# Patient Record
Sex: Female | Born: 1965 | Race: White | Hispanic: No | Smoking: Former smoker
Health system: Southern US, Community
[De-identification: ages and names within clinical notes are randomized; demographics above are authoritative.]

## PROBLEM LIST (undated history)

## (undated) DIAGNOSIS — Z5189 Encounter for other specified aftercare: Secondary | ICD-10-CM

## (undated) DIAGNOSIS — K7460 Unspecified cirrhosis of liver: Secondary | ICD-10-CM

## (undated) DIAGNOSIS — I4891 Unspecified atrial fibrillation: Secondary | ICD-10-CM

## (undated) DIAGNOSIS — E785 Hyperlipidemia, unspecified: Secondary | ICD-10-CM

## (undated) DIAGNOSIS — I499 Cardiac arrhythmia, unspecified: Secondary | ICD-10-CM

## (undated) DIAGNOSIS — D508 Other iron deficiency anemias: Secondary | ICD-10-CM

## (undated) DIAGNOSIS — I719 Aortic aneurysm of unspecified site, without rupture: Secondary | ICD-10-CM

## (undated) DIAGNOSIS — C6920 Malignant neoplasm of unspecified retina: Secondary | ICD-10-CM

## (undated) DIAGNOSIS — K3184 Gastroparesis: Secondary | ICD-10-CM

## (undated) DIAGNOSIS — K754 Autoimmune hepatitis: Secondary | ICD-10-CM

## (undated) DIAGNOSIS — E063 Autoimmune thyroiditis: Secondary | ICD-10-CM

## (undated) DIAGNOSIS — E079 Disorder of thyroid, unspecified: Secondary | ICD-10-CM

## (undated) DIAGNOSIS — H359 Unspecified retinal disorder: Secondary | ICD-10-CM

## (undated) DIAGNOSIS — E039 Hypothyroidism, unspecified: Secondary | ICD-10-CM

## (undated) DIAGNOSIS — D539 Nutritional anemia, unspecified: Secondary | ICD-10-CM

## (undated) DIAGNOSIS — G8929 Other chronic pain: Secondary | ICD-10-CM

## (undated) DIAGNOSIS — M797 Fibromyalgia: Secondary | ICD-10-CM

## (undated) DIAGNOSIS — M549 Dorsalgia, unspecified: Secondary | ICD-10-CM

## (undated) DIAGNOSIS — M1990 Unspecified osteoarthritis, unspecified site: Secondary | ICD-10-CM

## (undated) DIAGNOSIS — K760 Fatty (change of) liver, not elsewhere classified: Secondary | ICD-10-CM

## (undated) DIAGNOSIS — I10 Essential (primary) hypertension: Secondary | ICD-10-CM

## (undated) DIAGNOSIS — H5440 Blindness, one eye, unspecified eye: Secondary | ICD-10-CM

## (undated) DIAGNOSIS — M2550 Pain in unspecified joint: Secondary | ICD-10-CM

## (undated) DIAGNOSIS — A4901 Methicillin susceptible Staphylococcus aureus infection, unspecified site: Secondary | ICD-10-CM

## (undated) DIAGNOSIS — D510 Vitamin B12 deficiency anemia due to intrinsic factor deficiency: Secondary | ICD-10-CM

## (undated) DIAGNOSIS — S8990XA Unspecified injury of unspecified lower leg, initial encounter: Secondary | ICD-10-CM

## (undated) HISTORY — DX: Fatty (change of) liver, not elsewhere classified: K76.0

## (undated) HISTORY — DX: Autoimmune hepatitis: K75.4

## (undated) HISTORY — DX: Hyperlipidemia, unspecified: E78.5

## (undated) HISTORY — PX: COLONOSCOPY: SHX174

## (undated) HISTORY — DX: Aortic aneurysm of unspecified site, without rupture: I71.9

## (undated) HISTORY — DX: Unspecified cirrhosis of liver: K74.60

## (undated) HISTORY — PX: EYE SURGERY: SHX253

## (undated) HISTORY — DX: Unspecified atrial fibrillation: I48.91

## (undated) HISTORY — DX: Encounter for other specified aftercare: Z51.89

## (undated) HISTORY — DX: Autoimmune thyroiditis: E06.3

## (undated) HISTORY — DX: Malignant neoplasm of unspecified retina: C69.20

## (undated) HISTORY — DX: Unspecified injury of unspecified lower leg, initial encounter: S89.90XA

## (undated) HISTORY — PX: SPINE SURGERY: SHX786

## (undated) HISTORY — DX: Blindness, one eye, unspecified eye: H54.40

## (undated) HISTORY — DX: Vitamin B12 deficiency anemia due to intrinsic factor deficiency: D51.0

## (undated) HISTORY — DX: Essential (primary) hypertension: I10

## (undated) HISTORY — DX: Disorder of thyroid, unspecified: E07.9

## (undated) HISTORY — DX: Unspecified osteoarthritis, unspecified site: M19.90

## (undated) HISTORY — DX: Gastroparesis: K31.84

## (undated) HISTORY — PX: ESOPHAGOGASTRODUODENOSCOPY: SHX1529

## (undated) HISTORY — PX: LIVER BIOPSY: SHX301

## (undated) HISTORY — DX: Other iron deficiency anemias: D50.8

## (undated) HISTORY — DX: Nutritional anemia, unspecified: D53.9

## (undated) HISTORY — DX: Methicillin susceptible Staphylococcus aureus infection, unspecified site: A49.01

## (undated) HISTORY — DX: Cardiac arrhythmia, unspecified: I49.9

---
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
## 1984-01-14 HISTORY — PX: TUBAL LIGATION: SHX77

## 1987-01-14 HISTORY — PX: CHOLECYSTECTOMY: SHX55

## 1990-01-13 HISTORY — PX: DIAGNOSTIC LAPAROSCOPY: SUR761

## 1991-01-14 HISTORY — PX: EXPLORATORY LAPAROTOMY: SUR591

## 1991-01-14 HISTORY — PX: OTHER SURGICAL HISTORY: SHX169

## 1998-12-08 ENCOUNTER — Emergency Department (HOSPITAL_COMMUNITY): Admission: EM | Admit: 1998-12-08 | Discharge: 1998-12-08 | Payer: Self-pay | Admitting: Emergency Medicine

## 1998-12-08 ENCOUNTER — Encounter: Payer: Self-pay | Admitting: Emergency Medicine

## 1999-11-11 ENCOUNTER — Emergency Department (HOSPITAL_COMMUNITY): Admission: EM | Admit: 1999-11-11 | Discharge: 1999-11-11 | Payer: Self-pay | Admitting: Emergency Medicine

## 1999-12-02 ENCOUNTER — Ambulatory Visit (HOSPITAL_COMMUNITY): Admission: RE | Admit: 1999-12-02 | Discharge: 1999-12-02 | Payer: Self-pay | Admitting: Family Medicine

## 1999-12-02 ENCOUNTER — Encounter: Payer: Self-pay | Admitting: Family Medicine

## 2001-08-03 ENCOUNTER — Ambulatory Visit (HOSPITAL_COMMUNITY): Admission: RE | Admit: 2001-08-03 | Discharge: 2001-08-03 | Payer: Self-pay | Admitting: Family Medicine

## 2001-08-03 ENCOUNTER — Encounter: Payer: Self-pay | Admitting: Family Medicine

## 2001-08-30 ENCOUNTER — Ambulatory Visit (HOSPITAL_COMMUNITY): Admission: RE | Admit: 2001-08-30 | Discharge: 2001-08-30 | Payer: Self-pay | Admitting: Family Medicine

## 2001-08-30 ENCOUNTER — Encounter: Payer: Self-pay | Admitting: Family Medicine

## 2001-09-27 ENCOUNTER — Ambulatory Visit (HOSPITAL_COMMUNITY): Admission: RE | Admit: 2001-09-27 | Discharge: 2001-09-27 | Payer: Self-pay | Admitting: *Deleted

## 2001-09-27 ENCOUNTER — Encounter: Payer: Self-pay | Admitting: *Deleted

## 2001-11-10 ENCOUNTER — Ambulatory Visit (HOSPITAL_COMMUNITY): Admission: RE | Admit: 2001-11-10 | Discharge: 2001-11-10 | Payer: Self-pay | Admitting: Family Medicine

## 2001-11-10 ENCOUNTER — Encounter: Payer: Self-pay | Admitting: Family Medicine

## 2002-01-30 ENCOUNTER — Inpatient Hospital Stay (HOSPITAL_COMMUNITY): Admission: AD | Admit: 2002-01-30 | Discharge: 2002-01-30 | Payer: Self-pay

## 2002-02-04 ENCOUNTER — Ambulatory Visit (HOSPITAL_COMMUNITY): Admission: RE | Admit: 2002-02-04 | Discharge: 2002-02-04 | Payer: Self-pay

## 2003-11-23 ENCOUNTER — Emergency Department (HOSPITAL_COMMUNITY): Admission: EM | Admit: 2003-11-23 | Discharge: 2003-11-23 | Payer: Self-pay | Admitting: Emergency Medicine

## 2003-12-14 ENCOUNTER — Encounter: Admission: RE | Admit: 2003-12-14 | Discharge: 2003-12-14 | Payer: Self-pay | Admitting: Orthopedic Surgery

## 2004-01-14 DIAGNOSIS — A4901 Methicillin susceptible Staphylococcus aureus infection, unspecified site: Secondary | ICD-10-CM

## 2004-01-14 DIAGNOSIS — S8990XA Unspecified injury of unspecified lower leg, initial encounter: Secondary | ICD-10-CM | POA: Insufficient documentation

## 2004-01-14 HISTORY — PX: CARDIAC CATHETERIZATION: SHX172

## 2004-01-14 HISTORY — DX: Methicillin susceptible Staphylococcus aureus infection, unspecified site: A49.01

## 2004-01-14 HISTORY — DX: Unspecified injury of unspecified lower leg, initial encounter: S89.90XA

## 2004-01-20 ENCOUNTER — Encounter: Admission: RE | Admit: 2004-01-20 | Discharge: 2004-01-20 | Payer: Self-pay | Admitting: Neurosurgery

## 2004-01-31 ENCOUNTER — Encounter: Admission: RE | Admit: 2004-01-31 | Discharge: 2004-01-31 | Payer: Self-pay | Admitting: Gastroenterology

## 2004-01-31 ENCOUNTER — Encounter: Payer: Self-pay | Admitting: Family

## 2004-02-01 ENCOUNTER — Encounter (INDEPENDENT_AMBULATORY_CARE_PROVIDER_SITE_OTHER): Payer: Self-pay | Admitting: *Deleted

## 2004-02-01 ENCOUNTER — Ambulatory Visit (HOSPITAL_COMMUNITY): Admission: RE | Admit: 2004-02-01 | Discharge: 2004-02-01 | Payer: Self-pay | Admitting: Gastroenterology

## 2004-02-08 ENCOUNTER — Ambulatory Visit (HOSPITAL_COMMUNITY): Admission: RE | Admit: 2004-02-08 | Discharge: 2004-02-08 | Payer: Self-pay | Admitting: Gastroenterology

## 2004-02-08 ENCOUNTER — Encounter (INDEPENDENT_AMBULATORY_CARE_PROVIDER_SITE_OTHER): Payer: Self-pay | Admitting: Specialist

## 2004-02-12 ENCOUNTER — Inpatient Hospital Stay (HOSPITAL_COMMUNITY): Admission: RE | Admit: 2004-02-12 | Discharge: 2004-02-16 | Payer: Self-pay | Admitting: Neurosurgery

## 2004-03-12 ENCOUNTER — Ambulatory Visit: Payer: Self-pay | Admitting: Infectious Diseases

## 2004-03-12 ENCOUNTER — Inpatient Hospital Stay (HOSPITAL_COMMUNITY): Admission: AD | Admit: 2004-03-12 | Discharge: 2004-03-15 | Payer: Self-pay | Admitting: Neurosurgery

## 2004-03-17 ENCOUNTER — Emergency Department (HOSPITAL_COMMUNITY): Admission: EM | Admit: 2004-03-17 | Discharge: 2004-03-17 | Payer: Self-pay | Admitting: Emergency Medicine

## 2004-05-14 ENCOUNTER — Encounter: Admission: RE | Admit: 2004-05-14 | Discharge: 2004-05-14 | Payer: Self-pay | Admitting: Gastroenterology

## 2004-09-03 ENCOUNTER — Ambulatory Visit (HOSPITAL_COMMUNITY): Admission: RE | Admit: 2004-09-03 | Discharge: 2004-09-03 | Payer: Self-pay | Admitting: Neurosurgery

## 2004-09-13 ENCOUNTER — Ambulatory Visit (HOSPITAL_COMMUNITY): Admission: RE | Admit: 2004-09-13 | Discharge: 2004-09-13 | Payer: Self-pay | Admitting: Neurosurgery

## 2004-10-22 ENCOUNTER — Ambulatory Visit (HOSPITAL_COMMUNITY): Admission: RE | Admit: 2004-10-22 | Discharge: 2004-10-22 | Payer: Self-pay | Admitting: Neurosurgery

## 2004-12-02 ENCOUNTER — Encounter: Admission: RE | Admit: 2004-12-02 | Discharge: 2004-12-02 | Payer: Self-pay | Admitting: Orthopedic Surgery

## 2005-01-27 ENCOUNTER — Encounter: Payer: Self-pay | Admitting: Family

## 2005-02-25 ENCOUNTER — Ambulatory Visit (HOSPITAL_COMMUNITY): Admission: RE | Admit: 2005-02-25 | Discharge: 2005-02-25 | Payer: Self-pay | Admitting: Gastroenterology

## 2005-02-25 ENCOUNTER — Encounter (INDEPENDENT_AMBULATORY_CARE_PROVIDER_SITE_OTHER): Payer: Self-pay | Admitting: Specialist

## 2005-05-01 ENCOUNTER — Encounter: Payer: Self-pay | Admitting: Family

## 2005-05-01 ENCOUNTER — Observation Stay (HOSPITAL_COMMUNITY): Admission: EM | Admit: 2005-05-01 | Discharge: 2005-05-03 | Payer: Self-pay | Admitting: Gastroenterology

## 2005-05-03 ENCOUNTER — Encounter (INDEPENDENT_AMBULATORY_CARE_PROVIDER_SITE_OTHER): Payer: Self-pay | Admitting: Cardiology

## 2005-05-15 ENCOUNTER — Ambulatory Visit (HOSPITAL_COMMUNITY): Admission: RE | Admit: 2005-05-15 | Discharge: 2005-05-15 | Payer: Self-pay | Admitting: Gastroenterology

## 2005-06-02 ENCOUNTER — Encounter: Admission: RE | Admit: 2005-06-02 | Discharge: 2005-06-02 | Payer: Self-pay | Admitting: Gastroenterology

## 2005-07-13 HISTORY — PX: CARDIAC CATHETERIZATION: SHX172

## 2005-09-12 ENCOUNTER — Emergency Department (HOSPITAL_COMMUNITY): Admission: EM | Admit: 2005-09-12 | Discharge: 2005-09-12 | Payer: Self-pay | Admitting: Emergency Medicine

## 2006-02-10 ENCOUNTER — Encounter: Admission: RE | Admit: 2006-02-10 | Discharge: 2006-02-10 | Payer: Self-pay | Admitting: Neurosurgery

## 2006-06-30 LAB — HM COLONOSCOPY: HM Colonoscopy: NORMAL

## 2006-07-18 ENCOUNTER — Emergency Department (HOSPITAL_COMMUNITY): Admission: EM | Admit: 2006-07-18 | Discharge: 2006-07-19 | Payer: Self-pay | Admitting: Emergency Medicine

## 2006-09-16 ENCOUNTER — Encounter: Payer: Self-pay | Admitting: Family

## 2006-09-16 LAB — CONVERTED CEMR LAB: Pap Smear: NORMAL

## 2006-09-24 ENCOUNTER — Encounter: Admission: RE | Admit: 2006-09-24 | Discharge: 2006-09-24 | Payer: Self-pay | Admitting: Neurosurgery

## 2006-12-20 ENCOUNTER — Emergency Department (HOSPITAL_COMMUNITY): Admission: EM | Admit: 2006-12-20 | Discharge: 2006-12-21 | Payer: Self-pay | Admitting: Emergency Medicine

## 2006-12-23 ENCOUNTER — Ambulatory Visit: Payer: Self-pay | Admitting: Hematology & Oncology

## 2007-03-12 ENCOUNTER — Ambulatory Visit: Payer: Self-pay | Admitting: Hematology & Oncology

## 2007-03-12 LAB — CBC & DIFF AND RETIC
BASO%: 0.3 % (ref 0.0–2.0)
Basophils Absolute: 0 10*3/uL (ref 0.0–0.1)
EOS%: 3.8 % (ref 0.0–7.0)
Eosinophils Absolute: 0.2 10*3/uL (ref 0.0–0.5)
HCT: 30.2 % — ABNORMAL LOW (ref 34.8–46.6)
HGB: 9.6 g/dL — ABNORMAL LOW (ref 11.6–15.9)
IRF: 0.3 (ref 0.130–0.330)
LYMPH%: 20.7 % (ref 14.0–48.0)
MCH: 22.7 pg — ABNORMAL LOW (ref 26.0–34.0)
MCHC: 31.9 g/dL — ABNORMAL LOW (ref 32.0–36.0)
MCV: 71.1 fL — ABNORMAL LOW (ref 81.0–101.0)
MONO#: 0.4 10*3/uL (ref 0.1–0.9)
MONO%: 6.7 % (ref 0.0–13.0)
NEUT#: 4.3 10*3/uL (ref 1.5–6.5)
NEUT%: 68.5 % (ref 39.6–76.8)
Platelets: 244 10*3/uL (ref 145–400)
RBC: 4.25 10*6/uL (ref 3.70–5.32)
RDW: 16.7 % — ABNORMAL HIGH (ref 11.3–14.5)
RETIC #: 93.1 10*3/uL (ref 19.7–115.1)
Retic %: 2.2 % (ref 0.4–2.3)
WBC: 6.2 10*3/uL (ref 3.9–10.0)
lymph#: 1.3 10*3/uL (ref 0.9–3.3)

## 2007-03-12 LAB — CHCC SMEAR

## 2007-03-16 LAB — COMPREHENSIVE METABOLIC PANEL
ALT: 28 U/L (ref 0–35)
AST: 31 U/L (ref 0–37)
Albumin: 3.6 g/dL (ref 3.5–5.2)
Alkaline Phosphatase: 106 U/L (ref 39–117)
BUN: 9 mg/dL (ref 6–23)
CO2: 26 mEq/L (ref 19–32)
Calcium: 8.7 mg/dL (ref 8.4–10.5)
Chloride: 99 mEq/L (ref 96–112)
Creatinine, Ser: 0.58 mg/dL (ref 0.40–1.20)
Glucose, Bld: 222 mg/dL — ABNORMAL HIGH (ref 70–99)
Potassium: 4.5 mEq/L (ref 3.5–5.3)
Sodium: 141 mEq/L (ref 135–145)
Total Bilirubin: 0.3 mg/dL (ref 0.3–1.2)
Total Protein: 6.7 g/dL (ref 6.0–8.3)

## 2007-03-16 LAB — FERRITIN: Ferritin: 4 ng/mL — ABNORMAL LOW (ref 10–291)

## 2007-03-16 LAB — TRANSFERRIN RECEPTOR, SOLUABLE: Transferrin Receptor, Soluble: 39.8 nmol/L

## 2007-03-18 ENCOUNTER — Encounter: Admission: RE | Admit: 2007-03-18 | Discharge: 2007-03-18 | Payer: Self-pay | Admitting: Neurosurgery

## 2007-04-19 ENCOUNTER — Ambulatory Visit: Payer: Self-pay | Admitting: Hematology & Oncology

## 2007-04-21 LAB — CBC & DIFF AND RETIC
BASO%: 0.5 % (ref 0.0–2.0)
Basophils Absolute: 0 10*3/uL (ref 0.0–0.1)
EOS%: 4.4 % (ref 0.0–7.0)
Eosinophils Absolute: 0.3 10*3/uL (ref 0.0–0.5)
HCT: 36.4 % (ref 34.8–46.6)
HGB: 12 g/dL (ref 11.6–15.9)
IRF: 0.42 — ABNORMAL HIGH (ref 0.130–0.330)
LYMPH%: 30.8 % (ref 14.0–48.0)
MCH: 25.4 pg — ABNORMAL LOW (ref 26.0–34.0)
MCHC: 33 g/dL (ref 32.0–36.0)
MCV: 77 fL — ABNORMAL LOW (ref 81.0–101.0)
MONO#: 0.4 10*3/uL (ref 0.1–0.9)
MONO%: 5.8 % (ref 0.0–13.0)
NEUT#: 4.2 10*3/uL (ref 1.5–6.5)
NEUT%: 58.5 % (ref 39.6–76.8)
Platelets: 199 10*3/uL (ref 145–400)
RBC: 4.73 10*6/uL (ref 3.70–5.32)
RDW: 23.6 % — ABNORMAL HIGH (ref 11.3–14.5)
RETIC #: 109.7 10*3/uL (ref 19.7–115.1)
Retic %: 2.3 % (ref 0.4–2.3)
WBC: 7.2 10*3/uL (ref 3.9–10.0)
lymph#: 2.2 10*3/uL (ref 0.9–3.3)

## 2007-04-21 LAB — CHCC SMEAR

## 2007-04-23 LAB — HEPATIC FUNCTION PANEL
ALT: 37 U/L — ABNORMAL HIGH (ref 0–35)
AST: 35 U/L (ref 0–37)
Albumin: 4 g/dL (ref 3.5–5.2)
Alkaline Phosphatase: 104 U/L (ref 39–117)
Bilirubin, Direct: 0.1 mg/dL (ref 0.0–0.3)
Indirect Bilirubin: 0.4 mg/dL (ref 0.0–0.9)
Total Bilirubin: 0.5 mg/dL (ref 0.3–1.2)
Total Protein: 7.2 g/dL (ref 6.0–8.3)

## 2007-04-23 LAB — TSH: TSH: 2.459 u[IU]/mL (ref 0.350–5.500)

## 2007-04-23 LAB — TRANSFERRIN RECEPTOR, SOLUABLE: Transferrin Receptor, Soluble: 27.4 nmol/L

## 2007-04-23 LAB — FERRITIN: Ferritin: 234 ng/mL (ref 10–291)

## 2007-06-08 ENCOUNTER — Ambulatory Visit: Payer: Self-pay | Admitting: Hematology & Oncology

## 2007-06-10 LAB — CBC & DIFF AND RETIC
BASO%: 0.4 % (ref 0.0–2.0)
Basophils Absolute: 0 10*3/uL (ref 0.0–0.1)
EOS%: 3.7 % (ref 0.0–7.0)
Eosinophils Absolute: 0.2 10*3/uL (ref 0.0–0.5)
HCT: 41.1 % (ref 34.8–46.6)
HGB: 13.7 g/dL (ref 11.6–15.9)
IRF: 0.33 (ref 0.130–0.330)
LYMPH%: 29.7 % (ref 14.0–48.0)
MCH: 26.9 pg (ref 26.0–34.0)
MCHC: 33.3 g/dL (ref 32.0–36.0)
MCV: 80.8 fL — ABNORMAL LOW (ref 81.0–101.0)
MONO#: 0.5 10*3/uL (ref 0.1–0.9)
MONO%: 7.7 % (ref 0.0–13.0)
NEUT#: 3.9 10*3/uL (ref 1.5–6.5)
NEUT%: 58.5 % (ref 39.6–76.8)
Platelets: 239 10*3/uL (ref 145–400)
RBC: 5.08 10*6/uL (ref 3.70–5.32)
RDW: 17.4 % — ABNORMAL HIGH (ref 11.3–14.5)
RETIC #: 90.4 10*3/uL (ref 19.7–115.1)
Retic %: 1.8 % (ref 0.4–2.3)
WBC: 6.7 10*3/uL (ref 3.9–10.0)
lymph#: 2 10*3/uL (ref 0.9–3.3)

## 2007-06-10 LAB — CHCC SMEAR

## 2007-06-12 LAB — TRANSFERRIN RECEPTOR, SOLUABLE: Transferrin Receptor, Soluble: 18.3 nmol/L

## 2007-06-12 LAB — FERRITIN: Ferritin: 159 ng/mL (ref 10–291)

## 2007-08-26 ENCOUNTER — Encounter: Admission: RE | Admit: 2007-08-26 | Discharge: 2007-08-26 | Payer: Self-pay | Admitting: Family Medicine

## 2007-10-07 ENCOUNTER — Ambulatory Visit: Payer: Self-pay | Admitting: Hematology & Oncology

## 2007-10-08 LAB — CBC WITH DIFFERENTIAL (CANCER CENTER ONLY)
BASO#: 0.1 10*3/uL (ref 0.0–0.2)
BASO%: 0.8 % (ref 0.0–2.0)
EOS%: 4.4 % (ref 0.0–7.0)
Eosinophils Absolute: 0.3 10*3/uL (ref 0.0–0.5)
HCT: 39.2 % (ref 34.8–46.6)
HGB: 13.2 g/dL (ref 11.6–15.9)
LYMPH#: 2.3 10*3/uL (ref 0.9–3.3)
LYMPH%: 31.3 % (ref 14.0–48.0)
MCH: 27.1 pg (ref 26.0–34.0)
MCHC: 33.6 g/dL (ref 32.0–36.0)
MCV: 80 fL — ABNORMAL LOW (ref 81–101)
MONO#: 0.3 10*3/uL (ref 0.1–0.9)
MONO%: 3.6 % (ref 0.0–13.0)
NEUT#: 4.3 10*3/uL (ref 1.5–6.5)
NEUT%: 59.9 % (ref 39.6–80.0)
Platelets: 238 10*3/uL (ref 145–400)
RBC: 4.88 10*6/uL (ref 3.70–5.32)
RDW: 14.1 % (ref 10.5–14.6)
WBC: 7.3 10*3/uL (ref 3.9–10.0)

## 2007-10-08 LAB — CHCC SATELLITE - SMEAR

## 2007-10-11 LAB — HEPATIC FUNCTION PANEL
ALT: 32 U/L (ref 0–35)
AST: 37 U/L (ref 0–37)
Albumin: 3.8 g/dL (ref 3.5–5.2)
Alkaline Phosphatase: 110 U/L (ref 39–117)
Bilirubin, Direct: 0.1 mg/dL (ref 0.0–0.3)
Indirect Bilirubin: 0.3 mg/dL (ref 0.0–0.9)
Total Bilirubin: 0.4 mg/dL (ref 0.3–1.2)
Total Protein: 6.8 g/dL (ref 6.0–8.3)

## 2007-10-11 LAB — RETICULOCYTES (CHCC)
ABS Retic: 59.5 10*3/uL (ref 19.0–186.0)
RBC.: 4.96 MIL/uL (ref 3.87–5.11)
Retic Ct Pct: 1.2 % (ref 0.4–3.1)

## 2007-10-11 LAB — TRANSFERRIN RECEPTOR, SOLUABLE: Transferrin Receptor, Soluble: 18.5 nmol/L

## 2007-10-11 LAB — FERRITIN: Ferritin: 55 ng/mL (ref 10–291)

## 2007-10-11 LAB — TSH: TSH: 1.493 u[IU]/mL (ref 0.350–4.500)

## 2007-10-29 ENCOUNTER — Encounter: Payer: Self-pay | Admitting: Family

## 2007-10-29 ENCOUNTER — Encounter: Admission: RE | Admit: 2007-10-29 | Discharge: 2007-10-29 | Payer: Self-pay | Admitting: Gastroenterology

## 2007-11-11 ENCOUNTER — Encounter: Admission: RE | Admit: 2007-11-11 | Discharge: 2007-11-11 | Payer: Self-pay | Admitting: Gastroenterology

## 2007-12-15 ENCOUNTER — Ambulatory Visit: Payer: Self-pay | Admitting: Hematology & Oncology

## 2008-01-14 HISTORY — PX: BREAST CYST EXCISION: SHX579

## 2008-01-20 LAB — CBC WITH DIFFERENTIAL (CANCER CENTER ONLY)
BASO#: 0 10*3/uL (ref 0.0–0.2)
BASO%: 0.6 % (ref 0.0–2.0)
EOS%: 3.7 % (ref 0.0–7.0)
Eosinophils Absolute: 0.3 10*3/uL (ref 0.0–0.5)
HCT: 40.1 % (ref 34.8–46.6)
HGB: 13.4 g/dL (ref 11.6–15.9)
LYMPH#: 2.2 10*3/uL (ref 0.9–3.3)
LYMPH%: 29 % (ref 14.0–48.0)
MCH: 27.6 pg (ref 26.0–34.0)
MCHC: 33.4 g/dL (ref 32.0–36.0)
MCV: 83 fL (ref 81–101)
MONO#: 0.4 10*3/uL (ref 0.1–0.9)
MONO%: 5.2 % (ref 0.0–13.0)
NEUT#: 4.6 10*3/uL (ref 1.5–6.5)
NEUT%: 61.5 % (ref 39.6–80.0)
Platelets: 256 10*3/uL (ref 145–400)
RBC: 4.86 10*6/uL (ref 3.70–5.32)
RDW: 13.4 % (ref 10.5–14.6)
WBC: 7.5 10*3/uL (ref 3.9–10.0)

## 2008-01-20 LAB — COMPREHENSIVE METABOLIC PANEL
ALT: 30 U/L (ref 0–35)
AST: 31 U/L (ref 0–37)
Albumin: 3.8 g/dL (ref 3.5–5.2)
Alkaline Phosphatase: 101 U/L (ref 39–117)
BUN: 7 mg/dL (ref 6–23)
CO2: 28 mEq/L (ref 19–32)
Calcium: 8.8 mg/dL (ref 8.4–10.5)
Chloride: 104 mEq/L (ref 96–112)
Creatinine, Ser: 0.61 mg/dL (ref 0.40–1.20)
Glucose, Bld: 147 mg/dL — ABNORMAL HIGH (ref 70–99)
Potassium: 4 mEq/L (ref 3.5–5.3)
Sodium: 140 mEq/L (ref 135–145)
Total Bilirubin: 0.4 mg/dL (ref 0.3–1.2)
Total Protein: 6.6 g/dL (ref 6.0–8.3)

## 2008-01-20 LAB — FERRITIN: Ferritin: 15 ng/mL (ref 10–291)

## 2008-01-20 LAB — CHCC SATELLITE - SMEAR

## 2008-03-29 ENCOUNTER — Ambulatory Visit: Payer: Self-pay | Admitting: Hematology & Oncology

## 2008-04-20 LAB — CHCC SATELLITE - SMEAR

## 2008-04-20 LAB — CBC WITH DIFFERENTIAL (CANCER CENTER ONLY)
BASO#: 0 10*3/uL (ref 0.0–0.2)
BASO%: 0.4 % (ref 0.0–2.0)
EOS%: 4.9 % (ref 0.0–7.0)
Eosinophils Absolute: 0.3 10*3/uL (ref 0.0–0.5)
HCT: 39.4 % (ref 34.8–46.6)
HGB: 12.8 g/dL (ref 11.6–15.9)
LYMPH#: 1.7 10*3/uL (ref 0.9–3.3)
LYMPH%: 25.7 % (ref 14.0–48.0)
MCH: 25.8 pg — ABNORMAL LOW (ref 26.0–34.0)
MCHC: 32.4 g/dL (ref 32.0–36.0)
MCV: 80 fL — ABNORMAL LOW (ref 81–101)
MONO#: 0.4 10*3/uL (ref 0.1–0.9)
MONO%: 5.3 % (ref 0.0–13.0)
NEUT#: 4.2 10*3/uL (ref 1.5–6.5)
NEUT%: 63.7 % (ref 39.6–80.0)
Platelets: 271 10*3/uL (ref 145–400)
RBC: 4.93 10*6/uL (ref 3.70–5.32)
RDW: 13.3 % (ref 10.5–14.6)
WBC: 6.6 10*3/uL (ref 3.9–10.0)

## 2008-04-20 LAB — RETICULOCYTES (CHCC)
ABS Retic: 61.5 10*3/uL (ref 19.0–186.0)
RBC.: 4.73 MIL/uL (ref 3.87–5.11)
Retic Ct Pct: 1.3 % (ref 0.4–3.1)

## 2008-04-20 LAB — FERRITIN: Ferritin: 9 ng/mL — ABNORMAL LOW (ref 10–291)

## 2008-06-06 ENCOUNTER — Encounter: Admission: RE | Admit: 2008-06-06 | Discharge: 2008-06-06 | Payer: Self-pay | Admitting: General Surgery

## 2008-06-08 ENCOUNTER — Encounter (INDEPENDENT_AMBULATORY_CARE_PROVIDER_SITE_OTHER): Payer: Self-pay | Admitting: General Surgery

## 2008-06-08 ENCOUNTER — Ambulatory Visit (HOSPITAL_COMMUNITY): Admission: RE | Admit: 2008-06-08 | Discharge: 2008-06-08 | Payer: Self-pay | Admitting: General Surgery

## 2008-07-19 ENCOUNTER — Ambulatory Visit: Payer: Self-pay | Admitting: Hematology & Oncology

## 2008-07-20 LAB — CBC WITH DIFFERENTIAL (CANCER CENTER ONLY)
BASO#: 0 10*3/uL (ref 0.0–0.2)
BASO%: 0.5 % (ref 0.0–2.0)
EOS%: 5 % (ref 0.0–7.0)
Eosinophils Absolute: 0.4 10*3/uL (ref 0.0–0.5)
HCT: 41.6 % (ref 34.8–46.6)
HGB: 13.5 g/dL (ref 11.6–15.9)
LYMPH#: 2.6 10*3/uL (ref 0.9–3.3)
LYMPH%: 31.3 % (ref 14.0–48.0)
MCH: 27.5 pg (ref 26.0–34.0)
MCHC: 32.5 g/dL (ref 32.0–36.0)
MCV: 85 fL (ref 81–101)
MONO#: 0.5 10*3/uL (ref 0.1–0.9)
MONO%: 5.7 % (ref 0.0–13.0)
NEUT#: 4.8 10*3/uL (ref 1.5–6.5)
NEUT%: 57.5 % (ref 39.6–80.0)
Platelets: 248 10*3/uL (ref 145–400)
RBC: 4.9 10*6/uL (ref 3.70–5.32)
RDW: 14.1 % (ref 10.5–14.6)
WBC: 8.4 10*3/uL (ref 3.9–10.0)

## 2008-07-20 LAB — CHCC SATELLITE - SMEAR

## 2008-07-23 LAB — FERRITIN: Ferritin: 40 ng/mL (ref 10–291)

## 2008-07-23 LAB — TRANSFERRIN RECEPTOR, SOLUABLE: Transferrin Receptor, Soluble: 14.6 nmol/L

## 2008-09-20 ENCOUNTER — Encounter: Payer: Self-pay | Admitting: Family

## 2008-10-17 ENCOUNTER — Ambulatory Visit: Payer: Self-pay | Admitting: Hematology & Oncology

## 2008-11-30 ENCOUNTER — Ambulatory Visit: Payer: Self-pay | Admitting: Hematology & Oncology

## 2009-02-20 ENCOUNTER — Ambulatory Visit: Payer: Self-pay | Admitting: Hematology & Oncology

## 2009-03-27 LAB — HM MAMMOGRAPHY: HM Mammogram: NORMAL

## 2009-08-02 ENCOUNTER — Ambulatory Visit: Payer: Self-pay | Admitting: Hematology & Oncology

## 2009-08-03 ENCOUNTER — Encounter: Payer: Self-pay | Admitting: Family

## 2009-08-09 ENCOUNTER — Encounter: Payer: Self-pay | Admitting: Family

## 2009-08-09 LAB — CBC WITH DIFFERENTIAL (CANCER CENTER ONLY)
BASO#: 0 10*3/uL (ref 0.0–0.2)
BASO%: 0.6 % (ref 0.0–2.0)
EOS%: 5.1 % (ref 0.0–7.0)
Eosinophils Absolute: 0.3 10*3/uL (ref 0.0–0.5)
HCT: 33.8 % — ABNORMAL LOW (ref 34.8–46.6)
HGB: 11.1 g/dL — ABNORMAL LOW (ref 11.6–15.9)
LYMPH#: 2.1 10*3/uL (ref 0.9–3.3)
LYMPH%: 35.6 % (ref 14.0–48.0)
MCH: 24.8 pg — ABNORMAL LOW (ref 26.0–34.0)
MCHC: 32.8 g/dL (ref 32.0–36.0)
MCV: 75 fL — ABNORMAL LOW (ref 81–101)
MONO#: 0.3 10*3/uL (ref 0.1–0.9)
MONO%: 5.3 % (ref 0.0–13.0)
NEUT#: 3.1 10*3/uL (ref 1.5–6.5)
NEUT%: 53.4 % (ref 39.6–80.0)
Platelets: 225 10*3/uL (ref 145–400)
RBC: 4.48 10*6/uL (ref 3.70–5.32)
RDW: 14.5 % (ref 10.5–14.6)
WBC: 5.8 10*3/uL (ref 3.9–10.0)

## 2009-08-09 LAB — BASIC METABOLIC PANEL
BUN: 8 mg/dL (ref 6–23)
CO2: 28 mEq/L (ref 19–32)
Calcium: 8.8 mg/dL (ref 8.4–10.5)
Chloride: 101 mEq/L (ref 96–112)
Creatinine, Ser: 0.64 mg/dL (ref 0.40–1.20)
Glucose, Bld: 107 mg/dL — ABNORMAL HIGH (ref 70–99)
Potassium: 4.2 mEq/L (ref 3.5–5.3)
Sodium: 138 mEq/L (ref 135–145)

## 2009-08-09 LAB — RETICULOCYTES (CHCC)
ABS Retic: 39.9 10*3/uL (ref 19.0–186.0)
RBC.: 4.43 MIL/uL (ref 3.87–5.11)
Retic Ct Pct: 0.9 % (ref 0.4–3.1)

## 2009-08-09 LAB — CHCC SATELLITE - SMEAR

## 2009-08-09 LAB — FERRITIN: Ferritin: 8 ng/mL — ABNORMAL LOW (ref 10–291)

## 2009-08-31 ENCOUNTER — Ambulatory Visit: Payer: Self-pay | Admitting: Family

## 2009-08-31 DIAGNOSIS — Z8719 Personal history of other diseases of the digestive system: Secondary | ICD-10-CM | POA: Insufficient documentation

## 2009-08-31 DIAGNOSIS — Z8679 Personal history of other diseases of the circulatory system: Secondary | ICD-10-CM

## 2009-08-31 DIAGNOSIS — I10 Essential (primary) hypertension: Secondary | ICD-10-CM | POA: Insufficient documentation

## 2009-08-31 DIAGNOSIS — M797 Fibromyalgia: Secondary | ICD-10-CM | POA: Insufficient documentation

## 2009-08-31 DIAGNOSIS — Z85840 Personal history of malignant neoplasm of eye: Secondary | ICD-10-CM | POA: Insufficient documentation

## 2009-08-31 DIAGNOSIS — C6920 Malignant neoplasm of unspecified retina: Secondary | ICD-10-CM

## 2009-08-31 DIAGNOSIS — R519 Headache, unspecified: Secondary | ICD-10-CM | POA: Insufficient documentation

## 2009-08-31 DIAGNOSIS — K754 Autoimmune hepatitis: Secondary | ICD-10-CM | POA: Insufficient documentation

## 2009-08-31 DIAGNOSIS — M539 Dorsopathy, unspecified: Secondary | ICD-10-CM | POA: Insufficient documentation

## 2009-08-31 DIAGNOSIS — K3184 Gastroparesis: Secondary | ICD-10-CM | POA: Insufficient documentation

## 2009-08-31 DIAGNOSIS — IMO0001 Reserved for inherently not codable concepts without codable children: Secondary | ICD-10-CM

## 2009-08-31 DIAGNOSIS — E785 Hyperlipidemia, unspecified: Secondary | ICD-10-CM | POA: Insufficient documentation

## 2009-08-31 DIAGNOSIS — R739 Hyperglycemia, unspecified: Secondary | ICD-10-CM | POA: Insufficient documentation

## 2009-08-31 DIAGNOSIS — D649 Anemia, unspecified: Secondary | ICD-10-CM | POA: Insufficient documentation

## 2009-08-31 DIAGNOSIS — M1990 Unspecified osteoarthritis, unspecified site: Secondary | ICD-10-CM

## 2009-08-31 DIAGNOSIS — R51 Headache: Secondary | ICD-10-CM | POA: Insufficient documentation

## 2009-08-31 DIAGNOSIS — E039 Hypothyroidism, unspecified: Secondary | ICD-10-CM | POA: Insufficient documentation

## 2009-08-31 HISTORY — DX: Anemia, unspecified: D64.9

## 2009-08-31 HISTORY — DX: Hyperlipidemia, unspecified: E78.5

## 2009-08-31 HISTORY — DX: Malignant neoplasm of unspecified retina: C69.20

## 2009-08-31 HISTORY — DX: Essential (primary) hypertension: I10

## 2009-08-31 HISTORY — DX: Hyperglycemia, unspecified: R73.9

## 2009-08-31 HISTORY — DX: Personal history of other diseases of the circulatory system: Z86.79

## 2009-08-31 HISTORY — DX: Unspecified osteoarthritis, unspecified site: M19.90

## 2009-08-31 LAB — CONVERTED CEMR LAB
ALT: 27 units/L (ref 0–35)
AST: 35 units/L (ref 0–37)
Albumin: 3.8 g/dL (ref 3.5–5.2)
Alkaline Phosphatase: 79 units/L (ref 39–117)
BUN: 7 mg/dL (ref 6–23)
Basophils Absolute: 0 10*3/uL (ref 0.0–0.1)
Basophils Relative: 0 % (ref 0–1)
Bilirubin, Direct: 0.1 mg/dL (ref 0.0–0.3)
CO2: 24 meq/L (ref 19–32)
Calcium: 8.8 mg/dL (ref 8.4–10.5)
Chloride: 104 meq/L (ref 96–112)
Creatinine, Ser: 0.62 mg/dL (ref 0.40–1.20)
Eosinophils Absolute: 0.4 10*3/uL (ref 0.0–0.7)
Eosinophils Relative: 5 % (ref 0–5)
Folate: 13.1 ng/mL
Glucose, Bld: 72 mg/dL (ref 70–99)
HCT: 35.4 % — ABNORMAL LOW (ref 36.0–46.0)
Hemoglobin: 11.4 g/dL — ABNORMAL LOW (ref 12.0–15.0)
Hgb A1c MFr Bld: 6 % — ABNORMAL HIGH (ref <5.7)
Indirect Bilirubin: 0.3 mg/dL (ref 0.0–0.9)
Lymphocytes Relative: 29 % (ref 12–46)
Lymphs Abs: 2.4 10*3/uL (ref 0.7–4.0)
MCHC: 32.2 g/dL (ref 30.0–36.0)
MCV: 76.8 fL — ABNORMAL LOW (ref 78.0–100.0)
Monocytes Absolute: 0.5 10*3/uL (ref 0.1–1.0)
Monocytes Relative: 6 % (ref 3–12)
Neutro Abs: 4.9 10*3/uL (ref 1.7–7.7)
Neutrophils Relative %: 61 % (ref 43–77)
Platelets: 205 10*3/uL (ref 150–400)
Potassium: 4.5 meq/L (ref 3.5–5.3)
RBC: 4.61 M/uL (ref 3.87–5.11)
RDW: 16.1 % — ABNORMAL HIGH (ref 11.5–15.5)
Sodium: 139 meq/L (ref 135–145)
TSH: 1.771 microintl units/mL (ref 0.350–4.500)
Total Bilirubin: 0.4 mg/dL (ref 0.3–1.2)
Total Protein: 6.8 g/dL (ref 6.0–8.3)
Vitamin B-12: 241 pg/mL (ref 211–911)
WBC: 8.1 10*3/uL (ref 4.0–10.5)

## 2009-09-03 ENCOUNTER — Telehealth: Payer: Self-pay | Admitting: Family

## 2009-09-03 ENCOUNTER — Encounter: Payer: Self-pay | Admitting: Family

## 2009-09-03 LAB — CONVERTED CEMR LAB
Ferritin: 194 ng/mL (ref 10–291)
Iron: 72 ug/dL (ref 42–145)
Saturation Ratios: 20 % (ref 20–55)
TIBC: 357 ug/dL (ref 250–470)
UIBC: 285 ug/dL

## 2009-09-04 ENCOUNTER — Encounter: Payer: Self-pay | Admitting: Family

## 2009-09-11 ENCOUNTER — Encounter: Payer: Self-pay | Admitting: Family

## 2009-09-11 DIAGNOSIS — Z9884 Bariatric surgery status: Secondary | ICD-10-CM

## 2009-09-11 HISTORY — DX: Bariatric surgery status: Z98.84

## 2009-09-13 ENCOUNTER — Telehealth: Payer: Self-pay | Admitting: Family

## 2009-09-14 ENCOUNTER — Telehealth: Payer: Self-pay | Admitting: Family

## 2009-09-14 ENCOUNTER — Encounter: Payer: Self-pay | Admitting: Family

## 2009-09-26 ENCOUNTER — Ambulatory Visit: Payer: Self-pay | Admitting: Family

## 2009-09-26 ENCOUNTER — Telehealth: Payer: Self-pay | Admitting: Family

## 2009-09-26 LAB — CONVERTED CEMR LAB: Fecal Occult Bld: POSITIVE

## 2009-10-03 ENCOUNTER — Encounter: Payer: Self-pay | Admitting: Family

## 2009-10-10 ENCOUNTER — Telehealth: Payer: Self-pay | Admitting: Family

## 2009-10-10 ENCOUNTER — Encounter: Payer: Self-pay | Admitting: Family

## 2009-10-10 DIAGNOSIS — G894 Chronic pain syndrome: Secondary | ICD-10-CM

## 2009-10-10 HISTORY — DX: Chronic pain syndrome: G89.4

## 2009-10-12 ENCOUNTER — Ambulatory Visit (HOSPITAL_COMMUNITY): Admission: RE | Admit: 2009-10-12 | Discharge: 2009-10-12 | Payer: Self-pay | Admitting: Neurosurgery

## 2009-10-15 ENCOUNTER — Ambulatory Visit: Payer: Self-pay | Admitting: Family

## 2009-10-15 ENCOUNTER — Encounter (INDEPENDENT_AMBULATORY_CARE_PROVIDER_SITE_OTHER): Payer: Self-pay | Admitting: *Deleted

## 2009-10-15 ENCOUNTER — Telehealth: Payer: Self-pay | Admitting: Family

## 2009-10-15 DIAGNOSIS — K921 Melena: Secondary | ICD-10-CM

## 2009-10-15 HISTORY — DX: Melena: K92.1

## 2009-10-16 ENCOUNTER — Ambulatory Visit: Payer: Self-pay | Admitting: Family

## 2009-10-16 LAB — CONVERTED CEMR LAB
Basophils Absolute: 0 10*3/uL (ref 0.0–0.1)
Basophils Relative: 0 % (ref 0–1)
Bilirubin Urine: NEGATIVE
Eosinophils Absolute: 0.4 10*3/uL (ref 0.0–0.7)
Eosinophils Relative: 6 % — ABNORMAL HIGH (ref 0–5)
Glucose, Urine, Semiquant: NEGATIVE
HCT: 37.8 % (ref 36.0–46.0)
Hemoglobin: 12.5 g/dL (ref 12.0–15.0)
Ketones, urine, test strip: NEGATIVE
Lymphocytes Relative: 33 % (ref 12–46)
Lymphs Abs: 2.4 10*3/uL (ref 0.7–4.0)
MCHC: 33.1 g/dL (ref 30.0–36.0)
MCV: 80.6 fL (ref 78.0–100.0)
Monocytes Absolute: 0.4 10*3/uL (ref 0.1–1.0)
Monocytes Relative: 6 % (ref 3–12)
Neutro Abs: 4.1 10*3/uL (ref 1.7–7.7)
Neutrophils Relative %: 55 % (ref 43–77)
Nitrite: NEGATIVE
Platelets: 231 10*3/uL (ref 150–400)
Protein, U semiquant: NEGATIVE
RBC: 4.69 M/uL (ref 3.87–5.11)
RDW: 16.6 % — ABNORMAL HIGH (ref 11.5–15.5)
Specific Gravity, Urine: <1.005
TSH: 3.03 microintl units/mL (ref 0.350–4.500)
Urobilinogen, UA: 0.2
WBC: 7.4 10*3/uL (ref 4.0–10.5)
pH: 6.5

## 2009-10-17 ENCOUNTER — Encounter: Payer: Self-pay | Admitting: Family

## 2009-10-19 ENCOUNTER — Telehealth: Payer: Self-pay | Admitting: Family

## 2009-10-22 ENCOUNTER — Encounter: Payer: Self-pay | Admitting: Family

## 2009-10-25 ENCOUNTER — Telehealth: Payer: Self-pay | Admitting: Family

## 2009-10-26 ENCOUNTER — Ambulatory Visit: Payer: Self-pay | Admitting: Family

## 2009-10-26 DIAGNOSIS — L538 Other specified erythematous conditions: Secondary | ICD-10-CM | POA: Insufficient documentation

## 2009-10-26 DIAGNOSIS — M79609 Pain in unspecified limb: Secondary | ICD-10-CM | POA: Insufficient documentation

## 2009-10-26 DIAGNOSIS — R079 Chest pain, unspecified: Secondary | ICD-10-CM | POA: Insufficient documentation

## 2009-10-26 LAB — CONVERTED CEMR LAB
ALT: 22 units/L (ref 0–35)
AST: 35 units/L (ref 0–37)
Albumin: 3.9 g/dL (ref 3.5–5.2)
Alkaline Phosphatase: 81 units/L (ref 39–117)
Basophils Absolute: 0 10*3/uL (ref 0.0–0.1)
Basophils Relative: 0 % (ref 0–1)
Bilirubin Urine: NEGATIVE
Bilirubin, Direct: 0.1 mg/dL (ref 0.0–0.3)
Eosinophils Absolute: 0.3 10*3/uL (ref 0.0–0.7)
Eosinophils Relative: 4 % (ref 0–5)
Glucose, Urine, Semiquant: NEGATIVE
HCT: 38.4 % (ref 36.0–46.0)
Hemoglobin: 12.4 g/dL (ref 12.0–15.0)
Indirect Bilirubin: 0.3 mg/dL (ref 0.0–0.9)
Ketones, urine, test strip: NEGATIVE
Lymphocytes Relative: 34 % (ref 12–46)
Lymphs Abs: 2.3 10*3/uL (ref 0.7–4.0)
MCHC: 32.3 g/dL (ref 30.0–36.0)
MCV: 82.1 fL (ref 78.0–100.0)
Monocytes Absolute: 0.5 10*3/uL (ref 0.1–1.0)
Monocytes Relative: 7 % (ref 3–12)
Neutro Abs: 3.8 10*3/uL (ref 1.7–7.7)
Neutrophils Relative %: 55 % (ref 43–77)
Nitrite: NEGATIVE
Platelets: 221 10*3/uL (ref 150–400)
RBC: 4.68 M/uL (ref 3.87–5.11)
RDW: 16.6 % — ABNORMAL HIGH (ref 11.5–15.5)
Sed Rate: 4 mm/hr (ref 0–22)
Specific Gravity, Urine: 1.01
Total Bilirubin: 0.4 mg/dL (ref 0.3–1.2)
Total Protein: 6.6 g/dL (ref 6.0–8.3)
Urobilinogen, UA: 0.2
WBC: 6.8 10*3/uL (ref 4.0–10.5)
pH: 6

## 2009-10-27 ENCOUNTER — Encounter: Payer: Self-pay | Admitting: Family

## 2009-10-29 ENCOUNTER — Encounter: Payer: Self-pay | Admitting: Family

## 2009-10-31 ENCOUNTER — Telehealth: Payer: Self-pay | Admitting: Family

## 2009-11-14 ENCOUNTER — Ambulatory Visit: Payer: Self-pay | Admitting: Hematology & Oncology

## 2009-11-29 ENCOUNTER — Ambulatory Visit: Payer: Self-pay | Admitting: Diagnostic Radiology

## 2009-11-29 ENCOUNTER — Ambulatory Visit (HOSPITAL_BASED_OUTPATIENT_CLINIC_OR_DEPARTMENT_OTHER): Admission: RE | Admit: 2009-11-29 | Discharge: 2009-11-29 | Payer: Self-pay | Admitting: Hematology & Oncology

## 2009-11-29 LAB — CBC WITH DIFFERENTIAL (CANCER CENTER ONLY)
BASO#: 0 10*3/uL (ref 0.0–0.2)
BASO%: 0.5 % (ref 0.0–2.0)
EOS%: 5 % (ref 0.0–7.0)
Eosinophils Absolute: 0.3 10*3/uL (ref 0.0–0.5)
HCT: 39.2 % (ref 34.8–46.6)
HGB: 13.2 g/dL (ref 11.6–15.9)
LYMPH#: 2.3 10*3/uL (ref 0.9–3.3)
LYMPH%: 33.8 % (ref 14.0–48.0)
MCH: 27.8 pg (ref 26.0–34.0)
MCHC: 33.6 g/dL (ref 32.0–36.0)
MCV: 83 fL (ref 81–101)
MONO#: 0.4 10*3/uL (ref 0.1–0.9)
MONO%: 6.6 % (ref 0.0–13.0)
NEUT#: 3.6 10*3/uL (ref 1.5–6.5)
NEUT%: 54.1 % (ref 39.6–80.0)
Platelets: 215 10*3/uL (ref 145–400)
RBC: 4.74 10*6/uL (ref 3.70–5.32)
RDW: 13.6 % (ref 10.5–14.6)
WBC: 6.7 10*3/uL (ref 3.9–10.0)

## 2009-11-30 LAB — VITAMIN D 25 HYDROXY (VIT D DEFICIENCY, FRACTURES): Vit D, 25-Hydroxy: 44 ng/mL (ref 30–89)

## 2009-11-30 LAB — BASIC METABOLIC PANEL
BUN: 4 mg/dL — ABNORMAL LOW (ref 6–23)
CO2: 30 mEq/L (ref 19–32)
Calcium: 8.3 mg/dL — ABNORMAL LOW (ref 8.4–10.5)
Chloride: 103 mEq/L (ref 96–112)
Creatinine, Ser: 0.63 mg/dL (ref 0.40–1.20)
Glucose, Bld: 106 mg/dL — ABNORMAL HIGH (ref 70–99)
Potassium: 4.1 mEq/L (ref 3.5–5.3)
Sodium: 140 mEq/L (ref 135–145)

## 2009-11-30 LAB — IRON AND TIBC
%SAT: 13 % — ABNORMAL LOW (ref 20–55)
Iron: 46 ug/dL (ref 42–145)
TIBC: 354 ug/dL (ref 250–470)
UIBC: 308 ug/dL

## 2009-11-30 LAB — FERRITIN: Ferritin: 24 ng/mL (ref 10–291)

## 2009-12-03 ENCOUNTER — Encounter: Payer: Self-pay | Admitting: Internal Medicine

## 2010-01-01 ENCOUNTER — Ambulatory Visit: Payer: Self-pay | Admitting: Hematology & Oncology

## 2010-01-17 LAB — CBC WITH DIFFERENTIAL (CANCER CENTER ONLY)
BASO#: 0 10*3/uL (ref 0.0–0.2)
BASO%: 0.4 % (ref 0.0–2.0)
EOS%: 4.2 % (ref 0.0–7.0)
Eosinophils Absolute: 0.2 10*3/uL (ref 0.0–0.5)
HCT: 42 % (ref 34.8–46.6)
HGB: 13.9 g/dL (ref 11.6–15.9)
LYMPH#: 1.3 10*3/uL (ref 0.9–3.3)
LYMPH%: 22.2 % (ref 14.0–48.0)
MCH: 29 pg (ref 26.0–34.0)
MCHC: 33.2 g/dL (ref 32.0–36.0)
MCV: 88 fL (ref 81–101)
MONO#: 0.3 10*3/uL (ref 0.1–0.9)
MONO%: 5.4 % (ref 0.0–13.0)
NEUT#: 3.8 10*3/uL (ref 1.5–6.5)
NEUT%: 67.8 % (ref 39.6–80.0)
Platelets: 177 10*3/uL (ref 145–400)
RBC: 4.8 10*6/uL (ref 3.70–5.32)
RDW: 14.4 % (ref 10.5–14.6)
WBC: 5.7 10*3/uL (ref 3.9–10.0)

## 2010-01-17 LAB — FERRITIN: Ferritin: 142 ng/mL (ref 10–291)

## 2010-01-17 LAB — RETICULOCYTES (CHCC)
ABS Retic: 62 10*3/uL (ref 19.0–186.0)
RBC.: 4.77 MIL/uL (ref 3.87–5.11)
Retic Ct Pct: 1.3 % (ref 0.4–3.1)

## 2010-01-17 LAB — CHCC SATELLITE - SMEAR

## 2010-02-02 ENCOUNTER — Encounter: Payer: Self-pay | Admitting: Orthopedic Surgery

## 2010-02-02 ENCOUNTER — Encounter: Payer: Self-pay | Admitting: Neurosurgery

## 2010-02-03 ENCOUNTER — Encounter: Payer: Self-pay | Admitting: Gastroenterology

## 2010-02-03 ENCOUNTER — Encounter: Payer: Self-pay | Admitting: Neurosurgery

## 2010-02-04 ENCOUNTER — Encounter: Payer: Self-pay | Admitting: Neurosurgery

## 2010-02-12 NOTE — Progress Notes (Signed)
  Phone Note Outgoing Call   Call placed by: [name] FNP,  September 14, 2009 9:44 AM Call placed to: Patient Summary of Call: Called pt, she wishes to have C-spine evaluated by Dr. Lovell Sheehan.  Tells me that she will call his office to arrange a follow up appointment to further discuss with Dr. Lovell Sheehan.   Initial call taken by: [name] FNP,  September 14, 2009 9:45 AM

## 2010-02-12 NOTE — Letter (Signed)
Summary: Records Dated 12-12-03 thru 03-20-05/Stephen Lucey MD  Records Dated 12-12-03 thru 03-20-05/Stephen Lucey MD   Imported By: Lanelle Bal 10/12/2009 12:19:50  _____________________________________________________________________  External Attachment:    Type:   Image     Comment:   External Document

## 2010-02-12 NOTE — Miscellaneous (Signed)
   Clinical Lists Changes  Problems: Added new problem of BARIATRIC SURGERY STATUS (ICD-V45.86) 

## 2010-02-12 NOTE — Letter (Signed)
Summary: [facility]  [facility]   Imported By: Lanelle Bal 10/18/2009 07:55:13  _____________________________________________________________________  External Attachment:    Type:   Image     Comment:   External Document

## 2010-02-12 NOTE — Progress Notes (Signed)
Summary: records rec from [facility]   Phone Note Other Incoming   Caller: smoc Summary of Call: records received coded and given to [place]  Initial call taken by: Roselle Locus,  September 13, 2009 2:23 PM

## 2010-02-12 NOTE — Progress Notes (Signed)
Summary: lab results,  increased hand pain  Phone Note Outgoing Call   Summary of Call: Please call patient and let her know that I would like for her to start a B complex vitamin.  B12 level is on low side.  Please also let her know tht she is mildly anemic.  I would like for her to complete an IFOB please. Initial call taken by: [name] FNP,  September 03, 2009 4:38 PM  Follow-up for Phone Call        Left message with pt's mother to have pt return my call. Nicki Guadalajara Fergerson CMA Duncan Dull)  September 04, 2009 8:51 AM   Pt left voice message that I could leave detailed message on home phone or share information with her mother.  Called home number and gave info to her mother, Corrie Dandy and asked her to have pt call to arrange time to pick up IFOB kit.  Nicki Guadalajara Fergerson CMA Duncan Dull)  September 04, 2009 3:59 PM   Additional Follow-up for Phone Call Additional follow up Details #1::        Advised pt per [facility] instructions. Pt states that Dr Myna Hidalgo is currently treating her anemia and knows all about that. I have left voice message for Dr. Gustavo Lah office to fax most recent notes and labs.  Pt will come by today to pick up IFOB.  Pt also states that her hand pain is increasing and feels more intense. Please advise. Nicki Guadalajara Fergerson CMA Duncan Dull)  September 10, 2009 11:13 AM     Additional Follow-up for Phone Call Additional follow up Details #2::    I would like for her to complete an X-ray of her C-spine.  Please call pt and notify her.  [name] FNP,  September 10, 2009 12:08 PM  Attempted to contact pt. Unable to leave message. Nicki Guadalajara Fergerson CMA Duncan Dull)  September 10, 2009 12:15 PM   Notified pt of recommendation to have xray of Cspine and pt tells me that she has already had MRIs and is going to have a myelogram on 10/12/09 with Dr Lovell Sheehan @ [facility].  She also states that we need to communicate with her other doctors so we will not repeat any tests. Pt states that she wants  Gwen Sarvis to know how bad her wrist pain has gotten and wants to know what to do for it?   I asked pt to call [facility] and request records be sent to [place]. She stated that she signed records release at her last appt. Pt will come by to pick up IFOB kit today.  I could not find signed release so I will have pt sign them when she picks up IFOB.  Nicki Guadalajara Fergerson CMA Duncan Dull)  September 11, 2009 10:20 AM   Additional Follow-up for Phone Call Additional follow up Details #3:: Details for Additional Follow-up Action Taken: Pt picked up IFOB kit and signed record's releases. Pt wanted to let Clarice Bonaventure know that her hands wake her up at night, they will be swollen but when she urinates she states she can feel the swelling leave her hands.  Also notes she has had a rash on her hands and back today that seemed some better this afternoon. I asked pt if she wanted to let Paddy Walthall see her while she was here but she declined and stated she just wanted Aziah Kaiser to be aware.  Nicki Guadalajara Fergerson CMA Duncan Dull)  September 11, 2009 5:17 PM   New/Updated Medications: B COMPLEX  TABS (B COMPLEX VITAMINS) one tablet by mouth daily  Appended Document: lab results,  increased hand pain Tried twice today to reach patient- got message that voicemail box had not been set up.  Appended Document: lab results,  increased hand pain tried to call patient again.  Received message- voice mail box has not been set up yet.

## 2010-02-12 NOTE — Miscellaneous (Signed)
  Clinical Lists Changes  Problems: Added new problem of CHRONIC PAIN SYNDROME (ICD-338.4) - Follows with Dr. Vear Clock - Signed
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
## 2010-02-12 NOTE — Progress Notes (Signed)
  Phone Note Outgoing Call   Call placed by: [name] FNP,  September 26, 2009 4:27 PM Call placed to: Patient Summary of Call: Called patient in regards to Heme + stool.  She tells me that she has had heme + stool in the past and that Dr. Elnoria Howard manages this.  Requests that this info be passed along to dr's hung and Ennever.  Initial call taken by: [name] FNP,  September 26, 2009 4:33 PM

## 2010-02-12 NOTE — Letter (Signed)
Summary: Records Dated 01-30-04 thru 05-23-09/Patrick Elnoria Howard MD  Records Dated 01-30-04 thru 05-23-09/Patrick Elnoria Howard MD   Imported By: Lanelle Bal 10/23/2009 11:44:59  _____________________________________________________________________  External Attachment:    Type:   Image     Comment:   External Document

## 2010-02-12 NOTE — Letter (Signed)
Summary: Records Dated 05-29-05 thru 06-07-09/[facility]  Records Dated 05-29-05 thru 06-07-09/[facility]   Imported By: Lanelle Bal 10/23/2009 11:18:12  _____________________________________________________________________  External Attachment:    Type:   Image     Comment:   External Document

## 2010-02-12 NOTE — Progress Notes (Signed)
Summary: low BP reading / concerns  Phone Note Call from Patient Call back at [phone]   Caller: Patient Call For: [name] FNP Summary of Call: Pt left voice message that she had a myelogram on Friday. While there she states she was told her BP was low (94/43) and to call us. Pt upset and states that they could not finish the myelogram and told her it did not look good.  Spoke to pt and she wanted [place] to know that Dr Lovell Sheehan gave her a steroid shot that helped her hand tingling. Feels some better but states she still has a little weakness today. She is going to a couple of appts. today and will call us to see when she can make arrangements to be seen by [place] today.  Pt also states that no one has contacted her yet about a GI referral for her heme + stool.  Nicki Guadalajara Fergerson CMA Duncan Dull)  October 15, 2009 9:01 AM   Follow-up for Phone Call        Patient should be seen for OV.  I sent letter to Dr. Elnoria Howard informing him of heme + stool.  I am sending official referral since she has not heard back from him.  Follow-up by: [name] FNP,  October 15, 2009 9:44 AM  Additional Follow-up for Phone Call Additional follow up Details #1::        Pt walked in for nurse visit to check BP. See nurse visit note. Scheduled pt f/u with Weslynn Ke for 10/16/09 @ 3:15pm. Notified pt of GI referral to Dr Elnoria Howard. Nicki Guadalajara Fergerson CMA Duncan Dull)  October 15, 2009 4:59 PM   New Problems: HEMOCCULT POSITIVE STOOL (ICD-578.1)   New Problems: HEMOCCULT POSITIVE STOOL (ICD-578.1)

## 2010-02-12 NOTE — Letter (Signed)
Summary: Records Dated 05-15-08 thru 07-24-08/[facility]  Records Dated 05-15-08 thru 07-24-08/[facility]   Imported By: Lanelle Bal 10/12/2009 12:16:51  _____________________________________________________________________  External Attachment:    Type:   Image     Comment:   External Document

## 2010-02-12 NOTE — Miscellaneous (Signed)
  Clinical Lists Changes  Problems: Added new problem of FIBROMYALGIA (ICD-729.1)
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
## 2010-02-12 NOTE — Progress Notes (Signed)
Summary: Follow up appt  Phone Note Outgoing Call   Summary of Call: Please call patient and arrange a follow up appointment tomorrow for evaluation in the office.  Initial call taken by: [name] FNP,  October 25, 2009 1:11 PM  Follow-up for Phone Call        call placed to patient at [phone], routed through privacy director, no answer.  Voice recording stating partu was not available please try your call gain later. Follow-up by: Glendell Docker CMA,  October 26, 2009 8:57 AM  Additional Follow-up for Phone Call Additional follow up Details #1::        Pt has appt. @ 4:15 pm today. Nicki Guadalajara Fergerson CMA Duncan Dull)  October 26, 2009 2:48 PM

## 2010-02-12 NOTE — Letter (Signed)
Summary: Records Dated 01-16-04 thru 08-03-09/[facility]  Records Dated 01-16-04 thru 08-03-09/[facility]   Imported By: Lanelle Bal 10/23/2009 11:39:09  _____________________________________________________________________  External Attachment:    Type:   Image     Comment:   External Document

## 2010-02-12 NOTE — Letter (Signed)
   [place] at [facility] [address] [place], [place]  [address]  [place] Phone: [phone]      October 17, 2009   [facility] [address] [place], [place] [address]  RE:  LAB RESULTS  Dear  Ms. Dragos,  The following is an interpretation of your most recent lab tests.  Please take note of any instructions provided or changes to medications that have resulted from your lab work.  THYROID STUDIES:  Thyroid studies normal TSH: 3.030      CBC:  Good - no changes needed   Sincerely Yours,    [name] FNP  Appended Document:  Mailed.

## 2010-02-12 NOTE — Progress Notes (Signed)
  Phone Note Other Incoming   Caller: Dr. Tressie Stalker Details for Reason: returning my call Summary of Call: Late entry:  Spoke with Dr. Lovell Sheehan on 10/10 PM in regards to patient's hand paresthesias.   He will order further cervical imaging. Initial call taken by: [name] FNP,  October 31, 2009 8:27 AM

## 2010-02-12 NOTE — Letter (Signed)
Summary: Date Range:06-30-08 to 09-11-09/[facility]  Date Range:06-30-08 to 09-11-09/[facility]   Imported By: Sherian Rein 09/26/2009 13:49:23  _____________________________________________________________________  External Attachment:    Type:   Image     Comment:   External Document

## 2010-02-12 NOTE — Letter (Signed)
Summary: [facility]  [facility]   Imported By: Lanelle Bal 11/05/2009 11:33:13  _____________________________________________________________________  External Attachment:    Type:   Image     Comment:   External Document

## 2010-02-12 NOTE — Progress Notes (Signed)
Summary: concerns--LM 10-7, 10/10, 10/11, 10/12  Phone Note Call from Patient   Caller: Patient Call For: [name] FNP Summary of Call: Pt left voice message thanking you for your recent care. She states she took her last Cipro this a.m. and is feeling much better. Says she is still losing her hair, has no appetite and has chills at night. Please advise. Nicki Guadalajara Fergerson CMA Duncan Dull)  October 19, 2009 11:50 AM  Initial call taken by: Mervin Kung CMA Duncan Dull),  October 19, 2009 11:50 AM  Follow-up for Phone Call        Please let patient know that her thyroid looks good as well as her blood count- no obvious sign of infection.  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  She should call us if she develops fever over 101.  Poor appetite may be due to her gastroparesis- recommend small frequent meals.  Not clear why her hair is falling out.  This is sometimes caused by anemia or thyroid problems- these are both normal.  We can watch this.  Follow-up by: [name] FNP,  October 19, 2009 12:04 PM  Additional Follow-up for Phone Call Additional follow up Details #1::        Left message on machine to return my call. Nicki Guadalajara Fergerson CMA Duncan Dull)  October 19, 2009 2:51 PM   Attempted to reach pt, received message that party is unavailable and to try again later. Nicki Guadalajara Fergerson CMA Duncan Dull)  October 22, 2009 9:48 AM     Additional Follow-up for Phone Call Additional follow up Details #2::    Attempted to reach pt and received message that party is unavailable and to try again later. Nicki Guadalajara Fergerson CMA Duncan Dull)  October 22, 2009 4:37 PM   Left message on machine to return my call. Nicki Guadalajara Fergerson CMA Duncan Dull)  October 23, 2009 8:19 AM   Left message with pt's mother to have pt return my call. Nicki Guadalajara Fergerson CMA Duncan Dull)  October 24, 2009 2:43 PM    Additional Follow-up for Phone  Call Additional follow up Details #3:: Details for Additional Follow-up Action Taken: She has seen Dr Elnoria Howard and her colonoscopy is scheduled for Nov 8.  Because of her mother she could not do this till then.  Her hands have started swelling tight again.  They burn and swell once she goes to the bathroom they go down.  Please find out what the shot was that Dr Lovell Sheehan had her given at the hospital as that shot made the swelling go down  with in an hour.  Steriods is what they used to treat her autoimmune.  Where her liver is feels tight and it hurts.  She is  tender to the touch and she hurts to bend in that area.   Her liver feels swollen.  She will call Trish tomorrow am Roselle Locus  October 25, 2009 12:32 PM

## 2010-02-12 NOTE — Letter (Signed)
Summary: [facility]  [facility]   Imported By: Maryln Gottron 12/17/2009 11:29:13  _____________________________________________________________________  External Attachment:    Type:   Image     Comment:   External Document
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
## 2010-02-12 NOTE — Letter (Signed)
   [place] at [facility] [address] [place], [place]  [address]  [place] Phone: [phone]      October 29, 2009   [facility] [address] [place], [place] [address]  RE:  LAB RESULTS  Dear  Ms. Howse,  The following is an interpretation of your most recent lab tests.  Please take note of any instructions provided or changes to medications that have resulted from your lab work.  LIVER FUNCTION TESTS:  Good - no changes needed    CBC:  Good - no changes needed   Sincerely Yours,    [name] FNP  Appended Document:  Mailed.

## 2010-02-12 NOTE — Letter (Signed)
Summary: [facility]  [facility]   Imported By: Lanelle Bal 10/18/2009 07:55:57  _____________________________________________________________________  External Attachment:    Type:   Image     Comment:   External Document

## 2010-02-12 NOTE — Letter (Signed)
Summary: [facility]  [facility]   Imported By: Lanelle Bal 10/18/2009 07:57:04  _____________________________________________________________________  External Attachment:    Type:   Image     Comment:   External Document

## 2010-02-12 NOTE — Letter (Signed)
Summary: Records Dated 04-23-04 thru 04-25-08/[facility]  Records Dated 04-23-04 thru 04-25-08/[facility]   Imported By: Lanelle Bal 10/23/2009 11:10:15  _____________________________________________________________________  External Attachment:    Type:   Image     Comment:   External Document

## 2010-02-12 NOTE — Letter (Signed)
Summary: Generic Letter  [place] at [facility]  [address]   [place], [place] [address]   Phone: [phone]  Fax: [phone]    09/26/2009  Jeani Hawking [name] [address] [place], [place] [address]  Dear Dr. Elnoria Howard,  I am writing to you in regards to our mutual patient Ms. Megan [name] DOB 03/31/2065.  She recently established with me for primary care.  I found her to have a mild microcytic anemia- Hgb 11.4 and normal iron studies.  She was also found to be heme positive.  She reports that she has been heme positive in the past that you have performed endoscopy.  She asked that I make you aware of this finding.  I will defer any further work up to you.  Please feel free to call me if you have any further question or need further information.   Sincerely,   [name] FNP    CC:  Dr. Arlan Organ  Appended Document: Generic Letter mailed

## 2010-02-12 NOTE — Progress Notes (Signed)
Summary: Medical Records Chg  Phone Note Call from Patient Call back at Home Phone [phone]   Summary of Call: Pt got a bill from [employer] for records that were sent from [facility] to our office, pt states she does not have money to pay for medical records to be sent, pt request that we check with anywhere else that we have requested records to make sure they are not going to charge her Initial call taken by: Lannette Donath,  October 10, 2009 9:29 AM  Follow-up for Phone Call        The only group left to send records is [facility] and they verified they do not charge to send records to another physician office.  Left message with pt's mother to have pt return my call. Nicki Guadalajara Fergerson CMA Duncan Dull)  October 10, 2009 3:10 PM   Additional Follow-up for Phone Call Additional follow up Details #1::        Left message on machine to return my call. Nicki Guadalajara Fergerson CMA Duncan Dull)  October 11, 2009 9:58 AM   Unable to reach pt, did not leave message. Pt has not returned my previous calls. Nicki Guadalajara Fergerson CMA (AAMA)  October 12, 2009 11:30 AM

## 2010-02-12 NOTE — Letter (Signed)
Summary: Generic Letter  [place] at [facility]  [address]   [place], [place] [address]   Phone: [phone]  Fax: [phone]    09/14/2009  Dr Tressie Stalker [address] [place], [place] [address]  Dear Dr. Lovell Sheehan,  I am writing you in regards to Megan [name] (DOB 2065-08-29).  She recently established with me for her primary care. As I understand it, she has been following with you in regards to her chronic lower back pain and has undergone extensive work up.  She presented to me with complaint of bilateral hand/arm pain which I suspect may be due to cervical disc disease.  I recommended that she undergo an x-ray of her c-spine to evaluate, and she told me that she would like to defer this work up to you.  She tells me that she will call you to arrange a follow up appointment to further address this issue. Please call me if you have any further questions or concerns.               Sincerely,   Megan [name]

## 2010-02-12 NOTE — Miscellaneous (Signed)
Summary: pap smear  Clinical Lists Changes  Allergies: Added new allergy or adverse reaction of CODEINE Observations: Added new observation of PAP SMEAR: normal (09/16/2006 9:18)     Current Allergies: ! PCN ! MORPHINE ! CODEINE       Preventive Care Screening  Pap Smear:    Date:  09/16/2006    Results:  normal

## 2010-02-12 NOTE — Assessment & Plan Note (Signed)
Summary: BP CHECK/MHF  Nurse Visit   Vital Signs:  Patient profile:   45 year old female Menstrual status:  irregular Pulse rate:   72 / minute Pulse rhythm:   regular Resp:     16 per minute BP sitting:   118 / 74  (left arm) Cuff size:   large  Vitals Entered By: Mervin Kung CMA Duncan Dull) (October 15, 2009 4:59 PM) CC: Pt walked in for BP check. States she had an episode of feeling light headed earlier today but feels better now. Pt declines to see Efraim Kaufmann today stating she has another appt to go to. Pt scheduled appt. for tomorrow at 3:15pm. Efraim Kaufmann has been made aware. Pain Assessment Patient in pain? yes        Allergies: 1)  ! Pcn 2)  ! Morphine 3)  ! Codeine  Orders Added: 1)  Est. Patient Level I [57846]

## 2010-02-12 NOTE — Letter (Signed)
Summary: Primary Care Consult Scheduled Letter  [place] at [facility]  [address]   [place], [place] [address]   Phone: [phone]  Fax: [phone]      10/15/2009 MRN: [id number]  [facility] [address] [place], [place]  [address]    Dear Ms. Menees,      We have scheduled an appointment for you.  At the recommendation of MELISSA O'SULLIVAN,FNP, we have scheduled you a consult with DR [facility]  on OCTOBER 10,2011 at 4PM.  Their address is__[address], [place]  [address]. The office phone number is [phone].  If this appointment day and time is not convenient for you, please feel free to call the office of the doctor you are being referred to at the number listed above and reschedule the appointment.     It is important for you to keep your scheduled appointments. We are here to make sure you are given good patient care. If you have questions or you have made changes to your appointment, please notify us at  [phone], ask for HELEN.    Thank you,  Darral Dash Patient Care Coordinator [place] at [facility]

## 2010-02-12 NOTE — Letter (Signed)
Summary: [facility]: Immunoassay Fecal Occult Blood (iFOB) Order Psychologist, counselling at [facility]  [address]   [place], [place] [address]   Phone: [phone]  Fax: [phone]        [facility]: Immunoassay Fecal Occult Blood (iFOB) Order Form   September 04, 2009 MRN: [id number]   [facility] 1965-11-17   Physicican Name:__Melissa O'sullivan, NP_______________________  Diagnosis Code:_____285.9_____________________      Mervin Kung CMA (AAMA)

## 2010-02-12 NOTE — Letter (Signed)
Summary: Date Range:03-12-07 to 08-09-09/[facility]  Date Range:03-12-07 to 08-09-09/[facility]   Imported By: Sherian Rein 09/26/2009 13:53:52  _____________________________________________________________________  External Attachment:    Type:   Image     Comment:   External Document

## 2010-02-15 NOTE — Letter (Signed)
Summary: Records Dated 05-15-08 thru 07-24-08/[facility]  Records Dated 05-15-08 thru 07-24-08/[facility]   Imported By: Lanelle Bal 10/23/2009 10:58:32  _____________________________________________________________________  External Attachment:    Type:   Image     Comment:   External Document

## 2010-02-15 NOTE — Letter (Signed)
Summary: Date Range: 02-08-04 to 09-20-08/[facility] Lucianne Muss MD  Date Range: 02-08-04 to 09-20-08/[facility] Luc[facility] Muss MD   Imported By: Sherian Rein 10/03/2009 11:33:03  _____________________________________________________________________  External Attachment:    Type:   Image     Comment:   External Document

## 2010-03-07 ENCOUNTER — Other Ambulatory Visit: Payer: Self-pay | Admitting: Hematology & Oncology

## 2010-03-07 ENCOUNTER — Encounter (HOSPITAL_BASED_OUTPATIENT_CLINIC_OR_DEPARTMENT_OTHER): Payer: PRIVATE HEALTH INSURANCE | Admitting: Hematology & Oncology

## 2010-03-07 LAB — CBC WITH DIFFERENTIAL (CANCER CENTER ONLY)
BASO#: 0 10*3/uL (ref 0.0–0.2)
BASO%: 0.4 % (ref 0.0–2.0)
EOS%: 5.5 % (ref 0.0–7.0)
Eosinophils Absolute: 0.3 10*3/uL (ref 0.0–0.5)
HCT: 40.8 % (ref 34.8–46.6)
HGB: 13.8 g/dL (ref 11.6–15.9)
LYMPH#: 1.3 10*3/uL (ref 0.9–3.3)
LYMPH%: 26.7 % (ref 14.0–48.0)
MCH: 29.7 pg (ref 26.0–34.0)
MCHC: 34 g/dL (ref 32.0–36.0)
MCV: 88 fL (ref 81–101)
MONO#: 0.4 10*3/uL (ref 0.1–0.9)
MONO%: 7.5 % (ref 0.0–13.0)
NEUT#: 2.9 10*3/uL (ref 1.5–6.5)
NEUT%: 59.9 % (ref 39.6–80.0)
Platelets: 192 10*3/uL (ref 145–400)
RBC: 4.66 10*6/uL (ref 3.70–5.32)
RDW: 12 % (ref 10.5–14.6)
WBC: 4.9 10*3/uL (ref 3.9–10.0)

## 2010-03-07 LAB — IRON AND TIBC
%SAT: 36 % (ref 20–55)
Iron: 102 ug/dL (ref 42–145)
TIBC: 285 ug/dL (ref 250–470)
UIBC: 183 ug/dL

## 2010-03-07 LAB — RETICULOCYTES (CHCC)
ABS Retic: 46.8 10*3/uL (ref 19.0–186.0)
RBC.: 4.68 MIL/uL (ref 3.87–5.11)
Retic Ct Pct: 1 % (ref 0.4–3.1)

## 2010-03-07 LAB — CHCC SATELLITE - SMEAR

## 2010-03-07 LAB — FERRITIN: Ferritin: 138 ng/mL (ref 10–291)

## 2010-03-28 LAB — GLUCOSE, CAPILLARY
Glucose-Capillary: 109 mg/dL — ABNORMAL HIGH (ref 70–99)
Glucose-Capillary: 111 mg/dL — ABNORMAL HIGH (ref 70–99)

## 2010-04-17 ENCOUNTER — Other Ambulatory Visit: Payer: Self-pay | Admitting: Hematology & Oncology

## 2010-04-17 ENCOUNTER — Encounter (HOSPITAL_BASED_OUTPATIENT_CLINIC_OR_DEPARTMENT_OTHER): Payer: PRIVATE HEALTH INSURANCE | Admitting: Hematology & Oncology

## 2010-04-17 DIAGNOSIS — D509 Iron deficiency anemia, unspecified: Secondary | ICD-10-CM

## 2010-04-17 DIAGNOSIS — K754 Autoimmune hepatitis: Secondary | ICD-10-CM

## 2010-04-17 DIAGNOSIS — E039 Hypothyroidism, unspecified: Secondary | ICD-10-CM

## 2010-04-17 DIAGNOSIS — Z9884 Bariatric surgery status: Secondary | ICD-10-CM

## 2010-04-17 LAB — CBC WITH DIFFERENTIAL (CANCER CENTER ONLY)
BASO#: 0 10*3/uL (ref 0.0–0.2)
BASO%: 0.3 % (ref 0.0–2.0)
EOS%: 4.6 % (ref 0.0–7.0)
Eosinophils Absolute: 0.3 10*3/uL (ref 0.0–0.5)
HCT: 37.6 % (ref 34.8–46.6)
HGB: 12.9 g/dL (ref 11.6–15.9)
LYMPH#: 1.6 10*3/uL (ref 0.9–3.3)
LYMPH%: 26.3 % (ref 14.0–48.0)
MCH: 29.6 pg (ref 26.0–34.0)
MCHC: 34.3 g/dL (ref 32.0–36.0)
MCV: 86 fL (ref 81–101)
MONO#: 0.4 10*3/uL (ref 0.1–0.9)
MONO%: 7.2 % (ref 0.0–13.0)
NEUT#: 3.8 10*3/uL (ref 1.5–6.5)
NEUT%: 61.6 % (ref 39.6–80.0)
Platelets: 163 10*3/uL (ref 145–400)
RBC: 4.36 10*6/uL (ref 3.70–5.32)
RDW: 13.5 % (ref 11.1–15.7)
WBC: 6.1 10*3/uL (ref 3.9–10.0)

## 2010-04-17 LAB — CHCC SATELLITE - SMEAR

## 2010-04-18 LAB — RETICULOCYTES (CHCC)
ABS Retic: 62.7 10*3/uL (ref 19.0–186.0)
RBC.: 4.48 MIL/uL (ref 3.87–5.11)
Retic Ct Pct: 1.4 % (ref 0.4–3.1)

## 2010-04-18 LAB — IRON AND TIBC
%SAT: 22 % (ref 20–55)
Iron: 65 ug/dL (ref 42–145)
TIBC: 291 ug/dL (ref 250–470)
UIBC: 226 ug/dL

## 2010-04-18 LAB — FERRITIN: Ferritin: 114 ng/mL (ref 10–291)

## 2010-04-18 LAB — VITAMIN D 25 HYDROXY (VIT D DEFICIENCY, FRACTURES): Vit D, 25-Hydroxy: 34 ng/mL (ref 30–89)

## 2010-04-18 LAB — VITAMIN B12: Vitamin B-12: 359 pg/mL (ref 211–911)

## 2010-04-23 LAB — DIFFERENTIAL
Basophils Absolute: 0 10*3/uL (ref 0.0–0.1)
Basophils Relative: 0 % (ref 0–1)
Eosinophils Absolute: 0.3 10*3/uL (ref 0.0–0.7)
Eosinophils Relative: 3 % (ref 0–5)
Lymphocytes Relative: 25 % (ref 12–46)
Lymphs Abs: 2.5 10*3/uL (ref 0.7–4.0)
Monocytes Absolute: 0.6 10*3/uL (ref 0.1–1.0)
Monocytes Relative: 6 % (ref 3–12)
Neutro Abs: 6.3 10*3/uL (ref 1.7–7.7)
Neutrophils Relative %: 65 % (ref 43–77)

## 2010-04-23 LAB — CBC
HCT: 43.2 % (ref 36.0–46.0)
Hemoglobin: 14.5 g/dL (ref 12.0–15.0)
MCHC: 33.5 g/dL (ref 30.0–36.0)
MCV: 83.6 fL (ref 78.0–100.0)
Platelets: 252 10*3/uL (ref 150–400)
RBC: 5.17 MIL/uL — ABNORMAL HIGH (ref 3.87–5.11)
RDW: 19.5 % — ABNORMAL HIGH (ref 11.5–15.5)
WBC: 9.7 10*3/uL (ref 4.0–10.5)

## 2010-04-23 LAB — WOUND CULTURE: Gram Stain: NONE SEEN

## 2010-04-23 LAB — GLUCOSE, CAPILLARY
Glucose-Capillary: 106 mg/dL — ABNORMAL HIGH (ref 70–99)
Glucose-Capillary: 84 mg/dL (ref 70–99)

## 2010-04-23 LAB — COMPREHENSIVE METABOLIC PANEL
ALT: 32 U/L (ref 0–35)
AST: 39 U/L — ABNORMAL HIGH (ref 0–37)
Albumin: 3.6 g/dL (ref 3.5–5.2)
Alkaline Phosphatase: 80 U/L (ref 39–117)
BUN: 7 mg/dL (ref 6–23)
CO2: 30 mEq/L (ref 19–32)
Calcium: 8.8 mg/dL (ref 8.4–10.5)
Chloride: 104 mEq/L (ref 96–112)
Creatinine, Ser: 0.62 mg/dL (ref 0.4–1.2)
GFR calc Af Amer: >60 mL/min (ref >60)
GFR calc non Af Amer: >60 mL/min (ref >60)
Glucose, Bld: 107 mg/dL — ABNORMAL HIGH (ref 70–99)
Potassium: 4.2 mEq/L (ref 3.5–5.1)
Sodium: 137 mEq/L (ref 135–145)
Total Bilirubin: 0.8 mg/dL (ref 0.3–1.2)
Total Protein: 7 g/dL (ref 6.0–8.3)

## 2010-05-08 DIAGNOSIS — D509 Iron deficiency anemia, unspecified: Secondary | ICD-10-CM

## 2010-05-24 ENCOUNTER — Other Ambulatory Visit: Payer: Self-pay | Admitting: Hematology & Oncology

## 2010-05-24 ENCOUNTER — Encounter (HOSPITAL_BASED_OUTPATIENT_CLINIC_OR_DEPARTMENT_OTHER): Payer: PRIVATE HEALTH INSURANCE | Admitting: Hematology & Oncology

## 2010-05-24 DIAGNOSIS — D509 Iron deficiency anemia, unspecified: Secondary | ICD-10-CM

## 2010-05-24 LAB — CBC WITH DIFFERENTIAL (CANCER CENTER ONLY)
BASO#: 0 10*3/uL (ref 0.0–0.2)
BASO%: 0.3 % (ref 0.0–2.0)
EOS%: 5 % (ref 0.0–7.0)
Eosinophils Absolute: 0.4 10*3/uL (ref 0.0–0.5)
HCT: 37.7 % (ref 34.8–46.6)
HGB: 12.8 g/dL (ref 11.6–15.9)
LYMPH#: 2 10*3/uL (ref 0.9–3.3)
LYMPH%: 27.7 % (ref 14.0–48.0)
MCH: 29.4 pg (ref 26.0–34.0)
MCHC: 34 g/dL (ref 32.0–36.0)
MCV: 87 fL (ref 81–101)
MONO#: 0.6 10*3/uL (ref 0.1–0.9)
MONO%: 9.1 % (ref 0.0–13.0)
NEUT#: 4.1 10*3/uL (ref 1.5–6.5)
NEUT%: 57.9 % (ref 39.6–80.0)
Platelets: 156 10*3/uL (ref 145–400)
RBC: 4.35 10*6/uL (ref 3.70–5.32)
RDW: 13.4 % (ref 11.1–15.7)
WBC: 7.1 10*3/uL (ref 3.9–10.0)

## 2010-05-24 LAB — CHCC SATELLITE - SMEAR

## 2010-05-26 LAB — RETICULOCYTES (CHCC)
ABS Retic: 65 10*3/uL (ref 19.0–186.0)
RBC.: 4.33 MIL/uL (ref 3.87–5.11)
Retic Ct Pct: 1.5 % (ref 0.4–3.1)

## 2010-05-26 LAB — IRON AND TIBC
%SAT: 25 % (ref 20–55)
Iron: 69 ug/dL (ref 42–145)
TIBC: 275 ug/dL (ref 250–470)
UIBC: 206 ug/dL

## 2010-05-26 LAB — TRANSFERRIN RECEPTOR, SOLUABLE: Transferrin Receptor, Soluble: 0.79 mg/L (ref 0.76–1.76)

## 2010-05-26 LAB — FERRITIN: Ferritin: 86 ng/mL (ref 10–291)

## 2010-05-28 NOTE — Op Note (Signed)
NAMEFLOREINE, Megan [name]                 ACCOUNT NO.:  [id number]   MEDICAL RECORD NO.:  [id number]          PATIENT TYPE:  AMB   LOCATION:  SDS                          FACILITY:  [facility]   PHYSICIAN:  Adolph Pollack, M.D.DATE OF BIRTH:  10-26-1965   DATE OF PROCEDURE:  06/08/2008  DATE OF DISCHARGE:  06/08/2008                               OPERATIVE REPORT   PREOPERATIVE DIAGNOSIS:  Bilateral inflamed breast cyst.   POSTOPERATIVE DIAGNOSES:  1. Bilateral inflamed breast cyst.  2. Right breast abscess.   PROCEDURES:  1. Excision of inflamed left breast cyst.  2. Excision of infected right breasts cyst.   SURGEON:  Adolph Pollack, MD   ANESTHESIA:  General.   INDICATION:  This is a 45 year old female been having intermittent  drainage from cystic lesions on her right and left breast.  This is  sometimes its blood mixed with pus.  She had been on rounds of  antibiotics, but still has persistent drainage.  She now presents for  the above procedures.   TECHNIQUE:  She was seen in the holding area and brought to the  operating room, and placed supine on the operating table and General  anesthetic was administered.  Both breast were sterilely prepped and  draped.  The right breast had some fair amount of drainage coming from  that cystic area and I sent this for culture.  I then approached the  flamed cyst on the left breast.  Using scalpel, I made an elliptical  incision around it and staying outside of the capsule and carrying this  down to normal subcutaneous tissue.  I then sharply excised it.  Bleeding was controlled with electrocautery.  Local anesthesia  consisting of plain Marcaine was injected for local anesthetic effect.  I then loosely closed the wound with interrupted 4-0 Monocryl  subcuticular stitches.   Next, I approached the right breast and began milking purulent material  from the medial cystic lesion.  I then made an elliptical incision  around this area.   Using sharp dissection, debrided and indurated an  inflamed tissue and there was well an abscess cavity tracked superiorly.  I took this down sharply to help the normal tissue and then I sent this  to pathology.   I controlled bleeding with electrocautery.  Once hemostasis was  adequate, I packed this wound with saline moistened gauze.  Bulky dry  dressings were then applied to both wounds.   She tolerated the procedures well without any apparent complications and  was taken to recovery room in satisfactory condition.      Adolph Pollack, [name]  Electronically Signed     TJR/MEDQ  D:  06/08/2008  T:  06/09/2008  Job:  [id number]

## 2010-05-31 NOTE — Discharge Summary (Signed)
NAMEJORDIN, DAMBROSIO NO.:  [id number]   MEDICAL RECORD NO.:  [id number]          PATIENT TYPE:  INP   LOCATION:  3003                         FACILITY:  [facility]   PHYSICIAN:  [name], M.D.DATE OF BIRTH:  [birth year]   DATE OF ADMISSION:  03/12/2004  DATE OF DISCHARGE:  03/15/2004                                 DISCHARGE SUMMARY   BRIEF HISTORY:  The patient is a 45 year old white female who underwent  lumbar surgery about a month prior to this admission.  She developed a  superficial wound infection and is admitted for this.  For further details  of this admission, please refer to history and physical.   HOSPITAL COURSE:  The patient was admitted to [facility] on  March 04, 2004, with diagnosis of a wound infection.  The day of  admission, I performed incision and drainage of her wound.  At surgery, we  found a superficial wound infection.  We started her empirically on  vancomycin and Rocephin.  I asked infectious disease to see the patient and  she was seen by Dr. Enedina Finner.  The patient ended up growing sensitive  Staph aureus.  A PICC line was placed and the patient was sent home on  vancomycin 1.5 grams IV per pharmacy dosing for a total of 21 days then  change over to p.o. antibiotics.   FINAL DIAGNOSIS:  Wound infection.   PROCEDURE PERFORMED:  Incision and drainage of wound.   DISCHARGE INSTRUCTIONS:  The patient was instructed to follow up with me in  one week in the office and with Dr. Ninetta Lights per his instructions.  The  patient was discharged to home on March 15, 2004, with Home Health Care set  up.      JDJ/MEDQ  D:  05/02/2004  T:  05/02/2004  Job:  [id number]

## 2010-05-31 NOTE — Discharge Summary (Signed)
Megan [name], ETHERIDGE                 ACCOUNT NO.:  [id number]   MEDICAL RECORD NO.:  [id number]          PATIENT TYPE:  INP   LOCATION:  6710                         FACILITY:  [facility]   PHYSICIAN:  [name], MD    DATE OF BIRTH:  1965/02/21   DATE OF ADMISSION:  05/01/2005  DATE OF DISCHARGE:  05/03/2005                                 DISCHARGE SUMMARY   ADMISSION DIAGNOSES:  1.  Nausea and vomiting.  2.  Weakness.  3.  Dyspnea on exertion.   DISCHARGE DIAGNOSES:  Same.   HISTORY OF PRESENT ILLNESS:  Please see the admission history and physical  for full details.   HOSPITAL COURSE:  The patient was admitted on the evening of May 01, 2005  and underwent evaluation for her complaints of weakness, nausea, vomiting  and dyspnea on exertion.  A cardiology consult was obtained for the patient,  and the results are pending at this time in regards to the echo; however,  the initial laboratory values were negative for any evidence of myocardial  infarction or any evidence of heart failure.  Because of the nausea and  vomiting, it was felt that she required an EGD evaluation which was  performed on the same day, and this was negative for any abnormalities  within the upper gastrointestinal tract.  A gastric emptying scan was  ordered for the patient.  Unfortunately, this was not performed on the  weekends, and will be completed as an outpatient on an outpatient status.  The patient overall had no change in her symptoms and there is no evidence  of any acute cardiac symptomatology, and it was felt that she could be  discharged home.  Prior to discharge, she was checked for her cortisol level  in regards to her complaints of weakness, nausea and vomiting.  The patient  was previously treated with prednisone for her autoimmune hepatitis.  The  autoimmune hepatitis is essentially unchanged at this time despite treatment  with prednisone, and the decision was made to stop the prednisone  and the  Imuran because of the nausea and vomiting complaints.  The prednisone was  tapered over a time course before her admission to the hospital.  Because  the testing was negative, it was felt that she could be discharged home  safely.   PATIENT CONDITION:  Essentially unchanged.   FOLLOW UP:  The patient will follow-up with Dr. Elnoria Howard in the office.      [name] Elnoria Howard, MD  Electronically Signed     PDH/MEDQ  D:  05/07/2005  T:  05/07/2005  Job:  [id number]   cc:   Teena Irani. Arlyce Dice, [name]  Fax: [phone]   Reather Littler, [name]  Fax: [phone]   [name], [name]  Fax: [phone]   Mila Homer. Sherlean Foot, [name]  Fax: [phone]   Lemmie Evens, [name]  Fax: [phone]   Mark L. Vear Clock, [name]  Fax: [phone]

## 2010-05-31 NOTE — Op Note (Signed)
NAMESHERALD, BALBUENA NO.:  [id number]   MEDICAL RECORD NO.:  [id number]          PATIENT TYPE:  INP   LOCATION:  3014                         FACILITY:  [facility]   PHYSICIAN:  [name], M.D.DATE OF BIRTH:  08/16/1965   DATE OF PROCEDURE:  02/12/2004  DATE OF DISCHARGE:                                 OPERATIVE REPORT   BRIEF HISTORY:  The patient is a 45 year old white female who has suffered  from back and leg pain.  She has failed medical management and worked up  with a lumbar MRI which demonstrated that the patient had a grade 1  spondylolisthesis at L4-5 with severe spinal stenosis and degenerative disk  disease.  I discussed the various treatment options with the patient  including surgery; the patient has weighed the risks, benefits and  alternatives to surgery and decided to proceed with an L4-5 decompression  and fusion.   PREOPERATIVE DIAGNOSIS:  L4-5 grade 1 acquired spondylolisthesis, spinal  stenosis, degenerative disk disease, lumbar radiculopathy, lumbago.   POSTOPERATIVE DIAGNOSIS:  L4-5 grade 1 acquired spondylolisthesis, spinal  stenosis, degenerative disk disease, lumbar radiculopathy, lumbago.   PROCEDURE:  L4 Gill procedure; L4-5 posterior lumbar interbody fusion  procedure; insertion of bilateral L4-5 interbody prostheses; L4-5 posterior  nonsegmental instrumentation with Legacy titanium pedicle screws and rods;  L4-5 posterolateral arthrodesis with localized morcellized autograft bone  and Actifuse bone substitute.   SURGEON:  [name], [name]   ASSISTANT:  Stefani Dama, [name]   ANESTHESIA:  General endotracheal.   ESTIMATED BLOOD LOSS:  250 mL.   SPECIMENS:  None.   DRAINS:  None.   COMPLICATIONS:  None.   DESCRIPTION OF PROCEDURE:  The patient was brought to the operating room by  anesthesia team.  General endotracheal anesthesia was induced.  The patient  was turned to the prone position on the Wilson  frame; the lumbosacral region  was then prepared with Betadine scrub and Betadine solution.  Sterile drapes  were applied.  I then injected the area to be incised with Marcaine with  epinephrine solution.  I then used a scalpel to make a linear midline  incision over the L4-5 interspace.  I used electrocautery to perform a  bilateral subperiosteal dissection, exposing the spinous processes and  lamina of L3, 4 and 5.  We obtained intraoperative radiograph to confirm our  location.  We then inserted the Versatrac retractor for exposure.  We  incised the L3-4 and L4-5 interspinous ligament with a scalpel.  I then used  a Leksell rongeur to remove the L4 spinous process and part of the L4  lamina.  We shaved this bone and later cleared it of soft tissue and used it  in the fusion process.  We then used a high-speed drill to perform bilateral  L4 laminotomies.  We completed the L4 laminectomies/Gill procedure using a  Kerrison punch, removing the remainder of the L4 lamina as well as the  hypertrophied facet joints and there was quite a bit of stenosis laterally  and we performed a good foraminotomy about the bilateral  L4 and L5 nerve  roots, completing the decompression.   We then freed up the thecal sac and the L5 nerve roots of epidural tissue  and gently retracted it medially with the retractor, exposing the L4-5  intervertebral disk.  We incised the disk and performed an aggressively  diskectomy using the pituitary forceps, Epstein and Scoville curettes; we  did this bilaterally.   We then prepared the vertebral endplates for the posterior lumbar interbody  fusion procedure.  We inserted a vertebral body spreader into the  contralateral side.  We then cleared the ipsilateral disk space of soft  tissue using the curettes.  We then inserted a 12 x 26-mm Capstone PEEK cage  at the ipsilateral disk space after retracting neural structures out of  harm's way.  I should mention we prefilled  the cages with localized  morcellized autograft bone and Actifuse bone graft substitute.  We packed  medially in the disk space with localized morcellized autograft bone and  Actifuse, and then we removed the distractors from the contralateral side  and placed another 12 x 26-mm Capstone cage at the contralateral disk space  including the posterior lumbar interbody fusion.   We now turned our attention to the instrumentation.  We used electrocautery  to expose the bilateral transverse process of L4 and L5.  We then, under  fluoroscopic guidance, cannulated the bilateral L4 and L5 pedicles with bone  probes.  We tapped the pedicles and then probed inside the tapped pedicles  without cortical breeches.  We then placed 7.5 x 45.0-mm pedicle screws  bilaterally at L4 and L5 under fluoroscopic guidance.  We then probed out  along the medial aspect of the bilateral L4 and L5 pedicles and noted there  were no cortical breeches and that the L4 and L5 nerve roots were not  injured.  We then connected the ipsilateral pedicle screws with a lordotic  rod.  We then connected the rod and tightened it in place using the caps,  which were tightened appropriately.  We then completed the instrumentation.   We now turned our attention to the posterolateral arthrodesis.  We used the  high-speed drill to decorticate the remainder of the L4-5 facet joint and  pars region as well as the bilateral L4 and L5 transverse processes.  We  then laid a combination of local morcellized autograft bone and Actifuse  bone graft substitute over the decorticated structures including the  posterolateral arthrodesis.  We then obtain stringent hemostasis using  bipolar electrocautery.  We then carefully inspected the thecal sac and the  bilateral L4 and L5 nerve roots and they were well-decompressed.  We then copiously irrigated the wound out with bacitracin solution and removed the  solution, and then removed the Versatrac  retractor.  We then reapproximated  the patient's thoracolumbar fascia with interrupted #1 Vicryl suture,  subcutaneous tissue with interrupted 2-0 Vicryl suture and the skin with  Steri-Strips and Benzoin.  The wound was then coated with bacitracin  ointment and sterile dressings applied.  The drapes were removed and the  patient was subsequently returned to a supine position, where she was  extubated by the anesthesia team and transported to the postanesthesia care  unit in stable condition.  All sponge, instrument and needle counts were  correct at the end of the case.      JDJ/MEDQ  D:  02/12/2004  T:  02/13/2004  Job:  [id number]

## 2010-05-31 NOTE — Discharge Summary (Signed)
Megan [name], Megan [name] NO.:  [id number]   MEDICAL RECORD NO.:  [id number]          PATIENT TYPE:  INP   LOCATION:  3014                         FACILITY:  [facility]   PHYSICIAN:  [name], M.D.DATE OF BIRTH:  September 25, 1965   DATE OF ADMISSION:  02/12/2004  DATE OF DISCHARGE:  02/16/2004                                 DISCHARGE SUMMARY   BRIEF HISTORY:  The patient is a 45 year old white female who suffers from  back and leg pain.  She has failed medical management and was worked up with  a lumbar MRI which demonstrated the patient had a grade I spondylolisthesis  at L4-L5 with severe spinal stenosis and degenerative disk disease.  I  discussed the various treatment options with the patient including surgery,  the patient has weighed the risks, benefits and alternatives of surgery and  decided to proceed with an L4-L5 decompression and fusion.   For further details of this admission please refer to the history and  physical.   HOSPITAL COURSE:  I admitted the patient to [facility] on February 12, 2004 on the day of admission I performed an L4-L5 fusion.  The surgery  went well (for full details of this operation, please refer to the operative  note).   POSTOPERATIVE COURSE:  The patients postoperative course was unremarkable.  By postop day #4, she was afebrile, vital signs stable, she was eating well,  ambulating well, her wound was healing well without signs of infection, and  she wanted to go home, she was therefore discharged home on February 16, 2004.   DISCHARGE MEDICATIONS:  1.  Demerol 50 mg #100 1-2 p.o. q.4 hours p.r.n. for pain.  2.  Valium 5 mg #50 1 p.o. q.6 hours p.r.n. for muscle spasm.  3.  Phenergan 25 mg #30 1 p.o. q.6 hours p.r.n. for nausea.  4.  Ambien 10 mg #20 1 p.o. h.s. p.r.n. for insomnia.   DISCHARGE INSTRUCTIONS:  The patient was given written discharge  instructions and instructed to follow up with me in four weeks  and to resume  her outpatient medical management.   FINAL DIAGNOSES:  1.  L4-L5 grade acquired spondylolisthesis.  2.  Spinal stenosis.  3.  Degenerative disk disease.  4.  Lumbar radiculopathy.  5.  Lumbago.   PROCEDURE PERFORMED:  L4-L5 Gill procedure with L4-L5 posterior lumbar  interbody fusion; insertion of bilateral L4-L5 interbody prosthesis; L4-L5  posterior nonsegmented instrumentation with Legacy Titanium pedicle screws  and rods; L4-L5 posterior lateral arthrodesis with local morsilized  autograft bone and active fused bone extender.      JDJ/MEDQ  D:  04/15/2004  T:  04/15/2004  Job:  [id number]

## 2010-05-31 NOTE — Consult Note (Signed)
[name], [name] NO.:  [id number]   MEDICAL RECORD NO.:  [id number]          PATIENT TYPE:  EMS   LOCATION:  MAJO                         FACILITY:  [facility]   PHYSICIAN:  Coletta Memos, [name]     DATE OF BIRTH:  06-15-1965   DATE OF CONSULTATION:  03/17/2004  DATE OF DISCHARGE:                                   CONSULTATION   REFERRING PHYSICIAN:  Dr. Paula Libra.   TIME:  12:45   REASON FOR CONSULTATION:  Megan [name] is a patient of Dr. [name],  my partner.  He performed a lumbar fusion, February 12, 2004.  Postoperatively, the patient noticed some drainage, but did not realize that  that was not normal until she saw Dr. Lovell Sheehan in the office.  He evaluated  her and felt that she may have an infection.  He then took her back to the  operating room this past Tuesday, On March 12, 2004, if I am not  mistaken, for an incision and drainage.  He found only a superficial area of  purulence and he felt that it was more than likely due to fat necrosis, but  there was nothing subfascial which he could identify.  He therefore closed  the wound and she was sent home with IV vancomycin to be given by her  husband.  They had a home health nurse look at her today and she felt that  there was some drainage which was purulent.  They therefore called me and  she came to the emergency room for evaluation.  Her wound is clean; it is  dry.  There is nothing that I can express with force.  There is certainly  some moisture along the suture line and it is not yet well-healed, but there  is no induration or erythema surrounding the incision.  She is afebrile upon  coming to the emergency room.   I was able to speak with Dr. Lovell Sheehan about his intraoperative findings and  after that, I am very comfortable with having Megan [name] go home.  She had a  return appointment to see Dr. Lovell Sheehan this Tuesday.  She will remain on  antibiotics.  She certainly did not have anything  close to the point where I  would have felt she needed operative decompression today and that being the  case, I think she can be cared for just as easily at home.  She did not have  any fluctuance in the wound itself.  And again, she is afebrile.   ALLERGIES:  CODEINE, HYDROCODONE and PENICILLIN.   SOCIAL HISTORY:  She does not use illicit drugs.  No alcohol.  She lives  with her husband.   FAMILY HISTORY:  She has a family history of coronary artery disease,  diabetes, hemophilia, hypertension, cerebrovascular disease.   PAST MEDICAL HISTORY:  Medical history includes:  1.  A cholecystectomy.  2.  Hypothyroidism.  3.  Tubal ligation.   MEDICATIONS:  She currently is taking Valium, vancomycin, oxycodone,  promethazine and Synthroid.   PHYSICAL EXAMINATION:  VITAL SIGNS:  97.8,  blood pressure 122/83, pulse 91,  respiratory rate 18.  NEUROMUSCULAR:  On exam, she has full strength in the lower and upper  extremities.  She is walking well.   PLAN:  I will have Megan [name] go home.  She will be discharged from the  emergency room.  She has all the medications which she needs at home.  I  have spoken at length with her and her husband and they understand.  They  will keep their appointment with Dr. Lovell Sheehan this Tuesday.      KC/MEDQ  D:  03/17/2004  T:  03/18/2004  Job:  [id number]

## 2010-05-31 NOTE — H&P (Signed)
Megan [name], Megan [name]                 ACCOUNT NO.:  [id number]   MEDICAL RECORD NO.:  [id number]          PATIENT TYPE:  INP   LOCATION:  6710                         FACILITY:  [facility]   PHYSICIAN:  [name], MD    DATE OF BIRTH:  1965-07-31   DATE OF ADMISSION:  05/01/2005  DATE OF DISCHARGE:                                HISTORY & PHYSICAL   REASON FOR ADMISSION:  Weakness, dyspnea on exertion and nausea.   HISTORY OF PRESENT ILLNESS:  This is a 45 year old female with a past  medical history of morbid obesity, chronic back pain, status post back  surgery, auto-immune hepatitis, hypothyroidism, fibromyalgia and depression,  who is admitted for complaints of dyspnea on exertion and persistent nausea.  The patient states that this started approximately two months ago and has  been progressively worsening.  She states that her dyspnea on exertion  severely limits her activities of daily living and does exhaust her when she  attempts to complete her tasks.  It is to the point where she has difficulty  with driving to certain places and her husband has to provide the  transportation for her.  She denies any orthopnea or lower extremity  swelling or chest pain.  The patient does not have any known history of  coronary artery disease.  The patient does have a significant amount of  nausea, and this has been a major problem for her.  She requires using  Phenergan, up to 100 mg per dosing q.4h. in order to control her symptoms.  Apparently she started feeling ill several months ago, for unclear reasons.  I have been treating her for an auto-immune hepatitis, and the last biopsy  in February 2007, did not reveal any significant difference from the prior  biopsy one year ago.  She has been treated on prednisone and Imuran;  however, she does not appear to be tolerating Imuran at this time, and it  was felt two days prior to admission that she should stop all medications,  in regards to  the treatment of her auto-immune hepatitis.  Because of her  declining clinical status, I felt that it would be prudent to admit the  patient for further evaluation, and to request a cardiology consultation in  order to obtain their opinion with regards to the patient's symptoms.   PAST MEDICAL HISTORY:  As stated above.   PAST SURGICAL HISTORY:  As stated above.   FAMILY HISTORY:  Significant for auto-immune  hepatitis.  Her mother has  cirrhosis as a result.   SOCIAL HISTORY:  The patient is married and lives with her husband.  She is  not working at this time.   ALLERGIES:  CODEINE AND PENICILLIN.   CURRENT MEDICATIONS:  1.  Lactulose.  2.  Wellbutrin.  3.  Skelaxin.  4.  Oxycodone.  5.  Prednisone.  6.  Multivitamin.  7.  Ibuprofen.  8.  Synthroid.  9.  Diazepam.  10. Phenergan.   PHYSICAL EXAMINATION:  VITAL SIGNS:  Stable.  GENERAL:  The patient appears very weak and fatigued.  HEENT:  Normocephalic and atraumatic.  Extraocular muscles intact.  Pupils  equal, round, reactive to light.  NECK:  Supple, no lymphadenopathy.  LUNGS:  Clear to auscultation bilaterally.  CARDIOVASCULAR:  A regular rate and rhythm without murmurs, gallops or rubs.  ABDOMEN:  Soft, nontender, non-distended.  No hepatosplenomegaly.  Morbidly  obese.  EXTREMITIES:  No clubbing, cyanosis or edema.   LABORATORY DATA:  White blood cell count 4.7, hemoglobin 11.1, platelets  220.  Sodium 142, potassium 4, BUN 10, creatinine 0.6.   IMPRESSION:  1.  Nausea.  2.  Dyspnea on exertion.  3.  Auto-immune hepatitis.  4.  Morbid obesity.  5.  Hypothyroidism.  6.  Chronic back pain.   COMMENTS:  At this time I am uncertain of the cause of the patient's  weakness, fatigue, dyspnea on exertion and nausea.  I do know the patient  very well and there appears to be a marked decline from prior visits at this  time, therefore I felt it is necessary to admit the patient for further  evaluation.  I am  uncertain if there is a cardiac source, but with her  complaints of dyspnea on exertion, certainly this requires further  evaluation, and cardiology input will be much-appreciated.  Additionally,  because she has difficulty making appointments without her husband driving  her, I will perform further workup while she is in the hospital.   PLAN:  1.  At this time is to obtain a cardiology consultation.  2.  Perform an EGD to evaluate any GI causes for her nausea.  3.  To obtain a gastric emptying scan.  4.  To continue treatment for her back pain.  5.  I will try Zofran to help control her symptoms.      [name] Elnoria Howard, MD  Electronically Signed     PDH/MEDQ  D:  05/02/2005  T:  05/02/2005  Job:  [id number]   cc:   Teena Irani. Arlyce Dice, [name]  Fax: [phone]   Reather Littler, [name]  Fax: [phone]   [name], [name]  Fax: [phone]   Mila Homer. Sherlean Foot, [name]  Fax: [phone]   Lemmie Evens, [name]  Fax: [phone]   Mark L. Vear Clock, [name]  Fax: [phone]

## 2010-05-31 NOTE — Op Note (Signed)
[name], [name] NO.:  [id number]   MEDICAL RECORD NO.:  [id number]          PATIENT TYPE:  INP   LOCATION:  3003                         FACILITY:  [facility]   PHYSICIAN:  [name], M.D.DATE OF BIRTH:  [birth year]   DATE OF PROCEDURE:  03/12/2004  DATE OF DISCHARGE:                                 OPERATIVE REPORT   BRIEF HISTORY:  The patient is a 45 year old white female whom I performed  an L4-5 fusion on about a month ago.  She developed some purulent discharge,  and I recommended that she have an incision and drainage of her wound.  The  patient weighed the risks, benefits, and alternatives of surgery and decided  to proceed with that surgery.   PREOPERATIVE DIAGNOSIS:  Wound infection.   POSTOPERATIVE DIAGNOSIS:  Wound infection.   PROCEDURE:  Incision and drainage of wound.   SURGEON:  [name], [name]   ASSISTANT:  None.   ANESTHESIA:  General endotracheal.   ESTIMATED BLOOD LOSS:  Minimal.   SPECIMENS:  Cultures.   DRAINS:  None.   COMPLICATIONS:  None.   DESCRIPTION OF PROCEDURE:  The patient was brought to the operating room by  the anesthesia team.  General endotracheal anesthesia was induced.  The  patient was turned to the prone position on the Wilson frame.  Her  lumbosacral region was then prepared with Betadine scrub and Betadine  solution, and sterile drapes were applied.  I then used a scalpel to incise  through the area of dehiscence in the patient's wound.  I immediately  encountered a small amount of purulent material.  We took several cultures.  I then carefully dissected in the subcutaneous tissue.  The purulent  material did not tract any deeper.  It looked like there was some fat  necrosis there, but it seemed like a very superficial infection.  I then  washed out the wound with bacitracin solution.  I then obtained stringent  hemostasis using bipolar electrocautery.  I then reapproximated the  patient's  subcutaneous tissue with interrupted 2-0 Vicryl suture, the skin  with a running 3-0 nylon suture.  The wound was then coated with bacitracin  ointment.  A  sterile dressing was applied.  The drapes were removed.  The patient was  subsequently returned to the supine position, where she was extubated by the  anesthesia team and transported to the postanesthesia care unit in stable  condition.  All sponge, instrument, and needle counts were correct at the  end of this case.      JDJ/MEDQ  D:  03/12/2004  T:  03/13/2004  Job:  [id number]

## 2010-05-31 NOTE — H&P (Signed)
[name], Megan [name] NO.:  [id number]   MEDICAL RECORD NO.:  [id number]          PATIENT TYPE:  INP   LOCATION:                               FACILITY:  [facility]   PHYSICIAN:  [name], M.D.DATE OF BIRTH:  1965/03/10   DATE OF ADMISSION:  03/12/2004  DATE OF DISCHARGE:                                HISTORY & PHYSICAL   HISTORY OF PRESENT ILLNESS:  The patient is a 45 year old white female who I  performed an L4-5 decompression and fusion on at [facility] on  February 12, 2004.  The surgery went well.  Her postoperative course was  unremarkable.  The patient had called me yesterday and said that she was  having some drainage from her wound.  I worked her in today.  During today's  visit, she tells me she had drainage from her wound over the last couple of  weeks, but I thought this was normal.  She called me when it became more  purulent.  She has taken her temperature on several occasions, but has not  noticed any fevers.  She has the appropriate amount of back pain.   For further details of the patient's past medical history, please refer to  her prior office notes.   PHYSICAL EXAMINATION:  GENERAL:  This is a pleasant, obese, 45 year old  white female in no apparent distress.  LUNGS:  Clear to auscultation.  HEART:  Regular rate and rhythm.  ABDOMEN:  Soft and nontender.  BACK:  The patient's incision has a small area of dehiscence and some  surrounding erythema and some purulent discharge.  NEUROLOGY:  The patient is alert and oriented.  Moving all extremities well.   ASSESSMENT:  Wound infection.  I discussed this situation with the patient  and her husband.  I recommended that she be admitted and will plan to do an  incision and drainage of her wound to allow [place] to get some good cultures and  drain any purulent material.  We explained the procedure, its risks, and the  patient wants to proceed.   I explained that generally with wound  infections, we start with empiric IV  antibiotics, wait on the cultures, and thereafter fine-tube antibiotics and  arranged for her to get home IV antibiotics to be followed by p.o.  antibiotics.  I have answered her questions.      JDJ/MEDQ  D:  03/12/2004  T:  03/12/2004  Job:  [id number]

## 2010-06-13 ENCOUNTER — Encounter (HOSPITAL_BASED_OUTPATIENT_CLINIC_OR_DEPARTMENT_OTHER): Payer: PRIVATE HEALTH INSURANCE | Admitting: Hematology & Oncology

## 2010-06-13 DIAGNOSIS — D509 Iron deficiency anemia, unspecified: Secondary | ICD-10-CM

## 2010-07-11 ENCOUNTER — Encounter (HOSPITAL_BASED_OUTPATIENT_CLINIC_OR_DEPARTMENT_OTHER): Payer: Medicare Other | Admitting: Hematology & Oncology

## 2010-07-11 ENCOUNTER — Other Ambulatory Visit: Payer: Self-pay | Admitting: Hematology & Oncology

## 2010-07-11 DIAGNOSIS — D509 Iron deficiency anemia, unspecified: Secondary | ICD-10-CM

## 2010-07-11 LAB — CBC WITH DIFFERENTIAL (CANCER CENTER ONLY)
BASO#: 0 10*3/uL (ref 0.0–0.2)
BASO%: 0.4 % (ref 0.0–2.0)
EOS%: 4 % (ref 0.0–7.0)
Eosinophils Absolute: 0.2 10*3/uL (ref 0.0–0.5)
HCT: 37.2 % (ref 34.8–46.6)
HGB: 13.1 g/dL (ref 11.6–15.9)
LYMPH#: 1.6 10*3/uL (ref 0.9–3.3)
LYMPH%: 28.2 % (ref 14.0–48.0)
MCH: 30.9 pg (ref 26.0–34.0)
MCHC: 35.2 g/dL (ref 32.0–36.0)
MCV: 88 fL (ref 81–101)
MONO#: 0.4 10*3/uL (ref 0.1–0.9)
MONO%: 6.5 % (ref 0.0–13.0)
NEUT#: 3.5 10*3/uL (ref 1.5–6.5)
NEUT%: 60.9 % (ref 39.6–80.0)
Platelets: 157 10*3/uL (ref 145–400)
RBC: 4.24 10*6/uL (ref 3.70–5.32)
RDW: 13.3 % (ref 11.1–15.7)
WBC: 5.7 10*3/uL (ref 3.9–10.0)

## 2010-07-11 LAB — CHCC SATELLITE - SMEAR

## 2010-07-12 LAB — IRON AND TIBC
%SAT: 27 % (ref 20–55)
Iron: 66 ug/dL (ref 42–145)
TIBC: 244 ug/dL — ABNORMAL LOW (ref 250–470)
UIBC: 178 ug/dL

## 2010-07-12 LAB — RETICULOCYTES (CHCC)
ABS Retic: 52.4 10*3/uL (ref 19.0–186.0)
RBC.: 4.37 MIL/uL (ref 3.87–5.11)
Retic Ct Pct: 1.2 % (ref 0.4–2.3)

## 2010-07-12 LAB — FERRITIN: Ferritin: 271 ng/mL (ref 10–291)

## 2010-07-12 LAB — VITAMIN B12: Vitamin B-12: 217 pg/mL (ref 211–911)

## 2010-07-12 LAB — VITAMIN D 25 HYDROXY (VIT D DEFICIENCY, FRACTURES): Vit D, 25-Hydroxy: 33 ng/mL (ref 30–89)

## 2010-10-21 LAB — BASIC METABOLIC PANEL
BUN: 3 — ABNORMAL LOW
CO2: 30
Calcium: 8.2 — ABNORMAL LOW
Chloride: 97
Creatinine, Ser: 0.62
GFR calc Af Amer: >60
GFR calc non Af Amer: >60
Glucose, Bld: 86
Potassium: 3.6
Sodium: 133 — ABNORMAL LOW

## 2010-10-21 LAB — URINALYSIS, ROUTINE W REFLEX MICROSCOPIC
Bilirubin Urine: NEGATIVE
Glucose, UA: NEGATIVE
Hgb urine dipstick: NEGATIVE
Ketones, ur: NEGATIVE
Nitrite: NEGATIVE
Protein, ur: NEGATIVE
Specific Gravity, Urine: 1.015
Urobilinogen, UA: 0.2
pH: 8

## 2010-10-21 LAB — HEPATIC FUNCTION PANEL
ALT: 39 — ABNORMAL HIGH
AST: 39 — ABNORMAL HIGH
Albumin: 3.1 — ABNORMAL LOW
Alkaline Phosphatase: 104
Bilirubin, Direct: <0.1
Total Bilirubin: 0.6
Total Protein: 6.6

## 2010-10-21 LAB — CBC
HCT: 28.9 — ABNORMAL LOW
Hemoglobin: 9.5 — ABNORMAL LOW
MCHC: 32.8
MCV: 72.1 — ABNORMAL LOW
Platelets: 216
RBC: 4.02
RDW: 16.5 — ABNORMAL HIGH
WBC: 5.1

## 2010-10-21 LAB — CULTURE, BLOOD (ROUTINE X 2)
Culture: NO GROWTH
Culture: NO GROWTH

## 2010-10-21 LAB — DIFFERENTIAL
Basophils Absolute: 0
Basophils Relative: 1
Eosinophils Absolute: 0.1 — ABNORMAL LOW
Eosinophils Relative: 1
Lymphocytes Relative: 21
Lymphs Abs: 1.1
Monocytes Absolute: 0.5
Monocytes Relative: 10
Neutro Abs: 3.4
Neutrophils Relative %: 67

## 2010-10-21 LAB — INFLUENZA A+B VIRUS AG-DIRECT(RAPID)
Inflenza A Ag: NEGATIVE
Influenza B Ag: NEGATIVE

## 2010-10-21 LAB — PREGNANCY, URINE: Preg Test, Ur: NEGATIVE

## 2010-10-21 LAB — SEDIMENTATION RATE: Sed Rate: 33 — ABNORMAL HIGH

## 2010-10-21 LAB — CK: Total CK: 101

## 2010-10-29 LAB — CBC
HCT: 30.7 — ABNORMAL LOW
Hemoglobin: 10 — ABNORMAL LOW
MCHC: 32.5
MCV: 76.9 — ABNORMAL LOW
Platelets: 218
RBC: 3.99
RDW: 14.7 — ABNORMAL HIGH
WBC: 5.9

## 2010-10-29 LAB — DIFFERENTIAL
Basophils Absolute: 0.1
Basophils Relative: 1
Eosinophils Absolute: 0.2
Eosinophils Relative: 4
Lymphocytes Relative: 24
Lymphs Abs: 1.4
Monocytes Absolute: 0.5
Monocytes Relative: 9
Neutro Abs: 3.6
Neutrophils Relative %: 62

## 2010-10-29 LAB — APTT: aPTT: 31

## 2010-10-29 LAB — COMPREHENSIVE METABOLIC PANEL
ALT: 33
AST: 34
Albumin: 3.1 — ABNORMAL LOW
Alkaline Phosphatase: 75
BUN: 6
CO2: 28
Calcium: 8.5
Chloride: 106
Creatinine, Ser: 0.65
GFR calc Af Amer: >60
GFR calc non Af Amer: >60
Glucose, Bld: 102 — ABNORMAL HIGH
Potassium: 4
Sodium: 139
Total Bilirubin: 0.6
Total Protein: 6.1

## 2010-10-29 LAB — POCT CARDIAC MARKERS
CKMB, poc: 1.3
CKMB, poc: 1.6
Myoglobin, poc: 58.2
Myoglobin, poc: 68.3
Operator id: 1192
Operator id: 2725
Troponin i, poc: <0.05
Troponin i, poc: <0.05

## 2010-10-29 LAB — PROTIME-INR
INR: 1.1
Prothrombin Time: 14

## 2010-10-29 LAB — ACETAMINOPHEN LEVEL: Acetaminophen (Tylenol), Serum: <10 — ABNORMAL LOW

## 2010-12-10 ENCOUNTER — Telehealth: Payer: Self-pay | Admitting: *Deleted

## 2010-12-10 NOTE — Telephone Encounter (Signed)
Pt called said she was having surgery 01-27-11 made appointment for 12-26 said that was plenty of time before her surgery. I wanted to transfer to RN to triage situation but pt said that was ok

## 2010-12-30 ENCOUNTER — Other Ambulatory Visit: Payer: Self-pay | Admitting: Neurosurgery

## 2011-01-08 ENCOUNTER — Ambulatory Visit (HOSPITAL_BASED_OUTPATIENT_CLINIC_OR_DEPARTMENT_OTHER): Payer: PRIVATE HEALTH INSURANCE | Admitting: Hematology & Oncology

## 2011-01-08 ENCOUNTER — Other Ambulatory Visit: Payer: Medicare Other | Admitting: Lab

## 2011-01-08 ENCOUNTER — Encounter: Payer: Self-pay | Admitting: Hematology & Oncology

## 2011-01-08 ENCOUNTER — Other Ambulatory Visit: Payer: Self-pay | Admitting: Hematology & Oncology

## 2011-01-08 VITALS — BP 109/68 | HR 53 | Temp 97.8°F | Ht 66.0 in | Wt 261.0 lb

## 2011-01-08 DIAGNOSIS — D508 Other iron deficiency anemias: Secondary | ICD-10-CM | POA: Insufficient documentation

## 2011-01-08 DIAGNOSIS — D539 Nutritional anemia, unspecified: Secondary | ICD-10-CM

## 2011-01-08 DIAGNOSIS — M898X8 Other specified disorders of bone, other site: Secondary | ICD-10-CM

## 2011-01-08 DIAGNOSIS — D509 Iron deficiency anemia, unspecified: Secondary | ICD-10-CM

## 2011-01-08 HISTORY — DX: Nutritional anemia, unspecified: D53.9

## 2011-01-08 LAB — CBC WITH DIFFERENTIAL (CANCER CENTER ONLY)
BASO#: 0 10*3/uL (ref 0.0–0.2)
BASO%: 0.4 % (ref 0.0–2.0)
EOS%: 3.9 % (ref 0.0–7.0)
Eosinophils Absolute: 0.2 10*3/uL (ref 0.0–0.5)
HCT: 38.7 % (ref 34.8–46.6)
HGB: 13.2 g/dL (ref 11.6–15.9)
LYMPH#: 1.1 10*3/uL (ref 0.9–3.3)
LYMPH%: 23.5 % (ref 14.0–48.0)
MCH: 30.1 pg (ref 26.0–34.0)
MCHC: 34.1 g/dL (ref 32.0–36.0)
MCV: 88 fL (ref 81–101)
MONO#: 0.4 10*3/uL (ref 0.1–0.9)
MONO%: 8.9 % (ref 0.0–13.0)
NEUT#: 2.9 10*3/uL (ref 1.5–6.5)
NEUT%: 63.3 % (ref 39.6–80.0)
Platelets: 138 10*3/uL — ABNORMAL LOW (ref 145–400)
RBC: 4.39 10*6/uL (ref 3.70–5.32)
RDW: 13.4 % (ref 11.1–15.7)
WBC: 4.6 10*3/uL (ref 3.9–10.0)

## 2011-01-08 LAB — FERRITIN: Ferritin: 184 ng/mL (ref 10–291)

## 2011-01-08 LAB — CHCC SATELLITE - SMEAR

## 2011-01-08 NOTE — Progress Notes (Signed)
This office note has been dictated.

## 2011-01-08 NOTE — Progress Notes (Signed)
CC:   [name], [name] [name], [name] [name] Elnoria Howard, MD Kathrin Penner. Vear Clock, [name] [name], [name]  DIAGNOSIS:  Recurrent iron deficiency anemia.  CURRENT THERAPY:  IV iron as indicated.  INTERIM HISTORY:  Ms. Heldt comes in for followup.  She is going to be having back surgery by Dr. Lovell Sheehan next month.  She has lost quite a bit of weight.  The weight loss has been good for her.  She says she has lost about 130 pounds.  When we last saw her, her weight was 284.  Today, she is 261.  The patient still feels tired.  She says she has no appetite.  I am not sure what is going on with that.  She is also worried about a hard "knot" at the end of her sternum.  I tried to reassure her that this was likely the xiphoid process. However, she really wants to have a CT scan done.  She is very worried about having cancer.  As far as her iron studies go, we last gave her iron back in May.  She got 1020 mg of Feraheme.  She tolerated this very well.  Her last B12 was 217 back in June.  Her ferritin when we saw her back in June was 271.  I suppose that she may have some B12 deficiency.  This may be something that we need to look into.  She has had no tingling in her hands or feet.  She has had no change in bowel or bladder habits.  She does get occasional blood per rectum, which is a common for her.  PHYSICAL EXAM:  General:  This is an obese white female in no obvious distress.  Vital Signs:  Temperature of 97.8, pulse 53, respiratory rate 20, blood pressure 109/68.  Weight is 261.  Head/Neck:  Exam shows a normocephalic, atraumatic skull.  There are no ocular or oral lesions. There are no palpable cervical or supraclavicular lymph nodes.  Lungs: Clear bilaterally.  Cardiac:  Regular rate and rhythm with a normal S1, S2.  There are no murmurs, rubs or bruits.  Abdomen:  Soft with good bowel sounds.  There is no palpable abdominal mass.  There is no fluid wave.   There is no palpable hepatosplenomegaly.  Back:  No tenderness of the spine, ribs, or hips.  Extremities:  No clubbing, cyanosis or edema. Skin:  No rashes, ecchymosis or petechiae.  Neurologic:  No focal neurological deficits.  LABORATORY STUDIES:  White cell count 4.6, hemoglobin 13.2, hematocrit 38.7, platelet count 138.  MCV is 88.  IMPRESSION:  Ms. Nine is a 45 year old white female with history of iron deficiency anemia.  Again, when we last saw her in June, her B12 was a little on the lower side.  We will recheck this today.  I am just thankful that she has lost weight.  The back surgery hopefully will help her out.  We will get a CT scan set up of the chest.  Again, I believe this is just the xiphoid process, which she can now feel since she has lost weight.  We will have her come back to see [place] in another 4 months or so.    ______________________________ [name], [name] PRE/MEDQ  D:  01/08/2011  T:  01/08/2011  Job:  [id number]  ADDENDUM:  B12 is 188.  Ferritin is 274.  %Sat is 27%

## 2011-01-11 LAB — RETICULOCYTES (CHCC)
ABS Retic: 58.4 10*3/uL (ref 19.0–186.0)
RBC.: 4.49 MIL/uL (ref 3.87–5.11)
Retic Ct Pct: 1.3 % (ref 0.4–2.3)

## 2011-01-11 LAB — IRON AND TIBC
%SAT: 27 % (ref 20–55)
Iron: 69 ug/dL (ref 42–145)
TIBC: 257 ug/dL (ref 250–470)
UIBC: 188 ug/dL (ref 125–400)

## 2011-01-11 LAB — VITAMIN B12: Vitamin B-12: 188 pg/mL — ABNORMAL LOW (ref 211–911)

## 2011-01-11 LAB — TRANSFERRIN RECEPTOR, SOLUABLE: Transferrin Receptor, Soluble: 0.91 mg/L (ref 0.76–1.76)

## 2011-01-15 ENCOUNTER — Other Ambulatory Visit (HOSPITAL_BASED_OUTPATIENT_CLINIC_OR_DEPARTMENT_OTHER): Payer: Medicare Other

## 2011-01-15 ENCOUNTER — Encounter: Payer: Self-pay | Admitting: Hematology & Oncology

## 2011-01-15 NOTE — Progress Notes (Signed)
CT chest scheduled for 01-15-11 at [facility] has been cancelled as prior auth is still in clinical review with [facility] (decision made within 2 business days).  I left patient message of cancellation and that I will her back to reschedule as soon as I hear from [facility].

## 2011-01-16 ENCOUNTER — Encounter (HOSPITAL_COMMUNITY): Payer: Self-pay | Admitting: Pharmacy Technician

## 2011-01-16 ENCOUNTER — Other Ambulatory Visit (HOSPITAL_BASED_OUTPATIENT_CLINIC_OR_DEPARTMENT_OTHER): Payer: Medicare Other

## 2011-01-17 ENCOUNTER — Telehealth: Payer: Self-pay | Admitting: *Deleted

## 2011-01-17 ENCOUNTER — Other Ambulatory Visit: Payer: Self-pay | Admitting: Hematology & Oncology

## 2011-01-17 DIAGNOSIS — R0789 Other chest pain: Secondary | ICD-10-CM

## 2011-01-17 NOTE — Telephone Encounter (Signed)
Pt aware of 01-22-11 chest x ray here in radiology

## 2011-01-20 ENCOUNTER — Inpatient Hospital Stay (HOSPITAL_COMMUNITY): Admission: RE | Admit: 2011-01-20 | Payer: Medicare Other | Source: Ambulatory Visit

## 2011-01-22 ENCOUNTER — Other Ambulatory Visit (HOSPITAL_BASED_OUTPATIENT_CLINIC_OR_DEPARTMENT_OTHER): Payer: PRIVATE HEALTH INSURANCE

## 2011-01-27 ENCOUNTER — Other Ambulatory Visit (HOSPITAL_COMMUNITY): Payer: PRIVATE HEALTH INSURANCE

## 2011-01-28 ENCOUNTER — Ambulatory Visit (HOSPITAL_BASED_OUTPATIENT_CLINIC_OR_DEPARTMENT_OTHER)
Admission: RE | Admit: 2011-01-28 | Discharge: 2011-01-28 | Disposition: A | Discharge status: Home / Self Care | Payer: PRIVATE HEALTH INSURANCE | Source: Ambulatory Visit | Attending: Hematology & Oncology | Admitting: Hematology & Oncology

## 2011-01-28 DIAGNOSIS — R0789 Other chest pain: Secondary | ICD-10-CM

## 2011-01-28 DIAGNOSIS — R634 Abnormal weight loss: Secondary | ICD-10-CM | POA: Insufficient documentation

## 2011-01-28 DIAGNOSIS — Z0389 Encounter for observation for other suspected diseases and conditions ruled out: Secondary | ICD-10-CM

## 2011-01-28 DIAGNOSIS — R222 Localized swelling, mass and lump, trunk: Secondary | ICD-10-CM | POA: Insufficient documentation

## 2011-02-03 ENCOUNTER — Encounter: Payer: Self-pay | Admitting: Family

## 2011-02-03 ENCOUNTER — Ambulatory Visit (HOSPITAL_BASED_OUTPATIENT_CLINIC_OR_DEPARTMENT_OTHER)
Admission: RE | Admit: 2011-02-03 | Discharge: 2011-02-03 | Disposition: A | Discharge status: Home / Self Care | Payer: PRIVATE HEALTH INSURANCE | Source: Ambulatory Visit | Attending: Family | Admitting: Family

## 2011-02-03 ENCOUNTER — Ambulatory Visit (INDEPENDENT_AMBULATORY_CARE_PROVIDER_SITE_OTHER): Payer: PRIVATE HEALTH INSURANCE | Admitting: Family

## 2011-02-03 VITALS — BP 138/78 | HR 72 | Temp 98.2°F | Resp 16 | Wt 256.1 lb

## 2011-02-03 DIAGNOSIS — K754 Autoimmune hepatitis: Secondary | ICD-10-CM

## 2011-02-03 DIAGNOSIS — R509 Fever, unspecified: Secondary | ICD-10-CM

## 2011-02-03 DIAGNOSIS — E039 Hypothyroidism, unspecified: Secondary | ICD-10-CM

## 2011-02-03 DIAGNOSIS — E119 Type 2 diabetes mellitus without complications: Secondary | ICD-10-CM

## 2011-02-03 DIAGNOSIS — I10 Essential (primary) hypertension: Secondary | ICD-10-CM

## 2011-02-03 DIAGNOSIS — R059 Cough, unspecified: Secondary | ICD-10-CM | POA: Insufficient documentation

## 2011-02-03 DIAGNOSIS — R05 Cough: Secondary | ICD-10-CM

## 2011-02-03 DIAGNOSIS — R062 Wheezing: Secondary | ICD-10-CM

## 2011-02-03 DIAGNOSIS — J209 Acute bronchitis, unspecified: Secondary | ICD-10-CM | POA: Insufficient documentation

## 2011-02-03 MED ORDER — ALBUTEROL SULFATE HFA 108 (90 BASE) MCG/ACT IN AERS: 2.0000 | INHALATION_SPRAY | Freq: Four times a day (QID) | RESPIRATORY_TRACT | Status: DC | PRN

## 2011-02-03 MED ORDER — AZITHROMYCIN 250 MG PO TABS: ORAL_TABLET | ORAL | Status: AC

## 2011-02-03 MED ORDER — ALBUTEROL SULFATE (2.5 MG/3ML) 0.083% IN NEBU
2.5000 mg | INHALATION_SOLUTION | Freq: Once | RESPIRATORY_TRACT | Status: AC
  Administered 2011-02-03: 2.5 mg via RESPIRATORY_TRACT

## 2011-02-03 MED ORDER — FLUTICASONE-SALMETEROL 100-50 MCG/DOSE IN AEPB: 1.0000 | INHALATION_SPRAY | Freq: Two times a day (BID) | RESPIRATORY_TRACT | Status: DC

## 2011-02-03 NOTE — Assessment & Plan Note (Signed)
46 yr old female with bronchitis. + wheezing today which resolved with use of albuterol neb in the office. Will plan to treat with a short course of Advair, albuterol MDI q6 hours for the next few days and z-pak. She tells me that she is scheduled for a spinal surgery with Dr. Lovell Sheehan in the end of February and will return in 2 weeks for re-evaluation of her pulmonary status in our office.

## 2011-02-03 NOTE — Assessment & Plan Note (Signed)
She was following with endocrinology but tells me that she has not seen them in some time.  Obtain A1C.

## 2011-02-03 NOTE — Assessment & Plan Note (Signed)
Obtain TSH °

## 2011-02-03 NOTE — Patient Instructions (Signed)
Please complete your blood work prior to leaving.  Call if your symptoms worsen, if fever >101, or if you are not improving in 2-3 days.  Follow up in 2 months for a medicare wellness exam- come fasting to this appointment.

## 2011-02-03 NOTE — Progress Notes (Signed)
Subjective:    Patient ID: Megan [name], female    DOB: [birth year], 46 y.o.   MRN: [id number]  HPI  [name] is a 46 yr old female who presents today with several concerns.    1) Epigastric tenderness-  "feels like I took a good hit." + nausea no vomitting. She had phenergan at home and has been using on a daily basis.  + anorexia.  2) Fever- She reports that she had fever about 1 week ago up to 102 with associated flu like symptoms. Was in the bed for 2-3 days.    Granddaughter and mother had flu. She reports that her symptoms improved.  Saturday AM she developed recurrent subjective temp.    + cough, rattling and "wheezing."  She has been using a combivent inhaler which has been helping her symptoms.     Review of Systems See HPI  Past Medical History  Diagnosis Date  . Iron deficiency anemia due to dietary causes 01/08/2011  . Anemia, macrocytic 01/08/2011    History   Social History  . Marital Status: Divorced    Spouse Name: N/A    Number of Children: N/A  . Years of Education: N/A   Occupational History  . Not on file.   Social History Main Topics  . Smoking status: Current Everyday Smoker  . Smokeless tobacco: Never Used  . Alcohol Use: Not on file  . Drug Use: Not on file  . Sexually Active: Not on file   Other Topics Concern  . Not on file   Social History Narrative  . No narrative on file    No past surgical history on file.  No family history on file.  Allergies  Allergen Reactions  . Hydrocodone Anaphylaxis  . Morphine Other (See Comments)    REACTION: irregular heart beat  . Codeine Rash  . Penicillins Rash    Current Outpatient Prescriptions on File Prior to Visit  Medication Sig Dispense Refill  . aspirin 81 MG tablet Take 81 mg by mouth daily.       . B Complex Vitamins (B COMPLEX-B12 PO) Take 1 tablet by mouth daily.       . cholecalciferol (VITAMIN D) 1000 UNITS tablet Take 1,000 Units by mouth daily.       Marland Kitchen levothyroxine  (SYNTHROID, LEVOTHROID) 175 MCG tablet Take 175 mcg by mouth daily.       Marland Kitchen lisinopril (PRINIVIL,ZESTRIL) 10 MG tablet Take 10 mg by mouth daily.       . metaxalone (SKELAXIN) 800 MG tablet Take 800 mg by mouth 3 (three) times daily.       . metoprolol (TOPROL-XL) 50 MG 24 hr tablet Take 50 mg by mouth 2 (two) times daily.       Marland Kitchen oxycodone (ROXICODONE) 30 MG immediate release tablet Take 30 mg by mouth every 4 (four) hours as needed. For pain      . promethazine (PHENERGAN) 50 MG tablet Take 50 mg by mouth every 6 (six) hours as needed. For nausea        BP 138/78  Pulse 72  Temp(Src) 98.2 F (36.8 C) (Oral)  Resp 16  Wt 256 lb 1.9 oz (116.175 kg)       Objective:   Physical Exam  Constitutional: She appears well-developed and well-nourished.  HENT:  Head: Normocephalic and atraumatic.  Neck: Normal range of motion. Neck supple.  Cardiovascular: Normal rate and regular rhythm.   No murmur heard. Pulmonary/Chest: Effort normal.  Bilateral expiratory wheeze.  + cough.   Abdominal:       Abd is soft, non-tender, non-distended.  Pt's area of concern for "mass" is noted to be zyphoid process.   Musculoskeletal:       + tenderness to palpation overlying bilateral ribs beneath breasts.  Lymphadenopathy:    She has no cervical adenopathy.          Assessment & Plan:

## 2011-02-03 NOTE — Progress Notes (Signed)
Addended by: Mervin Kung A on: 02/03/2011 03:36 PM   Modules accepted: Orders

## 2011-02-04 ENCOUNTER — Encounter: Payer: Self-pay | Admitting: Family

## 2011-02-04 ENCOUNTER — Telehealth: Payer: Self-pay | Admitting: *Deleted

## 2011-02-04 LAB — HEPATIC FUNCTION PANEL
ALT: 20 U/L (ref 0–35)
AST: 29 U/L (ref 0–37)
Albumin: 3.8 g/dL (ref 3.5–5.2)
Alkaline Phosphatase: 81 U/L (ref 39–117)
Bilirubin, Direct: 0.1 mg/dL (ref 0.0–0.3)
Indirect Bilirubin: 0.4 mg/dL (ref 0.0–0.9)
Total Bilirubin: 0.5 mg/dL (ref 0.3–1.2)
Total Protein: 6.7 g/dL (ref 6.0–8.3)

## 2011-02-04 LAB — BASIC METABOLIC PANEL
BUN: 9 mg/dL (ref 6–23)
CO2: 29 mEq/L (ref 19–32)
Calcium: 9 mg/dL (ref 8.4–10.5)
Chloride: 102 mEq/L (ref 96–112)
Creat: 0.56 mg/dL (ref 0.50–1.10)
Glucose, Bld: 83 mg/dL (ref 70–99)
Potassium: 4.6 mEq/L (ref 3.5–5.3)
Sodium: 138 mEq/L (ref 135–145)

## 2011-02-04 LAB — HEMOGLOBIN A1C
Hgb A1c MFr Bld: 5.4 % (ref <5.7)
Mean Plasma Glucose: 108 mg/dL (ref <117)

## 2011-02-04 LAB — TSH: TSH: 1.889 u[IU]/mL (ref 0.350–4.500)

## 2011-02-04 NOTE — Telephone Encounter (Signed)
Received fax from [facility] for prior auth of Ventolin HFA. Left message with pt's mother to have pt return my call. Ins. Requires trial of Proair first. Need to know if pt has tried Proair in the past and if so was it effective, did she have side effects, why couldn't she take it?

## 2011-02-05 MED ORDER — ALBUTEROL SULFATE HFA 108 (90 BASE) MCG/ACT IN AERS: 2.0000 | INHALATION_SPRAY | Freq: Four times a day (QID) | RESPIRATORY_TRACT | Status: DC | PRN

## 2011-02-05 NOTE — Telephone Encounter (Signed)
Left message on machine to return my call with name of previous inhalers that she may have tried and/or failed.

## 2011-02-05 NOTE — Telephone Encounter (Signed)
OK to send pro-air in place of proventil please- instructions are the same.

## 2011-02-05 NOTE — Telephone Encounter (Signed)
Left message with pt's mother that ProAir has been sent to pharmacy.

## 2011-02-05 NOTE — Telephone Encounter (Signed)
Spoke with pt. She has never tried ProAir in the past. Is it ok to send Rx for Proair?  If so, what directions and how many refills?

## 2011-02-06 ENCOUNTER — Encounter: Payer: Self-pay | Admitting: Family

## 2011-02-14 HISTORY — PX: SPINE SURGERY: SHX786

## 2011-02-18 ENCOUNTER — Ambulatory Visit (INDEPENDENT_AMBULATORY_CARE_PROVIDER_SITE_OTHER): Payer: PRIVATE HEALTH INSURANCE | Admitting: Family

## 2011-02-18 ENCOUNTER — Encounter: Payer: Self-pay | Admitting: Family

## 2011-02-18 VITALS — BP 112/72 | HR 63 | Temp 97.8°F | Resp 16 | Wt 256.0 lb

## 2011-02-18 DIAGNOSIS — J209 Acute bronchitis, unspecified: Secondary | ICD-10-CM

## 2011-02-18 NOTE — Patient Instructions (Signed)
Please follow up in 3 months, sooner if problems or concerns. 
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
## 2011-02-18 NOTE — Assessment & Plan Note (Signed)
Resolved.  She can stop advair.  From a pulmonary standpoint she is stable to proceed with her upcoming spinal surgery.

## 2011-02-26 ENCOUNTER — Encounter (HOSPITAL_COMMUNITY): Payer: Self-pay

## 2011-02-26 ENCOUNTER — Ambulatory Visit (HOSPITAL_COMMUNITY): Admission: RE | Admit: 2011-02-26 | Payer: PRIVATE HEALTH INSURANCE | Source: Ambulatory Visit

## 2011-02-26 ENCOUNTER — Other Ambulatory Visit (HOSPITAL_COMMUNITY): Payer: PRIVATE HEALTH INSURANCE

## 2011-02-26 ENCOUNTER — Encounter (HOSPITAL_COMMUNITY)
Admission: RE | Admit: 2011-02-26 | Discharge: 2011-02-26 | Disposition: A | Discharge status: Home / Self Care | Payer: PRIVATE HEALTH INSURANCE | Source: Ambulatory Visit | Attending: Neurosurgery | Admitting: Neurosurgery

## 2011-02-26 HISTORY — DX: Unspecified retinal disorder: H35.9

## 2011-02-26 HISTORY — DX: Hypothyroidism, unspecified: E03.9

## 2011-02-26 HISTORY — DX: Fibromyalgia: M79.7

## 2011-02-26 HISTORY — DX: Pain in unspecified joint: M25.50

## 2011-02-26 HISTORY — DX: Dorsalgia, unspecified: M54.9

## 2011-02-26 HISTORY — DX: Other chronic pain: G89.29

## 2011-02-26 LAB — CBC
HCT: 39.9 % (ref 36.0–46.0)
Hemoglobin: 13.8 g/dL (ref 12.0–15.0)
MCH: 30.2 pg (ref 26.0–34.0)
MCHC: 34.6 g/dL (ref 30.0–36.0)
MCV: 87.3 fL (ref 78.0–100.0)
Platelets: 164 10*3/uL (ref 150–400)
RBC: 4.57 MIL/uL (ref 3.87–5.11)
RDW: 14.1 % (ref 11.5–15.5)
WBC: 5.8 10*3/uL (ref 4.0–10.5)

## 2011-02-26 LAB — HEPATIC FUNCTION PANEL
ALT: 18 U/L (ref 0–35)
AST: 26 U/L (ref 0–37)
Albumin: 3.1 g/dL — ABNORMAL LOW (ref 3.5–5.2)
Alkaline Phosphatase: 84 U/L (ref 39–117)
Bilirubin, Direct: <0.1 mg/dL (ref 0.0–0.3)
Total Bilirubin: 0.3 mg/dL (ref 0.3–1.2)
Total Protein: 7.1 g/dL (ref 6.0–8.3)

## 2011-02-26 LAB — BASIC METABOLIC PANEL
BUN: 6 mg/dL (ref 6–23)
CO2: 33 mEq/L — ABNORMAL HIGH (ref 19–32)
Calcium: 9 mg/dL (ref 8.4–10.5)
Chloride: 104 mEq/L (ref 96–112)
Creatinine, Ser: 0.52 mg/dL (ref 0.50–1.10)
GFR calc Af Amer: >90 mL/min (ref >90)
GFR calc non Af Amer: >90 mL/min (ref >90)
Glucose, Bld: 68 mg/dL — ABNORMAL LOW (ref 70–99)
Potassium: 4.3 mEq/L (ref 3.5–5.1)
Sodium: 140 mEq/L (ref 135–145)

## 2011-02-26 LAB — ABO/RH: ABO/RH(D): A POS

## 2011-02-26 LAB — SURGICAL PCR SCREEN
MRSA, PCR: NEGATIVE
Staphylococcus aureus: POSITIVE — AB

## 2011-02-26 LAB — TYPE AND SCREEN
ABO/RH(D): A POS
Antibody Screen: NEGATIVE

## 2011-02-26 NOTE — Pre-Procedure Instructions (Signed)
[address] [facility]  02/26/2011   Your procedure is scheduled on:  Wed, Feb 20 @ 0830  Report to [facility] at 0630 AM.  Call this number if you have problems the morning of surgery: [phone]    Remember:  Do not eat food:After midnight  May have clear liquids: up to 4 Hours before arrival.  Clear liquids include soda, tea, black coffee, apple or grape juice, broth.  Take these medicines the morning of surgery with A SIP OF WATER: Albuterol<Bring your inhaler with you>,Levothyroxine,Metoprolol,Pain Pill(if needed) and Phenergan(if needed)   Do not wear jewelry, make-up or nail polish.  Do not wear lotions, powders, or perfumes. You may wear deodorant.  Do not shave 48 hours prior to surgery.  Do not bring valuables to the hospital.  Contacts, dentures or bridgework may not be worn into surgery.  Leave suitcase in the car. After surgery it may be brought to your room.  For patients admitted to the hospital, checkout time is 11:00 AM the day of discharge.   Patients discharged the day of surgery will not be allowed to drive home.  Name and phone number of your driver:   Special Instructions: CHG Shower Use Special Wash: 1/2 bottle night before surgery and 1/2 bottle morning of surgery.   Please read over the following fact sheets that you were given: Pain Booklet, Coughing and Deep Breathing, Blood Transfusion Information, MRSA Information and Surgical Site Infection Prevention

## 2011-02-26 NOTE — Progress Notes (Signed)
Dr.weintraub is cardiologist and last visit 6months ago rquested from [place]  Stress test done about a yr ago requested from [place] [facility] done also requested from [facility]

## 2011-02-26 NOTE — Progress Notes (Signed)
Per dr. Alanda Amass no stress test

## 2011-02-26 NOTE — Progress Notes (Signed)
Treated for the flu about 2-3 wks ago

## 2011-02-27 NOTE — Progress Notes (Signed)
Spoke with medical records in [place] [facility] ... Regarding dote of ekg .... It was done on 07/04/2010.Marland KitchenMarland Kitchen

## 2011-03-04 MED ORDER — VANCOMYCIN HCL 1000 MG IV SOLR
1500.0000 mg | INTRAVENOUS | Status: DC
  Filled 2011-03-04 (×2): qty 1500

## 2011-03-05 ENCOUNTER — Inpatient Hospital Stay (HOSPITAL_COMMUNITY): Payer: PRIVATE HEALTH INSURANCE

## 2011-03-05 ENCOUNTER — Encounter (HOSPITAL_COMMUNITY)
Admission: RE | Disposition: A | Discharge status: Home Health | Payer: Self-pay | Source: Ambulatory Visit | Attending: Neurosurgery

## 2011-03-05 ENCOUNTER — Inpatient Hospital Stay (HOSPITAL_COMMUNITY)
Admission: RE | Admit: 2011-03-05 | Discharge: 2011-03-17 | DRG: 460 | Disposition: A | Discharge status: Home Health | Payer: PRIVATE HEALTH INSURANCE | Source: Ambulatory Visit | Attending: Neurosurgery | Admitting: Neurosurgery

## 2011-03-05 ENCOUNTER — Encounter (HOSPITAL_COMMUNITY): Payer: Self-pay | Admitting: Anesthesiology

## 2011-03-05 ENCOUNTER — Encounter (HOSPITAL_COMMUNITY): Payer: Self-pay | Admitting: *Deleted

## 2011-03-05 ENCOUNTER — Inpatient Hospital Stay (HOSPITAL_COMMUNITY): Payer: PRIVATE HEALTH INSURANCE | Admitting: Anesthesiology

## 2011-03-05 DIAGNOSIS — D509 Iron deficiency anemia, unspecified: Secondary | ICD-10-CM | POA: Diagnosis present

## 2011-03-05 DIAGNOSIS — IMO0002 Reserved for concepts with insufficient information to code with codable children: Secondary | ICD-10-CM | POA: Diagnosis not present

## 2011-03-05 DIAGNOSIS — Y831 Surgical operation with implant of artificial internal device as the cause of abnormal reaction of the patient, or of later complication, without mention of misadventure at the time of the procedure: Secondary | ICD-10-CM | POA: Diagnosis not present

## 2011-03-05 DIAGNOSIS — Z981 Arthrodesis status: Secondary | ICD-10-CM

## 2011-03-05 DIAGNOSIS — M51379 Other intervertebral disc degeneration, lumbosacral region without mention of lumbar back pain or lower extremity pain: Secondary | ICD-10-CM | POA: Diagnosis present

## 2011-03-05 DIAGNOSIS — Z7982 Long term (current) use of aspirin: Secondary | ICD-10-CM

## 2011-03-05 DIAGNOSIS — Z818 Family history of other mental and behavioral disorders: Secondary | ICD-10-CM

## 2011-03-05 DIAGNOSIS — Z880 Allergy status to penicillin: Secondary | ICD-10-CM

## 2011-03-05 DIAGNOSIS — E039 Hypothyroidism, unspecified: Secondary | ICD-10-CM | POA: Diagnosis present

## 2011-03-05 DIAGNOSIS — Z888 Allergy status to other drugs, medicaments and biological substances status: Secondary | ICD-10-CM

## 2011-03-05 DIAGNOSIS — Q762 Congenital spondylolisthesis: Principal | ICD-10-CM

## 2011-03-05 DIAGNOSIS — Z8249 Family history of ischemic heart disease and other diseases of the circulatory system: Secondary | ICD-10-CM

## 2011-03-05 DIAGNOSIS — I10 Essential (primary) hypertension: Secondary | ICD-10-CM | POA: Diagnosis present

## 2011-03-05 DIAGNOSIS — E119 Type 2 diabetes mellitus without complications: Secondary | ICD-10-CM | POA: Diagnosis present

## 2011-03-05 DIAGNOSIS — F17200 Nicotine dependence, unspecified, uncomplicated: Secondary | ICD-10-CM | POA: Diagnosis present

## 2011-03-05 DIAGNOSIS — D539 Nutritional anemia, unspecified: Secondary | ICD-10-CM

## 2011-03-05 DIAGNOSIS — I959 Hypotension, unspecified: Secondary | ICD-10-CM | POA: Diagnosis not present

## 2011-03-05 DIAGNOSIS — Z79899 Other long term (current) drug therapy: Secondary | ICD-10-CM

## 2011-03-05 DIAGNOSIS — K754 Autoimmune hepatitis: Secondary | ICD-10-CM | POA: Diagnosis present

## 2011-03-05 DIAGNOSIS — Z823 Family history of stroke: Secondary | ICD-10-CM

## 2011-03-05 DIAGNOSIS — D72819 Decreased white blood cell count, unspecified: Secondary | ICD-10-CM | POA: Diagnosis not present

## 2011-03-05 DIAGNOSIS — M79609 Pain in unspecified limb: Secondary | ICD-10-CM

## 2011-03-05 DIAGNOSIS — R509 Fever, unspecified: Secondary | ICD-10-CM | POA: Diagnosis not present

## 2011-03-05 DIAGNOSIS — Z6379 Other stressful life events affecting family and household: Secondary | ICD-10-CM

## 2011-03-05 DIAGNOSIS — M5137 Other intervertebral disc degeneration, lumbosacral region: Secondary | ICD-10-CM | POA: Diagnosis present

## 2011-03-05 DIAGNOSIS — Z8261 Family history of arthritis: Secondary | ICD-10-CM

## 2011-03-05 DIAGNOSIS — J45909 Unspecified asthma, uncomplicated: Secondary | ICD-10-CM | POA: Diagnosis present

## 2011-03-05 DIAGNOSIS — D508 Other iron deficiency anemias: Secondary | ICD-10-CM

## 2011-03-05 DIAGNOSIS — Y9210 Unspecified residential institution as the place of occurrence of the external cause: Secondary | ICD-10-CM | POA: Diagnosis not present

## 2011-03-05 DIAGNOSIS — G8929 Other chronic pain: Secondary | ICD-10-CM | POA: Diagnosis present

## 2011-03-05 DIAGNOSIS — M48062 Spinal stenosis, lumbar region with neurogenic claudication: Secondary | ICD-10-CM | POA: Diagnosis present

## 2011-03-05 DIAGNOSIS — Z833 Family history of diabetes mellitus: Secondary | ICD-10-CM

## 2011-03-05 DIAGNOSIS — E785 Hyperlipidemia, unspecified: Secondary | ICD-10-CM | POA: Diagnosis present

## 2011-03-05 LAB — GLUCOSE, CAPILLARY
Glucose-Capillary: 87 mg/dL (ref 70–99)
Glucose-Capillary: 108 mg/dL — ABNORMAL HIGH (ref 70–99)

## 2011-03-05 SURGERY — POSTERIOR LUMBAR FUSION 1 LEVEL
Anesthesia: General | Site: Back | Wound class: Clean

## 2011-03-05 MED ORDER — NALOXONE HCL 0.4 MG/ML IJ SOLN: 0.4000 mg | INTRAMUSCULAR | Status: DC | PRN

## 2011-03-05 MED ORDER — ACETAMINOPHEN 325 MG PO TABS
650.0000 mg | ORAL_TABLET | ORAL | Status: DC | PRN
  Administered 2011-03-06 – 2011-03-14 (×17): 650 mg via ORAL
  Filled 2011-03-05 (×18): qty 2

## 2011-03-05 MED ORDER — ALBUTEROL SULFATE HFA 108 (90 BASE) MCG/ACT IN AERS: 2.0000 | INHALATION_SPRAY | Freq: Four times a day (QID) | RESPIRATORY_TRACT | Status: DC | PRN

## 2011-03-05 MED ORDER — VANCOMYCIN HCL 500 MG IV SOLR
500.0000 mg | INTRAVENOUS | Status: DC
  Filled 2011-03-05: qty 500

## 2011-03-05 MED ORDER — MIDAZOLAM HCL 5 MG/5ML IJ SOLN
INTRAMUSCULAR | Status: DC | PRN
  Administered 2011-03-05: 2 mg via INTRAVENOUS

## 2011-03-05 MED ORDER — FLUTICASONE-SALMETEROL 100-50 MCG/DOSE IN AEPB
1.0000 | INHALATION_SPRAY | Freq: Two times a day (BID) | RESPIRATORY_TRACT | Status: DC
  Filled 2011-03-05: qty 14

## 2011-03-05 MED ORDER — BUPIVACAINE LIPOSOME 1.3 % IJ SUSP
20.0000 mL | INTRAMUSCULAR | Status: AC
  Administered 2011-03-05: 20 mL
  Filled 2011-03-05: qty 20

## 2011-03-05 MED ORDER — OXYCODONE HCL 5 MG PO TABS
30.0000 mg | ORAL_TABLET | ORAL | Status: DC | PRN
  Administered 2011-03-07 – 2011-03-08 (×4): 30 mg via ORAL
  Filled 2011-03-05 (×4): qty 6

## 2011-03-05 MED ORDER — LIDOCAINE HCL (CARDIAC) 20 MG/ML IV SOLN
INTRAVENOUS | Status: DC | PRN
  Administered 2011-03-05: 40 mg via INTRAVENOUS

## 2011-03-05 MED ORDER — LEVOTHYROXINE SODIUM 175 MCG PO TABS
175.0000 ug | ORAL_TABLET | Freq: Every day | ORAL | Status: DC
  Administered 2011-03-06 – 2011-03-17 (×12): 175 ug via ORAL
  Filled 2011-03-05 (×13): qty 1

## 2011-03-05 MED ORDER — SODIUM CHLORIDE 0.9 % IV SOLN
1750.0000 mg | Freq: Once | INTRAVENOUS | Status: AC
  Administered 2011-03-05: 1750 mg via INTRAVENOUS
  Filled 2011-03-05 (×2): qty 1750

## 2011-03-05 MED ORDER — METOPROLOL SUCCINATE ER 50 MG PO TB24
50.0000 mg | ORAL_TABLET | Freq: Every day | ORAL | Status: DC
  Administered 2011-03-06 – 2011-03-13 (×6): 50 mg via ORAL
  Filled 2011-03-05 (×10): qty 1

## 2011-03-05 MED ORDER — PROPOFOL 10 MG/ML IV EMUL
INTRAVENOUS | Status: DC | PRN
  Administered 2011-03-05: 160 mg via INTRAVENOUS

## 2011-03-05 MED ORDER — ONDANSETRON HCL 4 MG/2ML IJ SOLN
4.0000 mg | INTRAMUSCULAR | Status: DC | PRN
  Administered 2011-03-06 – 2011-03-11 (×2): 4 mg via INTRAVENOUS
  Filled 2011-03-05 (×2): qty 2

## 2011-03-05 MED ORDER — SODIUM CHLORIDE 0.9 % IV SOLN
0.4000 ug/kg/h | INTRAVENOUS | Status: DC
  Filled 2011-03-05: qty 2

## 2011-03-05 MED ORDER — BACITRACIN ZINC 500 UNIT/GM EX OINT
TOPICAL_OINTMENT | CUTANEOUS | Status: DC | PRN
  Administered 2011-03-05: 1 via TOPICAL

## 2011-03-05 MED ORDER — SODIUM CHLORIDE 0.9 % IV SOLN
10.0000 mg | INTRAVENOUS | Status: DC | PRN
  Administered 2011-03-05: 1 ug/min via INTRAVENOUS

## 2011-03-05 MED ORDER — HYDROMORPHONE HCL PF 1 MG/ML IJ SOLN
0.2500 mg | INTRAMUSCULAR | Status: DC | PRN
  Administered 2011-03-05 (×3): 0.5 mg via INTRAVENOUS

## 2011-03-05 MED ORDER — ROCURONIUM BROMIDE 100 MG/10ML IV SOLN
INTRAVENOUS | Status: DC | PRN
  Administered 2011-03-05 (×2): 50 mg via INTRAVENOUS

## 2011-03-05 MED ORDER — LISINOPRIL 10 MG PO TABS
10.0000 mg | ORAL_TABLET | Freq: Every day | ORAL | Status: DC
  Administered 2011-03-08 – 2011-03-13 (×4): 10 mg via ORAL
  Filled 2011-03-05 (×10): qty 1

## 2011-03-05 MED ORDER — DOCUSATE SODIUM 100 MG PO CAPS
100.0000 mg | ORAL_CAPSULE | Freq: Two times a day (BID) | ORAL | Status: DC
  Administered 2011-03-05 – 2011-03-17 (×24): 100 mg via ORAL
  Filled 2011-03-05 (×23): qty 1

## 2011-03-05 MED ORDER — PHENOL 1.4 % MT LIQD: 1.0000 | OROMUCOSAL | Status: DC | PRN

## 2011-03-05 MED ORDER — DIPHENHYDRAMINE HCL 12.5 MG/5ML PO ELIX: 12.5000 mg | ORAL_SOLUTION | Freq: Four times a day (QID) | ORAL | Status: DC | PRN

## 2011-03-05 MED ORDER — EPHEDRINE SULFATE 50 MG/ML IJ SOLN
INTRAMUSCULAR | Status: DC | PRN
  Administered 2011-03-05: 10 mg via INTRAVENOUS
  Administered 2011-03-05: 5 mg via INTRAVENOUS
  Administered 2011-03-05: 10 mg via INTRAVENOUS

## 2011-03-05 MED ORDER — FENTANYL CITRATE 0.05 MG/ML IJ SOLN
INTRAMUSCULAR | Status: DC | PRN
  Administered 2011-03-05: 50 ug via INTRAVENOUS
  Administered 2011-03-05: 200 ug via INTRAVENOUS

## 2011-03-05 MED ORDER — HYDROMORPHONE HCL PF 1 MG/ML IJ SOLN
INTRAMUSCULAR | Status: AC
  Filled 2011-03-05: qty 1

## 2011-03-05 MED ORDER — ONDANSETRON HCL 4 MG/2ML IJ SOLN: 4.0000 mg | Freq: Once | INTRAMUSCULAR | Status: DC | PRN

## 2011-03-05 MED ORDER — MENTHOL 3 MG MT LOZG: 1.0000 | LOZENGE | OROMUCOSAL | Status: DC | PRN

## 2011-03-05 MED ORDER — HYDROMORPHONE 0.3 MG/ML IV SOLN
INTRAVENOUS | Status: DC
  Administered 2011-03-05 – 2011-03-06 (×5): via INTRAVENOUS
  Administered 2011-03-07: 2.62 mg via INTRAVENOUS
  Administered 2011-03-07: 7.5 mg via INTRAVENOUS
  Administered 2011-03-07: 07:00:00 via INTRAVENOUS
  Administered 2011-03-07: 3.9 mg via INTRAVENOUS
  Administered 2011-03-07: 13:00:00 via INTRAVENOUS
  Administered 2011-03-08: 3.9 mg via INTRAVENOUS
  Administered 2011-03-08: 7.5 mg via INTRAVENOUS
  Administered 2011-03-08: 3.48 mg via INTRAVENOUS
  Administered 2011-03-08: 3.62 mg via INTRAVENOUS

## 2011-03-05 MED ORDER — HYDROMORPHONE 0.3 MG/ML IV SOLN
INTRAVENOUS | Status: AC
  Filled 2011-03-05: qty 25

## 2011-03-05 MED ORDER — HYDROMORPHONE HCL PF 1 MG/ML IJ SOLN
INTRAMUSCULAR | Status: AC
  Administered 2011-03-05: 0.5 mg via INTRAVENOUS
  Filled 2011-03-05: qty 1

## 2011-03-05 MED ORDER — PROMETHAZINE HCL 25 MG PO TABS
50.0000 mg | ORAL_TABLET | Freq: Four times a day (QID) | ORAL | Status: DC | PRN
  Administered 2011-03-05 – 2011-03-14 (×13): 50 mg via ORAL
  Filled 2011-03-05 (×14): qty 2

## 2011-03-05 MED ORDER — LACTATED RINGERS IV SOLN
INTRAVENOUS | Status: DC | PRN
  Administered 2011-03-05 (×2): via INTRAVENOUS

## 2011-03-05 MED ORDER — DIPHENHYDRAMINE HCL 50 MG/ML IJ SOLN: 12.5000 mg | Freq: Four times a day (QID) | INTRAMUSCULAR | Status: DC | PRN

## 2011-03-05 MED ORDER — ONDANSETRON HCL 4 MG/2ML IJ SOLN: 4.0000 mg | Freq: Four times a day (QID) | INTRAMUSCULAR | Status: DC | PRN

## 2011-03-05 MED ORDER — SODIUM CHLORIDE 0.9 % IJ SOLN: 9.0000 mL | INTRAMUSCULAR | Status: DC | PRN

## 2011-03-05 MED ORDER — DIAZEPAM 5 MG PO TABS: 5.0000 mg | ORAL_TABLET | Freq: Four times a day (QID) | ORAL | Status: DC | PRN

## 2011-03-05 MED ORDER — NEOSTIGMINE METHYLSULFATE 1 MG/ML IJ SOLN
INTRAMUSCULAR | Status: DC | PRN
  Administered 2011-03-05: 5 mg via INTRAVENOUS

## 2011-03-05 MED ORDER — ONDANSETRON HCL 4 MG/2ML IJ SOLN
INTRAMUSCULAR | Status: DC | PRN
  Administered 2011-03-05: 4 mg via INTRAVENOUS

## 2011-03-05 MED ORDER — VECURONIUM BROMIDE 10 MG IV SOLR
INTRAVENOUS | Status: DC | PRN
  Administered 2011-03-05: 1 mg via INTRAVENOUS
  Administered 2011-03-05: 2 mg via INTRAVENOUS
  Administered 2011-03-05: 3 mg via INTRAVENOUS
  Administered 2011-03-05: 1 mg via INTRAVENOUS

## 2011-03-05 MED ORDER — ZOLPIDEM TARTRATE 10 MG PO TABS: 10.0000 mg | ORAL_TABLET | Freq: Every evening | ORAL | Status: DC | PRN

## 2011-03-05 MED ORDER — THROMBIN 20000 UNITS EX KIT
PACK | CUTANEOUS | Status: DC | PRN
  Administered 2011-03-05: 10:00:00 via TOPICAL

## 2011-03-05 MED ORDER — IPRATROPIUM-ALBUTEROL 18-103 MCG/ACT IN AERO
2.0000 | INHALATION_SPRAY | Freq: Three times a day (TID) | RESPIRATORY_TRACT | Status: DC | PRN
  Filled 2011-03-05: qty 14.7

## 2011-03-05 MED ORDER — SODIUM CHLORIDE 0.9 % IV SOLN
INTRAVENOUS | Status: AC
  Filled 2011-03-05: qty 500

## 2011-03-05 MED ORDER — VANCOMYCIN HCL IN DEXTROSE 1-5 GM/200ML-% IV SOLN
INTRAVENOUS | Status: AC
  Administered 2011-03-05: 1000 mg via INTRAVENOUS
  Administered 2011-03-05: 500 mg via INTRAVENOUS
  Filled 2011-03-05: qty 200

## 2011-03-05 MED ORDER — BACITRACIN 50000 UNITS IM SOLR
INTRAMUSCULAR | Status: DC | PRN
  Administered 2011-03-05: 10:00:00

## 2011-03-05 MED ORDER — GLYCOPYRROLATE 0.2 MG/ML IJ SOLN
INTRAMUSCULAR | Status: DC | PRN
  Administered 2011-03-05: 1 mg via INTRAVENOUS

## 2011-03-05 MED ORDER — LACTATED RINGERS IV SOLN
INTRAVENOUS | Status: DC
  Administered 2011-03-05: 14:00:00 via INTRAVENOUS
  Administered 2011-03-06: 1000 mL via INTRAVENOUS
  Administered 2011-03-07 (×2): via INTRAVENOUS

## 2011-03-05 MED ORDER — ACETAMINOPHEN 650 MG RE SUPP: 650.0000 mg | RECTAL | Status: DC | PRN

## 2011-03-05 MED ORDER — BACITRACIN 50000 UNITS IM SOLR
INTRAMUSCULAR | Status: AC
  Filled 2011-03-05: qty 1

## 2011-03-05 MED ORDER — 0.9 % SODIUM CHLORIDE (POUR BTL) OPTIME
TOPICAL | Status: DC | PRN
  Administered 2011-03-05: 1000 mL

## 2011-03-05 MED ORDER — BUPIVACAINE-EPINEPHRINE PF 0.5-1:200000 % IJ SOLN
INTRAMUSCULAR | Status: DC | PRN
  Administered 2011-03-05: 10 mL

## 2011-03-05 MED ORDER — SODIUM CHLORIDE 0.9 % IV SOLN
200.0000 ug | INTRAVENOUS | Status: DC | PRN
  Administered 2011-03-05: 0.3 ug/kg/h via INTRAVENOUS

## 2011-03-05 SURGICAL SUPPLY — 75 items
APL SKNCLS STERI-STRIP NONHPOA (GAUZE/BANDAGES/DRESSINGS) ×1
BAG DECANTER FOR FLEXI CONT (MISCELLANEOUS) ×2 IMPLANT
BENZOIN TINCTURE PRP APPL 2/3 (GAUZE/BANDAGES/DRESSINGS) ×2 IMPLANT
BLADE SURG ROTATE 9660 (MISCELLANEOUS) IMPLANT
BRUSH SCRUB EZ PLAIN DRY (MISCELLANEOUS) ×2 IMPLANT
BUR ACORN 6.0 (BURR) ×2 IMPLANT
BUR MATCHSTICK NEURO 3.0 LAGG (BURR) ×2 IMPLANT
CANISTER SUCTION 2500CC (MISCELLANEOUS) ×2 IMPLANT
CAP REVERE LOCKING (Cap) ×2 IMPLANT
CLOTH BEACON ORANGE TIMEOUT ST (SAFETY) ×2 IMPLANT
CONT SPEC 4OZ CLIKSEAL STRL BL (MISCELLANEOUS) ×2 IMPLANT
COVER BACK TABLE 24X17X13 BIG (DRAPES) IMPLANT
COVER TABLE BACK 60X90 (DRAPES) ×1 IMPLANT
DRAPE C-ARM 42X72 X-RAY (DRAPES) ×4 IMPLANT
DRAPE LAPAROTOMY 100X72X124 (DRAPES) ×2 IMPLANT
DRAPE POUCH INSTRU U-SHP 10X18 (DRAPES) ×2 IMPLANT
DRAPE SURG 17X23 STRL (DRAPES) ×8 IMPLANT
ELECT BLADE 4.0 EZ CLEAN MEGAD (MISCELLANEOUS) ×2
ELECT REM PT RETURN 9FT ADLT (ELECTROSURGICAL) ×2
ELECTRODE BLDE 4.0 EZ CLN MEGD (MISCELLANEOUS) ×1 IMPLANT
ELECTRODE REM PT RTRN 9FT ADLT (ELECTROSURGICAL) ×1 IMPLANT
GAUZE SPONGE 4X4 16PLY XRAY LF (GAUZE/BANDAGES/DRESSINGS) ×2 IMPLANT
GLOVE BIO SURGEON STRL SZ8.5 (GLOVE) ×4 IMPLANT
GLOVE BIOGEL M 8.0 STRL (GLOVE) ×1 IMPLANT
GLOVE BIOGEL PI IND STRL 7.0 (GLOVE) IMPLANT
GLOVE BIOGEL PI IND STRL 7.5 (GLOVE) IMPLANT
GLOVE BIOGEL PI INDICATOR 7.0 (GLOVE) ×2
GLOVE BIOGEL PI INDICATOR 7.5 (GLOVE) ×1
GLOVE ECLIPSE 7.5 STRL STRAW (GLOVE) ×2 IMPLANT
GLOVE EXAM NITRILE LRG STRL (GLOVE) IMPLANT
GLOVE EXAM NITRILE MD LF STRL (GLOVE) ×2 IMPLANT
GLOVE EXAM NITRILE XL STR (GLOVE) IMPLANT
GLOVE EXAM NITRILE XS STR PU (GLOVE) IMPLANT
GLOVE SS BIOGEL STRL SZ 6.5 (GLOVE) IMPLANT
GLOVE SS BIOGEL STRL SZ 8 (GLOVE) ×2 IMPLANT
GLOVE SUPERSENSE BIOGEL SZ 6.5 (GLOVE) ×3
GLOVE SUPERSENSE BIOGEL SZ 8 (GLOVE) ×2
GLOVE SURG SS PI 6.5 STRL IVOR (GLOVE) ×1 IMPLANT
GOWN BRE IMP SLV AUR LG STRL (GOWN DISPOSABLE) ×2 IMPLANT
GOWN BRE IMP SLV AUR XL STRL (GOWN DISPOSABLE) ×5 IMPLANT
GOWN STRL REIN 2XL LVL4 (GOWN DISPOSABLE) IMPLANT
KIT BASIN OR (CUSTOM PROCEDURE TRAY) ×2 IMPLANT
KIT ROOM TURNOVER OR (KITS) ×2 IMPLANT
NDL HYPO 21X1.5 SAFETY (NEEDLE) IMPLANT
NEEDLE HYPO 21X1.5 SAFETY (NEEDLE) ×2 IMPLANT
NEEDLE HYPO 22GX1.5 SAFETY (NEEDLE) ×2 IMPLANT
NS IRRIG 1000ML POUR BTL (IV SOLUTION) ×2 IMPLANT
PACK FOAM VITOSS 10CC (Orthopedic Implant) ×1 IMPLANT
PACK LAMINECTOMY NEURO (CUSTOM PROCEDURE TRAY) ×2 IMPLANT
PAD ARMBOARD 7.5X6 YLW CONV (MISCELLANEOUS) ×6 IMPLANT
PATTIES SURGICAL .5 X.5 (GAUZE/BANDAGES/DRESSINGS) ×1 IMPLANT
PATTIES SURGICAL .5 X1 (DISPOSABLE) IMPLANT
PATTIES SURGICAL 1X1 (DISPOSABLE) ×1 IMPLANT
PUTTY 10ML ACTIFUSE ABX (Putty) ×1 IMPLANT
PUTTY 5ML ACTIFUSE ABX (Putty) ×1 IMPLANT
ROD REVERE CURVED 65MM (Rod) ×2 IMPLANT
SCREW REVERE 6.35 75X55MM (Screw) ×2 IMPLANT
SCREW SET BREAK OFF (Screw) ×4 IMPLANT
SLEEVE SURGEON STRL (DRAPES) ×1 IMPLANT
SPACER SUSTAIN O 10X26 15MM (Spacer) ×2 IMPLANT
SPONGE GAUZE 4X4 12PLY (GAUZE/BANDAGES/DRESSINGS) ×2 IMPLANT
SPONGE LAP 4X18 X RAY DECT (DISPOSABLE) IMPLANT
SPONGE NEURO XRAY DETECT 1X3 (DISPOSABLE) IMPLANT
SPONGE SURGIFOAM ABS GEL 100 (HEMOSTASIS) ×2 IMPLANT
STRIP CLOSURE SKIN 1/2X4 (GAUZE/BANDAGES/DRESSINGS) ×2 IMPLANT
SUT VIC AB 1 CT1 18XBRD ANBCTR (SUTURE) ×2 IMPLANT
SUT VIC AB 1 CT1 8-18 (SUTURE) ×6
SUT VIC AB 2-0 CP2 18 (SUTURE) ×4 IMPLANT
SYR 20CC LL (SYRINGE) ×1 IMPLANT
SYR 20ML ECCENTRIC (SYRINGE) ×2 IMPLANT
TAPE HYPAFIX 6X30 (GAUZE/BANDAGES/DRESSINGS) ×1 IMPLANT
TOWEL OR 17X24 6PK STRL BLUE (TOWEL DISPOSABLE) ×2 IMPLANT
TOWEL OR 17X26 10 PK STRL BLUE (TOWEL DISPOSABLE) ×2 IMPLANT
TRAY FOLEY CATH 14FRSI W/METER (CATHETERS) ×2 IMPLANT
WATER STERILE IRR 1000ML POUR (IV SOLUTION) ×2 IMPLANT

## 2011-03-05 NOTE — Op Note (Signed)
Brief history: The patient is a 46 year old white female who I performed an L4-5 decompression and fusion on several years ago. The patient has had chronic back pain but more recently has developed increasing back buttocks and leg pain consistent with neurogenic claudication. She has failed medical management and was worked up with a lumbar MRI which demonstrated a spinal stasis and spinal stenosis at L3-4. I discussed the various treatment options with the patient including surgery. She has weighed the risks, benefits, and alternative surgery the subcutaneous with a exploration of lumbar fusion with L3-4 decompression patient effusion.  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 Etc. requiring a wide decompression of the nerve roots.); L3-4 posterior lumbar interbody fusion with local morselized autograft bone and Actifusebone graft extender; insertion of interbody prosthesis at L3-4 (globus peek interbody prosthesis); posterior segmental instrumentation from L3-L5 with globus titanium pedicle screws and rods and Legacy screws; posterior lateral arthrodesis at L3-4 with local morselized autograft bone and Vitoss bone graft extender, exploration of lumbar fusion  Surgeon: Dr. Delma Officer  Asst.: Dr. Hilda Lias  Anesthesia: Gen. endotracheal  Estimated blood loss: 300 cc  Drains: None  Locations: None  Description of procedure: The patient was brought to the operating room by the anesthesia team. General endotracheal anesthesia was induced. The patient was turned to the prone position on the Wilson frame. The  patient's lumbosacral region was then prepared with Betadine scrub and Betadine solution. Sterile drapes were applied.  I then injected the area to be incised with Marcaine with epinephrine solution. I then used the scalpel to make a linear midline incision over the L3-4 interspace incising through the patient's previous surgical scar. I then used electrocautery to perform a bilateral subperiosteal dissection exposing the spinous process and lamina of L3 and L4 as well as exposing the old instrumentation at L4-5. We then inserted the Verstrac retractor to provide exposure.  I began the decompression by using the high speed drill to perform laminotomies at L3. We then used the Kerrison punches to widen the laminotomy and removed the ligamentum flavum at L3-4. We used the Kerrison punches to remove the medial facets at L3-4. We performed wide foraminotomies about the bilateral L3 and L4 nerve roots completing the decompression.  We now turned our attention to the posterior lumbar interbody fusion. I used a scalpel to incise the intervertebral disc at L3-4. I then performed a partial intervertebral discectomy at L3-4 using the pituitary forceps. We prepared the vertebral endplates at L3-4 for the fusion by removing the soft tissues with the curettes. We then used the trial spacers to pick the appropriate sized interbody prosthesis. We prefilled his prosthesis with a combination of local morselized autograft bone that we obtained during the decompression as well as Actifuse bone graft extender. We inserted the prefilled prosthesis into the interspace at L3-4. There was a good snug fit of the prosthesis in the interspace. We then filled and the remainder of the intervertebral disc space with local morselized autograft bone and Actifuse. This completed the posterior lumbar interbody arthrodesis.  We now turned attention to the instrumentation. I removed the FROM the old Legacy screws. And remove the old rod. I  attempted to  independently move the old pedicle screws to check on that goal fusion it appeared to be a solid fusion. Under fluoroscopic guidance we cannulated the bilateral L3 pedicles with the bone probe. We then removed the bone probe. He then tapped the pedicle with a 5.5 millimeter tap. We then removed the tap. We probed inside the tapped pedicle with a ball probe to rule out cortical breaches. We then inserted a 6.5 x 55 millimeter pedicle screw into theL3 pedicles bilaterally under fluoroscopic guidance. We then palpated along the medial aspect of the pedicles to rule out cortical breaches. There were none. The nerve roots were not injured. We then connected the unilateral pedicle screws from L3-L5 with a lordotic rod. We compressed the construct and secured the rod in place with the caps. We then tightened the caps appropriately. This completed the instrumentation from L3-L5.  We now turned our attention to the posterior lateral arthrodesis at L3-4. We used the high-speed drill to decorticate the remainder of the facets, pars, transverse process at L3-4. We then applied a combination of local morselized autograft bone and Vitoss bone graft extender over these decorticated posterior lateral structures. This completed the posterior lateral arthrodesis.  We then obtained hemostasis using bipolar electrocautery. We irrigated the wound out with bacitracin solution. We inspected the thecal sac and nerve roots and noted they were well decompressed. We then removed the retractor. We reapproximated patient's thoracolumbar fascia with interrupted #1 Vicryl suture. We reapproximated patient's subcutaneous tissue with interrupted 2-0 Vicryl suture. The reapproximated patient's skin with Steri-Strips and benzoin. The wound was then coated with bacitracin ointment. A sterile dressing was applied. The drapes were removed. The patient was subsequently returned to the supine position where they were extubated by the  anesthesia team. He was then transported to the post anesthesia care unit in stable condition. All sponge instrument and needle counts were correct at the end of this case.

## 2011-03-05 NOTE — Anesthesia Procedure Notes (Signed)
Procedure Name: Intubation Date/Time: 03/05/2011 8:42 AM Performed by: Caryn Bee Pre-anesthesia Checklist: Patient identified, Emergency Drugs available, Suction available, Patient being monitored and Timeout performed Patient Re-evaluated:Patient Re-evaluated prior to inductionOxygen Delivery Method: Circle System Utilized Preoxygenation: Pre-oxygenation with 100% oxygen Intubation Type: IV induction Ventilation: Mask ventilation without difficulty Laryngoscope Size: Mac and 4 Grade View: Grade I Tube size: 7.5 mm Number of attempts: 1 Airway Equipment and Method: stylet Placement Confirmation: ETT inserted through vocal cords under direct vision,  positive ETCO2 and breath sounds checked- equal and bilateral Secured at: 21 cm Tube secured with: Tape Dental Injury: Teeth and Oropharynx as per pre-operative assessment

## 2011-03-05 NOTE — Transfer of Care (Signed)
Immediate Anesthesia Transfer of Care Note  Patient: Megan [name]  Procedure(s) Performed: Procedure(s) (LRB): POSTERIOR LUMBAR FUSION 1 LEVEL (N/A)  Patient Location: PACU  Anesthesia Type: General  Level of Consciousness: awake, alert  and oriented  Airway & Oxygen Therapy: Patient Spontanous Breathing  Post-op Assessment: Report given to PACU RN and Post -op Vital signs reviewed and stable  Post vital signs: Reviewed and stable  Complications: No apparent anesthesia complications

## 2011-03-05 NOTE — Progress Notes (Signed)
Patient ID: Megan [name], female   DOB: 10/28/1965, 46 y.o.   MRN: [id number]  Subjective:  The patient is mildly somnolent but easily arousable. She complains of back pain.  Objective: Vital signs in last 24 hours: Temp:  [97.9 F (36.6 C)] 97.9 F (36.6 C) (02/20 1257) Pulse Rate:  [56-84] 56  (02/20 1315) Resp:  [20-27] 27  (02/20 1315) BP: (75-107)/(34-65) 101/50 mmHg (02/20 1315) SpO2:  [96 %-100 %] 99 % (02/20 1315) Weight:  [117 kg (257 lb 15 oz)] 117 kg (257 lb 15 oz) (02/20 0900)  Intake/Output from previous day:   Intake/Output this shift: Total I/O In: 1800 [I.V.:1800] Out: 1700 [Urine:1300; Blood:400]  Physical exam the patient is somnolent but arousable. Glasgow Coma Scale 13. She is moving all 4 extremities well.  Lab Results: No results found for this basename: WBC:2,HGB:2,HCT:2,PLT:2 in the last 72 hours BMET No results found for this basename: NA:2,K:2,CL:2,CO2:2,GLUCOSE:2,BUN:2,CREATININE:2,CALCIUM:2 in the last 72 hours  Studies/Results: No results found.  Assessment/Plan: Patient is doing well.  LOS: 0 days     [name] D 03/05/2011, 1:38 PM

## 2011-03-05 NOTE — Progress Notes (Signed)
Pt very lethargic upon arrival to the unit. Report from PACU nurse that she has been very sleepy since surgery. Pt's PCA was not started in PACU d/t her being very lethargic. Patient awoke around 5 and was in severe pain @ this time I ordered a PCA module and setup her PCA. Pt very upset that she didn't have her PCA started earlier, I explained why but the patient was still upset that it took a while to receive her pain medication. Pt's pain in now under control with reduced dose PCA. Rema Fendt, RN
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
## 2011-03-05 NOTE — Anesthesia Preprocedure Evaluation (Signed)
Anesthesia Evaluation  Patient identified by MRN, date of birth, ID band Patient awake    Reviewed: Allergy & Precautions, H&P , NPO status , Patient's Chart, lab work & pertinent test results  Airway Mallampati: II TM Distance: >3 FB     Dental  (+) Loose and Teeth Intact,    Pulmonary  clear to auscultation        Cardiovascular Regular Normal    Neuro/Psych    GI/Hepatic   Endo/Other    Renal/GU      Musculoskeletal   Abdominal (+) obese,  Abdomen: soft.    Peds  Hematology   Anesthesia Other Findings   Reproductive/Obstetrics                           Anesthesia Physical Anesthesia Plan  ASA: III  Anesthesia Plan: General   Post-op Pain Management:    Induction: Intravenous  Airway Management Planned: Oral ETT  Additional Equipment:   Intra-op Plan:   Post-operative Plan: Extubation in OR  Informed Consent: I have reviewed the patients History and Physical, chart, labs and discussed the procedure including the risks, benefits and alternatives for the proposed anesthesia with the patient or authorized representative who has indicated his/her understanding and acceptance.   Dental advisory given  Plan Discussed with:   Anesthesia Plan Comments:         Anesthesia Quick Evaluation

## 2011-03-05 NOTE — Preoperative (Signed)
Beta Blockers   Reason not to administer Beta Blockers:Not Applicable 

## 2011-03-05 NOTE — Anesthesia Postprocedure Evaluation (Signed)
  Anesthesia Post-op Note  Patient: Megan [name]  Procedure(s) Performed: Procedure(s) (LRB): POSTERIOR LUMBAR FUSION 1 LEVEL (N/A)  Patient Location: PACU  Anesthesia Type: General  Level of Consciousness: awake and alert   Airway and Oxygen Therapy: Patient Spontanous Breathing and Patient connected to nasal cannula oxygen  Post-op Pain: mild  Post-op Assessment: Post-op Vital signs reviewed and Patient's Cardiovascular Status Stable  Post-op Vital Signs: stable  Complications: No apparent anesthesia complications
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
## 2011-03-06 LAB — CBC
HCT: 34.9 % — ABNORMAL LOW (ref 36.0–46.0)
Hemoglobin: 11.8 g/dL — ABNORMAL LOW (ref 12.0–15.0)
MCH: 29.8 pg (ref 26.0–34.0)
MCHC: 33.8 g/dL (ref 30.0–36.0)
MCV: 88.1 fL (ref 78.0–100.0)
Platelets: 117 10*3/uL — ABNORMAL LOW (ref 150–400)
RBC: 3.96 MIL/uL (ref 3.87–5.11)
RDW: 14.1 % (ref 11.5–15.5)
WBC: 7.2 10*3/uL (ref 4.0–10.5)

## 2011-03-06 LAB — BASIC METABOLIC PANEL
BUN: 6 mg/dL (ref 6–23)
CO2: 28 mEq/L (ref 19–32)
Calcium: 8.5 mg/dL (ref 8.4–10.5)
Chloride: 101 mEq/L (ref 96–112)
Creatinine, Ser: 0.45 mg/dL — ABNORMAL LOW (ref 0.50–1.10)
GFR calc Af Amer: >90 mL/min (ref >90)
GFR calc non Af Amer: >90 mL/min (ref >90)
Glucose, Bld: 114 mg/dL — ABNORMAL HIGH (ref 70–99)
Potassium: 3.4 mEq/L — ABNORMAL LOW (ref 3.5–5.1)
Sodium: 134 mEq/L — ABNORMAL LOW (ref 135–145)

## 2011-03-06 MED ORDER — HYDROMORPHONE 0.3 MG/ML IV SOLN
INTRAVENOUS | Status: AC
  Filled 2011-03-06: qty 25
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
## 2011-03-06 NOTE — Plan of Care (Signed)
Problem: Phase I Progression Outcomes Goal: Pain controlled with appropriate interventions Outcome: Progressing Pain is fairly severe for patient. Working with positioning and non-pharmacological interventions along with PCA pump

## 2011-03-06 NOTE — Progress Notes (Signed)
OT Cancellation Note  Treatment cancelled today due to:  OT and PT attempting Evaluation this AM and pt refusing c/o pain. Pt with green PCA and encouraged to push if pain currently 10 out of 10. Pt c/o of HA since 03/05/11. OT and PT reattempt again.    Harrel Carina Crump   OTR/L Pager: [phone] Office: [phone] .

## 2011-03-06 NOTE — Progress Notes (Signed)
UR COMPLETED  

## 2011-03-06 NOTE — Progress Notes (Signed)
Patient ID: Megan [name], female   DOB: 25-Oct-1965, 46 y.o.   MRN: [id number] C/o incisional pain with no leg weakness, wound dry, spoke with her and daughter  About pain with second surgery. Pt/ot to see.i will be taken carwe of her for dr Lovell Sheehan.
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
## 2011-03-06 NOTE — Progress Notes (Signed)
Headache not completely relieved. States it is a frontal headache that increases when she sits up. She states she had a spinal headache after a myelogram before and this seems like that type of headache. Will allow to rest in right lateral lye. Encouraged to carry out IS hourly due to wheeze right base and temp 100F

## 2011-03-07 MED ORDER — HYDROMORPHONE 0.3 MG/ML IV SOLN
INTRAVENOUS | Status: AC
  Administered 2011-03-07: 3.6 mg via INTRAVENOUS
  Filled 2011-03-07: qty 25

## 2011-03-07 MED ORDER — HYDROMORPHONE 0.3 MG/ML IV SOLN
INTRAVENOUS | Status: AC
  Administered 2011-03-07: 7.5 mg via INTRAVENOUS
  Filled 2011-03-07: qty 25

## 2011-03-07 MED ORDER — HYDROMORPHONE 0.3 MG/ML IV SOLN
INTRAVENOUS | Status: AC
  Administered 2011-03-07: 7.2 mg via INTRAVENOUS
  Filled 2011-03-07: qty 25

## 2011-03-07 NOTE — Progress Notes (Signed)
Occupational Therapy Treatment Patient Details Name: Megan [name] MRN: [id number] DOB: 1965-10-23 Today's Date: 03/07/2011  OT Assessment/Plan OT Assessment/Plan Comments on Treatment Session: Pt progressing during this session wth max encouragement. Pt remains reluctant to engage in treatment reporting severe pain. Pain management is necessary for pt to progress with therapy at this time. Pt ambulated to restroom and sat up in chair at the end of session.  OT Plan: Discharge plan remains appropriate OT Frequency: Min 2X/week Follow Up Recommendations: Home health OT Equipment Recommended: Rolling walker with 5" wheels;Other (comment);Defer to next venue;3 in 1 bedside comode OT Goals Acute Rehab OT Goals OT Goal Formulation: With patient Time For Goal Achievement: 2 weeks ADL Goals Pt Will Perform Upper Body Dressing: with set-up;Sitting, chair;Sitting, bed;Unsupported ADL Goal: Upper Body Dressing - Progress: Progressing toward goals Pt Will Perform Lower Body Dressing: with set-up;Sitting, chair;Sitting, bed;Sit to stand from chair;Sit to stand from bed;Unsupported;with adaptive equipment ADL Goal: Lower Body Dressing - Progress: Progressing toward goals Pt Will Transfer to Toilet: with set-up;3-in-1 ADL Goal: Toilet Transfer - Progress: Progressing toward goals Pt Will Perform Toileting - Hygiene: with set-up;Sit to stand from 3-in-1/toilet ADL Goal: Toileting - Hygiene - Progress: Progressing toward goals Miscellaneous OT Goals Miscellaneous OT Goal #1: Pt will don / doff brace MOD I as precursor to ADLs and all mobility for basic transfers OT Goal: Miscellaneous Goal #1 - Progress: Progressing toward goals  OT Treatment Precautions/Restrictions  Precautions Precautions: Back Required Braces or Orthoses: Yes Spinal Brace: Lumbar corset;Applied in sitting position Restrictions Weight Bearing Restrictions: No   ADL ADL Eating/Feeding: Performed;Set up Where Assessed -  Eating/Feeding: Chair Upper Body Dressing: Performed;Moderate assistance Upper Body Dressing Details (indicate cue type and reason): don brace Where Assessed - Upper Body Dressing: Sitting, bed;Unsupported Toilet Transfer: Performed;Minimal assistance Toilet Transfer Method: Ambulating Toilet Transfer Equipment: Raised toilet seat with arms (or 3-in-1 over toilet) Toileting - Clothing Manipulation: Performed;Moderate assistance Where Assessed - Toileting Clothing Manipulation: Sit to stand from 3-in-1 or toilet Toileting - Hygiene: Not assessed Equipment Used: Rolling walker Ambulation Related to ADLs: Pt ambulated to restroom and back to chair beside the bed. pt reports extreme pain during session. Pt pushing PCA x3 during session. Pt ambulating Min (A) ADL Comments: Pt don back brace at EOB with (A) and pulling strings to tighten brace multiple times during session. Pt reluctant to transfer and strongly encouraged. Pt ambulating to restroom without void. pt reports feeling the need to void bladder just unable to do so during session. pt educated on back precautions this session with poor recall. pt educated to remain OOB for 30 minutes minimum and up to one hour. Tech and Rn notified that if pt stayed up in chair for lunch that would be best. Mobility  Bed Mobility Bed Mobility: Yes Right Sidelying to Sit: 5: Supervision;With rails;HOB flat Right Sidelying to Sit Details (indicate cue type and reason): Again needing max encouragement Sitting - Scoot to Edge of Bed: 5: Supervision Sitting - Scoot to Edge of Bed Details (indicate cue type and reason): cues not to arch back Transfers Sit to Stand: 4: Min assist;With upper extremity assist;From bed Sit to Stand Details (indicate cue type and reason): +2 present for safety and encouragement.  pt needs cueing for safe use of UEs, anterior wt shift, and max encouragement.   Stand to Sit: 4: Min assist;With upper extremity assist;With armrests;To  chair/3-in-1 Stand to Sit Details: cues to get closer to chair/3-in-1, use of armrests, and control  descent Exercises    End of Session OT - End of Session Equipment Utilized During Treatment: Gait belt;Back brace Activity Tolerance: Patient limited by pain Patient left: with call bell in reach;Other (comment);in chair (PCA in reach) General Behavior During Session: [facility] for tasks performed Cognition: [facility] for tasks performed  Lucile Shutters  03/07/2011, 2:01 PM Pager: [phone]

## 2011-03-07 NOTE — Progress Notes (Signed)
Patient ID: Megan [name], female   DOB: 08-Mar-1965, 47 y.o.   MRN: [id number] Temp 100.9. C/o incisional and fight leg pain. Mild ronchii bilaterally. Seen bi pt/ot. See orders.

## 2011-03-07 NOTE — Progress Notes (Signed)
Clinical Social Worker completed the psychosocial assessment which can be found in the shadow chart.  CSW met with the patient at the bedside to discuss plans and offer support.  Dicussed possible need for short term rehab at a skilled nursing facility.  Patient states that she and her mother discussed plans and both agree that patient should return home when medically ready for discharge.  CSW to notify CM of patient home health needs.    Clinical Social Worker will sign off for now as social work intervention is no longer needed. Please consult [place] again if new need arises.   Arnette Norris, MSW Intern  [place], [place] [phone]

## 2011-03-07 NOTE — Progress Notes (Signed)
Nursing- Pt was able to sit on the side of the bed, apply brace with assistance, stand with walker, and take a few steps. Pt tolerated fairly well, but with a lot of pain.

## 2011-03-07 NOTE — Progress Notes (Signed)
Physical Therapy Treatment Patient Details Name: Megan [name] MRN: [id number] DOB: 07-Jul-1965 Today's Date: 03/07/2011  PT Assessment/Plan  PT - Assessment/Plan Comments on Treatment Session: pt presents with PLIF.  pt very limited mobility secondary to  PT Plan: Discharge plan remains appropriate;Frequency remains appropriate PT Frequency: Min 5X/week Follow Up Recommendations: Home health PT;Supervision - Intermittent Equipment Recommended: Rolling walker with 5" wheels PT Goals  Acute Rehab PT Goals PT Goal: Rolling Supine to Right Side - Progress: Progressing toward goal PT Goal: Supine/Side to Sit - Progress: Progressing toward goal PT Goal: Sit to Stand - Progress: Progressing toward goal PT Goal: Ambulate - Progress: Progressing toward goal Additional Goals PT Goal: Additional Goal #1 - Progress: Progressing toward goal  PT Treatment Precautions/Restrictions  Precautions Precautions: Back Precaution Booklet Issued: No Required Braces or Orthoses: Yes Spinal Brace: Lumbar corset;Applied in sitting position Restrictions Weight Bearing Restrictions: No Mobility (including Balance) Bed Mobility Bed Mobility: Yes Right Sidelying to Sit: 5: Supervision;With rails;HOB flat Right Sidelying to Sit Details (indicate cue type and reason): Again needing max encouragement Sitting - Scoot to Edge of Bed: 5: Supervision Sitting - Scoot to Edge of Bed Details (indicate cue type and reason): cues not to arch back Transfers Transfers: Yes Sit to Stand: 4: Min assist;With upper extremity assist;From bed Sit to Stand Details (indicate cue type and reason): +2 present for safety and encouragement.  pt needs cueing for safe use of UEs, anterior wt shift, and max encouragement.   Stand to Sit: 4: Min assist;With upper extremity assist;With armrests;To chair/3-in-1 Stand to Sit Details: cues to get closer to chair/3-in-1, use of armrests, and control  descent Ambulation/Gait Ambulation/Gait: Yes Ambulation/Gait Assistance: 4: Min assist Ambulation/Gait Assistance Details (indicate cue type and reason): max cueing for encouragement as pt very resistant to mobility.  cues for use of RW. Ambulation Distance (Feet): 30 Feet Assistive device: Rolling walker Gait Pattern: Step-through pattern;Decreased stride length;Shuffle Stairs: No Wheelchair Mobility Wheelchair Mobility: No  Posture/Postural Control Posture/Postural Control: No significant limitations Balance Balance Assessed: No Exercise    End of Session PT - End of Session Equipment Utilized During Treatment: Back brace;Gait belt Activity Tolerance: Patient limited by pain Patient left: in chair;with call bell in reach Nurse Communication: Mobility status for transfers General Behavior During Session: Megan [name] for tasks performed Cognition: Megan [name] for tasks performed  Megan [name], [place] [phone] 03/07/2011, 1:27 PM

## 2011-03-08 MED ORDER — METAXALONE 800 MG PO TABS
800.0000 mg | ORAL_TABLET | Freq: Three times a day (TID) | ORAL | Status: DC
  Administered 2011-03-08 – 2011-03-17 (×28): 800 mg via ORAL
  Filled 2011-03-08 (×32): qty 1

## 2011-03-08 MED ORDER — OXYCODONE HCL 5 MG PO TABS
30.0000 mg | ORAL_TABLET | ORAL | Status: DC | PRN
  Administered 2011-03-08 – 2011-03-17 (×49): 30 mg via ORAL
  Filled 2011-03-08 (×49): qty 6

## 2011-03-08 MED ORDER — HYDROMORPHONE 0.3 MG/ML IV SOLN
INTRAVENOUS | Status: AC
  Administered 2011-03-08: 7.5 mg via INTRAVENOUS
  Filled 2011-03-08: qty 25

## 2011-03-08 NOTE — Progress Notes (Signed)
Patient ID: Megan [name], female   DOB: Mar 01, 1965, 46 y.o.   MRN: [id number] Subjective: Patient reports Lots of pain  Objective: Vital signs in last 24 hours: Temp:  [97.8 F (36.6 C)-100.9 F (38.3 C)] 97.8 F (36.6 C) (02/23 0600) Pulse Rate:  [72-83] 83  (02/23 0600) Resp:  [16-19] 19  (02/23 0600) BP: (108-143)/(65-75) 143/75 mmHg (02/23 0600) SpO2:  [94 %-97 %] 97 % (02/23 0600)  Intake/Output from previous day: 02/22 0701 - 02/23 0700 In: 1380 [P.O.:480; I.V.:900] Out: 500 [Urine:500] Intake/Output this shift:    No changes  Lab Results:  [facility] 03/06/11 0606  WBC 7.2  HGB 11.8*  HCT 34.9*  PLT 117*   BMET  Basename 03/06/11 0606  NA 134*  K 3.4*  CL 101  CO2 28  GLUCOSE 114*  BUN 6  CREATININE 0.45*  CALCIUM 8.5    Studies/Results: No results found.  Assessment/Plan: Will change her pain meds and stop PCA  LOS: 3 days  As above   Reinaldo Meeker, MD 03/08/2011, 9:51 AM

## 2011-03-08 NOTE — Progress Notes (Signed)
Physical Therapy Treatment Patient Details Name: Megan [name] MRN: [id number] DOB: [birth year] Today's Date: 03/08/2011  PT Assessment/Plan  PT - Assessment/Plan Comments on Treatment Session: Pt able to progress with ambulation today but continues to require strong encouragement throughout session.  Tends to be mildly nonrecptive to cues for safety & technique.  Pt states anticipated d/c day is Monday- pt will need to perform stair training before d/cing PT Plan: Discharge plan remains appropriate Follow Up Recommendations: Home health PT;Supervision - Intermittent Equipment Recommended: Rolling walker with 5" wheels PT Goals  Acute Rehab PT Goals PT Goal: Rolling Supine to Right Side - Progress: Not met PT Goal: Supine/Side to Sit - Progress: Not met PT Goal: Sit to Supine/Side - Progress: Not met PT Goal: Sit to Stand - Progress: Progressing toward goal PT Goal: Ambulate - Progress: Progressing toward goal Additional Goals PT Goal: Additional Goal #1 - Progress: Not met  PT Treatment Precautions/Restrictions  Precautions Precautions: Back Precaution Booklet Issued: No Precaution Comments: Pt able to independently verbalize 3/3 back precautions but requires cueing to reinforce precautions with functional mobility.   Required Braces or Orthoses: Yes Spinal Brace: Lumbar corset;Applied in sitting position Restrictions Weight Bearing Restrictions: No Mobility (including Balance) Bed Mobility Right Sidelying to Sit: 5: Supervision;HOB flat Right Sidelying to Sit Details (indicate cue type and reason): Cues to reinforce precautions & technique.   Sitting - Scoot to Edge of Bed: 5: Supervision Sitting - Scoot to [place] of Bed Details (indicate cue type and reason): Cues to reinforce precautions & technique.  Cues to position UE's on bed rather than use RW but pt somewhat not receptive to cues.   Sit to Sidelying Right: HOB flat;Other (comment) (Min Guard (A)) Sit to Sidelying Right  Details (indicate cue type and reason): Verbal & tactile cues to reinforce precautions & technique.   Transfers Sit to Stand: 5: Supervision;From bed Sit to Stand Details (indicate cue type and reason): Cues for hand placement & technique but pt not receptive for hand placement & using RW to pull up to standing despite cues.   Stand to Sit: 5: Supervision;To bed;With armrests Stand to Sit Details: Cues for hand placement & technique.  Ambulation/Gait Ambulation/Gait Assistance: Other (comment) (Min Guard (A)) Ambulation/Gait Assistance Details (indicate cue type and reason): Close guarding for safety.  Pt with shuffle-like steps & tends to drag Rt foot.  Max encouragement to increase distance.   Ambulation Distance (Feet): 50 Feet Assistive device: Rolling walker Gait Pattern: Decreased step length - right;Decreased step length - left;Decreased hip/knee flexion - left;Decreased hip/knee flexion - right Stairs: No Wheelchair Mobility Wheelchair Mobility: No  Posture/Postural Control Posture/Postural Control: No significant limitations Exercise    End of Session PT - End of Session Equipment Utilized During Treatment: Back brace;Gait belt Activity Tolerance: Patient limited by pain Patient left: in bed;with call bell in reach Nurse Communication: Mobility status for transfers;Mobility status for ambulation General Behavior During Session: [place] for tasks performed Cognition: [facility] for tasks performed  Lara Mulch 03/08/2011, 11:59 AM [phone]

## 2011-03-09 ENCOUNTER — Inpatient Hospital Stay (HOSPITAL_COMMUNITY): Payer: PRIVATE HEALTH INSURANCE

## 2011-03-09 LAB — URINALYSIS, ROUTINE W REFLEX MICROSCOPIC
Glucose, UA: NEGATIVE mg/dL
Hgb urine dipstick: NEGATIVE
Ketones, ur: 15 mg/dL — AB
Nitrite: NEGATIVE
Protein, ur: NEGATIVE mg/dL
Specific Gravity, Urine: 1.029 (ref 1.005–1.030)
Urobilinogen, UA: 1 mg/dL (ref 0.0–1.0)
pH: 6 (ref 5.0–8.0)

## 2011-03-09 LAB — URINE MICROSCOPIC-ADD ON

## 2011-03-09 MED ORDER — FLUCONAZOLE 150 MG PO TABS
150.0000 mg | ORAL_TABLET | Freq: Once | ORAL | Status: AC
  Administered 2011-03-09: 150 mg via ORAL
  Filled 2011-03-09: qty 1

## 2011-03-09 NOTE — Progress Notes (Signed)
Patient ID: Megan [name], female   DOB: February 09, 1965, 46 y.o.   MRN: [id number] NEUROSURGERY PROGRESS NOTE  Doing better. Complains of appropriate back soreness. Still has some left anterior thigh pain that is significant No numbness, tingling or weakness Ambulating and voiding better Good strength and sensation to in bed exam Incision CDI  Temp:  [98 F (36.7 C)-100.3 F (37.9 C)] 99.1 F (37.3 C) (02/24 0600) Pulse Rate:  [67-84] 67  (02/24 0600) Resp:  [18-20] 20  (02/24 0600) BP: (101-119)/(52-73) 109/69 mmHg (02/24 0600) SpO2:  [95 %-99 %] 96 % (02/24 0600)  Plan: Continue pain control. Continued to mobilize. Home soon.  Tia Alert, MD 03/09/2011 7:28 AM

## 2011-03-09 NOTE — Progress Notes (Signed)
PT Cancellation Note  Pt agreeable to PT today as she desires to get home. Upon sitting pt noted to be shaking and had cyanotic and blotchy UEs. RN called to room, BP and HR WNL SpO2 with difficulty reading (between 85-100%). RN found pt to be with high fever. PT held today.  Ketan Renz ([name]) Carleene Mains PT, DPT Acute Rehabilitation [phone]

## 2011-03-10 ENCOUNTER — Inpatient Hospital Stay (HOSPITAL_COMMUNITY): Payer: PRIVATE HEALTH INSURANCE

## 2011-03-10 DIAGNOSIS — R5082 Postprocedural fever: Secondary | ICD-10-CM

## 2011-03-10 DIAGNOSIS — Y849 Medical procedure, unspecified as the cause of abnormal reaction of the patient, or of later complication, without mention of misadventure at the time of the procedure: Secondary | ICD-10-CM

## 2011-03-10 LAB — CBC
HCT: 31.8 % — ABNORMAL LOW (ref 36.0–46.0)
Hemoglobin: 10.8 g/dL — ABNORMAL LOW (ref 12.0–15.0)
MCH: 29.9 pg (ref 26.0–34.0)
MCHC: 34 g/dL (ref 30.0–36.0)
MCV: 88.1 fL (ref 78.0–100.0)
Platelets: 130 10*3/uL — ABNORMAL LOW (ref 150–400)
RBC: 3.61 MIL/uL — ABNORMAL LOW (ref 3.87–5.11)
RDW: 13.7 % (ref 11.5–15.5)
WBC: 3.6 10*3/uL — ABNORMAL LOW (ref 4.0–10.5)

## 2011-03-10 MED ORDER — MOXIFLOXACIN HCL IN NACL 400 MG/250ML IV SOLN
400.0000 mg | INTRAVENOUS | Status: DC
  Administered 2011-03-10: 400 mg via INTRAVENOUS
  Filled 2011-03-10: qty 250

## 2011-03-10 MED ORDER — CIPROFLOXACIN IN D5W 400 MG/200ML IV SOLN
400.0000 mg | Freq: Two times a day (BID) | INTRAVENOUS | Status: DC
  Administered 2011-03-11: 400 mg via INTRAVENOUS
  Filled 2011-03-10 (×4): qty 200

## 2011-03-10 MED ORDER — BISACODYL 10 MG RE SUPP
10.0000 mg | Freq: Every day | RECTAL | Status: DC | PRN
  Administered 2011-03-10: 10 mg via RECTAL
  Filled 2011-03-10: qty 1

## 2011-03-10 MED ORDER — VANCOMYCIN HCL 1000 MG IV SOLR
2500.0000 mg | Freq: Once | INTRAVENOUS | Status: AC
  Administered 2011-03-10: 2500 mg via INTRAVENOUS
  Filled 2011-03-10: qty 2500

## 2011-03-10 MED ORDER — VANCOMYCIN HCL IN DEXTROSE 1-5 GM/200ML-% IV SOLN
1000.0000 mg | Freq: Two times a day (BID) | INTRAVENOUS | Status: DC
  Administered 2011-03-11 – 2011-03-12 (×4): 1000 mg via INTRAVENOUS
  Filled 2011-03-10 (×5): qty 200

## 2011-03-10 MED FILL — Sodium Chloride Irrigation Soln 0.9%: Qty: 3000 | Status: AC

## 2011-03-10 MED FILL — Sodium Chloride IV Soln 0.9%: INTRAVENOUS | Qty: 1000 | Status: AC

## 2011-03-10 MED FILL — Heparin Sodium (Porcine) Inj 1000 Unit/ML: INTRAMUSCULAR | Qty: 30 | Status: AC

## 2011-03-10 NOTE — Progress Notes (Signed)
   CARE MANAGEMENT NOTE 03/10/2011  Patient:  Megan [name], Megan [name]   Account Number:  [id number]  Date Initiated:  03/10/2011  Documentation initiated by:  [facility]  Subjective/Objective Assessment:   L4-5 decompression instrumentation and fusion, back pain     Action/Plan:   Anticipated DC Date:  03/12/2011   Anticipated DC Plan:  HOME W HOME HEALTH SERVICES      DC Planning Services  CM consult      [facility] Choice  HOME HEALTH   Choice offered to / List presented to:  C-1 Patient           HH agency  [facility]   Status of service:  In process, will continue to follow Medicare Important Message given?   (If response is "NO", the following Medicare IM given date fields will be blank) Date Medicare IM given:   Date Additional Medicare IM given:    Discharge Disposition:    Per UR Regulation:    Comments:  03/10/2011 1530 Spoke to pt and states she prefers [facility]. Message left for MD for HHPT orders and DME. PT recommended HH PT at time of d/c. Contacted [facility] for pending referral. Pt requesting 3n1, RW and tub bench. Requesting reacher and explained item is not a covered DME with insurance. Pt will to pay out of pocket. Will have [facility] give pt price for item. Isidoro Donning RN CCM Case Mgmt phone [phone]

## 2011-03-10 NOTE — Progress Notes (Signed)
Pt has been having fever  with T- max of 103.1 degrees F  rectally .Cxray with UA  Done. UA  Results  with few leucocytes , many squamous epithelia  Cells with few  Bacterial  UA.  Tylelnol with cold packs  Offered periodically ,  Pt ambulated and used  Incentive  Spirometer as prescribed but  fever persist.  Temp at 0600 this am is 99.9  Degree F.  Any further  orders  . ?. Thank You . Laroy Apple RN
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
## 2011-03-10 NOTE — Progress Notes (Signed)
Pt. Continues to have rectal temperatures ranging from 101.4  - 102  through the night. Tylenol lowers fever for a short duration but then spikes back up. History of staph infection with prior sx.  U/A showed many squamous epithelial, few leukocytes and few bacteria. Cxray done 2/24. Pt is complaining of sharp shooting pain in L hip/leg that she states was not there prior to surgery. Pt. Is concerned about this and would like to discuss with MD. Pt. Wants rolling walker and ( large ) commode chair and shower seat for home at d/c. LBM 2/19 ! Need laxative order.

## 2011-03-10 NOTE — Progress Notes (Signed)
Physical Therapy Treatment Patient Details Name: Megan [name] MRN: [id number] DOB: 1965-04-03 Today's Date: 03/10/2011  PT Assessment/Plan  PT - Assessment/Plan Comments on Treatment Session: pt presents s/p PLIF. pt with much better participation today.  pt continues to not be receptive to back precautions and safety cues despite explainations about importance of back precautions.   PT Plan: Discharge plan remains appropriate;Frequency remains appropriate PT Frequency: Min 5X/week Follow Up Recommendations: Home health PT;Supervision - Intermittent Equipment Recommended: Rolling walker with 5" wheels PT Goals  Acute Rehab PT Goals PT Goal: Sit to Stand - Progress: Progressing toward goal PT Goal: Ambulate - Progress: Progressing toward goal PT Goal: Up/Down Stairs - Progress: Progressing toward goal Additional Goals PT Goal: Additional Goal #1 - Progress: Progressing toward goal  PT Treatment Precautions/Restrictions  Precautions Precautions: Back Precaution Booklet Issued: No Precaution Comments: Pt able to independently verbalize 3/3 back precautions but requires cueing to reinforce precautions with functional mobility.   Required Braces or Orthoses: Yes Spinal Brace: Lumbar corset;Applied in sitting position Restrictions Weight Bearing Restrictions: No Mobility (including Balance) Bed Mobility Bed Mobility: No Transfers Transfers: Yes Sit to Stand: 5: Supervision;With upper extremity assist;From chair/3-in-1 Sit to Stand Details (indicate cue type and reason): cues for use of UEs Stand to Sit: 5: Supervision;With upper extremity assist;To chair/3-in-1;To bed Stand to Sit Details: cues for use of UEs Ambulation/Gait Ambulation/Gait: Yes Ambulation/Gait Assistance: 5: Supervision Ambulation/Gait Assistance Details (indicate cue type and reason): cues for upright posture, not leaning on RW, back precautions Ambulation Distance (Feet): 150 Feet Assistive device: Rolling  walker Gait Pattern: Step-through pattern;Decreased stride length;Trunk flexed Stairs: Yes Stairs Assistance: 4: Min assist Stairs Assistance Details (indicate cue type and reason): cues for safe technique, back precautions, use of L rail Stair Management Technique: One rail Left;Sideways Number of Stairs: 2  Wheelchair Mobility Wheelchair Mobility: No  Posture/Postural Control Posture/Postural Control: No significant limitations Balance Balance Assessed: No Exercise    End of Session PT - End of Session Equipment Utilized During Treatment: Back brace;Gait belt Activity Tolerance: Patient tolerated treatment well Patient left: in bed;with call bell in reach (sitting at EOB) Nurse Communication: Mobility status for transfers;Mobility status for ambulation General Behavior During Session: [facility] for tasks performed Cognition: [facility] for tasks performed  [name], [place] [phone] 03/10/2011, 1:11 PM
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
## 2011-03-10 NOTE — Progress Notes (Signed)
Occupational Therapy Treatment Patient Details Name: Megan [name] MRN: [id number] DOB: May 25, 1965 Today's Date: 03/10/2011  OT Assessment/Plan OT Assessment/Plan Comments on Treatment Session: Pt. is progressing with ADLs and functional mobility.  Pt. up and ambulating in room without asistance and reports she showered by herself.  Question pt's compliance with back precautions, although she has been fully instructed and her questions re: implications were explained in detail OT Plan: Discharge plan remains appropriate OT Frequency: Min 2X/week Follow Up Recommendations: Home health OT Equipment Recommended: 3 in 1 bedside comode OT Goals ADL Goals ADL Goal: Lower Body Dressing - Progress: Progressing toward goals ADL Goal: Toilet Transfer - Progress: Progressing toward goals ADL Goal: Toileting - Hygiene - Progress: Met Miscellaneous OT Goals OT Goal: Miscellaneous Goal #1 - Progress: Progressing toward goals  OT Treatment Precautions/Restrictions  Precautions Precautions: Back Required Braces or Orthoses: Yes Spinal Brace: Lumbar corset;Applied in sitting position Restrictions Weight Bearing Restrictions: No   ADL ADL Grooming: Performed;Wash/dry hands;Brushing hair;Supervision/safety Where Assessed - Grooming: Standing at sink Lower Body Dressing: Simulated;Supervision/safety (minimal cues for correct technique) Lower Body Dressing Details (indicate cue type and reason): Pt. reports she dressed self this a.m.  Pt. reports tossing pants toward feet, and working feet into pants.  Questioned pt. on bending while performing this way.  Initially she states she did not; however, upon further questioning, pt. acknowledges she "might have" bent forward some.  Pt. is able to cross ankle over knees, and instructed pt. that this is safe technique for LB ADLs.  Also discussed use of reacher.   Where Assessed - Lower Body Dressing: Sit to stand from chair Toilet Transfer:  Performed;Supervision/safety Toilet Transfer Method: Proofreader: Raised toilet seat with arms (or 3-in-1 over toilet) Toileting - Clothing Manipulation: Performed;Supervision/safety Where Assessed - Toileting Clothing Manipulation: Standing Toileting - Hygiene: Performed;Modified independent Where Assessed - Toileting Hygiene: Sit on 3-in-1 or toilet Equipment Used: Rolling walker Ambulation Related to ADLs: Pt. ambulated in room and to 300 gym with supervision ADL Comments: Pt. instructed on back precautions.  pt. requires min cues to adhere to cues.  At end of session, pt. attempted to lean over to pull up bed rail.  Therapist offered to do this for her, and pt. stated "I will wait until you leave if I am going to do anything I am not supposed to do".  Pt. donned brace independently Mobility  Bed Mobility Bed Mobility: No Transfers Transfers: Yes Sit to Stand: 5: Supervision;With upper extremity assist;From chair/3-in-1 Stand to Sit: 5: Supervision;With upper extremity assist;To chair/3-in-1;To bed Exercises    End of Session OT - End of Session Equipment Utilized During Treatment: Back brace Activity Tolerance: Patient limited by pain Patient left: with call bell in reach;Other (comment);in bed (EOB) Nurse Communication: Mobility status for transfers;Mobility status for ambulation General Behavior During Session: [facility] for tasks performed Cognition: [facility] for tasks performed  Evynn Boutelle M  03/10/2011, 9:37 PM

## 2011-03-10 NOTE — Progress Notes (Signed)
Patient ID: Megan [name], female   DOB: Feb 10, 1965, 46 y.o.   MRN: [id number] Subjective:  The patient is alert and pleasant. She looks well.  Objective: Vital signs in last 24 hours: Temp:  [99 F (37.2 C)-103.1 F (39.5 C)] 100.8 F (38.2 C) (02/25 1020) Pulse Rate:  [69-82] 71  (02/25 1020) Resp:  [20] 20  (02/25 1020) BP: (98-127)/(49-76) 98/62 mmHg (02/25 1020) SpO2:  [85 %-100 %] 96 % (02/25 1020)  Intake/Output from previous day: 02/24 0701 - 02/25 0700 In: 730 [P.O.:730] Out: 570 [Urine:570] Intake/Output this shift: Total I/O In: 240 [P.O.:240] Out: -   Physical exam the patient is alert and oriented. She is moving all 4 extremity is well. Her wound is healing well without  signs of infection.  Lab Results:  Basename 03/10/11 0923  WBC 3.6*  HGB 10.8*  HCT 31.8*  PLT 130*   BMET No results found for this basename: NA:2,K:2,CL:2,CO2:2,GLUCOSE:2,BUN:2,CREATININE:2,CALCIUM:2 in the last 72 hours  Studies/Results: Dg Chest 2 View  03/10/2011  *RADIOLOGY REPORT*  Clinical Data: Shortness of breath and fever.  CHEST - 2 VIEW  Comparison: Chest radiograph performed 02/03/2011  Findings: The lungs are well-aerated.  There is suggestion of mild lower lobe opacity on the lateral view.  There is no evidence of pleural effusion or pneumothorax.  The heart is normal in size; the mediastinal contour is within normal limits.  No acute osseous abnormalities are seen.  IMPRESSION: Suggestion of mild lower lobe opacity on the lateral view, which could reflect mild pneumonia given clinical concern.  Original Report Authenticated By: Tonia Ghent, [name]    Assessment/Plan: Postop day 5: The patient is doing well except below.  Fevers: I'll as ID to see the patient. She may have pneumonia as per the x-ray. But given her history of autoimmune hepatitis I'll ask for IDs input. In the meantime I'll start Avelox empirically.  LOS: 5 days     Orazio Weller D 03/10/2011, 11:13  AM

## 2011-03-11 ENCOUNTER — Inpatient Hospital Stay (HOSPITAL_COMMUNITY): Payer: PRIVATE HEALTH INSURANCE

## 2011-03-11 ENCOUNTER — Other Ambulatory Visit: Payer: Self-pay

## 2011-03-11 DIAGNOSIS — R509 Fever, unspecified: Secondary | ICD-10-CM

## 2011-03-11 DIAGNOSIS — D539 Nutritional anemia, unspecified: Secondary | ICD-10-CM

## 2011-03-11 DIAGNOSIS — M4800 Spinal stenosis, site unspecified: Secondary | ICD-10-CM

## 2011-03-11 DIAGNOSIS — J13 Pneumonia due to Streptococcus pneumoniae: Secondary | ICD-10-CM

## 2011-03-11 DIAGNOSIS — M79609 Pain in unspecified limb: Secondary | ICD-10-CM

## 2011-03-11 LAB — BLOOD GAS, ARTERIAL
Acid-Base Excess: 6.7 mmol/L — ABNORMAL HIGH (ref 0.0–2.0)
Bicarbonate: 30.5 mEq/L — ABNORMAL HIGH (ref 20.0–24.0)
Drawn by: 330991
O2 Content: 2 L/min
O2 Saturation: 98.2 %
Patient temperature: 101.3
TCO2: 31.8 mmol/L (ref 0–100)
pCO2 arterial: 45.5 mmHg — ABNORMAL HIGH (ref 35.0–45.0)
pH, Arterial: 7.449 — ABNORMAL HIGH (ref 7.350–7.400)
pO2, Arterial: 101 mmHg — ABNORMAL HIGH (ref 80.0–100.0)

## 2011-03-11 LAB — CARDIAC PANEL(CRET KIN+CKTOT+MB+TROPI)
CK, MB: 1.2 ng/mL (ref 0.3–4.0)
Relative Index: INVALID (ref 0.0–2.5)
Total CK: 20 U/L (ref 7–177)
Troponin I: <0.3 ng/mL (ref <0.30)

## 2011-03-11 LAB — DIFFERENTIAL
Basophils Absolute: 0 10*3/uL (ref 0.0–0.1)
Basophils Relative: 0 % (ref 0–1)
Eosinophils Absolute: 0.1 10*3/uL (ref 0.0–0.7)
Eosinophils Relative: 4 % (ref 0–5)
Lymphocytes Relative: 17 % (ref 12–46)
Lymphs Abs: 0.5 10*3/uL — ABNORMAL LOW (ref 0.7–4.0)
Monocytes Absolute: 0.4 10*3/uL (ref 0.1–1.0)
Monocytes Relative: 15 % — ABNORMAL HIGH (ref 3–12)
Neutro Abs: 1.8 10*3/uL (ref 1.7–7.7)
Neutrophils Relative %: 64 % (ref 43–77)

## 2011-03-11 LAB — CBC
HCT: 32.1 % — ABNORMAL LOW (ref 36.0–46.0)
Hemoglobin: 11 g/dL — ABNORMAL LOW (ref 12.0–15.0)
MCH: 29.9 pg (ref 26.0–34.0)
MCHC: 34.3 g/dL (ref 30.0–36.0)
MCV: 87.2 fL (ref 78.0–100.0)
Platelets: 114 10*3/uL — ABNORMAL LOW (ref 150–400)
RBC: 3.68 MIL/uL — ABNORMAL LOW (ref 3.87–5.11)
RDW: 13.6 % (ref 11.5–15.5)
WBC: 2.8 10*3/uL — ABNORMAL LOW (ref 4.0–10.5)

## 2011-03-11 LAB — COMPREHENSIVE METABOLIC PANEL
ALT: 9 U/L (ref 0–35)
AST: 15 U/L (ref 0–37)
Albumin: 2.5 g/dL — ABNORMAL LOW (ref 3.5–5.2)
Alkaline Phosphatase: 70 U/L (ref 39–117)
BUN: 7 mg/dL (ref 6–23)
CO2: 30 mEq/L (ref 19–32)
Calcium: 8 mg/dL — ABNORMAL LOW (ref 8.4–10.5)
Chloride: 99 mEq/L (ref 96–112)
Creatinine, Ser: 0.48 mg/dL — ABNORMAL LOW (ref 0.50–1.10)
GFR calc Af Amer: >90 mL/min (ref >90)
GFR calc non Af Amer: >90 mL/min (ref >90)
Glucose, Bld: 106 mg/dL — ABNORMAL HIGH (ref 70–99)
Potassium: 3 mEq/L — ABNORMAL LOW (ref 3.5–5.1)
Sodium: 136 mEq/L (ref 135–145)
Total Bilirubin: 0.5 mg/dL (ref 0.3–1.2)
Total Protein: 5.9 g/dL — ABNORMAL LOW (ref 6.0–8.3)

## 2011-03-11 MED ORDER — AZTREONAM 2 G IJ SOLR
2.0000 g | Freq: Three times a day (TID) | INTRAMUSCULAR | Status: DC
  Filled 2011-03-11 (×2): qty 2

## 2011-03-11 MED ORDER — DEXTROSE 5 % IV SOLN
2.0000 g | Freq: Three times a day (TID) | INTRAVENOUS | Status: DC
  Administered 2011-03-11 – 2011-03-17 (×17): 2 g via INTRAVENOUS
  Filled 2011-03-11 (×19): qty 2

## 2011-03-11 MED ORDER — POTASSIUM CHLORIDE CRYS ER 20 MEQ PO TBCR
30.0000 meq | EXTENDED_RELEASE_TABLET | Freq: Two times a day (BID) | ORAL | Status: DC
  Administered 2011-03-11: 30 meq via ORAL
  Filled 2011-03-11: qty 1

## 2011-03-11 MED ORDER — POTASSIUM CHLORIDE 10 MEQ/100ML IV SOLN
10.0000 meq | INTRAVENOUS | Status: AC
  Administered 2011-03-11 – 2011-03-12 (×6): 10 meq via INTRAVENOUS
  Filled 2011-03-11 (×6): qty 100

## 2011-03-11 MED ORDER — CLINDAMYCIN PHOSPHATE 300 MG/50ML IV SOLN
300.0000 mg | Freq: Three times a day (TID) | INTRAVENOUS | Status: DC
  Administered 2011-03-11 – 2011-03-16 (×14): 300 mg via INTRAVENOUS
  Filled 2011-03-11 (×16): qty 50

## 2011-03-11 MED ORDER — CIPROFLOXACIN IN D5W 400 MG/200ML IV SOLN
400.0000 mg | Freq: Two times a day (BID) | INTRAVENOUS | Status: DC
  Filled 2011-03-11 (×2): qty 200

## 2011-03-11 MED ORDER — SODIUM CHLORIDE 0.9 % IV SOLN: 500.0000 mg | Freq: Three times a day (TID) | INTRAVENOUS | Status: DC

## 2011-03-11 MED ORDER — IOHEXOL 300 MG/ML  SOLN: 100.0000 mL | Freq: Once | INTRAMUSCULAR | Status: AC | PRN

## 2011-03-11 MED ORDER — GABAPENTIN 400 MG PO CAPS
400.0000 mg | ORAL_CAPSULE | Freq: Three times a day (TID) | ORAL | Status: DC
  Administered 2011-03-11 – 2011-03-17 (×6): 400 mg via ORAL
  Filled 2011-03-11 (×21): qty 1

## 2011-03-11 MED ORDER — CIPROFLOXACIN IN D5W 400 MG/200ML IV SOLN: 400.0000 mg | Freq: Two times a day (BID) | INTRAVENOUS | Status: DC

## 2011-03-11 NOTE — Progress Notes (Signed)
PT Cancellation: Pt declining PT this morning as she is feeling terrible with fever of 103. PT will attempt later today.  Megan [name] ([name]) Carleene Mains PT, DPT Acute Rehabilitation [phone]

## 2011-03-11 NOTE — Progress Notes (Signed)
INFECTIOUS DISEASE PROGRESS NOTE  ID: Megan [name] is a 46 y.o. female with  Active Problems:  * No active hospital problems. *   Subjective: C/o fever. No BM. No cough, no SOB.   Abtx:  Anti-infectives     Start     Dose/Rate Route Frequency Ordered Stop   03/11/11 0500   vancomycin (VANCOCIN) IVPB 1000 mg/200 mL premix        1,000 mg 200 mL/hr over 60 Minutes Intravenous Every 12 hours 03/10/11 1621     03/10/11 1700   ciprofloxacin (CIPRO) IVPB 400 mg        400 mg 200 mL/hr over 60 Minutes Intravenous Every 12 hours 03/10/11 1605 03/18/11 1659   03/10/11 1630   vancomycin (VANCOCIN) 2,500 mg in sodium chloride 0.9 % 500 mL IVPB        2,500 mg 250 mL/hr over 120 Minutes Intravenous  Once 03/10/11 1621 03/10/11 2114   03/10/11 1200   moxifloxacin (AVELOX) IVPB 400 mg  Status:  Discontinued        400 mg 250 mL/hr over 60 Minutes Intravenous Every 24 hours 03/10/11 1117 03/10/11 1605   03/09/11 1200   fluconazole (DIFLUCAN) tablet 150 mg        150 mg Oral  Once 03/09/11 0918 03/09/11 1129   03/05/11 2200   vancomycin (VANCOCIN) 1,750 mg in sodium chloride 0.9 % 500 mL IVPB        1,750 mg 250 mL/hr over 120 Minutes Intravenous  Once 03/05/11 1653 03/06/11 0032   03/05/11 0945   vancomycin (VANCOCIN) 500 mg in sodium chloride 0.9 % 100 mL IVPB  Status:  Discontinued        500 mg 100 mL/hr over 60 Minutes Intravenous To Surgery 03/05/11 0939 03/05/11 1524   03/05/11 0935   bacitracin 50,000 Units in sodium chloride irrigation 0.9 % 500 mL irrigation  Status:  Discontinued          As needed 03/05/11 0935 03/05/11 1255   03/05/11 0834   vancomycin (VANCOCIN) 1 GM/200ML IVPB     Comments: Sharlene Dory (ELENA): cabinet override         03/05/11 0834 03/05/11 0950   03/05/11 0828   bacitracin 21308 UNITS injection     Comments: Worthy Flank: cabinet override         03/05/11 0828 03/05/11 2029   03/04/11 1547   vancomycin (VANCOCIN) 1,500 mg in sodium chloride 0.9  % 500 mL IVPB  Status:  Discontinued        1,500 mg 250 mL/hr over 120 Minutes Intravenous 120 min pre-op 03/04/11 1547 03/05/11 0938          Medications:  Scheduled:   . ciprofloxacin  400 mg Intravenous Q12H  . docusate sodium  100 mg Oral BID  . gabapentin  400 mg Oral TID  . levothyroxine  175 mcg Oral Q0600  . lisinopril  10 mg Oral Daily  . metaxalone  800 mg Oral TID  . metoprolol succinate  50 mg Oral Daily  . vancomycin  2,500 mg Intravenous Once  . vancomycin  1,000 mg Intravenous Q12H  . DISCONTD: moxifloxacin  400 mg Intravenous Q24H    Objective: Vital signs in last 24 hours: Temp:  [99.3 F (37.4 C)-103.6 F (39.8 C)] 103.6 F (39.8 C) (02/26 0916) Pulse Rate:  [70-82] 82  (02/26 0916) Resp:  [18-21] 18  (02/26 0916) BP: (98-126)/(68-80) 126/80 mmHg (02/26 1029) SpO2:  [91 %-96 %]  91 % (02/26 0916)   General appearance: alert, cooperative, fatigued and no distress Back: mild-mod tenderness in central portion of wound. no fluctuamce Resp: clear to auscultation bilaterally Cardio: regular rate and rhythm GI: normal findings: bowel sounds normal and soft, non-tender  Lab Results  Basename 03/10/11 0923  WBC 3.6*  HGB 10.8*  HCT 31.8*  NA --  K --  CL --  CO2 --  BUN --  CREATININE --  GLU --   Liver Panel No results found for this basename: PROT:2,ALBUMIN:2,AST:2,ALT:2,ALKPHOS:2,BILITOT:2,BILIDIR:2,IBILI:2 in the last 72 hours Sedimentation Rate No results found for this basename: ESRSEDRATE in the last 72 hours C-Reactive Protein No results found for this basename: CRP:2 in the last 72 hours  Microbiology: Recent Results (from the past 240 hour(s))  CULTURE, BLOOD (ROUTINE X 2)     Status: Normal (Preliminary result)   Collection Time   03/10/11  9:22 AM      Component Value Range Status Comment   Specimen Description BLOOD LEFT ARM   Final    Special Requests BOTTLES DRAWN AEROBIC AND ANAEROBIC 10CC   Final    Culture  Setup Time  [id number]   Final    Culture     Final    Value:        BLOOD CULTURE RECEIVED NO GROWTH TO DATE CULTURE WILL BE HELD FOR 5 DAYS BEFORE ISSUING A FINAL NEGATIVE REPORT   Report Status PENDING   Incomplete   CULTURE, BLOOD (ROUTINE X 2)     Status: Normal (Preliminary result)   Collection Time   03/10/11  9:28 AM      Component Value Range Status Comment   Specimen Description BLOOD RIGHT ARM   Final    Special Requests BOTTLES DRAWN AEROBIC AND ANAEROBIC 10CC   Final    Culture  Setup Time [id number]   Final    Culture     Final    Value:        BLOOD CULTURE RECEIVED NO GROWTH TO DATE CULTURE WILL BE HELD FOR 5 DAYS BEFORE ISSUING A FINAL NEGATIVE REPORT   Report Status PENDING   Incomplete     Studies/Results: Dg Chest 2 View  03/10/2011  *RADIOLOGY REPORT*  Clinical Data: Shortness of breath and fever.  CHEST - 2 VIEW  Comparison: Chest radiograph performed 02/03/2011  Findings: The lungs are well-aerated.  There is suggestion of mild lower lobe opacity on the lateral view.  There is no evidence of pleural effusion or pneumothorax.  The heart is normal in size; the mediastinal contour is within normal limits.  No acute osseous abnormalities are seen.  IMPRESSION: Suggestion of mild lower lobe opacity on the lateral view, which could reflect mild pneumonia given clinical concern.  Original Report Authenticated By: Tonia Ghent, [name]   Dg Abd 2 Views  03/10/2011  *RADIOLOGY REPORT*  Clinical Data: Recent lumbar spine fusion, ileus, constipation  ABDOMEN - 2 VIEW  Comparison: Intraoperative C-arm spot films of 03/05/2011  Findings: Supine and erect views of the abdomen show large and small bowel gas without significant distention. No bowel obstruction is seen and there is no evidence of free air.  A moderate amount of feces is noted throughout the colon.  Hardware for fusion from L3-L5 is noted.  IMPRESSION: Moderate amount of feces throughout the colon.  No obstruction or free air.   Original Report Authenticated By: Juline Patch, [name]     Assessment/Plan: diet controlled DM  hypothyroid  L4-5 decompression//fusion Jan 2006  L3-5 decompression/fusion 03-05-11. By 03-07-11 she developed temp to 100.9. Fever PEN allergy- throat closes up, breaks up in hives Anbx day 2 Vanco/ cipro flat and upright abd- no ileus.  LE dopplers- pending If no clear source could consider ct chest/abd/pelvis? (PE?) watch WBC (dropping)              Johny Sax Infectious Diseases [phone] 03/11/2011, 10:51 AM

## 2011-03-11 NOTE — Progress Notes (Signed)
Utilization review completed. Anette Guarneri, RN, BSN. 03/11/11

## 2011-03-11 NOTE — Progress Notes (Signed)
Bilateral lower extremity venous duplex completed.  Preliminary report is negative for DVT, SVT, or a Baker's cyst. 

## 2011-03-11 NOTE — Progress Notes (Signed)
03/11/11 2055 RT went to Pt's room to draw Stat ABG, pt not there. RT asked at desk where pt had gone, reply was pt has probably gone to Megan [name]. RT will check back in a few minutes to see if pt has returned or if pt has been transferred. RT will continue to monitor.

## 2011-03-11 NOTE — Progress Notes (Signed)
INFECTIOUS DISEASE PROGRESS NOTE  ID: Megan [name] is a 46 y.o. female with  Active Problems:  * No active hospital problems. *   Subjective: C/o feeling poorly. Having rigors.   Abtx:  Anti-infectives     Start     Dose/Rate Route Frequency Ordered Stop   03/12/11 2000   ciprofloxacin (CIPRO) IVPB 400 mg  Status:  Discontinued        400 mg 200 mL/hr over 60 Minutes Intravenous Every 12 hours 03/11/11 1836 03/11/11 1837   03/11/11 2000   ciprofloxacin (CIPRO) IVPB 400 mg        400 mg 200 mL/hr over 60 Minutes Intravenous Every 12 hours 03/11/11 1837 03/18/11 0759   03/11/11 0500   vancomycin (VANCOCIN) IVPB 1000 mg/200 mL premix        1,000 mg 200 mL/hr over 60 Minutes Intravenous Every 12 hours 03/10/11 1621     03/10/11 1700   ciprofloxacin (CIPRO) IVPB 400 mg  Status:  Discontinued        400 mg 200 mL/hr over 60 Minutes Intravenous Every 12 hours 03/10/11 1605 03/11/11 1836   03/10/11 1630   vancomycin (VANCOCIN) 2,500 mg in sodium chloride 0.9 % 500 mL IVPB        2,500 mg 250 mL/hr over 120 Minutes Intravenous  Once 03/10/11 1621 03/10/11 2114   03/10/11 1200   moxifloxacin (AVELOX) IVPB 400 mg  Status:  Discontinued        400 mg 250 mL/hr over 60 Minutes Intravenous Every 24 hours 03/10/11 1117 03/10/11 1605   03/09/11 1200   fluconazole (DIFLUCAN) tablet 150 mg        150 mg Oral  Once 03/09/11 0918 03/09/11 1129   03/05/11 2200   vancomycin (VANCOCIN) 1,750 mg in sodium chloride 0.9 % 500 mL IVPB        1,750 mg 250 mL/hr over 120 Minutes Intravenous  Once 03/05/11 1653 03/06/11 0032   03/05/11 0945   vancomycin (VANCOCIN) 500 mg in sodium chloride 0.9 % 100 mL IVPB  Status:  Discontinued        500 mg 100 mL/hr over 60 Minutes Intravenous To Surgery 03/05/11 0939 03/05/11 1524   03/05/11 0935   bacitracin 50,000 Units in sodium chloride irrigation 0.9 % 500 mL irrigation  Status:  Discontinued          As needed 03/05/11 0935 03/05/11 1255   03/05/11  0834   vancomycin (VANCOCIN) 1 GM/200ML IVPB     Comments: Sharlene Dory (ELENA): cabinet override         03/05/11 0834 03/05/11 0950   03/05/11 0828   bacitracin 16109 UNITS injection     Comments: Worthy Flank: cabinet override         03/05/11 0828 03/05/11 2029   03/04/11 1547   vancomycin (VANCOCIN) 1,500 mg in sodium chloride 0.9 % 500 mL IVPB  Status:  Discontinued        1,500 mg 250 mL/hr over 120 Minutes Intravenous 120 min pre-op 03/04/11 1547 03/05/11 0938          Medications:  Scheduled:   . ciprofloxacin  400 mg Intravenous Q12H  . docusate sodium  100 mg Oral BID  . gabapentin  400 mg Oral TID  . levothyroxine  175 mcg Oral Q0600  . lisinopril  10 mg Oral Daily  . metaxalone  800 mg Oral TID  . metoprolol succinate  50 mg Oral Daily  . vancomycin  2,500 mg Intravenous Once  . vancomycin  1,000 mg Intravenous Q12H  . DISCONTD: ciprofloxacin  400 mg Intravenous Q12H  . DISCONTD: ciprofloxacin  400 mg Intravenous Q12H    Objective: Vital signs in last 24 hours: Temp:  [98.2 F (36.8 C)-103.6 F (39.8 C)] 98.2 F (36.8 C) (02/26 1152) Pulse Rate:  [73-82] 75  (02/26 1152) Resp:  [18-21] 18  (02/26 1152) BP: (110-126)/(68-80) 126/74 mmHg (02/26 1152) SpO2:  [90 %-96 %] 94 % (02/26 1545)   General appearance: moderate distress and toxic Throat: dry Back: spinal wound is lightly more erythematous. tenderness unchanged.  Resp: clear to auscultation bilaterally Cardio: regular rate and rhythm GI: normal findings: bowel sounds normal and soft, non-tender Skin: ? livido on UE  Lab Results  Basename 04/04/11 0923  WBC 3.6*  HGB 10.8*  HCT 31.8*  NA --  K --  CL --  CO2 --  BUN --  CREATININE --  GLU --   Liver Panel No results found for this basename: PROT:2,ALBUMIN:2,AST:2,ALT:2,ALKPHOS:2,BILITOT:2,BILIDIR:2,IBILI:2 in the last 72 hours Sedimentation Rate No results found for this basename: ESRSEDRATE in the last 72 hours C-Reactive  Protein No results found for this basename: CRP:2 in the last 72 hours  Microbiology: Recent Results (from the past 240 hour(s))  CULTURE, BLOOD (ROUTINE X 2)     Status: Normal (Preliminary result)   Collection Time   04-Apr-2011  9:22 AM      Component Value Range Status Comment   Specimen Description BLOOD LEFT ARM   Final    Special Requests BOTTLES DRAWN AEROBIC AND ANAEROBIC 10CC   Final    Culture  Setup Time [id number]   Final    Culture     Final    Value:        BLOOD CULTURE RECEIVED NO GROWTH TO DATE CULTURE WILL BE HELD FOR 5 DAYS BEFORE ISSUING A FINAL NEGATIVE REPORT   Report Status PENDING   Incomplete   CULTURE, BLOOD (ROUTINE X 2)     Status: Normal (Preliminary result)   Collection Time   04-Apr-2011  9:28 AM      Component Value Range Status Comment   Specimen Description BLOOD RIGHT ARM   Final    Special Requests BOTTLES DRAWN AEROBIC AND ANAEROBIC 10CC   Final    Culture  Setup Time [id number]   Final    Culture     Final    Value:        BLOOD CULTURE RECEIVED NO GROWTH TO DATE CULTURE WILL BE HELD FOR 5 DAYS BEFORE ISSUING A FINAL NEGATIVE REPORT   Report Status PENDING   Incomplete     Studies/Results: Dg Abd 2 Views  Apr 04, 2011  *RADIOLOGY REPORT*  Clinical Data: Recent lumbar spine fusion, ileus, constipation  ABDOMEN - 2 VIEW  Comparison: Intraoperative C-arm spot films of 03/05/2011  Findings: Supine and erect views of the abdomen show large and small bowel gas without significant distention. No bowel obstruction is seen and there is no evidence of free air.  A moderate amount of feces is noted throughout the colon.  Hardware for fusion from L3-L5 is noted.  IMPRESSION: Moderate amount of feces throughout the colon.  No obstruction or free air.  Original Report Authenticated By: Juline Patch, [name]     Assessment/Plan: diet controlled DM  hypothyroid  L4-5 decompression//fusion Jan 2006 L3-5 decompression/fusion 03-05-11. By 03-07-11 she developed temp to  100.9.  Fever  PEN allergy- throat closes up, breaks  up in hives  Anbx day 2  Vanco/ cipro  She is Toxic now. Will change her Anbx to aztreonam and clinda and vanco. i have asked ICU to evaluate her (my great appreciation to them).  Recheck her BCx.  CT of Spine to eval. Recheck her CBC, CMP.    Johny Sax Infectious Diseases [phone] 03/11/2011, 6:49 PM

## 2011-03-11 NOTE — Progress Notes (Signed)
Physical Therapy Treatment Patient Details Name: Megan [name] MRN: [id number] DOB: April 17, 1965 Today's Date: 03/11/2011  PT Assessment/Plan  PT - Assessment/Plan Comments on Treatment Session: Pt presents s/p PLIF. Pt continues to require cues for back precautions and safety cues. Pt very fatigued today and required max encouragement and education for mobility and to sit in chair post treatment 45 min.  PT Plan: Discharge plan remains appropriate;Frequency remains appropriate PT Frequency: Min 5X/week Follow Up Recommendations: Home health PT;Supervision - Intermittent Equipment Recommended: Rolling walker with 5" wheels PT Goals  Acute Rehab PT Goals Pt will Roll Supine to Right Side: Independently PT Goal: Rolling Supine to Right Side - Progress: Progressing toward goal Pt will go Supine/Side to Sit: Independently PT Goal: Supine/Side to Sit - Progress: Progressing toward goal Pt will go Sit to Stand: with modified independence;with upper extremity assist PT Goal: Sit to Stand - Progress: Progressing toward goal Pt will Ambulate: >150 feet;with modified independence;with rolling walker PT Goal: Ambulate - Progress: Not progressing Pt will Go Up / Down Stairs: 3-5 stairs;with min assist;with least restrictive assistive device PT Goal: Up/Down Stairs - Progress: Not met Additional Goals Additional Goal #1: pt will verbalize and follow 3/3 back precautions.   PT Goal: Additional Goal #1 - Progress: Progressing toward goal  PT Treatment Precautions/Restrictions  Precautions Precautions: Back Precaution Booklet Issued: No Precaution Comments: Verbally reviewed back precautions, pt continues to require cues for adherence during functional activity Required Braces or Orthoses: Yes Spinal Brace: Lumbar corset;Applied in sitting position Restrictions Weight Bearing Restrictions: No Mobility (including Balance) Bed Mobility Right Sidelying to Sit: 5: Supervision;HOB flat Right  Sidelying to Sit Details (indicate cue type and reason): Cues to reinforce precautions & technique Sitting - Scoot to Edge of Bed: 5: Supervision Sitting - Scoot to Edge of Bed Details (indicate cue type and reason): Cues to reinforce precautions & technique Sit to Sidelying Right:  (Min Guard (A)) Transfers Transfers: Yes Sit to Stand: 5: Supervision;From bed Sit to Stand Details (indicate cue type and reason): cues for use of UEs, cues to minimize excessive forward trunk flexion. Stand to Sit: 5: Supervision;With armrests;To chair/3-in-1 Stand to Sit Details: Verbal cues for use of armrests, control of descent Ambulation/Gait Ambulation/Gait: Yes Ambulation/Gait Assistance: Other (comment) (Min Guard (A)) Ambulation/Gait Assistance Details (indicate cue type and reason): cues for upright posture, min-guard assist secondary to pt c/o weakness and nausea. Encouragement for increased distance.  Ambulation Distance (Feet): 75 Feet Assistive device: Rolling walker Gait Pattern: Decreased step length - right;Decreased step length - left;Decreased hip/knee flexion - left;Decreased hip/knee flexion - right Stairs: No Wheelchair Mobility Wheelchair Mobility: No  Posture/Postural Control Posture/Postural Control: No significant limitations Balance Balance Assessed: No Exercise  General Exercises - Lower Extremity Ankle Circles/Pumps: AROM;Both;10 reps;Seated End of Session PT - End of Session Equipment Utilized During Treatment: Back brace;Gait belt Activity Tolerance: Patient limited by fatigue Patient left: with call bell in reach;in chair Nurse Communication: Mobility status for transfers;Mobility status for ambulation General Behavior During Session: [facility] for tasks performed Cognition: [facility] for tasks performed  Sherie Don 03/11/2011, 4:51 PM  Sherie Don) Carleene Mains PT, DPT Acute Rehabilitation [phone]

## 2011-03-11 NOTE — Progress Notes (Signed)
Patient ID: Megan [name], female   DOB: 1965/09/16, 46 y.o.   MRN: [id number] Subjective:  The patient complains of nausea and vomiting. She also complains of left leg pain.  Objective: Vital signs in last 24 hours: Temp:  [99.3 F (37.4 C)-101.7 F (38.7 C)] 99.3 F (37.4 C) (02/26 0600) Pulse Rate:  [70-79] 73  (02/26 0600) Resp:  [18-21] 19  (02/26 0600) BP: (98-122)/(62-77) 122/77 mmHg (02/26 0600) SpO2:  [93 %-96 %] 93 % (02/26 0600)  Intake/Output from previous day: 02/25 0701 - 02/26 0700 In: 1000 [P.O.:1000] Out: -  Intake/Output this shift:    Physical exam the patient's incision is healing well without signs of infection. Her lower extremity motor strength is grossly normal.  Lab Results:  Basename 03/10/11 0923  WBC 3.6*  HGB 10.8*  HCT 31.8*  PLT 130*   BMET No results found for this basename: NA:2,K:2,CL:2,CO2:2,GLUCOSE:2,BUN:2,CREATININE:2,CALCIUM:2 in the last 72 hours  Studies/Results: Dg Chest 2 View  03/10/2011  *RADIOLOGY REPORT*  Clinical Data: Shortness of breath and fever.  CHEST - 2 VIEW  Comparison: Chest radiograph performed 02/03/2011  Findings: The lungs are well-aerated.  There is suggestion of mild lower lobe opacity on the lateral view.  There is no evidence of pleural effusion or pneumothorax.  The heart is normal in size; the mediastinal contour is within normal limits.  No acute osseous abnormalities are seen.  IMPRESSION: Suggestion of mild lower lobe opacity on the lateral view, which could reflect mild pneumonia given clinical concern.  Original Report Authenticated By: Tonia Ghent, [name]   Dg Abd 2 Views  03/10/2011  *RADIOLOGY REPORT*  Clinical Data: Recent lumbar spine fusion, ileus, constipation  ABDOMEN - 2 VIEW  Comparison: Intraoperative C-arm spot films of 03/05/2011  Findings: Supine and erect views of the abdomen show large and small bowel gas without significant distention. No bowel obstruction is seen and there is no evidence of  free air.  A moderate amount of feces is noted throughout the colon.  Hardware for fusion from L3-L5 is noted.  IMPRESSION: Moderate amount of feces throughout the colon.  No obstruction or free air.  Original Report Authenticated By: Juline Patch, [name]    Assessment/Plan: Postop day #6: I will add/increased Neurontin for her leg pain. This should resolve with time. We are going get a lower extremity ultrasound to rule out DVT.  Fever: I appreciate Dr. Moshe Cipro help with this. We'll continue empiric antibiotics.  LOS: 6 days     Megan [name] D 03/11/2011, 8:06 AM
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
## 2011-03-12 ENCOUNTER — Inpatient Hospital Stay (HOSPITAL_COMMUNITY): Payer: PRIVATE HEALTH INSURANCE

## 2011-03-12 LAB — COMPREHENSIVE METABOLIC PANEL
ALT: 9 U/L (ref 0–35)
AST: 16 U/L (ref 0–37)
Albumin: 2.3 g/dL — ABNORMAL LOW (ref 3.5–5.2)
Alkaline Phosphatase: 71 U/L (ref 39–117)
BUN: 5 mg/dL — ABNORMAL LOW (ref 6–23)
CO2: 28 mEq/L (ref 19–32)
Calcium: 8.4 mg/dL (ref 8.4–10.5)
Chloride: 105 mEq/L (ref 96–112)
Creatinine, Ser: 0.39 mg/dL — ABNORMAL LOW (ref 0.50–1.10)
GFR calc Af Amer: >90 mL/min (ref >90)
GFR calc non Af Amer: >90 mL/min (ref >90)
Glucose, Bld: 114 mg/dL — ABNORMAL HIGH (ref 70–99)
Potassium: 4.1 mEq/L (ref 3.5–5.1)
Sodium: 140 mEq/L (ref 135–145)
Total Bilirubin: 0.4 mg/dL (ref 0.3–1.2)
Total Protein: 6 g/dL (ref 6.0–8.3)

## 2011-03-12 LAB — CBC
HCT: 32.6 % — ABNORMAL LOW (ref 36.0–46.0)
Hemoglobin: 11.1 g/dL — ABNORMAL LOW (ref 12.0–15.0)
MCH: 29.7 pg (ref 26.0–34.0)
MCHC: 34 g/dL (ref 30.0–36.0)
MCV: 87.2 fL (ref 78.0–100.0)
Platelets: 121 K/uL — ABNORMAL LOW (ref 150–400)
RBC: 3.74 MIL/uL — ABNORMAL LOW (ref 3.87–5.11)
RDW: 13.7 % (ref 11.5–15.5)
WBC: 2.8 K/uL — ABNORMAL LOW (ref 4.0–10.5)

## 2011-03-12 LAB — DIFFERENTIAL
Basophils Absolute: 0 K/uL (ref 0.0–0.1)
Basophils Relative: 0 % (ref 0–1)
Eosinophils Absolute: 0.2 K/uL (ref 0.0–0.7)
Eosinophils Relative: 8 % — ABNORMAL HIGH (ref 0–5)
Lymphocytes Relative: 17 % (ref 12–46)
Lymphs Abs: 0.5 K/uL — ABNORMAL LOW (ref 0.7–4.0)
Monocytes Absolute: 0.4 K/uL (ref 0.1–1.0)
Monocytes Relative: 14 % — ABNORMAL HIGH (ref 3–12)
Neutro Abs: 1.7 K/uL (ref 1.7–7.7)
Neutrophils Relative %: 60 % (ref 43–77)

## 2011-03-12 LAB — LACTIC ACID, PLASMA: Lactic Acid, Venous: 1 mmol/L (ref 0.5–2.2)

## 2011-03-12 LAB — MAGNESIUM: Magnesium: 1.8 mg/dL (ref 1.5–2.5)

## 2011-03-12 LAB — PROCALCITONIN: Procalcitonin: <0.1 ng/mL

## 2011-03-12 LAB — PHOSPHORUS: Phosphorus: 3.3 mg/dL (ref 2.3–4.6)

## 2011-03-12 LAB — VANCOMYCIN, TROUGH: Vancomycin Tr: 7.9 ug/mL — ABNORMAL LOW (ref 10.0–20.0)

## 2011-03-12 MED ORDER — VANCOMYCIN HCL IN DEXTROSE 1-5 GM/200ML-% IV SOLN
1000.0000 mg | Freq: Three times a day (TID) | INTRAVENOUS | Status: DC
  Administered 2011-03-13 (×2): 1000 mg via INTRAVENOUS
  Filled 2011-03-12 (×3): qty 200

## 2011-03-12 MED ORDER — IOHEXOL 350 MG/ML SOLN
80.0000 mL | Freq: Once | INTRAVENOUS | Status: AC | PRN
  Administered 2011-03-12: 80 mL via INTRAVENOUS

## 2011-03-12 MED ORDER — IOHEXOL 300 MG/ML  SOLN: 20.0000 mL | INTRAMUSCULAR | Status: AC

## 2011-03-12 NOTE — Progress Notes (Signed)
Patient ID: Megan [name], female   DOB: 10-Jul-1965, 46 y.o.   MRN: [id number] Subjective:  The patient is alert and pleasant. She is does not appear particularly ill. He still having some left leg pain.  Objective: Vital signs in last 24 hours: Temp:  [98.2 F (36.8 C)-103.1 F (39.5 C)] 99.6 F (37.6 C) (02/27 0600) Pulse Rate:  [55-77] 55  (02/27 0600) Resp:  [18-21] 18  (02/27 0600) BP: (101-126)/(63-80) 105/63 mmHg (02/27 0600) SpO2:  [86 %-98 %] 96 % (02/27 0600)  Intake/Output from previous day: 02/26 0701 - 02/27 0700 In: 720 [P.O.:720] Out: -  Intake/Output this shift:    Physical exam the patient's incision is healing well without any signs of infection.  Lab Results:  Basename 03/11/11 2000 03/10/11 0923  WBC 2.8* 3.6*  HGB 11.0* 10.8*  HCT 32.1* 31.8*  PLT 114* 130*   BMET  Basename 03/11/11 2000  NA 136  K 3.0*  CL 99  CO2 30  GLUCOSE 106*  BUN 7  CREATININE 0.48*  CALCIUM 8.0*    Studies/Results: Ct Lumbar Spine W Contrast  03/12/2011  *RADIOLOGY REPORT*  Clinical Data: Fever.  Increased weakness.  Lumbar fusion 1 week ago.  CT LUMBAR SPINE WITH CONTRAST  Technique:  Multidetector CT imaging of the lumbar spine was performed with intravenous contrast administration. Multiplanar CT image reconstructions were also generated.  Contrast:  100 ml Omnipaque-300  Comparison: 10/12/2009.  Findings: The numbering convention used on prior exam is preserved. Postoperative changes are present at L3-L4 compatible with recent discectomy and fusion.  L4-L5 fusion was present on the prior exam. There is bridging bone across the disc space at L4-L5.  There is a posterior subcutaneous fluid collection that measures 48 mm transverse, 24 mm AP and pulse 13 cm cranial-caudal.  This probably represents seroma in the setting of recent operation. Hematoma and abscess are considered less likely.  The paraspinal soft tissues are within normal limits. T10-T11 through L2-L3 appears  similar to prior examination.  L2-L3 shows a shallow circumferential disc bulging.  L3-L4:  Discectomy with bilateral laminotomies.  Small locule of gas is present in the right laminotomy bed (image 80 series 5). Central canal is poorly evaluated due to artifact from interbody bone graft markers.  The L3 pedicle screws appear appropriately located.  There is no hardware failure.  Morcellized posterolateral bone graft is present.  L4-L5:  Discectomy with solid posterolateral fusion.  Confluent bone graft is present.  Hardware appears intact and in good position.  No interval change compared to prior.  L5-S1:  Minimal disc bulging.  No stenosis.  Mild bilateral facet arthrosis.  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  IMPRESSION: 1.  Postoperative changes of recent L3-L4 bilateral laminotomies and discectomy.  No complicating features identified allowing for artifact which degrades evaluation of the operative level. 2.  Solid fusion at L4-L5.  Fusion hardware extends from L3-L5. There is no evidence of hardware loosening or failure. 3.  Large posterior subcutaneous fluid collection probably representing postoperative seroma.  Hematoma and abscess are in the differential considerations.  Original Report Authenticated By: Andreas Newport, [name]   Dg Chest Port 1 View  03/11/2011  *RADIOLOGY REPORT*  Clinical Data: Shortness of breath, fever and back pain.  PORTABLE CHEST - 1 VIEW  Comparison: Chest radiograph performed 03/09/2011  Findings: The lungs are well-aerated.  There is suggestion of mild medial right basilar airspace opacity, which could reflect mild pneumonia.  There  is no evidence of pleural effusion or pneumothorax.  The cardiomediastinal silhouette is within normal limits.  No acute osseous abnormalities are seen.  IMPRESSION: Mild medial right basilar airspace opacity raises concern for mild pneumonia.  Original Report Authenticated By: Tonia Ghent, [name]   Dg Abd 2  Views  03/10/2011  *RADIOLOGY REPORT*  Clinical Data: Recent lumbar spine fusion, ileus, constipation  ABDOMEN - 2 VIEW  Comparison: Intraoperative C-arm spot films of 03/05/2011  Findings: Supine and erect views of the abdomen show large and small bowel gas without significant distention. No bowel obstruction is seen and there is no evidence of free air.  A moderate amount of feces is noted throughout the colon.  Hardware for fusion from L3-L5 is noted.  IMPRESSION: Moderate amount of feces throughout the colon.  No obstruction or free air.  Original Report Authenticated By: Juline Patch, [name]   I  Assessment/Plan: Postop day #7: I reviewed the patient's lumbar CT. I don't see any particularly worrisome problems. The subcutaneous fluid collection is expected with her surgery being a few days ago and it's a repeat surgery. I am therefore reluctant to explore the wound or send her  for a needle aspiration, unless ID thinks strongly that this needs to be done.  LOS: 7 days     Alver Leete D 03/12/2011, 9:18 AM
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
## 2011-03-12 NOTE — Progress Notes (Signed)
Pt filled out release of medical record form in order to obtain records from [facility]. Info passed to unit secretary to contact [facility]. Will follow up.

## 2011-03-12 NOTE — Progress Notes (Signed)
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  Post-operatively, she developed low-grade fevers and was started on Avelox 02/25 for ?pneumonia and ID consulted in light of history of autoimmune hepatitis.  Fevers now worse to temperature of 103s despite vancomycin/ciprofloxacin, patient complaining of rigors, and CCM asked to consult due to concern for sepsis.  Lines / Drains: 02/26 PIV>>>  Cultures: 2/25 Blood cultures>>> 2/26 Urine cultures>>> 2/26 Blood cultures>>>   Lab 03/11/11 2000 03/10/11 0923 03/06/11 0606  WBC 2.8* 3.6* 7.2    Lab 03/12/11 0655  PROCALCITON <0.10     Results for orders placed during the hospital encounter of 03/05/11  CULTURE, BLOOD (ROUTINE X 2)     Status: Normal (Preliminary result)   Collection Time   03/10/11  9:22 AM      Component Value Range Status Comment   Specimen Description BLOOD LEFT ARM   Final    Special Requests BOTTLES DRAWN AEROBIC AND ANAEROBIC 10CC   Final    Culture  Setup Time [id number]   Final    Culture     Final    Value:        BLOOD CULTURE RECEIVED NO GROWTH TO DATE CULTURE WILL BE HELD FOR 5 DAYS BEFORE ISSUING A FINAL NEGATIVE REPORT   Report Status PENDING   Incomplete   CULTURE, BLOOD (ROUTINE X 2)     Status: Normal (Preliminary result)   Collection Time   03/10/11  9:28 AM      Component Value Range Status Comment   Specimen Description BLOOD RIGHT ARM   Final    Special Requests BOTTLES DRAWN AEROBIC AND ANAEROBIC 10CC   Final    Culture  Setup Time [id number]   Final    Culture     Final    Value:        BLOOD CULTURE RECEIVED NO GROWTH TO DATE  CULTURE WILL BE HELD FOR 5 DAYS BEFORE ISSUING A FINAL NEGATIVE REPORT   Report Status PENDING   Incomplete      Antibiotics: 2/26 Aztreonam>>> 2/25 vancomycin>>> 2/26 Clindamycin>>> 2/25 Avelox>>>2/25 2/26 Ciprofloxacin>>>2/26  Anti-infectives     Start     Dose/Rate Route Frequency Ordered Stop   03/12/11 2000   ciprofloxacin (CIPRO) IVPB 400 mg  Status:  Discontinued        400 mg 200 mL/hr over 60 Minutes Intravenous Every 12 hours 03/11/11 1836 03/11/11 1837   03/11/11 2200   clindamycin (CLEOCIN) IVPB 300 mg        300 mg 100 mL/hr over 30 Minutes Intravenous 3 times per day 03/11/11 1855     03/11/11 2200   imipenem-cilastatin (PRIMAXIN) 500 mg in sodium chloride 0.9 % 100 mL IVPB  Status:  Discontinued        500 mg 200 mL/hr over 30 Minutes Intravenous 3 times per day 03/11/11 1855 03/11/11 1858   03/11/11 2000   ciprofloxacin (CIPRO) IVPB 400 mg  Status:  Discontinued        400 mg 200 mL/hr over 60 Minutes Intravenous Every 12 hours 03/11/11 1837 03/11/11 1855   03/11/11 2000   aztreonam (AZACTAM) injection 2 g  Status:  Discontinued        2 g Intramuscular Every 8 hours 03/11/11 1858 03/11/11 1909   03/11/11 2000   aztreonam (AZACTAM) 2 g in dextrose 5 % 50 mL IVPB        2 g 100 mL/hr over 30 Minutes Intravenous Every 8 hours 03/11/11 1910     03/11/11 0500   vancomycin (VANCOCIN) IVPB 1000 mg/200 mL premix        1,000 mg 200 mL/hr over 60 Minutes Intravenous Every 12 hours 03/10/11 1621     03/10/11 1700   ciprofloxacin (CIPRO) IVPB 400 mg  Status:  Discontinued        400 mg 200 mL/hr over 60 Minutes Intravenous Every 12 hours 03/10/11 1605 03/11/11 1836   03/10/11 1630   vancomycin (VANCOCIN) 2,500 mg in sodium chloride 0.9 % 500 mL IVPB        2,500 mg 250 mL/hr over 120 Minutes Intravenous  Once 03/10/11 1621 03/10/11 2114   03/10/11 1200   moxifloxacin (AVELOX) IVPB 400 mg  Status:  Discontinued        400 mg 250 mL/hr over 60 Minutes  Intravenous Every 24 hours 03/10/11 1117 03/10/11 1605   03/09/11 1200   fluconazole (DIFLUCAN) tablet 150 mg        150 mg Oral  Once 03/09/11 0918 03/09/11 1129   03/05/11 2200   vancomycin (VANCOCIN) 1,750 mg in sodium chloride 0.9 % 500 mL IVPB        1,750 mg 250 mL/hr over 120 Minutes Intravenous  Once 03/05/11 1653 03/06/11 0032   03/05/11 0945   vancomycin (VANCOCIN) 500 mg in sodium chloride 0.9 % 100 mL IVPB  Status:  Discontinued        500 mg 100 mL/hr over 60 Minutes Intravenous To Surgery 03/05/11 0939 03/05/11 1524   03/05/11 0935   bacitracin 50,000 Units in sodium chloride irrigation 0.9 % 500 mL irrigation  Status:  Discontinued          As needed 03/05/11 0935 03/05/11 1255   03/05/11 0834   vancomycin (VANCOCIN) 1 GM/200ML IVPB     CommentsSharlene Dory (ELENA): cabinet override         03/05/11 0834 03/05/11 0950   03/05/11 0828   bacitracin 16109 UNITS injection     Comments: Worthy Flank: cabinet override         03/05/11 0828 03/05/11 2029   03/04/11 1547   vancomycin (VANCOCIN) 1,500 mg in sodium chloride 0.9 % 500 mL IVPB  Status:  Discontinued        1,500 mg 250 mL/hr over 120 Minutes Intravenous 120 min pre-op 03/04/11 1547 03/05/11 0938           Tests / Events: 2/25 CXR: mild lower lobe opacity? Pulmonary vascular congestion. 2/25 AXR: no obstruction/free air 2/26 CT Spine: by report no fluid collection  SUBJECTIVE/ INTERVAL/ OVERNIGHT HX    She feels fevers are improving but unclear if curve is temporarly dip or permanent change Gives hxof negative SLE and RA workup under DR Z at [facility] Wants me to inform Dr Elnoria Howard (her GI) and and Dr Myna Hidalgo about her admission Due to have CT(pan) LLE is weak and per RN and patient - neurosurg aware   Vital Signs: Temp:  [98.2 F (36.8 C)-103.1 F (39.5 C)] 99.6 F (37.6 C) (02/27 0600) Pulse Rate:  [55-77] 55  (02/27 0600) Resp:  [18-21] 18  (02/27 0600) BP: (101-126)/(63-80) 105/63 mmHg (  02/27  0600) SpO2:  [86 %-98 %] 96 % (02/27 0600) I/O last 3 completed shifts: In: 720 [P.O.:720] Out: -   Physical Examination: General:  Appears uncomfortable Neuro:  Alert and oriented. LLE slightly weak. No evidence of rhabdo Psych: anxiopus + HEENT:  PERRL, pink conjunctivae, moist membranes Neck:  Supple Cardiovascular:  RRR no M/R/G Lungs:  No accessory muscle use, speaking in complete sentences, CTA B Abdomen:  Soft, nontender, nondistended, bowel sounds present Musculoskeletal:  Moves all extremities, no pedal edema Skin:  Appears flushed in face and upper extremities  Labs and Imaging:   Reviewed  ASSESSMENT AND PLAN The patient is a 46 year old Caucasian female with a history of lumbar spinal stenosis who underwent lumbar  laminotomy/foraminotomies and fusion on 02/21 and who now has fevers of unknown origin.  She may have pneumonia, but her CXR findings are not convincing and she has little cough.  Agree with plans to image surgical site and cover her for a wound infection.  If fevers continue despite several days of antibiotics would consider an autoimmune process (vasculitis, lupus?) given her history of autoimmune hepatitis.  Fevers   Lab 03/12/11 0655  PROCALCITON <0.10    Lab 03/11/11 2000 03/10/11 0923 03/06/11 0606  WBC 2.8* 3.6* 7.2    Lab 03/11/11 2000 03/06/11 0606  CREATININE 0.48* 0.45*     Lab 03/11/11 2000  AST 15  ALT 9  ALKPHOS 70  BILITOT 0.5  PROT 5.9*  ALBUMIN 2.5*  INR --    Fevers starting post-op; leukopenia. -->  Per ID  - await pan CT  - Get records from [facility]ologist at [facility]  - d/w Dr Elnoria Howard - given normal LFTs unlikely hepatitis cause of fevers - check lactate today  - repeat PCT 03/13/11  CARDIOVASCULAR History of hypertension -->  Home medications: lisinopril 10, metoprolol 50 bid>>>Receiving currently -->  Check troponin, ECG due to complaints of left-sided chest pain    Lab 03/11/11 2242  TROPONINI <0.30      RENAL  Lab 03/12/11 0655 03/11/11 2000 03/06/11 0606  NA -- 136 134*  K -- 3.0* 3.4*  CL -- 99 101  CO2 -- 30 28  GLUCOSE -- 106* 114*  BUN -- 7 6  CREATININE -- 0.48* 0.45*  CALCIUM -- 8.0* 8.5  MG 1.8 -- --  PHOS 3.3 -- --  -->  BMP in AM  GASTROINTESTINAL History of fatty liver and autoimmune hepatitis--seen by Dr. Elnoria Howard as outpatient History of gastroparesis -->  LFTs stable.   ENDOCRINE  Lab 03/05/11 1306  GLUCAP 108*  History of diet-controlled diabetes History of hypothyroidism -->  CBG checks/SSI -->  Home levothyroxine  Disposition: Do not think need to admit patient to ICU at this time.   Dr. Kalman Shan, [name], [facility].C.P Pulmonary and Critical Care Medicine Staff Physician [facility] Pager: [phone], If no answer or between  15:00h - 7:00h: call [phone]  03/12/2011 9:29 AM

## 2011-03-12 NOTE — Progress Notes (Signed)
Occupational Therapy Treatment Patient Details Name: Megan [name] MRN: [id number] DOB: 12-Apr-1965 Today's Date: 03/12/2011  OT Assessment/Plan OT Assessment/Plan Comments on Treatment Session: Pt required max encouragement to participate in session due to pt's c/o pain and nausea.  Educated pt on benefits of OOB activity and encouraged pt to sit up in chair 45 min. post-session. OT Plan: Discharge plan remains appropriate OT Frequency: Min 2X/week Follow Up Recommendations: Home health OT Equipment Recommended: Rolling walker with 5" wheels OT Goals ADL Goals Pt Will Perform Upper Body Dressing: with set-up;Sitting, chair;Sitting, bed;Unsupported ADL Goal: Upper Body Dressing - Progress: Met Pt Will Transfer to Toilet: with set-up;3-in-1 ADL Goal: Toilet Transfer - Progress: Progressing toward goals Pt Will Perform Toileting - Hygiene: with set-up;Sit to stand from 3-in-1/toilet ADL Goal: Toileting - Hygiene - Progress: Met Miscellaneous OT Goals Miscellaneous OT Goal #1: Pt will don / doff brace MOD I as precursor to ADLs and all mobility for basic transfers OT Goal: Miscellaneous Goal #1 - Progress: Progressing toward goals  OT Treatment Precautions/Restrictions  Precautions Precautions: Back Precaution Comments: Verbally reviewed back precautions, pt continues to require cues for adherence during functional activity Required Braces or Orthoses: Yes Spinal Brace: Lumbar corset Restrictions Weight Bearing Restrictions: No   ADL ADL Grooming: Performed;Supervision/safety;Wash/dry hands Where Assessed - Grooming: Standing at sink Upper Body Dressing: Performed;Set up Upper Body Dressing Details (indicate cue type and reason): Donned house coat with setup assist for safety with IV line Where Assessed - Upper Body Dressing: Standing Toilet Transfer: Performed;Other (comment) (min guard assist) Toilet Transfer Details (indicate cue type and reason): VC for maintaining back  precautions Toilet Transfer Method: Ambulating Toilet Transfer Equipment: Regular height toilet Toileting - Clothing Manipulation: Performed;Supervision/safety Toileting - Clothing Manipulation Details (indicate cue type and reason): Supervision for maintaining back precautions Where Assessed - Toileting Clothing Manipulation: Standing Toileting - Hygiene: Performed;Modified independent Where Assessed - Toileting Hygiene: Sit on 3-in-1 or toilet Equipment Used: Rolling walker ADL Comments: Pt donned slippers at EOB with setup by sliding feet in. Pt donned brace sitting EOB with supervision to maintain back precautions. Mobility  Bed Mobility Rolling Right: 5: Supervision;With rail Rolling Right Details (indicate cue type and reason): VC for technique and maintaining back precautions  Right Sidelying to Sit: 5: Supervision;With rails;HOB flat Right Sidelying to Sit Details (indicate cue type and reason): VC for maintaining back precautions  Sitting - Scoot to Edge of Bed: 5: Supervision Transfers Sit to Stand: 5: Supervision;With upper extremity assist;From bed;From toilet Sit to Stand Details (indicate cue type and reason): VC for safe hand placement Stand to Sit: 5: Supervision;To chair/3-in-1;To toilet;With upper extremity assist;With armrests Stand to Sit Details: VC for safe hand placement on armrests Exercises    End of Session OT - End of Session Equipment Utilized During Treatment: Back brace Activity Tolerance: Patient limited by pain;Other (comment) (nausea) Patient left: in chair;with call bell in reach Nurse Communication: Other (comment) (pt in chair) General Behavior During Session: [facility] for tasks performed Cognition: [facility] for tasks performed  Cipriano Mile  03/12/2011, 3:19 PM 03/12/2011 Cipriano Mile OTR/L Pager [phone] Office [phone]

## 2011-03-12 NOTE — Progress Notes (Signed)
Nursing- Pt very lethargic and c/o chest pain in the area under her left breast. She states that it feels "mushy." I asked her if this was bone or cardiac chest pain. I also asked if the pain was radiating anywhere. Pt stated, "I do not know, something just doesn't feel right."  PCCM MD notified he stated that the resident would be up shortly to assess patient. After resident's arrival, orders for 12lead EKG and cardiac enzymes was given. 12 lead performed. Will cont to monitor patient.

## 2011-03-12 NOTE — Progress Notes (Signed)
Physical Therapy Treatment Patient Details Name: DAWNETTE MIONE MRN: [id number] DOB: [birth year] Today's Date: 03/12/2011  PT Assessment/Plan  PT - Assessment/Plan Comments on Treatment Session: Pt presents s/p PLIF. Pt continues to require cues for back precautions however is moving very well. Pt fatigued again today with fever and required max encouragement and education for mobility. Pt strongly encouraged to sit in chair for all meals, encourage to stay in chair post session for at least 45 min. Pt not excited but willing. Pt reports she is comfortable going home, unhappy when PT brought up option of SNF. PT Plan: Discharge plan remains appropriate;Frequency remains appropriate PT Frequency: Min 5X/week Follow Up Recommendations: Home health PT;Supervision - Intermittent Equipment Recommended: Rolling walker with 5" wheels PT Goals  Acute Rehab PT Goals Pt will Roll Supine to Right Side: Independently PT Goal: Rolling Supine to Right Side - Progress: Progressing toward goal Pt will go Supine/Side to Sit: Independently PT Goal: Supine/Side to Sit - Progress: Progressing toward goal Pt will go Sit to Stand: with modified independence;with upper extremity assist PT Goal: Sit to Stand - Progress: Progressing toward goal Pt will Ambulate: >150 feet;with modified independence;with rolling walker PT Goal: Ambulate - Progress: Progressing toward goal Additional Goals Additional Goal #1: pt will verbalize and follow 3/3 back precautions and demonstrate adherence throughout session  PT Treatment Precautions/Restrictions  Precautions Precautions: Back Precaution Booklet Issued: No Precaution Comments: Verbally reviewed back precautions, pt continues to require cues for adherence during functional activity Required Braces or Orthoses: Yes Spinal Brace: Lumbar corset;Applied in sitting position Restrictions Weight Bearing Restrictions: No Mobility (including Balance) Bed Mobility Rolling  Right: 5: Supervision;With rail Rolling Right Details (indicate cue type and reason): VC for technique and maintaining back precautions  Right Sidelying to Sit: 5: Supervision;With rails;HOB flat Right Sidelying to Sit Details (indicate cue type and reason): VC for maintaining back precautions  Sitting - Scoot to Edge of Bed: 5: Supervision Sitting - Scoot to Edge of Bed Details (indicate cue type and reason): Pt with excessive lumbar flexion, cues to limit for pain modulation Transfers Transfers: Yes Sit to Stand: 5: Supervision;With upper extremity assist;From bed;From toilet Sit to Stand Details (indicate cue type and reason): VC for safe hand placement, limiting reaching with UEs to adhere to precautions Stand to Sit: 5: Supervision;To chair/3-in-1;To toilet;With upper extremity assist;With armrests Stand to Sit Details: Cues for control of descent and UE placement Ambulation/Gait Ambulation/Gait: Yes Ambulation/Gait Assistance: Other (comment) (min-guard assist) Ambulation/Gait Assistance Details (indicate cue type and reason): max verbal cues and encouragement to promote increased distance. Verbal cues for upright posture Ambulation Distance (Feet): 150 Feet Assistive device: Rolling walker Gait Pattern: Decreased stride length;Step-through pattern;Trunk flexed Stairs: No    End of Session General Behavior During Session: [facility] for tasks performed Cognition: [facility] for tasks performed  Sherie Don 03/12/2011, 4:37 PM  Dahlia Client ([name]) Carleene Mains PT, DPT Acute Rehabilitation [phone]

## 2011-03-12 NOTE — Progress Notes (Signed)
INFECTIOUS DISEASE PROGRESS NOTE  ID: Megan [name] is a 46 y.o. female with   Active Problems:  * No active hospital problems. *   Subjective: States she feels better. No BM. No chest heaviness.   Abtx:  Anti-infectives     Start     Dose/Rate Route Frequency Ordered Stop   03/12/11 2000   ciprofloxacin (CIPRO) IVPB 400 mg  Status:  Discontinued        400 mg 200 mL/hr over 60 Minutes Intravenous Every 12 hours 03/11/11 1836 03/11/11 1837   03/11/11 2200   clindamycin (CLEOCIN) IVPB 300 mg        300 mg 100 mL/hr over 30 Minutes Intravenous 3 times per day 03/11/11 1855     03/11/11 2200   imipenem-cilastatin (PRIMAXIN) 500 mg in sodium chloride 0.9 % 100 mL IVPB  Status:  Discontinued        500 mg 200 mL/hr over 30 Minutes Intravenous 3 times per day 03/11/11 1855 03/11/11 1858   03/11/11 2000   ciprofloxacin (CIPRO) IVPB 400 mg  Status:  Discontinued        400 mg 200 mL/hr over 60 Minutes Intravenous Every 12 hours 03/11/11 1837 03/11/11 1855   03/11/11 2000   aztreonam (AZACTAM) injection 2 g  Status:  Discontinued        2 g Intramuscular Every 8 hours 03/11/11 1858 03/11/11 1909   03/11/11 2000   aztreonam (AZACTAM) 2 g in dextrose 5 % 50 mL IVPB        2 g 100 mL/hr over 30 Minutes Intravenous Every 8 hours 03/11/11 1910     03/11/11 0500   vancomycin (VANCOCIN) IVPB 1000 mg/200 mL premix        1,000 mg 200 mL/hr over 60 Minutes Intravenous Every 12 hours 03/10/11 1621     03/10/11 1700   ciprofloxacin (CIPRO) IVPB 400 mg  Status:  Discontinued        400 mg 200 mL/hr over 60 Minutes Intravenous Every 12 hours 03/10/11 1605 03/11/11 1836   03/10/11 1630   vancomycin (VANCOCIN) 2,500 mg in sodium chloride 0.9 % 500 mL IVPB        2,500 mg 250 mL/hr over 120 Minutes Intravenous  Once 03/10/11 1621 03/10/11 2114   03/10/11 1200   moxifloxacin (AVELOX) IVPB 400 mg  Status:  Discontinued        400 mg 250 mL/hr over 60 Minutes Intravenous Every 24 hours 03/10/11  1117 03/10/11 1605   03/09/11 1200   fluconazole (DIFLUCAN) tablet 150 mg        150 mg Oral  Once 03/09/11 0918 03/09/11 1129   03/05/11 2200   vancomycin (VANCOCIN) 1,750 mg in sodium chloride 0.9 % 500 mL IVPB        1,750 mg 250 mL/hr over 120 Minutes Intravenous  Once 03/05/11 1653 03/06/11 0032   03/05/11 0945   vancomycin (VANCOCIN) 500 mg in sodium chloride 0.9 % 100 mL IVPB  Status:  Discontinued        500 mg 100 mL/hr over 60 Minutes Intravenous To Surgery 03/05/11 0939 03/05/11 1524   03/05/11 0935   bacitracin 50,000 Units in sodium chloride irrigation 0.9 % 500 mL irrigation  Status:  Discontinued          As needed 03/05/11 0935 03/05/11 1255   03/05/11 0834   vancomycin (VANCOCIN) 1 GM/200ML IVPB     Comments: WALKER, LUZ (ELENA): cabinet override  03/05/11 0834 03/05/11 0950   03/05/11 0828   bacitracin 62130 UNITS injection     Comments: Worthy Flank: cabinet override         03/05/11 0828 03/05/11 2029   03/04/11 1547   vancomycin (VANCOCIN) 1,500 mg in sodium chloride 0.9 % 500 mL IVPB  Status:  Discontinued        1,500 mg 250 mL/hr over 120 Minutes Intravenous 120 min pre-op 03/04/11 1547 03/05/11 0938          Medications:  Scheduled:   . aztreonam  2 g Intravenous Q8H  . clindamycin (CLEOCIN) IV  300 mg Intravenous Q8H  . docusate sodium  100 mg Oral BID  . gabapentin  400 mg Oral TID  . levothyroxine  175 mcg Oral Q0600  . lisinopril  10 mg Oral Daily  . metaxalone  800 mg Oral TID  . metoprolol succinate  50 mg Oral Daily  . potassium chloride  10 mEq Intravenous Q1 Hr x 6  . vancomycin  1,000 mg Intravenous Q12H  . DISCONTD: aztreonam  2 g Intramuscular Q8H  . DISCONTD: ciprofloxacin  400 mg Intravenous Q12H  . DISCONTD: ciprofloxacin  400 mg Intravenous Q12H  . DISCONTD: ciprofloxacin  400 mg Intravenous Q12H  . DISCONTD: imipenem-cilastatin  500 mg Intravenous Q8H  . DISCONTD: potassium chloride  30 mEq Oral BID     Objective: Vital signs in last 24 hours: Temp:  [98.2 F (36.8 C)-103.6 F (39.8 C)] 99.6 F (37.6 C) (02/27 0600) Pulse Rate:  [55-82] 55  (02/27 0600) Resp:  [18-21] 18  (02/27 0600) BP: (101-126)/(63-80) 105/63 mmHg (02/27 0600) SpO2:  [86 %-98 %] 96 % (02/27 0600)   Neck: states that her neck hurts with motion but she has free range of motion. ? posterior tenderness to palpation Resp: clear to auscultation bilaterally Chest wall: no xyphoid tenderness Cardio: regular rate and rhythm GI: normal findings: bowel sounds normal and soft, non-tender  Lab Results  Basename 03/11/11 2000 03/10/11 0923  WBC 2.8* 3.6*  HGB 11.0* 10.8*  HCT 32.1* 31.8*  NA 136 --  K 3.0* --  CL 99 --  CO2 30 --  BUN 7 --  CREATININE 0.48* --  GLU -- --   Liver Panel  Basename 03/11/11 2000  PROT 5.9*  ALBUMIN 2.5*  AST 15  ALT 9  ALKPHOS 70  BILITOT 0.5  BILIDIR --  IBILI --   Sedimentation Rate No results found for this basename: ESRSEDRATE in the last 72 hours C-Reactive Protein No results found for this basename: CRP:2 in the last 72 hours  Microbiology: Recent Results (from the past 240 hour(s))  CULTURE, BLOOD (ROUTINE X 2)     Status: Normal (Preliminary result)   Collection Time   03/10/11  9:22 AM      Component Value Range Status Comment   Specimen Description BLOOD LEFT ARM   Final    Special Requests BOTTLES DRAWN AEROBIC AND ANAEROBIC 10CC   Final    Culture  Setup Time [id number]   Final    Culture     Final    Value:        BLOOD CULTURE RECEIVED NO GROWTH TO DATE CULTURE WILL BE HELD FOR 5 DAYS BEFORE ISSUING A FINAL NEGATIVE REPORT   Report Status PENDING   Incomplete   CULTURE, BLOOD (ROUTINE X 2)     Status: Normal (Preliminary result)   Collection Time   03/10/11  9:28 AM  Component Value Range Status Comment   Specimen Description BLOOD RIGHT ARM   Final    Special Requests BOTTLES DRAWN AEROBIC AND ANAEROBIC 10CC   Final    Culture  Setup Time  [id number]   Final    Culture     Final    Value:        BLOOD CULTURE RECEIVED NO GROWTH TO DATE CULTURE WILL BE HELD FOR 5 DAYS BEFORE ISSUING A FINAL NEGATIVE REPORT   Report Status PENDING   Incomplete     Studies/Results: Dg Chest Port 1 View  03/11/2011  *RADIOLOGY REPORT*  Clinical Data: Shortness of breath, fever and back pain.  PORTABLE CHEST - 1 VIEW  Comparison: Chest radiograph performed 03/09/2011  Findings: The lungs are well-aerated.  There is suggestion of mild medial right basilar airspace opacity, which could reflect mild pneumonia.  There is no evidence of pleural effusion or pneumothorax.  The cardiomediastinal silhouette is within normal limits.  No acute osseous abnormalities are seen.  IMPRESSION: Mild medial right basilar airspace opacity raises concern for mild pneumonia.  Original Report Authenticated By: Tonia Ghent, [name]   Dg Abd 2 Views  03/10/2011  *RADIOLOGY REPORT*  Clinical Data: Recent lumbar spine fusion, ileus, constipation  ABDOMEN - 2 VIEW  Comparison: Intraoperative C-arm spot films of 03/05/2011  Findings: Supine and erect views of the abdomen show large and small bowel gas without significant distention. No bowel obstruction is seen and there is no evidence of free air.  A moderate amount of feces is noted throughout the colon.  Hardware for fusion from L3-L5 is noted.  IMPRESSION: Moderate amount of feces throughout the colon.  No obstruction or free air.  Original Report Authenticated By: Juline Patch, [name]     Assessment/Plan: Postoperative Fever, decreasing WBC, worsening mental status  diet controlled DM  hypothyroid  L4-5 decompression//fusion Jan 2006 L3-5 decompression/fusion 03-05-11. By 03-07-11 she developed temp to 100.9.  PEN allergy- throat closes up, breaks up in hives  Anbx day 3  Vanco/ aztreonam/clinda  Spoke with radiology, no significant, gross findings on CT spine. Suggest MRI if concerned. Will defer to Neurosurgery.  Will  check CT chest/abd/pelvis.  Consider CT head? Await her Cx's My great appreciation to CCM   Johny Sax Infectious Diseases [phone] 03/12/2011, 7:43 AM

## 2011-03-13 DIAGNOSIS — R509 Fever, unspecified: Secondary | ICD-10-CM

## 2011-03-13 DIAGNOSIS — J13 Pneumonia due to Streptococcus pneumoniae: Secondary | ICD-10-CM

## 2011-03-13 LAB — URINE CULTURE
Colony Count: NO GROWTH
Culture  Setup Time: 201302271036
Culture: NO GROWTH
Special Requests: NORMAL

## 2011-03-13 LAB — PROCALCITONIN: Procalcitonin: <0.1 ng/mL

## 2011-03-13 MED ORDER — HYDROMORPHONE HCL PF 1 MG/ML IJ SOLN
2.0000 mg | INTRAMUSCULAR | Status: DC | PRN
  Filled 2011-03-13: qty 2
  Filled 2011-03-13: qty 4

## 2011-03-13 MED ORDER — SODIUM CHLORIDE 0.9 % IV SOLN
6.0000 mg/kg | INTRAVENOUS | Status: DC
  Administered 2011-03-13 – 2011-03-16 (×4): 702 mg via INTRAVENOUS
  Filled 2011-03-13 (×7): qty 14.04
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
## 2011-03-13 NOTE — Progress Notes (Signed)
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  Post-operatively, she developed low-grade fevers and was started on Avelox 02/25 for ?pneumonia and ID consulted in light of history of autoimmune hepatitis.  Fevers now worse to temperature of 103s despite vancomycin/ciprofloxacin, patient complaining of rigors, and CCM asked to consult due to concern for sepsis.  Lines / Drains: 02/26 PIV>>>  Cultures/ sepsis markers: 2/25 Blood cultures>>> 2/26 Urine cultures>>>neg 2/26 Blood cultures>>> 2/27 pct >>> 0.10 2/27 lactate >>> 1.0  Antibiotics: 2/26 Aztreonam>>> 2/25 vancomycin>>>2/27 2/26 Clindamycin>>> 2/25 Avelox>>>2/25 2/26 Ciprofloxacin>>>2/26 2/27 Daptomycin >>>>  Tests / Events: 2/25 CXR: mild lower lobe opacity? Pulmonary vascular congestion. 2/25 AXR: no obstruction/free air 2/26 CT Spine: by report no fluid collection   SUBJECTIVE/ INTERVAL/ OVERNIGHT HX  Still having fevers.  Feels terrible when she is febrile, chills, rigors.    Vital Signs: Temp:  [98.7 F (37.1 C)-102 F (38.9 C)] 101.7 F (38.7 C) (02/28 0929) Pulse Rate:  [62-95] 81  (02/28 0929) Resp:  [18] 18  (02/28 0929) BP: (90-126)/(56-80) 112/70 mmHg (02/28 0929) SpO2:  [92 %-97 %] 97 % (02/28 0929) I/O last 3 completed shifts: In: 780 [P.O.:480; IV Piggyback:300] Out: -   Physical Examination: General:  Does not appear ill, walking around room . Neuro:  Alert and oriented. LLE slightly weak.  Psych: anxious but not overly so HEENT:  PERRL, pink conjunctivae, moist membranes Neck:  Supple Cardiovascular:  RRR no M/R/G Lungs:  No accessory  muscle use, speaking in complete sentences, CTA B Abdomen:  Soft, nontender, nondistended, bowel sounds present Musculoskeletal:  Moves all extremities, no pedal edema Skin:  Appears flushed in face and upper extremities  Labs and Imaging:    CBC Lab Results  Component Value Date   WBC 2.8* 03/12/2011   HGB 11.1* 03/12/2011   HCT 32.6* 03/12/2011   MCV 87.2 03/12/2011   PLT 121* 03/12/2011   BMET    Component Value Date/Time   NA 140 03/12/2011 0900   K 4.1 03/12/2011 0900   CL 105 03/12/2011 0900   CO2 28 03/12/2011 0900   GLUCOSE 114* 03/12/2011 0900   BUN 5* 03/12/2011 0900   CREATININE 0.39* 03/12/2011 0900   CREATININE 0.56 02/03/2011 1517   CALCIUM 8.4 03/12/2011 0900   GFRNONAA >90 03/12/2011 0900   GFRAA >90 03/12/2011 0900      ASSESSMENT AND PLAN  Fevers - post op lumbar decompression/ fusion with continued fevers, leukocytosis.  Pct, lactate neg, cultures neg to date.  ?? R/t lumbar fluid collection.  Also with hx autoimmune hepatitis. ID following.   Does not appear toxic.  PLAN -  abx per ID For aspiration of lumbar fluid collection today per IR   Hypertension - controlled on home rx.  Trop neg.  PLAN -  Cont current rx    History of fatty liver and autoimmune hepatitis--seen by Dr. Elnoria Howard as outpatient.  LFTs stable.  PLAN -  F/u LFTs  Disposition: Do not think need to admit patient to ICU at this time.  PCCM signing off please call back if needed.     Danford Bad, NP 03/13/2011  11:45 AM Pager: [phone]  *  Care during the described time interval was provided by me and/or other providers on the critical care team. I have reviewed this patient's available data, including medical history, events of note, physical examination and test results as part of my evaluation.  Patient examined, records reviewed, assessment and plan as above.  Orlean Bradford, [name] Pulmonary and Critical Care Medicine [facility] Cell: [phone] Pager: [phone]

## 2011-03-13 NOTE — Progress Notes (Signed)
Patient ID: Megan [name], female   DOB: [birth year], 46 y.o.   MRN: [id number] Subjective:  The patient is alert and pleasant. She does not appear particularly ill.  Objective: Vital signs in last 24 hours: Temp:  [98.7 F (37.1 C)-102 F (38.9 C)] 101.7 F (38.7 C) (02/28 0929) Pulse Rate:  [62-95] 81  (02/28 0929) Resp:  [18] 18  (02/28 0929) BP: (90-126)/(56-80) 112/70 mmHg (02/28 0929) SpO2:  [92 %-97 %] 97 % (02/28 0929)  Intake/Output from previous day: 02/27 0701 - 02/28 0700 In: 780 [P.O.:480; IV Piggyback:300] Out: -  Intake/Output this shift: Total I/O In: 565 [P.O.:565] Out: -   Physical exam patient is alert and oriented. She is moving her lower extremity well. Her wound continues to heal without signs of infection.  Lab Results:  Basename 03/12/11 0900 03/11/11 2000  WBC 2.8* 2.8*  HGB 11.1* 11.0*  HCT 32.6* 32.1*  PLT 121* 114*   BMET  Basename 03/12/11 0900 03/11/11 2000  NA 140 136  K 4.1 3.0*  CL 105 99  CO2 28 30  GLUCOSE 114* 106*  BUN 5* 7  CREATININE 0.39* 0.48*  CALCIUM 8.4 8.0*    Studies/Results: Ct Lumbar Spine W Contrast  03/12/2011  *RADIOLOGY REPORT*  Clinical Data: Fever.  Increased weakness.  Lumbar fusion 1 week ago.  CT LUMBAR SPINE WITH CONTRAST  Technique:  Multidetector CT imaging of the lumbar spine was performed with intravenous contrast administration. Multiplanar CT image reconstructions were also generated.  Contrast:  100 ml Omnipaque-300  Comparison: 10/12/2009.  Findings: The numbering convention used on prior exam is preserved. Postoperative changes are present at L3-L4 compatible with recent discectomy and fusion.  L4-L5 fusion was present on the prior exam. There is bridging bone across the disc space at L4-L5.  There is a posterior subcutaneous fluid collection that measures 48 mm transverse, 24 mm AP and pulse 13 cm cranial-caudal.  This probably represents seroma in the setting of recent operation. Hematoma and abscess  are considered less likely.  The paraspinal soft tissues are within normal limits. T10-T11 through L2-L3 appears similar to prior examination.  L2-L3 shows a shallow circumferential disc bulging.  L3-L4:  Discectomy with bilateral laminotomies.  Small locule of gas is present in the right laminotomy bed (image 80 series 5). Central canal is poorly evaluated due to artifact from interbody bone graft markers.  The L3 pedicle screws appear appropriately located.  There is no hardware failure.  Morcellized posterolateral bone graft is present.  L4-L5:  Discectomy with solid posterolateral fusion.  Confluent bone graft is present.  Hardware appears intact and in good position.  No interval change compared to prior.  L5-S1:  Minimal disc bulging.  No stenosis.  Mild bilateral facet arthrosis.  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  IMPRESSION: 1.  Postoperative changes of recent L3-L4 bilateral laminotomies and discectomy.  No complicating features identified allowing for artifact which degrades evaluation of the operative level. 2.  Solid fusion at L4-L5.  Fusion hardware extends from L3-L5. There is no evidence of hardware loosening or failure. 3.  Large posterior subcutaneous fluid collection probably representing postoperative seroma.  Hematoma and abscess are in the differential considerations.  Original Report Authenticated By: Andreas Newport, [name]   Ct Abdomen Pelvis W Contrast  03/12/2011  *RADIOLOGY REPORT*  Clinical Data:  Postoperative fever.  CT ANGIOGRAPHY CHEST CT ABDOMEN AND PELVIS WITH CONTRAST  Technique:  Multidetector CT imaging of the chest was  performed using the standard protocol during bolus administration of intravenous contrast.  Multiplanar CT image reconstructions including MIPs were obtained to evaluate the vascular anatomy. Multidetector CT imaging of the abdomen and pelvis was performed using the standard protocol during bolus administration of  intravenous contrast.  Contrast: 80mL OMNIPAQUE IOHEXOL 350 MG/ML IV SOLN  Comparison:  CT abdomen 11/11/2007.  CTA CHEST  Findings:  The chest wall is unremarkable.  No breast masses, supraclavicular or axillary lymphadenopathy.  There are scattered axillary lymph nodes.  The bony thorax is intact.  No destructive bony lesions or spinal canal compromise.  Mild to moderate degenerative changes in the thoracic spine.  The heart is normal in size.  No pericardial effusion.  There are small scattered mediastinal and hilar lymph nodes but no adenopathy or mass.  The esophagus is grossly normal.  The aorta is normal in caliber.  Suboptimal opacification of the pulmonary arteries.  No large central pulmonary emboli.  Examination of the lung parenchyma demonstrates breathing motion artifact.  No infiltrates, edema or effusions.  No pulmonary mass lesions or nodules.  The tracheobronchial tree is grossly normal.   Review of the MIP images confirms the above findings.  IMPRESSION:  1.  Suboptimal opacification of the pulmonary arteries but no definite pulmonary emboli. 2.  Normal caliber thoracic aorta. 3.  No acute pulmonary findings.  CT ABDOMEN AND PELVIS  Findings: The contour or of the liver is abnormal.  Findings suggest changes of cirrhosis.  No focal hepatic lesion.  Mild central intrahepatic ductal dilatation likely due to prior cholecystectomy.  The portal and splenic veins are patent.  The splenic vein is prominent.  Splenomegaly is demonstrated.  The spleen measures 18 x 13 x 12 cm.  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  No focal splenic lesions. The pancreas is grossly normal.  The adrenal glands and kidneys are unremarkable.  The stomach, duodenum, small bowel and colon are grossly normal. There is moderate stool throughout the colon suggesting constipation.  No mesenteric or retroperitoneal mass or adenopathy. There is a metallic density in the transverse colon near the  splenic flexure which this likely ingested material.  There are small scattered lymph nodes.  The aorta is normal in caliber.  The major branch vessels are patent.  A retroaortic left renal vein is noted.  The uterus and ovaries are unremarkable.  The bladder is normal. No pelvic mass or adenopathy and no significant free pelvic fluid collections.  The small amount of fluid is noted in the small bowel mesentery.  No findings for abdominal/pelvic abscess or free air.  The bony structures are unremarkable.  There are surgical changes from recent posterior and interbody fusions at L3-4 and L4-5.  No complicating features are demonstrated.  IMPRESSION:  1.  Cirrhotic changes involving the liver.  No focal hepatic mass. Mild biliary dilatation due to prior cholecystectomy. 2.  Portal venous collaterals and splenomegaly. 3.  No findings for abdominal or pelvic abscess. 4.  Metallic foreign body in the colon has moved from the right colon into the transverse colon.  It could be a swallowed dental filling. 5.  Postoperative changes involving the lumbar spine without complicating features. 6.  Moderate stool throughout the colon suggesting constipation.  Original Report Authenticated By: P. Loralie Champagne, [name]   Dg Chest Port 1 View  03/11/2011  *RADIOLOGY REPORT*  Clinical Data: Shortness of breath, fever and back pain.  PORTABLE CHEST - 1 VIEW  Comparison: Chest  radiograph performed 03/09/2011  Findings: The lungs are well-aerated.  There is suggestion of mild medial right basilar airspace opacity, which could reflect mild pneumonia.  There is no evidence of pleural effusion or pneumothorax.  The cardiomediastinal silhouette is within normal limits.  No acute osseous abnormalities are seen.  IMPRESSION: Mild medial right basilar airspace opacity raises concern for mild pneumonia.  Original Report Authenticated By: Tonia Ghent, [name]    Assessment/Plan: Postop day #8: Patient continues to have fevers without any  obvious source. She does have a subcutaneous fluid collection at her lumbar surgery site. This likely is a benign subcutaneous fluid collection, however it is possible she has infection there. I will discuss with Dr. Ninetta Lights possibly sending her for a needle aspiration of this fluid. I am reluctant to open her wound as we may find only benign postoperative fluid. Given all the antibiotics that she is on, it may be difficult to interpret any fluid we may drain.  LOS: 8 days     Lei Dower D 03/13/2011, 11:21 AM

## 2011-03-13 NOTE — Progress Notes (Addendum)
INFECTIOUS DISEASE PROGRESS NOTE  ID: Megan [name] is a 46 y.o. female with  Active Problems:  * No active hospital problems. *   Subjective: Cont to have fever. C/o back pain.  Abtx:  Anti-infectives     Start     Dose/Rate Route Frequency Ordered Stop   03/13/11 0200   vancomycin (VANCOCIN) IVPB 1000 mg/200 mL premix        1,000 mg 200 mL/hr over 60 Minutes Intravenous Every 8 hours 03/12/11 1911     03/12/11 2000   ciprofloxacin (CIPRO) IVPB 400 mg  Status:  Discontinued        400 mg 200 mL/hr over 60 Minutes Intravenous Every 12 hours 03/11/11 1836 03/11/11 1837   03/11/11 2200   clindamycin (CLEOCIN) IVPB 300 mg        300 mg 100 mL/hr over 30 Minutes Intravenous 3 times per day 03/11/11 1855     03/11/11 2200   imipenem-cilastatin (PRIMAXIN) 500 mg in sodium chloride 0.9 % 100 mL IVPB  Status:  Discontinued        500 mg 200 mL/hr over 30 Minutes Intravenous 3 times per day 03/11/11 1855 03/11/11 1858   03/11/11 2000   ciprofloxacin (CIPRO) IVPB 400 mg  Status:  Discontinued        400 mg 200 mL/hr over 60 Minutes Intravenous Every 12 hours 03/11/11 1837 03/11/11 1855   03/11/11 2000   aztreonam (AZACTAM) injection 2 g  Status:  Discontinued        2 g Intramuscular Every 8 hours 03/11/11 1858 03/11/11 1909   03/11/11 2000   aztreonam (AZACTAM) 2 g in dextrose 5 % 50 mL IVPB        2 g 100 mL/hr over 30 Minutes Intravenous Every 8 hours 03/11/11 1910     03/11/11 0500   vancomycin (VANCOCIN) IVPB 1000 mg/200 mL premix  Status:  Discontinued        1,000 mg 200 mL/hr over 60 Minutes Intravenous Every 12 hours 03/10/11 1621 03/12/11 1911   03/10/11 1700   ciprofloxacin (CIPRO) IVPB 400 mg  Status:  Discontinued        400 mg 200 mL/hr over 60 Minutes Intravenous Every 12 hours 03/10/11 1605 03/11/11 1836   03/10/11 1630   vancomycin (VANCOCIN) 2,500 mg in sodium chloride 0.9 % 500 mL IVPB        2,500 mg 250 mL/hr over 120 Minutes Intravenous  Once 03/10/11  1621 03/10/11 2114   03/10/11 1200   moxifloxacin (AVELOX) IVPB 400 mg  Status:  Discontinued        400 mg 250 mL/hr over 60 Minutes Intravenous Every 24 hours 03/10/11 1117 03/10/11 1605   03/09/11 1200   fluconazole (DIFLUCAN) tablet 150 mg        150 mg Oral  Once 03/09/11 0918 03/09/11 1129   03/05/11 2200   vancomycin (VANCOCIN) 1,750 mg in sodium chloride 0.9 % 500 mL IVPB        1,750 mg 250 mL/hr over 120 Minutes Intravenous  Once 03/05/11 1653 03/06/11 0032   03/05/11 0945   vancomycin (VANCOCIN) 500 mg in sodium chloride 0.9 % 100 mL IVPB  Status:  Discontinued        500 mg 100 mL/hr over 60 Minutes Intravenous To Surgery 03/05/11 0939 03/05/11 1524   03/05/11 0935   bacitracin 50,000 Units in sodium chloride irrigation 0.9 % 500 mL irrigation  Status:  Discontinued  As needed 03/05/11 0935 03/05/11 1255   03/05/11 0834   vancomycin (VANCOCIN) 1 GM/200ML IVPB     Comments: Sharlene Dory [facility]): cabinet override         03/05/11 0834 03/05/11 0950   03/05/11 0828   bacitracin 16109 UNITS injection     Comments: Worthy Flank: cabinet override         03/05/11 0828 03/05/11 2029   03/04/11 1547   vancomycin (VANCOCIN) 1,500 mg in sodium chloride 0.9 % 500 mL IVPB  Status:  Discontinued        1,500 mg 250 mL/hr over 120 Minutes Intravenous 120 min pre-op 03/04/11 1547 03/05/11 0938          Medications:  Scheduled:   . aztreonam  2 g Intravenous Q8H  . clindamycin (CLEOCIN) IV  300 mg Intravenous Q8H  . docusate sodium  100 mg Oral BID  . gabapentin  400 mg Oral TID  . levothyroxine  175 mcg Oral Q0600  . lisinopril  10 mg Oral Daily  . metaxalone  800 mg Oral TID  . metoprolol succinate  50 mg Oral Daily  . vancomycin  1,000 mg Intravenous Q8H  . DISCONTD: vancomycin  1,000 mg Intravenous Q12H    Objective: Vital signs in last 24 hours: Temp:  [98.7 F (37.1 C)-102 F (38.9 C)] 101.7 F (38.7 C) (02/28 0929) Pulse Rate:  [62-95] 81  (02/28  0929) Resp:  [18] 18  (02/28 0929) BP: (90-126)/(56-80) 112/70 mmHg (02/28 0929) SpO2:  [92 %-97 %] 97 % (02/28 0929)   General appearance: alert, cooperative and mild distress Back: wound tender, no erythema or wound breakdown.  Resp: clear to auscultation bilaterally Cardio: regular rate and rhythm GI: normal findings: bowel sounds normal and soft, non-tender  Lab Results  Basename 03/12/11 0900 03/11/11 2000  WBC 2.8* 2.8*  HGB 11.1* 11.0*  HCT 32.6* 32.1*  NA 140 136  K 4.1 3.0*  CL 105 99  CO2 28 30  BUN 5* 7  CREATININE 0.39* 0.48*  GLU -- --   Liver Panel  Basename 03/12/11 0900 03/11/11 2000  PROT 6.0 5.9*  ALBUMIN 2.3* 2.5*  AST 16 15  ALT 9 9  ALKPHOS 71 70  BILITOT 0.4 0.5  BILIDIR -- --  IBILI -- --   Sedimentation Rate No results found for this basename: ESRSEDRATE in the last 72 hours C-Reactive Protein No results found for this basename: CRP:2 in the last 72 hours  Microbiology: Recent Results (from the past 240 hour(s))  CULTURE, BLOOD (ROUTINE X 2)     Status: Normal (Preliminary result)   Collection Time   03/10/11  9:22 AM      Component Value Range Status Comment   Specimen Description BLOOD LEFT ARM   Final    Special Requests BOTTLES DRAWN AEROBIC AND ANAEROBIC 10CC   Final    Culture  Setup Time [id number]   Final    Culture     Final    Value:        BLOOD CULTURE RECEIVED NO GROWTH TO DATE CULTURE WILL BE HELD FOR 5 DAYS BEFORE ISSUING A FINAL NEGATIVE REPORT   Report Status PENDING   Incomplete   CULTURE, BLOOD (ROUTINE X 2)     Status: Normal (Preliminary result)   Collection Time   03/10/11  9:28 AM      Component Value Range Status Comment   Specimen Description BLOOD RIGHT ARM   Final  Special Requests BOTTLES DRAWN AEROBIC AND ANAEROBIC 10CC   Final    Culture  Setup Time [id number]   Final    Culture     Final    Value:        BLOOD CULTURE RECEIVED NO GROWTH TO DATE CULTURE WILL BE HELD FOR 5 DAYS BEFORE ISSUING A  FINAL NEGATIVE REPORT   Report Status PENDING   Incomplete   CULTURE, BLOOD (ROUTINE X 2)     Status: Normal (Preliminary result)   Collection Time   03/11/11  7:42 PM      Component Value Range Status Comment   Specimen Description BLOOD LEFT ARM   Final    Special Requests BOTTLES DRAWN AEROBIC AND ANAEROBIC 10CC   Final    Culture  Setup Time [id number]   Final    Culture     Final    Value:        BLOOD CULTURE RECEIVED NO GROWTH TO DATE CULTURE WILL BE HELD FOR 5 DAYS BEFORE ISSUING A FINAL NEGATIVE REPORT   Report Status PENDING   Incomplete   CULTURE, BLOOD (ROUTINE X 2)     Status: Normal (Preliminary result)   Collection Time   03/11/11  7:51 PM      Component Value Range Status Comment   Specimen Description BLOOD LEFT HAND   Final    Special Requests BOTTLES DRAWN AEROBIC AND ANAEROBIC 10CC   Final    Culture  Setup Time [id number]   Final    Culture     Final    Value:        BLOOD CULTURE RECEIVED NO GROWTH TO DATE CULTURE WILL BE HELD FOR 5 DAYS BEFORE ISSUING A FINAL NEGATIVE REPORT   Report Status PENDING   Incomplete     Studies/Results: Ct Lumbar Spine W Contrast  03/12/2011  *RADIOLOGY REPORT*  Clinical Data: Fever.  Increased weakness.  Lumbar fusion 1 week ago.  CT LUMBAR SPINE WITH CONTRAST  Technique:  Multidetector CT imaging of the lumbar spine was performed with intravenous contrast administration. Multiplanar CT image reconstructions were also generated.  Contrast:  100 ml Omnipaque-300  Comparison: 10/12/2009.  Findings: The numbering convention used on prior exam is preserved. Postoperative changes are present at L3-L4 compatible with recent discectomy and fusion.  L4-L5 fusion was present on the prior exam. There is bridging bone across the disc space at L4-L5.  There is a posterior subcutaneous fluid collection that measures 48 mm transverse, 24 mm AP and pulse 13 cm cranial-caudal.  This probably represents seroma in the setting of recent operation.  Hematoma and abscess are considered less likely.  The paraspinal soft tissues are within normal limits. T10-T11 through L2-L3 appears similar to prior examination.  L2-L3 shows a shallow circumferential disc bulging.  L3-L4:  Discectomy with bilateral laminotomies.  Small locule of gas is present in the right laminotomy bed (image 80 series 5). Central canal is poorly evaluated due to artifact from interbody bone graft markers.  The L3 pedicle screws appear appropriately located.  There is no hardware failure.  Morcellized posterolateral bone graft is present.  L4-L5:  Discectomy with solid posterolateral fusion.  Confluent bone graft is present.  Hardware appears intact and in good position.  No interval change compared to prior.  L5-S1:  Minimal disc bulging.  No stenosis.  Mild bilateral facet arthrosis.  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  IMPRESSION: 1.  Postoperative changes of recent L3-L4  bilateral laminotomies and discectomy.  No complicating features identified allowing for artifact which degrades evaluation of the operative level. 2.  Solid fusion at L4-L5.  Fusion hardware extends from L3-L5. There is no evidence of hardware loosening or failure. 3.  Large posterior subcutaneous fluid collection probably representing postoperative seroma.  Hematoma and abscess are in the differential considerations.  Original Report Authenticated By: Andreas Newport, [name]   Ct Abdomen Pelvis W Contrast  03/12/2011  *RADIOLOGY REPORT*  Clinical Data:  Postoperative fever.  CT ANGIOGRAPHY CHEST CT ABDOMEN AND PELVIS WITH CONTRAST  Technique:  Multidetector CT imaging of the chest was performed using the standard protocol during bolus administration of intravenous contrast.  Multiplanar CT image reconstructions including MIPs were obtained to evaluate the vascular anatomy. Multidetector CT imaging of the abdomen and pelvis was performed using the standard protocol during bolus  administration of intravenous contrast.  Contrast: 80mL OMNIPAQUE IOHEXOL 350 MG/ML IV SOLN  Comparison:  CT abdomen 11/11/2007.  CTA CHEST  Findings:  The chest wall is unremarkable.  No breast masses, supraclavicular or axillary lymphadenopathy.  There are scattered axillary lymph nodes.  The bony thorax is intact.  No destructive bony lesions or spinal canal compromise.  Mild to moderate degenerative changes in the thoracic spine.  The heart is normal in size.  No pericardial effusion.  There are small scattered mediastinal and hilar lymph nodes but no adenopathy or mass.  The esophagus is grossly normal.  The aorta is normal in caliber.  Suboptimal opacification of the pulmonary arteries.  No large central pulmonary emboli.  Examination of the lung parenchyma demonstrates breathing motion artifact.  No infiltrates, edema or effusions.  No pulmonary mass lesions or nodules.  The tracheobronchial tree is grossly normal.   Review of the MIP images confirms the above findings.  IMPRESSION:  1.  Suboptimal opacification of the pulmonary arteries but no definite pulmonary emboli. 2.  Normal caliber thoracic aorta. 3.  No acute pulmonary findings.  CT ABDOMEN AND PELVIS  Findings: The contour or of the liver is abnormal.  Findings suggest changes of cirrhosis.  No focal hepatic lesion.  Mild central intrahepatic ductal dilatation likely due to prior cholecystectomy.  The portal and splenic veins are patent.  The splenic vein is prominent.  Splenomegaly is demonstrated.  The spleen measures 18 x 13 x 12 cm.  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  No focal splenic lesions. The pancreas is grossly normal.  The adrenal glands and kidneys are unremarkable.  The stomach, duodenum, small bowel and colon are grossly normal. There is moderate stool throughout the colon suggesting constipation.  No mesenteric or retroperitoneal mass or adenopathy. There is a metallic density in the transverse  colon near the splenic flexure which this likely ingested material.  There are small scattered lymph nodes.  The aorta is normal in caliber.  The major branch vessels are patent.  A retroaortic left renal vein is noted.  The uterus and ovaries are unremarkable.  The bladder is normal. No pelvic mass or adenopathy and no significant free pelvic fluid collections.  The small amount of fluid is noted in the small bowel mesentery.  No findings for abdominal/pelvic abscess or free air.  The bony structures are unremarkable.  There are surgical changes from recent posterior and interbody fusions at L3-4 and L4-5.  No complicating features are demonstrated.  IMPRESSION:  1.  Cirrhotic changes involving the liver.  No focal hepatic mass. Mild biliary dilatation  due to prior cholecystectomy. 2.  Portal venous collaterals and splenomegaly. 3.  No findings for abdominal or pelvic abscess. 4.  Metallic foreign body in the colon has moved from the right colon into the transverse colon.  It could be a swallowed dental filling. 5.  Postoperative changes involving the lumbar spine without complicating features. 6.  Moderate stool throughout the colon suggesting constipation.  Original Report Authenticated By: P. Loralie Champagne, [name]   Dg Chest Port 1 View  03/11/2011  *RADIOLOGY REPORT*  Clinical Data: Shortness of breath, fever and back pain.  PORTABLE CHEST - 1 VIEW  Comparison: Chest radiograph performed 03/09/2011  Findings: The lungs are well-aerated.  There is suggestion of mild medial right basilar airspace opacity, which could reflect mild pneumonia.  There is no evidence of pleural effusion or pneumothorax.  The cardiomediastinal silhouette is within normal limits.  No acute osseous abnormalities are seen.  IMPRESSION: Mild medial right basilar airspace opacity raises concern for mild pneumonia.  Original Report Authenticated By: Tonia Ghent, [name]     Assessment/Plan: Postoperative Fever, decreasing WBC,  worsening mental status  diet controlled DM  hypothyroid  L4-5 decompression//fusion Jan 2006 L3-5 decompression/fusion 03-05-11. By 03-07-11 she developed temp to 100.9.  PEN allergy- throat closes up, breaks up in hives  Anbx day 3  Vanco/ aztreonam/clinda Will change her anbx to cubicin with her decreasing WBC. Could this all be due to vanco? True vanco allergies are very rare.  May need to consider aspiration of her fluid collection under her wound.    Johny Sax Infectious Diseases [phone] 03/13/2011, 10:34 AM

## 2011-03-13 NOTE — Progress Notes (Signed)
Physical Therapy Treatment Patient Details Name: Megan [name] MRN: [id number] DOB: 1965-05-23 Today's Date: 03/13/2011  PT Assessment/Plan  PT - Assessment/Plan Comments on Treatment Session: Pt progressing well. She was able to ambulate the circle today with minimal complaints as well as complete all transfers with supervision only. Pt going down for spinal tap tomorrow. Plan to d/c when medically stable. Pt close to baseline functional level. PT Plan: Discharge plan remains appropriate;Frequency remains appropriate PT Frequency: Min 5X/week Follow Up Recommendations: Home health PT;Supervision - Intermittent Equipment Recommended: Rolling walker with 5" wheels PT Goals  Acute Rehab PT Goals PT Goal Formulation: With patient PT Goal: Rolling Supine to Right Side - Progress: Progressing toward goal PT Goal: Rolling Supine to Left Side - Progress: Progressing toward goal PT Goal: Supine/Side to Sit - Progress: Progressing toward goal PT Goal: Sit to Supine/Side - Progress: Progressing toward goal PT Goal: Sit to Stand - Progress: Progressing toward goal PT Goal: Ambulate - Progress: Progressing toward goal  PT Treatment Precautions/Restrictions  Precautions Precautions: Back Precaution Booklet Issued: No Precaution Comments: Pt able to demonstrate proper body mechanics and maintain back precautions functioanlly Required Braces or Orthoses: Yes Spinal Brace: Lumbar corset;Applied in sitting position Restrictions Weight Bearing Restrictions: No Mobility (including Balance) Bed Mobility Bed Mobility: Yes Rolling Right: 6: Modified independent (Device/Increase time) Right Sidelying to Sit: 6: Modified independent (Device/Increase time) Sitting - Scoot to Edge of Bed: 6: Modified independent (Device/Increase time) Transfers Transfers: Yes Sit to Stand: 5: Supervision;With upper extremity assist;From bed;From toilet Sit to Stand Details (indicate cue type and reason): VC for hand  placement for safety to RW Stand to Sit: 5: Supervision;With upper extremity assist;To bed Stand to Sit Details: VC for hand placement for safety upon sitting. Pt able to control descent Ambulation/Gait Ambulation/Gait: Yes Ambulation/Gait Assistance: Other (comment) (Minguard assist) Ambulation/Gait Assistance Details (indicate cue type and reason): VC throughout for proper posture and safety with RW. Pt still with feelings of weakness in LLE inhibiting ambulation without RW Ambulation Distance (Feet): 150 Feet Assistive device: Rolling walker Gait Pattern: Decreased stride length;Step-through pattern;Trunk flexed Gait velocity: Decreased gait speed Stairs: No    Exercise    End of Session PT - End of Session Equipment Utilized During Treatment: Gait belt;Back brace Activity Tolerance: Patient tolerated treatment well Patient left: in bed;with call bell in reach;with family/visitor present Nurse Communication: Mobility status for transfers;Mobility status for ambulation General Behavior During Session: [facility] for tasks performed Cognition: [facility] for tasks performed  Milana Kidney 03/13/2011, 4:32 PM  03/13/2011 Milana Kidney DPT PAGER: [phone] OFFICE: [phone]

## 2011-03-14 ENCOUNTER — Inpatient Hospital Stay (HOSPITAL_COMMUNITY): Payer: PRIVATE HEALTH INSURANCE

## 2011-03-14 DIAGNOSIS — Y831 Surgical operation with implant of artificial internal device as the cause of abnormal reaction of the patient, or of later complication, without mention of misadventure at the time of the procedure: Secondary | ICD-10-CM

## 2011-03-14 DIAGNOSIS — R5082 Postprocedural fever: Secondary | ICD-10-CM

## 2011-03-14 MED ORDER — FLEET ENEMA 7-19 GM/118ML RE ENEM
1.0000 | ENEMA | Freq: Every day | RECTAL | Status: DC | PRN
  Administered 2011-03-14: 1 via RECTAL
  Filled 2011-03-14: qty 1

## 2011-03-14 NOTE — Progress Notes (Signed)
Patient ID: Megan [name], female   DOB: 03/20/1965, 46 y.o.   MRN: [id number] Request received for aspiration of lumbar subcutaneous fluid collection noted on recent CT scan. Patient's history reviewed, imaging studies reviewed by Dr. Deanne Coffer, labs checked.Details / risks of the above procedure were discussed with the patient including but not limited to internal bleeding and infection. Patient seems to agree with the above and consent was obtained. Procedure will be performed via ultrasound guidance.

## 2011-03-14 NOTE — Progress Notes (Signed)
Occupational Therapy Treatment Patient Details Name: Megan [name] MRN: [id number] DOB: 1965-08-18 Today's Date: 03/14/2011  OT Assessment/Plan OT Assessment/Plan Comments on Treatment Session: Pt has made great progress towards OT goals.  Pt hopes to be medically stable soon so that she can return home.  Will benefit from at least one more session to ensure safety with functional mobility. OT Plan: Discharge plan remains appropriate OT Frequency: Min 2X/week Follow Up Recommendations: Home health OT Equipment Recommended: 3 in 1 bedside comode OT Goals ADL Goals Pt Will Perform Lower Body Dressing: with set-up;Sitting, chair;Sitting, bed;Sit to stand from chair;Sit to stand from bed;Unsupported;with adaptive equipment ADL Goal: Lower Body Dressing - Progress: Met Pt Will Transfer to Toilet: with set-up;3-in-1 ADL Goal: Toilet Transfer - Progress: Progressing toward goals Pt Will Perform Toileting - Hygiene: with set-up;Sit to stand from 3-in-1/toilet ADL Goal: Toileting - Hygiene - Progress: Met Miscellaneous OT Goals Miscellaneous OT Goal #1: Pt will don / doff brace MOD I as precursor to ADLs and all mobility for basic transfers OT Goal: Miscellaneous Goal #1 - Progress: Met  OT Treatment Precautions/Restrictions  Precautions Precautions: Back Precaution Comments: Pt able to demonstrate proper body mechanics and maintain back precautions functioanlly Required Braces or Orthoses: Yes Spinal Brace: Lumbar corset;Applied in sitting position Restrictions Weight Bearing Restrictions: No   ADL ADL Lower Body Bathing: Simulated;Modified independent Lower Body Bathing Details (indicate cue type and reason): Pt reports bathing herself that morning.  Pt simluated how she performed LB bathing and demonstrates good technique while maintaining back precautions. Where Assessed - Lower Body Bathing: Sit to stand from chair Lower Body Dressing: Simulated;Modified independent Lower Body  Dressing Details (indicate cue type and reason): Pt demonstrates safe technique while maintaining back precautions.  Pt fully dressed upon OT arrival and demosntrated how she performed her LB dressing that morning. Where Assessed - Lower Body Dressing: Sit to stand from chair Toilet Transfer: Performed;Supervision/safety Toilet Transfer Details (indicate cue type and reason): supervision for safety Toilet Transfer Method: Ambulating Toilet Transfer Equipment: Regular height toilet Toileting - Clothing Manipulation: Performed;Modified independent Where Assessed - Toileting Clothing Manipulation: Standing Toileting - Hygiene: Performed;Modified independent Equipment Used: Rolling walker Mobility  Bed Mobility Bed Mobility: No Transfers Transfers: Yes Sit to Stand: 5: Supervision;With upper extremity assist;From bed;From toilet Stand to Sit: 5: Supervision;To toilet;To chair/3-in-1;To bed;With armrests;With upper extremity assist Exercises    End of Session OT - End of Session Equipment Utilized During Treatment: Back brace Activity Tolerance: Patient tolerated treatment well Patient left: in chair;with call bell in reach;with family/visitor present General Behavior During Session: [facility] for tasks performed Cognition: [facility] for tasks performed   3:10 PM 03/14/2011 Cipriano Mile OTR/L Pager [phone] Office [phone]

## 2011-03-14 NOTE — Progress Notes (Signed)
Patient ID: Megan [name], female   DOB: 05-Apr-1965, 46 y.o.   MRN: [id number] Subjective:  The patient is alert and pleasant. She looks better today.  Objective: Vital signs in last 24 hours: Temp:  [98.2 F (36.8 C)-101.7 F (38.7 C)] 101 F (38.3 C) (03/01 0600) Pulse Rate:  [63-81] 77  (03/01 0600) Resp:  [18-19] 19  (03/01 0600) BP: (91-129)/(56-81) 110/66 mmHg (03/01 0600) SpO2:  [97 %-99 %] 97 % (03/01 0600)  Intake/Output from previous day: 02/28 0701 - 03/01 0700 In: 1285 [P.O.:1285] Out: -  Intake/Output this shift:    Physical exam the patient's incision is well-healed without signs of infection.  Lab Results:  Basename 03/12/11 0900 03/11/11 2000  WBC 2.8* 2.8*  HGB 11.1* 11.0*  HCT 32.6* 32.1*  PLT 121* 114*   BMET  Basename 03/12/11 0900 03/11/11 2000  NA 140 136  K 4.1 3.0*  CL 105 99  CO2 28 30  GLUCOSE 114* 106*  BUN 5* 7  CREATININE 0.39* 0.48*  CALCIUM 8.4 8.0*    Studies/Results: Ct Abdomen Pelvis W Contrast  03/12/2011  *RADIOLOGY REPORT*  Clinical Data:  Postoperative fever.  CT ANGIOGRAPHY CHEST CT ABDOMEN AND PELVIS WITH CONTRAST  Technique:  Multidetector CT imaging of the chest was performed using the standard protocol during bolus administration of intravenous contrast.  Multiplanar CT image reconstructions including MIPs were obtained to evaluate the vascular anatomy. Multidetector CT imaging of the abdomen and pelvis was performed using the standard protocol during bolus administration of intravenous contrast.  Contrast: 80mL OMNIPAQUE IOHEXOL 350 MG/ML IV SOLN  Comparison:  CT abdomen 11/11/2007.  CTA CHEST  Findings:  The chest wall is unremarkable.  No breast masses, supraclavicular or axillary lymphadenopathy.  There are scattered axillary lymph nodes.  The bony thorax is intact.  No destructive bony lesions or spinal canal compromise.  Mild to moderate degenerative changes in the thoracic spine.  The heart is normal in size.  No  pericardial effusion.  There are small scattered mediastinal and hilar lymph nodes but no adenopathy or mass.  The esophagus is grossly normal.  The aorta is normal in caliber.  Suboptimal opacification of the pulmonary arteries.  No large central pulmonary emboli.  Examination of the lung parenchyma demonstrates breathing motion artifact.  No infiltrates, edema or effusions.  No pulmonary mass lesions or nodules.  The tracheobronchial tree is grossly normal.   Review of the MIP images confirms the above findings.  IMPRESSION:  1.  Suboptimal opacification of the pulmonary arteries but no definite pulmonary emboli. 2.  Normal caliber thoracic aorta. 3.  No acute pulmonary findings.  CT ABDOMEN AND PELVIS  Findings: The contour or of the liver is abnormal.  Findings suggest changes of cirrhosis.  No focal hepatic lesion.  Mild central intrahepatic ductal dilatation likely due to prior cholecystectomy.  The portal and splenic veins are patent.  The splenic vein is prominent.  Splenomegaly is demonstrated.  The spleen measures 18 x 13 x 12 cm.  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  No focal splenic lesions. The pancreas is grossly normal.  The adrenal glands and kidneys are unremarkable.  The stomach, duodenum, small bowel and colon are grossly normal. There is moderate stool throughout the colon suggesting constipation.  No mesenteric or retroperitoneal mass or adenopathy. There is a metallic density in the transverse colon near the splenic flexure which this likely ingested material.  There are small scattered lymph nodes.  The aorta is normal in caliber.  The major branch vessels are patent.  A retroaortic left renal vein is noted.  The uterus and ovaries are unremarkable.  The bladder is normal. No pelvic mass or adenopathy and no significant free pelvic fluid collections.  The small amount of fluid is noted in the small bowel mesentery.  No findings for abdominal/pelvic abscess or  free air.  The bony structures are unremarkable.  There are surgical changes from recent posterior and interbody fusions at L3-4 and L4-5.  No complicating features are demonstrated.  IMPRESSION:  1.  Cirrhotic changes involving the liver.  No focal hepatic mass. Mild biliary dilatation due to prior cholecystectomy. 2.  Portal venous collaterals and splenomegaly. 3.  No findings for abdominal or pelvic abscess. 4.  Metallic foreign body in the colon has moved from the right colon into the transverse colon.  It could be a swallowed dental filling. 5.  Postoperative changes involving the lumbar spine without complicating features. 6.  Moderate stool throughout the colon suggesting constipation.  Original Report Authenticated By: P. Loralie Champagne, [name]    Assessment/Plan: Postop day #9: The patient is still having fevers. She still has a subcutaneous fluid collection. At this point am not entirely convinced of this infection. I think the best course of action is to have interventional radiology aspirate this fluid. If this does come back consistent with an infection of perform incision and drainage. 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  LOS: 9 days     Coden Franchi D 03/14/2011, 7:39 AM

## 2011-03-14 NOTE — Progress Notes (Signed)
Utilization review completed. Mahayla Haddaway, RN, BSN. 03/14/11 

## 2011-03-14 NOTE — Progress Notes (Signed)
PT Cancellation Note  Treatment cancelled today due to patient's refusal to participate. Pt anxious about procedure today and not wanting to get OOB at this time. Pt educated on the importance of mobility in the acute setting, but still unwilling to participate.  Will reattempt later today as able.    [address] [place], [place] [phone]  03/14/2011, 1:39 PM

## 2011-03-14 NOTE — Progress Notes (Signed)
INFECTIOUS DISEASE PROGRESS NOTE  ID: Megan [name] is a 46 y.o. female with  Active Problems:  * No active hospital problems. *   Subjective: No complaitn of back pain at this time. Continues to be febrile  Abtx:  Anti-infectives     Start     Dose/Rate Route Frequency Ordered Stop   03/13/11 1200   DAPTOmycin (CUBICIN) 702 mg in sodium chloride 0.9 % IVPB        6 mg/kg  117 kg 228.1 mL/hr over 30 Minutes Intravenous Every 24 hours 03/13/11 1103     03/13/11 0200   vancomycin (VANCOCIN) IVPB 1000 mg/200 mL premix  Status:  Discontinued        1,000 mg 200 mL/hr over 60 Minutes Intravenous Every 8 hours 03/12/11 1911 03/13/11 1046   03/12/11 2000   ciprofloxacin (CIPRO) IVPB 400 mg  Status:  Discontinued        400 mg 200 mL/hr over 60 Minutes Intravenous Every 12 hours 03/11/11 1836 03/11/11 1837   03/11/11 2200   clindamycin (CLEOCIN) IVPB 300 mg        300 mg 100 mL/hr over 30 Minutes Intravenous 3 times per day 03/11/11 1855     03/11/11 2200   imipenem-cilastatin (PRIMAXIN) 500 mg in sodium chloride 0.9 % 100 mL IVPB  Status:  Discontinued        500 mg 200 mL/hr over 30 Minutes Intravenous 3 times per day 03/11/11 1855 03/11/11 1858   03/11/11 2000   ciprofloxacin (CIPRO) IVPB 400 mg  Status:  Discontinued        400 mg 200 mL/hr over 60 Minutes Intravenous Every 12 hours 03/11/11 1837 03/11/11 1855   03/11/11 2000   aztreonam (AZACTAM) injection 2 g  Status:  Discontinued        2 g Intramuscular Every 8 hours 03/11/11 1858 03/11/11 1909   03/11/11 2000   aztreonam (AZACTAM) 2 g in dextrose 5 % 50 mL IVPB        2 g 100 mL/hr over 30 Minutes Intravenous Every 8 hours 03/11/11 1910     03/11/11 0500   vancomycin (VANCOCIN) IVPB 1000 mg/200 mL premix  Status:  Discontinued        1,000 mg 200 mL/hr over 60 Minutes Intravenous Every 12 hours 03/10/11 1621 03/12/11 1911   03/10/11 1700   ciprofloxacin (CIPRO) IVPB 400 mg  Status:  Discontinued        400 mg 200  mL/hr over 60 Minutes Intravenous Every 12 hours 03/10/11 1605 03/11/11 1836   03/10/11 1630   vancomycin (VANCOCIN) 2,500 mg in sodium chloride 0.9 % 500 mL IVPB        2,500 mg 250 mL/hr over 120 Minutes Intravenous  Once 03/10/11 1621 03/10/11 2114   03/10/11 1200   moxifloxacin (AVELOX) IVPB 400 mg  Status:  Discontinued        400 mg 250 mL/hr over 60 Minutes Intravenous Every 24 hours 03/10/11 1117 03/10/11 1605   03/09/11 1200   fluconazole (DIFLUCAN) tablet 150 mg        150 mg Oral  Once 03/09/11 0918 03/09/11 1129   03/05/11 2200   vancomycin (VANCOCIN) 1,750 mg in sodium chloride 0.9 % 500 mL IVPB        1,750 mg 250 mL/hr over 120 Minutes Intravenous  Once 03/05/11 1653 03/06/11 0032   03/05/11 0945   vancomycin (VANCOCIN) 500 mg in sodium chloride 0.9 % 100 mL IVPB  Status:  Discontinued        500 mg 100 mL/hr over 60 Minutes Intravenous To Surgery 03/05/11 0939 03/05/11 1524   03/05/11 0935   bacitracin 50,000 Units in sodium chloride irrigation 0.9 % 500 mL irrigation  Status:  Discontinued          As needed 03/05/11 0935 03/05/11 1255   03/05/11 0834   vancomycin (VANCOCIN) 1 GM/200ML IVPB     Comments: Sharlene Dory [facility]): cabinet override         03/05/11 0834 03/05/11 0950   03/05/11 0828   bacitracin 40981 UNITS injection     Comments: Worthy Flank: cabinet override         03/05/11 0828 03/05/11 2029   03/04/11 1547   vancomycin (VANCOCIN) 1,500 mg in sodium chloride 0.9 % 500 mL IVPB  Status:  Discontinued        1,500 mg 250 mL/hr over 120 Minutes Intravenous 120 min pre-op 03/04/11 1547 03/05/11 0938          Medications:  Scheduled:    . aztreonam  2 g Intravenous Q8H  . clindamycin (CLEOCIN) IV  300 mg Intravenous Q8H  . DAPTOmycin (CUBICIN)  IV  6 mg/kg Intravenous Q24H  . docusate sodium  100 mg Oral BID  . gabapentin  400 mg Oral TID  . levothyroxine  175 mcg Oral Q0600  . lisinopril  10 mg Oral Daily  . metaxalone  800 mg Oral TID    . metoprolol succinate  50 mg Oral Daily    Objective: Vital signs in last 24 hours: Temp:  [98.2 F (36.8 C)-101.3 F (38.5 C)] 98.5 F (36.9 C) (03/01 1000) Pulse Rate:  [60-77] 60  (03/01 1000) Resp:  [18-19] 18  (03/01 1000) BP: (91-110)/(56-69) 96/65 mmHg (03/01 1000) SpO2:  [97 %-99 %] 97 % (03/01 1000)   General appearance: alert, cooperative and mild distress Back: wound tender, no erythema or wound breakdown.  Resp: clear to auscultation bilaterally Cardio: regular rate and rhythm GI: normal findings: bowel sounds normal and soft, non-tender  Lab Results  Basename 03/12/11 0900 03/11/11 2000  WBC 2.8* 2.8*  HGB 11.1* 11.0*  HCT 32.6* 32.1*  NA 140 136  K 4.1 3.0*  CL 105 99  CO2 28 30  BUN 5* 7  CREATININE 0.39* 0.48*  GLU -- --   Liver Panel  Basename 03/12/11 0900 03/11/11 2000  PROT 6.0 5.9*  ALBUMIN 2.3* 2.5*  AST 16 15  ALT 9 9  ALKPHOS 71 70  BILITOT 0.4 0.5  BILIDIR -- --  IBILI -- --   Sedimentation Rate No results found for this basename: ESRSEDRATE in the last 72 hours C-Reactive Protein No results found for this basename: CRP:2 in the last 72 hours  Microbiology: Recent Results (from the past 240 hour(s))  CULTURE, BLOOD (ROUTINE X 2)     Status: Normal (Preliminary result)   Collection Time   03/10/11  9:22 AM      Component Value Range Status Comment   Specimen Description BLOOD LEFT ARM   Final    Special Requests BOTTLES DRAWN AEROBIC AND ANAEROBIC 10CC   Final    Culture  Setup Time [id number]   Final    Culture     Final    Value:        BLOOD CULTURE RECEIVED NO GROWTH TO DATE CULTURE WILL BE HELD FOR 5 DAYS BEFORE ISSUING A FINAL NEGATIVE REPORT   Report Status PENDING  Incomplete   CULTURE, BLOOD (ROUTINE X 2)     Status: Normal (Preliminary result)   Collection Time   03/10/11  9:28 AM      Component Value Range Status Comment   Specimen Description BLOOD RIGHT ARM   Final    Special Requests BOTTLES DRAWN AEROBIC  AND ANAEROBIC 10CC   Final    Culture  Setup Time [id number]   Final    Culture     Final    Value:        BLOOD CULTURE RECEIVED NO GROWTH TO DATE CULTURE WILL BE HELD FOR 5 DAYS BEFORE ISSUING A FINAL NEGATIVE REPORT   Report Status PENDING   Incomplete   CULTURE, BLOOD (ROUTINE X 2)     Status: Normal (Preliminary result)   Collection Time   03/11/11  7:42 PM      Component Value Range Status Comment   Specimen Description BLOOD LEFT ARM   Final    Special Requests BOTTLES DRAWN AEROBIC AND ANAEROBIC 10CC   Final    Culture  Setup Time [id number]   Final    Culture     Final    Value:        BLOOD CULTURE RECEIVED NO GROWTH TO DATE CULTURE WILL BE HELD FOR 5 DAYS BEFORE ISSUING A FINAL NEGATIVE REPORT   Report Status PENDING   Incomplete   CULTURE, BLOOD (ROUTINE X 2)     Status: Normal (Preliminary result)   Collection Time   03/11/11  7:51 PM      Component Value Range Status Comment   Specimen Description BLOOD LEFT HAND   Final    Special Requests BOTTLES DRAWN AEROBIC AND ANAEROBIC 10CC   Final    Culture  Setup Time [id number]   Final    Culture     Final    Value:        BLOOD CULTURE RECEIVED NO GROWTH TO DATE CULTURE WILL BE HELD FOR 5 DAYS BEFORE ISSUING A FINAL NEGATIVE REPORT   Report Status PENDING   Incomplete   URINE CULTURE     Status: Normal   Collection Time   03/12/11  6:12 AM      Component Value Range Status Comment   Specimen Description URINE, RANDOM   Final    Special Requests Normal   Final    Culture  Setup Time [id number]   Final    Colony Count NO GROWTH   Final    Culture NO GROWTH   Final    Report Status 03/13/2011 FINAL   Final     Studies/Results: No results found.   Assessment/Plan: Postoperative Fever, decreasing WBC diet controlled DM  hypothyroid  L4-5 decompression//fusion Jan 2006 L3-5 decompression/fusion 03-05-11. By 03-07-11 she developed temp to 100.9.  PEN allergy- throat closes up, breaks up in hives  Anbx day  5 Cubicin/ aztreonam/clinda (cubicin for leukopenia and rare possibility of it being vanco induced) S/p aspiration with cultures pending.    Staci Righter Infectious Diseases [phone] 03/14/2011, 6:40 PM

## 2011-03-14 NOTE — Procedures (Signed)
US aspiration of lumbar subcutaneous collection  40ml old blood, sent for GS, C&S No complication No blood loss. See complete dictation in [facility].

## 2011-03-15 ENCOUNTER — Encounter (HOSPITAL_COMMUNITY): Payer: Self-pay | Admitting: Cardiology

## 2011-03-15 MED ORDER — METOPROLOL SUCCINATE ER 25 MG PO TB24
25.0000 mg | ORAL_TABLET | Freq: Every day | ORAL | Status: DC
  Administered 2011-03-15 – 2011-03-17 (×3): 25 mg via ORAL
  Filled 2011-03-15 (×3): qty 1

## 2011-03-15 MED ORDER — METOPROLOL SUCCINATE ER 25 MG PO TB24: 25.0000 mg | ORAL_TABLET | Freq: Every day | ORAL | Status: DC

## 2011-03-15 NOTE — Plan of Care (Signed)
Problem: Consults Goal: Diagnosis - Spinal Surgery Lumbar Laminectomy (Complex)     

## 2011-03-15 NOTE — Progress Notes (Signed)
Physical Therapy Treatment Patient Details Name: Megan [name] MRN: [id number] DOB: 04-17-1965 Today's Date: 03/15/2011  PT Assessment/Plan  PT - Assessment/Plan Comments on Treatment Session: Pt mobilizing well, only requires cues for precautions and for posture. Will likely only perform 1-2 more treatments, practicing stairs and decreasing use of RW. PT Plan: Discharge plan remains appropriate;Frequency remains appropriate PT Frequency: Min 5X/week Follow Up Recommendations: Home health PT;Supervision - Intermittent Equipment Recommended: Rolling walker with 5" wheels PT Goals  Acute Rehab PT Goals PT Goal: Rolling Supine to Right Side - Progress: Met PT Goal: Supine/Side to Sit - Progress: Met PT Goal: Sit to Stand - Progress: Met Pt will Ambulate: >150 feet;with modified independence;with rolling walker PT Goal: Ambulate - Progress: Progressing toward goal Pt will Go Up / Down Stairs: 3-5 stairs;with supervision;with least restrictive assistive device PT Goal: Up/Down Stairs - Progress: Progressing toward goal Additional Goals Additional Goal #1: pt will verbalize and follow 3/3 back precautions and demonstrate adherence throughout session  PT Goal: Additional Goal #1 - Progress: Progressing toward goal  PT Treatment Precautions/Restrictions  Precautions Precautions: Back Precaution Booklet Issued: No Precaution Comments: Pt required cuing in bathroom and when trying to reach for objects in chair for proper body mechanics.  Required Braces or Orthoses: Yes Spinal Brace: Lumbar corset;Applied in sitting position Restrictions Weight Bearing Restrictions: No Mobility (including Balance) Bed Mobility Bed Mobility: Yes Rolling Right: 7: Independent Right Sidelying to Sit: 7: Independent Sitting - Scoot to Edge of Bed: 6: Modified independent (Device/Increase time) Transfers Transfers: Yes Sit to Stand: With upper extremity assist;From bed;From toilet;6: Modified independent  (Device/Increase time) Stand to Sit: 5: Supervision;With upper extremity assist;To chair/3-in-1 Stand to Sit Details: VC for hand placement for safety upon sitting. Supervision for safety Ambulation/Gait Ambulation/Gait: Yes Ambulation/Gait Assistance: 5: Supervision Ambulation/Gait Assistance Details (indicate cue type and reason): Verbal cues (repeated) for improved posture, decreased reliance on RW. Pt continues to feel like she needs RW secondary to LtLE weakness. Cues for improved forefoot clearance. Pt declining further distance ambulation or stairs today.  Ambulation Distance (Feet): 150 Feet Assistive device: Rolling walker Gait Pattern: Decreased stride length;Step-through pattern;Trunk flexed;Decreased hip/knee flexion - left Gait velocity: Decreased gait speed Stairs: No    Exercise  General Exercises - Lower Extremity Ankle Circles/Pumps: AROM;Both;10 reps;Seated End of Session PT - End of Session Equipment Utilized During Treatment: Gait belt;Back brace Activity Tolerance: Patient tolerated treatment well Patient left: with call bell in reach;in chair Nurse Communication: Mobility status for transfers;Mobility status for ambulation General Behavior During Session: [facility] for tasks performed Cognition: [facility] for tasks performed  Sherie Don 03/15/2011, 6:17 PM  Sherie Don) Carleene Mains PT, DPT Acute Rehabilitation [phone]
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
## 2011-03-15 NOTE — Progress Notes (Signed)
Subjective: Patient reports First day without fever feels somewhat lightheaded  Objective: Vital signs in last 24 hours: Temp:  [98.4 F (36.9 C)-99.1 F (37.3 C)] 98.4 F (36.9 C) (03/02 0933) Pulse Rate:  [59-70] 62  (03/02 0933) Resp:  [18-20] 20  (03/02 0933) BP: (96-114)/(61-78) 114/75 mmHg (03/02 0933) SpO2:  [96 %-100 %] 96 % (03/02 0933)  Intake/Output from previous day:   Intake/Output this shift:    Motor function intact ambulatory.  Lab Results: No results found for this basename: WBC:2,HGB:2,HCT:2,PLT:2 in the last 72 hours BMET No results found for this basename: NA:2,K:2,CL:2,CO2:2,GLUCOSE:2,BUN:2,CREATININE:2,CALCIUM:2 in the last 72 hours  Studies/Results: No results found.  Assessment/Plan: Stable save for some elements of hypotension and lightheadedness. Await results of wound aspiration.  LOS: 10 days  Continue antibiotic treatment patient requests cardiology consult for hypotension.   Jamile Sivils J 03/15/2011, 12:44 PM

## 2011-03-16 LAB — CULTURE, BLOOD (ROUTINE X 2)
Culture  Setup Time: 201302251359
Culture  Setup Time: 201302251400
Culture: NO GROWTH
Culture: NO GROWTH

## 2011-03-16 NOTE — Progress Notes (Signed)
ANTIBIOTIC CONSULT NOTE - FOLLOW UP  Pharmacy Consult for Daptomycin Indication: Post-operative fevers  Allergies  Allergen Reactions  . Hydrocodone Anaphylaxis  . Penicillins Rash    Throat swells up as well  . Morphine Other (See Comments)    REACTION: irregular heart beat  . Codeine Rash    Patient Measurements: Height: 5\' 7"  (170.2 cm) Weight: 257 lb 15 oz (117 kg) IBW/kg (Calculated) : 61.6   Vital Signs: Temp: 97.9 F (36.6 C) (03/03 0959) Temp src: Oral (03/03 0959) BP: 106/72 mmHg (03/03 0959) Pulse Rate: 75  (03/03 0959) Intake/Output from previous day: 03/02 0701 - 03/03 0700 In: 1100 [IV Piggyback:1100] Out: -  Intake/Output from this shift:    Labs: No results found for this basename: WBC:3,HGB:3,PLT:3,LABCREA:3,CREATININE:3, in the last 72 hours Estimated Creatinine Clearance: 115 ml/min (by C-G formula based on Cr of 0.39). No results found for this basename: VANCOTROUGH:2,VANCOPEAK:2,VANCORANDOM:2,GENTTROUGH:2,GENTPEAK:2,GENTRANDOM:2,TOBRATROUGH:2,TOBRAPEAK:2,TOBRARND:2,AMIKACINPEAK:2,AMIKACINTROU:2,AMIKACIN:2, in the last 72 hours   Microbiology: Recent Results (from the past 720 hour(s))  SURGICAL PCR SCREEN     Status: Abnormal   Collection Time   02/26/11  2:18 PM      Component Value Range Status Comment   MRSA, PCR NEGATIVE  NEGATIVE  Final    Staphylococcus aureus POSITIVE (*) NEGATIVE  Final   CULTURE, BLOOD (ROUTINE X 2)     Status: Normal   Collection Time   03/10/11  9:22 AM      Component Value Range Status Comment   Specimen Description BLOOD LEFT ARM   Final    Special Requests BOTTLES DRAWN AEROBIC AND ANAEROBIC 10CC   Final    Culture  Setup Time [id number]   Final    Culture NO GROWTH 5 DAYS   Final    Report Status 03/16/2011 FINAL   Final   CULTURE, BLOOD (ROUTINE X 2)     Status: Normal   Collection Time   03/10/11  9:28 AM      Component Value Range Status Comment   Specimen Description BLOOD RIGHT ARM   Final    Special  Requests BOTTLES DRAWN AEROBIC AND ANAEROBIC 10CC   Final    Culture  Setup Time [id number]   Final    Culture NO GROWTH 5 DAYS   Final    Report Status 03/16/2011 FINAL   Final   CULTURE, BLOOD (ROUTINE X 2)     Status: Normal (Preliminary result)   Collection Time   03/11/11  7:42 PM      Component Value Range Status Comment   Specimen Description BLOOD LEFT ARM   Final    Special Requests BOTTLES DRAWN AEROBIC AND ANAEROBIC 10CC   Final    Culture  Setup Time [id number]   Final    Culture     Final    Value:        BLOOD CULTURE RECEIVED NO GROWTH TO DATE CULTURE WILL BE HELD FOR 5 DAYS BEFORE ISSUING A FINAL NEGATIVE REPORT   Report Status PENDING   Incomplete   CULTURE, BLOOD (ROUTINE X 2)     Status: Normal (Preliminary result)   Collection Time   03/11/11  7:51 PM      Component Value Range Status Comment   Specimen Description BLOOD LEFT HAND   Final    Special Requests BOTTLES DRAWN AEROBIC AND ANAEROBIC 10CC   Final    Culture  Setup Time [id number]   Final    Culture     Final    Value:  BLOOD CULTURE RECEIVED NO GROWTH TO DATE CULTURE WILL BE HELD FOR 5 DAYS BEFORE ISSUING A FINAL NEGATIVE REPORT   Report Status PENDING   Incomplete   URINE CULTURE     Status: Normal   Collection Time   03/12/11  6:12 AM      Component Value Range Status Comment   Specimen Description URINE, RANDOM   Final    Special Requests Normal   Final    Culture  Setup Time [id number]   Final    Colony Count NO GROWTH   Final    Culture NO GROWTH   Final    Report Status 03/13/2011 FINAL   Final   CULTURE, ROUTINE-ABSCESS     Status: Normal (Preliminary result)   Collection Time   03/14/11  1:44 PM      Component Value Range Status Comment   Specimen Description ABSCESS   Final    Special Requests POSTERIOR LUMBAR SUBCUTANEOUS COLLECTION   Final    Gram Stain PENDING   Incomplete    Culture NO GROWTH 1 DAY   Final    Report Status PENDING   Incomplete     Anti-infectives      Start     Dose/Rate Route Frequency Ordered Stop   03/13/11 1200   DAPTOmycin (CUBICIN) 702 mg in sodium chloride 0.9 % IVPB        6 mg/kg  117 kg 228.1 mL/hr over 30 Minutes Intravenous Every 24 hours 03/13/11 1103     03/13/11 0200   vancomycin (VANCOCIN) IVPB 1000 mg/200 mL premix  Status:  Discontinued        1,000 mg 200 mL/hr over 60 Minutes Intravenous Every 8 hours 03/12/11 1911 03/13/11 1046   03/12/11 2000   ciprofloxacin (CIPRO) IVPB 400 mg  Status:  Discontinued        400 mg 200 mL/hr over 60 Minutes Intravenous Every 12 hours 03/11/11 1836 03/11/11 1837   03/11/11 2200   clindamycin (CLEOCIN) IVPB 300 mg  Status:  Discontinued        300 mg 100 mL/hr over 30 Minutes Intravenous 3 times per day 03/11/11 1855 03/16/11 1211   03/11/11 2200   imipenem-cilastatin (PRIMAXIN) 500 mg in sodium chloride 0.9 % 100 mL IVPB  Status:  Discontinued        500 mg 200 mL/hr over 30 Minutes Intravenous 3 times per day 03/11/11 1855 03/11/11 1858   03/11/11 2000   ciprofloxacin (CIPRO) IVPB 400 mg  Status:  Discontinued        400 mg 200 mL/hr over 60 Minutes Intravenous Every 12 hours 03/11/11 1837 03/11/11 1855   03/11/11 2000   aztreonam (AZACTAM) injection 2 g  Status:  Discontinued        2 g Intramuscular Every 8 hours 03/11/11 1858 03/11/11 1909   03/11/11 2000   aztreonam (AZACTAM) 2 g in dextrose 5 % 50 mL IVPB        2 g 100 mL/hr over 30 Minutes Intravenous Every 8 hours 03/11/11 1910     03/11/11 0500   vancomycin (VANCOCIN) IVPB 1000 mg/200 mL premix  Status:  Discontinued        1,000 mg 200 mL/hr over 60 Minutes Intravenous Every 12 hours 03/10/11 1621 03/12/11 1911   03/10/11 1700   ciprofloxacin (CIPRO) IVPB 400 mg  Status:  Discontinued        400 mg 200 mL/hr over 60 Minutes Intravenous Every 12 hours 03/10/11 1605 03/11/11  1836   03/10/11 1630   vancomycin (VANCOCIN) 2,500 mg in sodium chloride 0.9 % 500 mL IVPB        2,500 mg 250 mL/hr over 120 Minutes  Intravenous  Once 03/10/11 1621 03/10/11 2114   03/10/11 1200   moxifloxacin (AVELOX) IVPB 400 mg  Status:  Discontinued        400 mg 250 mL/hr over 60 Minutes Intravenous Every 24 hours 03/10/11 1117 03/10/11 1605   03/09/11 1200   fluconazole (DIFLUCAN) tablet 150 mg        150 mg Oral  Once 03/09/11 0918 03/09/11 1129   03/05/11 2200   vancomycin (VANCOCIN) 1,750 mg in sodium chloride 0.9 % 500 mL IVPB        1,750 mg 250 mL/hr over 120 Minutes Intravenous  Once 03/05/11 1653 03/06/11 0032   03/05/11 0945   vancomycin (VANCOCIN) 500 mg in sodium chloride 0.9 % 100 mL IVPB  Status:  Discontinued        500 mg 100 mL/hr over 60 Minutes Intravenous To Surgery 03/05/11 0939 03/05/11 1524   03/05/11 0935   bacitracin 50,000 Units in sodium chloride irrigation 0.9 % 500 mL irrigation  Status:  Discontinued          As needed 03/05/11 0935 03/05/11 1255   03/05/11 0834   vancomycin (VANCOCIN) 1 GM/200ML IVPB     CommentsSharlene Dory [facility]): cabinet override         03/05/11 0834 03/05/11 0950   03/05/11 0828   bacitracin 19147 UNITS injection     Comments: Worthy Flank: cabinet override         03/05/11 0828 03/05/11 2029   03/04/11 1547   vancomycin (VANCOCIN) 1,500 mg in sodium chloride 0.9 % 500 mL IVPB  Status:  Discontinued        1,500 mg 250 mL/hr over 120 Minutes Intravenous 120 min pre-op 03/04/11 1547 03/05/11 0938          Assessment: Post-operative fevers: On Day #6 Aztreonam and Day #4 Daptomycin empirically.  She is afebrile.  Her renal function and CBC has not been assessed in the past few days and warrants assessment to monitor for adverse reactions to her antimicrobial regimen.  Her baseline CK was WNL so will monitor weekly on while on Daptomycin.  Goal of Therapy:  Appropriate antimicrobial dosing  Plan:  Continue current Aztreonam and Daptomycin regimens Check BMET and CBC 3/4.  Estella Husk, Pharm.D., BCPS Clinical Pharmacist  Pager  [phone] 03/16/2011, 2:53 PM

## 2011-03-16 NOTE — Progress Notes (Signed)
Subjective: Patient reports being ready to go home.  Objective: Vital signs in last 24 hours: Temp:  [97.6 F (36.4 C)-98.7 F (37.1 C)] 98.4 F (36.9 C) (03/03 0643) Pulse Rate:  [54-77] 54  (03/03 0643) Resp:  [19-20] 20  (03/03 0643) BP: (106-120)/(70-78) 106/74 mmHg (03/03 0643) SpO2:  [96 %-98 %] 96 % (03/03 0643)  Intake/Output from previous day: 03/02 0701 - 03/03 0700 In: 1100 [IV Piggyback:1100] Out: -  Intake/Output this shift:    Neurologic: Alert and oriented X 3, normal strength and tone. Normal symmetric reflexes. Normal coordination and gaitWound clean, dry no signs infection.  Lab Results: No results found for this basename: WBC:2,HGB:2,HCT:2,PLT:2 in the last 72 hours BMET No results found for this basename: NA:2,K:2,CL:2,CO2:2,GLUCOSE:2,BUN:2,CREATININE:2,CALCIUM:2 in the last 72 hours  Studies/Results: No results found.  Assessment/Plan: Await ID conclusions. She is ready to go, but the ID situation does not seem settled.  LOS: 11 days     Afton Mikelson L 03/16/2011, 9:23 AM

## 2011-03-16 NOTE — Progress Notes (Signed)
Subjective: Dizziness and palpitations have resolved. BP in desirable range.  Objective: Vital signs in last 24 hours: Temp:  [97.6 F (36.4 C)-98.7 F (37.1 C)] 98.4 F (36.9 C) (03/03 0643) Pulse Rate:  [54-77] 54  (03/03 0643) Resp:  [19-20] 20  (03/03 0643) BP: (106-120)/(70-78) 106/74 mmHg (03/03 0643) SpO2:  [96 %-98 %] 96 % (03/03 0643) Weight change:  Last BM Date: 03/14/11 Intake/Output from previous day:+ 1100 03/02 0701 - 03/03 0700 In: 1100 [IV Piggyback:1100] Out: -  Intake/Output this shift:    PE:  General appearance: alert, cooperative, no distress and severely obese  Neck: no adenopathy, no carotid bruit, no JVD, supple, symmetrical, trachea midline and thyroid not enlarged, symmetric, no tenderness/mass/nodules  Lungs: clear to auscultation bilaterally  Heart: regular rate and rhythm, S1, S2 normal, no murmur, click, rub or gallop  Abdomen: soft, non-tender; bowel sounds normal; no masses, no organomegaly  Extremities: extremities normal, atraumatic, no cyanosis or edema  Pulses: 2+ and symmetric  Skin: Skin color, texture, turgor normal. No rashes or lesions  Neurologic: Grossly normal     Lab Results: No results found for this basename: WBC:2,HGB:2,HCT:2,PLT:2 in the last 72 hours BMET No results found for this basename: NA:2,K:2,CL:2,CO2:2,GLUCOSE:2,BUN:2,CREATININE:2,CALCIUM:2,MAGNESIUM:2 in the last 72 hours No results found for this basename: TROPONINI:2,CK,MB:2 in the last 72 hours  No results found for this basename: CHOL, HDL, LDLCALC, LDLDIRECT, TRIG, CHOLHDL   Lab Results  Component Value Date   HGBA1C 5.4 02/03/2011     Lab Results  Component Value Date   TSH 1.889 02/03/2011    Hepatic Function Panel No results found for this basename: PROT,ALBUMIN,AST,ALT,ALKPHOS,BILITOT,BILIDIR,IBILI in the last 72 hours No results found for this basename: CHOL in the last 72 hours No results found for this basename: PROTIME in the last 72  hours    EKG: Orders placed during the hospital encounter of 03/05/11  . EKG 12-LEAD  . EKG 12-LEAD    Studies/Results: No results found.  Medications: I have reviewed the patient's current medications.    Marland Kitchen aztreonam  2 g Intravenous Q8H  . clindamycin (CLEOCIN) IV  300 mg Intravenous Q8H  . DAPTOmycin (CUBICIN)  IV  6 mg/kg Intravenous Q24H  . docusate sodium  100 mg Oral BID  . gabapentin  400 mg Oral TID  . levothyroxine  175 mcg Oral Q0600  . metaxalone  800 mg Oral TID  . metoprolol succinate  25 mg Oral Daily  . DISCONTD: lisinopril  10 mg Oral Daily  . DISCONTD: metoprolol succinate  25 mg Oral Daily  . DISCONTD: metoprolol succinate  50 mg Oral Daily   Assessment/Plan: Patient Active Problem List  Diagnoses  . RETINOBLASTOMA  . HYPOTHYROIDISM  . DIABETES MELLITUS, TYPE II  . HYPERLIPIDEMIA  . ANEMIA  . CHRONIC PAIN SYNDROME  . HYPERTENSION  . GASTROPARESIS  . AUTOIMMUNE HEPATITIS  . HEMOCCULT POSITIVE STOOL  . INTERTRIGO, CANDIDAL  . OSTEOARTHRITIS  . OTHER UNSPECIFIED BACK DISORDER  . FIBROMYALGIA  . HAND PAIN, BILATERAL  . HEADACHE  . RIB PAIN, RIGHT SIDED  . ARRHYTHMIA, HX OF  . FATTY LIVER DISEASE, HX OF  . BARIATRIC SURGERY STATUS  . Iron deficiency anemia due to dietary causes  . Anemia, macrocytic  . Bronchitis with bronchospasm   PLAN: BP improved with holding lisinopril and lower dose of metoprolol.   HR down to 54 but if no further dizziness would continue.   LOS: 11 days   INGOLD,LAURA R  I have  seen and examined the patient along with [facility] R, NP.  I have reviewed the chart, notes and new data.  I agree with PA/NP's note.  Would keep on metoprolol succinate 25 mg daily until follow-up with Dr. Alanda Amass, which we will set up in short order. Lisinopril stopped. Please reconsult as needed.  Thurmon Fair, MD, [facility] [phone] 03/16/2011, 11:11 AM  03/16/2011, 9:58 AM  '

## 2011-03-17 LAB — BASIC METABOLIC PANEL
BUN: 6 mg/dL (ref 6–23)
CO2: 29 mEq/L (ref 19–32)
Calcium: 8.7 mg/dL (ref 8.4–10.5)
Chloride: 106 mEq/L (ref 96–112)
Creatinine, Ser: 0.43 mg/dL — ABNORMAL LOW (ref 0.50–1.10)
GFR calc Af Amer: >90 mL/min (ref >90)
GFR calc non Af Amer: >90 mL/min (ref >90)
Glucose, Bld: 119 mg/dL — ABNORMAL HIGH (ref 70–99)
Potassium: 3.7 mEq/L (ref 3.5–5.1)
Sodium: 140 mEq/L (ref 135–145)

## 2011-03-17 LAB — CBC
HCT: 33.3 % — ABNORMAL LOW (ref 36.0–46.0)
Hemoglobin: 11.5 g/dL — ABNORMAL LOW (ref 12.0–15.0)
MCH: 29.5 pg (ref 26.0–34.0)
MCHC: 34.5 g/dL (ref 30.0–36.0)
MCV: 85.4 fL (ref 78.0–100.0)
Platelets: 204 10*3/uL (ref 150–400)
RBC: 3.9 MIL/uL (ref 3.87–5.11)
RDW: 13.9 % (ref 11.5–15.5)
WBC: 5.3 10*3/uL (ref 4.0–10.5)

## 2011-03-17 MED ORDER — GABAPENTIN 400 MG PO CAPS: 400.0000 mg | ORAL_CAPSULE | Freq: Three times a day (TID) | ORAL | Status: DC

## 2011-03-17 MED ORDER — DSS 100 MG PO CAPS: 100.0000 mg | ORAL_CAPSULE | Freq: Two times a day (BID) | ORAL | Status: AC

## 2011-03-17 MED ORDER — OXYCODONE HCL 30 MG PO TABS: 30.0000 mg | ORAL_TABLET | ORAL | Status: AC | PRN

## 2011-03-17 MED ORDER — METAXALONE 800 MG PO TABS: 800.0000 mg | ORAL_TABLET | Freq: Three times a day (TID) | ORAL | Status: AC

## 2011-03-17 NOTE — Progress Notes (Signed)
CARE MANAGEMENT NOTE 03/17/2011      Action/Plan:   03/17/11- Discharge planning completed.   Anticipated DC Date:  03/17/2011   Anticipated DC Plan:  HOME W HOME HEALTH SERVICES      DC Planning Services  CM consult      [facility]  HOME HEALTH   Choice offered to / List presented to:  C-1 Patient   DME arranged  SHOWER STOOL  WALKER - ROLLING      DME agency  [facility]     HH arranged  HH-2 PT      [facility] agency  [facility]   Status of service:  Completed, signed off Medicare Important Message given?   (If response is "NO", the following Medicare IM given date fields will be blank) Date Medicare IM given:   Date Additional Medicare IM given:    Discharge Disposition:  HOME W HOME HEALTH SERVICES  Per UR Regulation:    Comments:  03/10/2011 1530 Spoke to pt and states she prefers [facility]. Message left for MD for HHPT orders and DME. PT recommended HH PT at time of d/c. Contacted [facility] for pending referral. Pt requesting 3n1, RW and tub bench. Requesting reacher and explained item is not a covered DME with insurance. Pt will to pay out of pocket. Will have [facility] give pt price for item. Isidoro Donning RN CCM Case Mgmt phone [phone]

## 2011-03-17 NOTE — Progress Notes (Signed)
Physical Therapy Treatment Patient Details Name: Megan [name] MRN: [id number] DOB: 07-19-1965 Today's Date: 03/17/2011  PT Assessment/Plan  PT - Assessment/Plan Comments on Treatment Session: Had long discussion with pt explaining benefits of establishing a walking program with safe progression. Pt interested in learning health benefits of walking. Pt ambulating great with RW, performed stairs well with no loss of balance. Strongly encouraged pt to follow back precautions while at home. Discussed DME - pt now reporting she will need tub transfer bench to safely get into and out of tub. RN made aware. PT Plan: Discharge plan remains appropriate;Frequency remains appropriate PT Frequency: Min 5X/week Follow Up Recommendations: Home health PT;Supervision - Intermittent Equipment Recommended: Rolling walker with 5" wheels, Shower/tub transfer bench PT Goals  Acute Rehab PT Goals Pt will go Sit to Stand: Independently PT Goal: Sit to Stand - Progress: Goal set today Pt will Ambulate: >150 feet;with modified independence;with rolling walker PT Goal: Ambulate - Progress: Progressing toward goal Pt will Go Up / Down Stairs: 3-5 stairs;with supervision;with least restrictive assistive device PT Goal: Up/Down Stairs - Progress: Progressing toward goal Additional Goals Additional Goal #1: pt will verbalize and follow 3/3 back precautions and demonstrate adherence throughout session  PT Goal: Additional Goal #1 - Progress: Progressing toward goal  PT Treatment Precautions/Restrictions  Precautions Precautions: Back Precaution Booklet Issued: No Precaution Comments: Verbally reviewed back precautions with multiple instances in the house such as not picking up children, not reaching for food unless beside body, utilizing reach aid, and appropriate bathroom mobility. Required Braces or Orthoses: Yes Spinal Brace: Lumbar corset;Applied in sitting position Restrictions Weight Bearing Restrictions:  No Mobility (including Balance) Bed Mobility Bed Mobility: No (seated at start) Transfers Transfers: Yes Sit to Stand: With upper extremity assist;6: Modified independent (Device/Increase time);From chair/3-in-1 Stand to Sit: To chair/3-in-1;6: Modified independent (Device/Increase time);With upper extremity assist Ambulation/Gait Ambulation/Gait: Yes Ambulation/Gait Assistance: 5: Supervision Ambulation/Gait Assistance Details (indicate cue type and reason): Verbal cues (repeated) for improved posture, decreased reliance on RW. Ambulation Distance (Feet): 170 Feet Assistive device: Rolling walker Gait Pattern: Decreased stride length;Step-through pattern;Trunk flexed;Decreased hip/knee flexion - left Gait velocity: Decreased gait speed Stairs: Yes Stairs Assistance: Other (comment) (min-guard assist) Stairs Assistance Details (indicate cue type and reason): Verbal cues for sequencing and safety. Verbal cues and visual demonstration for maintaining neutral spine as pt tends to have excessive spinal motion.  Stair Management Technique: One rail Right;Step to pattern;Sideways (per pt preference) Number of Stairs: 4     End of Session PT - End of Session Equipment Utilized During Treatment: Gait belt;Back brace Activity Tolerance: Patient tolerated treatment well Patient left: with call bell in reach;in chair Nurse Communication: Mobility status for transfers;Mobility status for ambulation (DME needs) General Behavior During Session: [facility] for tasks performed Cognition: [facility] for tasks performed  Sherie Don 03/17/2011, 1:35 PM  Sherie Don) Carleene Mains PT, DPT Acute Rehabilitation [phone]

## 2011-03-17 NOTE — Progress Notes (Signed)
INFECTIOUS DISEASE PROGRESS NOTE  ID: Megan [name] is a 46 y.o. female with fever, now resolved and negative cultures.    Subjective: Ready for d/c  Abtx:  Anti-infectives     Start     Dose/Rate Route Frequency Ordered Stop   03/13/11 1200   DAPTOmycin (CUBICIN) 702 mg in sodium chloride 0.9 % IVPB        6 mg/kg  117 kg 228.1 mL/hr over 30 Minutes Intravenous Every 24 hours 03/13/11 1103     03/13/11 0200   vancomycin (VANCOCIN) IVPB 1000 mg/200 mL premix  Status:  Discontinued        1,000 mg 200 mL/hr over 60 Minutes Intravenous Every 8 hours 03/12/11 1911 03/13/11 1046   03/12/11 2000   ciprofloxacin (CIPRO) IVPB 400 mg  Status:  Discontinued        400 mg 200 mL/hr over 60 Minutes Intravenous Every 12 hours 03/11/11 1836 03/11/11 1837   03/11/11 2200   clindamycin (CLEOCIN) IVPB 300 mg  Status:  Discontinued        300 mg 100 mL/hr over 30 Minutes Intravenous 3 times per day 03/11/11 1855 03/16/11 1211   03/11/11 2200   imipenem-cilastatin (PRIMAXIN) 500 mg in sodium chloride 0.9 % 100 mL IVPB  Status:  Discontinued        500 mg 200 mL/hr over 30 Minutes Intravenous 3 times per day 03/11/11 1855 03/11/11 1858   03/11/11 2000   ciprofloxacin (CIPRO) IVPB 400 mg  Status:  Discontinued        400 mg 200 mL/hr over 60 Minutes Intravenous Every 12 hours 03/11/11 1837 03/11/11 1855   03/11/11 2000   aztreonam (AZACTAM) injection 2 g  Status:  Discontinued        2 g Intramuscular Every 8 hours 03/11/11 1858 03/11/11 1909   03/11/11 2000   aztreonam (AZACTAM) 2 g in dextrose 5 % 50 mL IVPB        2 g 100 mL/hr over 30 Minutes Intravenous Every 8 hours 03/11/11 1910     03/11/11 0500   vancomycin (VANCOCIN) IVPB 1000 mg/200 mL premix  Status:  Discontinued        1,000 mg 200 mL/hr over 60 Minutes Intravenous Every 12 hours 03/10/11 1621 03/12/11 1911   03/10/11 1700   ciprofloxacin (CIPRO) IVPB 400 mg  Status:  Discontinued        400 mg 200 mL/hr over 60 Minutes  Intravenous Every 12 hours 03/10/11 1605 03/11/11 1836   03/10/11 1630   vancomycin (VANCOCIN) 2,500 mg in sodium chloride 0.9 % 500 mL IVPB        2,500 mg 250 mL/hr over 120 Minutes Intravenous  Once 03/10/11 1621 03/10/11 2114   03/10/11 1200   moxifloxacin (AVELOX) IVPB 400 mg  Status:  Discontinued        400 mg 250 mL/hr over 60 Minutes Intravenous Every 24 hours 03/10/11 1117 03/10/11 1605   03/09/11 1200   fluconazole (DIFLUCAN) tablet 150 mg        150 mg Oral  Once 03/09/11 0918 03/09/11 1129   03/05/11 2200   vancomycin (VANCOCIN) 1,750 mg in sodium chloride 0.9 % 500 mL IVPB        1,750 mg 250 mL/hr over 120 Minutes Intravenous  Once 03/05/11 1653 03/06/11 0032   03/05/11 0945   vancomycin (VANCOCIN) 500 mg in sodium chloride 0.9 % 100 mL IVPB  Status:  Discontinued  500 mg 100 mL/hr over 60 Minutes Intravenous To Surgery 03/05/11 0939 03/05/11 1524   03/05/11 0935   bacitracin 50,000 Units in sodium chloride irrigation 0.9 % 500 mL irrigation  Status:  Discontinued          As needed 03/05/11 0935 03/05/11 1255   03/05/11 0834   vancomycin (VANCOCIN) 1 GM/200ML IVPB     Comments: Sharlene Dory [facility]): cabinet override         03/05/11 0834 03/05/11 0950   03/05/11 0828   bacitracin 11914 UNITS injection     Comments: Worthy Flank: cabinet override         03/05/11 0828 03/05/11 2029   03/04/11 1547   vancomycin (VANCOCIN) 1,500 mg in sodium chloride 0.9 % 500 mL IVPB  Status:  Discontinued        1,500 mg 250 mL/hr over 120 Minutes Intravenous 120 min pre-op 03/04/11 1547 03/05/11 0938          Medications: I have reviewed the patient's current medications.  Objective: Vital signs in last 24 hours: Temp:  [97.7 F (36.5 C)-98.6 F (37 C)] 98.6 F (37 C) (03/04 0046) Pulse Rate:  [57-68] 64  (03/04 0046) Resp:  [19-20] 20  (03/04 0046) BP: (100-111)/(62-73) 110/62 mmHg (03/04 0046) SpO2:  [96 %-100 %] 96 % (03/04 0046)   General appearance:  alert and no distress Cardio: regular rate and rhythm, S1, S2 normal, no murmur, click, rub or gallop  Lab Results  Basename 03/17/11 0610  WBC 5.3  HGB 11.5*  HCT 33.3*  NA 140  K 3.7  CL 106  CO2 29  BUN 6  CREATININE 0.43*  GLU --   Liver Panel No results found for this basename: PROT:2,ALBUMIN:2,AST:2,ALT:2,ALKPHOS:2,BILITOT:2,BILIDIR:2,IBILI:2 in the last 72 hours Sedimentation Rate No results found for this basename: ESRSEDRATE in the last 72 hours C-Reactive Protein No results found for this basename: CRP:2 in the last 72 hours  Microbiology: Recent Results (from the past 240 hour(s))  CULTURE, BLOOD (ROUTINE X 2)     Status: Normal   Collection Time   03/10/11  9:22 AM      Component Value Range Status Comment   Specimen Description BLOOD LEFT ARM   Final    Special Requests BOTTLES DRAWN AEROBIC AND ANAEROBIC 10CC   Final    Culture  Setup Time [id number]   Final    Culture NO GROWTH 5 DAYS   Final    Report Status 03/16/2011 FINAL   Final   CULTURE, BLOOD (ROUTINE X 2)     Status: Normal   Collection Time   03/10/11  9:28 AM      Component Value Range Status Comment   Specimen Description BLOOD RIGHT ARM   Final    Special Requests BOTTLES DRAWN AEROBIC AND ANAEROBIC 10CC   Final    Culture  Setup Time [id number]   Final    Culture NO GROWTH 5 DAYS   Final    Report Status 03/16/2011 FINAL   Final   CULTURE, BLOOD (ROUTINE X 2)     Status: Normal (Preliminary result)   Collection Time   03/11/11  7:42 PM      Component Value Range Status Comment   Specimen Description BLOOD LEFT ARM   Final    Special Requests BOTTLES DRAWN AEROBIC AND ANAEROBIC 10CC   Final    Culture  Setup Time [id number]   Final    Culture     Final  Value:        BLOOD CULTURE RECEIVED NO GROWTH TO DATE CULTURE WILL BE HELD FOR 5 DAYS BEFORE ISSUING A FINAL NEGATIVE REPORT   Report Status PENDING   Incomplete   CULTURE, BLOOD (ROUTINE X 2)     Status: Normal (Preliminary  result)   Collection Time   03/11/11  7:51 PM      Component Value Range Status Comment   Specimen Description BLOOD LEFT HAND   Final    Special Requests BOTTLES DRAWN AEROBIC AND ANAEROBIC 10CC   Final    Culture  Setup Time [id number]   Final    Culture     Final    Value:        BLOOD CULTURE RECEIVED NO GROWTH TO DATE CULTURE WILL BE HELD FOR 5 DAYS BEFORE ISSUING A FINAL NEGATIVE REPORT   Report Status PENDING   Incomplete   URINE CULTURE     Status: Normal   Collection Time   03/12/11  6:12 AM      Component Value Range Status Comment   Specimen Description URINE, RANDOM   Final    Special Requests Normal   Final    Culture  Setup Time [id number]   Final    Colony Count NO GROWTH   Final    Culture NO GROWTH   Final    Report Status 03/13/2011 FINAL   Final   CULTURE, ROUTINE-ABSCESS     Status: Normal (Preliminary result)   Collection Time   03/14/11  1:44 PM      Component Value Range Status Comment   Specimen Description ABSCESS   Final    Special Requests POSTERIOR LUMBAR SUBCUTANEOUS COLLECTION   Final    Gram Stain PENDING   Incomplete    Culture NO GROWTH 2 DAYS   Final    Report Status PENDING   Incomplete     Studies/Results: No results found.   Assessment/Plan: 1) fever - now resolved and really no fever since early am 3/1.  WBC is ok.  With no definitive source and no obvious signs, I would d/c antibiotics and observe.    Rhina Kramme Infectious Diseases 03/17/2011, 11:55 AM

## 2011-03-17 NOTE — Discharge Summary (Signed)
Physician Discharge Summary  Patient ID: Megan [name] MRN: [id number] DOB/AGE: October 12, 1965 46 y.o.  Admit date: 03/05/2011 Discharge date: 03/17/2011  Admission Diagnoses: L3-4 spinal stenosis, lumbago, lumbar radiculopathy  Discharge Diagnoses: The same Active Problems:  * No active hospital problems. *    Discharged Condition: good  Hospital Course: I admitted the patient to [facility] on 03/05/2011. That day I performed a exploration of her lumbar fusion with a L3-4 decompression instrumentation and fusion. The surgery went well. Postop course was remarkable only for some  persistent fevers. We work this up with a urinalysis blood cultures chest x-ray etc. I asked infectious disease to see the patient. She was further worked up with a lumbar CT which demonstrated fluid in the operative  field. The patient was treated empirically with antibiotics at the direction of infectious disease. We obtained a needle aspiration of this fluid in the patient's back. The cultures came back negative.  The patient's fever subsequently resolved. By 03/17/2011 the patient was feeling well, looked well, and was requesting discharge to home. I discussed the situation with Dr. Luciana Axe, infectious disease. We decided to discontinue the patient's antibiotics and observe her off antibiotics. She was discharged to home. I instructed her to take her temperature frequently and call me if it gets above 100.4. I gave the patient written and oral discharge instructions.  Consults: Infectious disease , interventional radiology Significant Diagnostic Studies: Lumbar CT, CT scan of the chest abdomen pelvis Treatments: L3-4 decompression instrumentation and fusion with exploration of prior lumbar fusion. Discharge Exam: Blood pressure 110/62, pulse 64, temperature 98.6 F (37 C), temperature source Oral, resp. rate 20, height 5\' 7"  (1.702 m), weight 117 kg (257 lb 15 oz), SpO2 96.00%. The patient is alert and  oriented. She looks and feels well. Her incision is well-healed without signs of infection. Her strength is normal in her lower extremities.  Disposition: Home  Discharge Orders    Future Appointments: Provider: Department: Dept Phone: Center:   04/08/2011 9:00 AM Melissa S. Peggyann Juba, NP [facility] [phone] [facility]   05/01/2011 1:15 PM Gwendolyn A. Maisie Fus [place] [phone] None   05/01/2011 1:45 PM [name], MD [facility] [phone] None     Future Orders Please Complete By Expires   Diet - low sodium heart healthy      Increase activity slowly      Discharge instructions      Comments:   Call [phone] for a followup appointment.   No wound care      Call MD for:  temperature >100.4      Call MD for:  persistant nausea and vomiting      Call MD for:  severe uncontrolled pain      Call MD for:  redness, tenderness, or signs of infection (pain, swelling, redness, odor or green/yellow discharge around incision site)      Call MD for:  difficulty breathing, headache or visual disturbances      Call MD for:  hives      Call MD for:  persistant dizziness or light-headedness      Call MD for:  extreme fatigue        Medication List  As of 03/17/2011 11:14 AM   TAKE these medications         albuterol 108 (90 BASE) MCG/ACT inhaler   Commonly known as: PROVENTIL HFA;VENTOLIN HFA   Inhale 2 puffs into the lungs every 6 (six) hours as needed. For wheezing  albuterol-ipratropium 18-103 MCG/ACT inhaler   Commonly known as: COMBIVENT   Inhale 2 puffs into the lungs 3 (three) times daily as needed.      aspirin 81 MG tablet   Take 81 mg by mouth daily.      B COMPLEX-B12 PO   Take 1 tablet by mouth daily.      cholecalciferol 1000 UNITS tablet   Commonly known as: VITAMIN D   Take 1,000 Units by mouth daily.      DSS 100 MG Caps   Take 100 mg by mouth 2 (two) times daily.      Fluticasone-Salmeterol 100-50 MCG/DOSE Aepb   Commonly known as:  ADVAIR   Inhale 1 puff into the lungs 2 (two) times daily.      gabapentin 400 MG capsule   Commonly known as: NEURONTIN   Take 1 capsule (400 mg total) by mouth 3 (three) times daily.      levothyroxine 175 MCG tablet   Commonly known as: SYNTHROID, LEVOTHROID   Take 175 mcg by mouth daily.      lisinopril 10 MG tablet   Commonly known as: PRINIVIL,ZESTRIL   Take 10 mg by mouth daily.      metaxalone 800 MG tablet   Commonly known as: SKELAXIN   Take 800 mg by mouth 3 (three) times daily.      metaxalone 800 MG tablet   Commonly known as: SKELAXIN   Take 1 tablet (800 mg total) by mouth 3 (three) times daily.      metoprolol succinate 50 MG 24 hr tablet   Commonly known as: TOPROL-XL   Take 50 mg by mouth 2 (two) times daily.      oxycodone 30 MG immediate release tablet   Commonly known as: ROXICODONE   Take 1 tablet (30 mg total) by mouth every 4 (four) hours as needed.      promethazine 50 MG tablet   Commonly known as: PHENERGAN   Take 50 mg by mouth every 6 (six) hours as needed. For nausea           Follow-up Information    Follow up with [facility]. (Home Health Physical Therapy)    Contact information:   [phone]      Follow up with Governor Rooks, MD. (Our office will call you with date and time to follow up with Dr. Alanda Amass in 7-10 days after discharge .)    Contact information:   [address]  [place] [address] [phone]          Signed: [name] 03/17/2011, 11:14 AM

## 2011-03-18 ENCOUNTER — Encounter: Payer: PRIVATE HEALTH INSURANCE | Admitting: Family

## 2011-03-18 LAB — CULTURE, BLOOD (ROUTINE X 2)
Culture  Setup Time: 201302270142
Culture  Setup Time: 201302270142
Culture: NO GROWTH
Culture: NO GROWTH

## 2011-03-18 LAB — CULTURE, ROUTINE-ABSCESS
Culture: NO GROWTH
Gram Stain: NONE SEEN

## 2011-03-26 ENCOUNTER — Other Ambulatory Visit: Payer: Self-pay | Admitting: Family

## 2011-03-26 ENCOUNTER — Other Ambulatory Visit: Payer: Self-pay | Admitting: *Deleted

## 2011-03-26 NOTE — Telephone Encounter (Signed)
Received message from pt that she just returned home from back surgery x 1 week. Did not realize she is almost out of Synthroid. States Dr Lucianne Muss usually fills this but we checked her TSH last in January and pt is requesting that we send rx to pharmacy for Brand name as she reports she cannot take the generic. Please advise.

## 2011-03-26 NOTE — Telephone Encounter (Signed)
OK to send rx for 30 day supply, but further refills should come from Dr. Lucianne Muss.

## 2011-03-27 NOTE — Telephone Encounter (Signed)
Refills sent to pharmacy, left message on machine to return my call.

## 2011-03-31 NOTE — Telephone Encounter (Signed)
Pt was notified on 03/28/11 and aware to f/u with Dr Lucianne Muss for further refills.

## 2011-04-08 ENCOUNTER — Encounter: Payer: PRIVATE HEALTH INSURANCE | Admitting: Family

## 2011-04-18 ENCOUNTER — Other Ambulatory Visit: Payer: Self-pay | Admitting: Family

## 2011-04-18 NOTE — Telephone Encounter (Signed)
Rx refill sent to pharmacy. 

## 2011-05-01 ENCOUNTER — Ambulatory Visit (HOSPITAL_BASED_OUTPATIENT_CLINIC_OR_DEPARTMENT_OTHER): Payer: PRIVATE HEALTH INSURANCE | Admitting: Hematology & Oncology

## 2011-05-01 ENCOUNTER — Other Ambulatory Visit (HOSPITAL_BASED_OUTPATIENT_CLINIC_OR_DEPARTMENT_OTHER): Payer: PRIVATE HEALTH INSURANCE | Admitting: Lab

## 2011-05-01 VITALS — BP 99/68 | HR 53 | Temp 97.2°F | Ht 67.0 in | Wt 262.0 lb

## 2011-05-01 DIAGNOSIS — D539 Nutritional anemia, unspecified: Secondary | ICD-10-CM

## 2011-05-01 DIAGNOSIS — D508 Other iron deficiency anemias: Secondary | ICD-10-CM

## 2011-05-01 DIAGNOSIS — D509 Iron deficiency anemia, unspecified: Secondary | ICD-10-CM

## 2011-05-01 DIAGNOSIS — R5381 Other malaise: Secondary | ICD-10-CM

## 2011-05-01 LAB — RETICULOCYTES (CHCC)
ABS Retic: 57.9 10*3/uL (ref 19.0–186.0)
RBC.: 3.86 MIL/uL — ABNORMAL LOW (ref 3.87–5.11)
Retic Ct Pct: 1.5 % (ref 0.4–2.3)

## 2011-05-01 LAB — CBC WITH DIFFERENTIAL (CANCER CENTER ONLY)
BASO#: 0 10*3/uL (ref 0.0–0.2)
BASO%: 0.2 % (ref 0.0–2.0)
EOS%: 5 % (ref 0.0–7.0)
Eosinophils Absolute: 0.2 10*3/uL (ref 0.0–0.5)
HCT: 36.7 % (ref 34.8–46.6)
HGB: 12.6 g/dL (ref 11.6–15.9)
LYMPH#: 1.2 10*3/uL (ref 0.9–3.3)
LYMPH%: 27.6 % (ref 14.0–48.0)
MCH: 31.3 pg (ref 26.0–34.0)
MCHC: 34.3 g/dL (ref 32.0–36.0)
MCV: 91 fL (ref 81–101)
MONO#: 0.3 10*3/uL (ref 0.1–0.9)
MONO%: 7.3 % (ref 0.0–13.0)
NEUT#: 2.5 10*3/uL (ref 1.5–6.5)
NEUT%: 59.9 % (ref 39.6–80.0)
Platelets: 150 10*3/uL (ref 145–400)
RBC: 4.02 10*6/uL (ref 3.70–5.32)
RDW: 14.9 % (ref 11.1–15.7)
WBC: 4.2 10*3/uL (ref 3.9–10.0)

## 2011-05-01 LAB — IRON AND TIBC
%SAT: 27 % (ref 20–55)
Iron: 87 ug/dL (ref 42–145)
TIBC: 318 ug/dL (ref 250–470)
UIBC: 231 ug/dL (ref 125–400)

## 2011-05-01 LAB — FERRITIN: Ferritin: 106 ng/mL (ref 10–291)

## 2011-05-01 LAB — VITAMIN B12: Vitamin B-12: 158 pg/mL — ABNORMAL LOW (ref 211–911)

## 2011-05-01 LAB — CHCC SATELLITE - SMEAR

## 2011-05-01 NOTE — Progress Notes (Signed)
This office note has been dictated.

## 2011-05-02 NOTE — Progress Notes (Signed)
CC:   Megan [name], [name] Megan [name], [name] Megan [name], [name] Megan Hawks Elnoria Howard, MD Megan [name], [name]  DIAGNOSIS:  Recurrent iron deficiency anemia.  CURRENT THERAPY:  IV iron as indicated.  INTERVAL HISTORY:  Megan [name] comes in for followup.  She did undergo her back surgery.  This was in January.  She had a little bit of a postop infection.  She was hospitalized for a little bit longer than she would have liked but got over it nicely.  She is feeling a whole lot better with much less pain.  We last gave her iron back a year ago in May.  She has had no obvious bleeding problems since.  She last had iron studies back in December which showed a ferritin of 184 with iron saturation of 27%.  Her appetite is okay.  She does feel a little bit tired.  She has had no rashes.  She has had no cough.  She has had no dyspnea.  Her weight has been holding relatively steady.  PHYSICAL EXAMINATION:  This is a an obese white female in no obvious distress.  Vital signs:  97.2, pulse 53, respiratory rate 18, blood pressure 100/68.  Weight is 262.  Head and neck exam shows a normocephalic, atraumatic skull.  There are no ocular or oral lesions. There are no palpable cervical or supraclavicular lymph nodes.  Lungs: Clear bilaterally.  Cardiac:  Regular rhythm with a normal S1 and S2. There are no murmurs, rubs, or bruits.  Abdomen:  Soft with good bowel sounds.  There is no palpable abdominal mass.  She has a well-healed cholecystectomy scar.  Extremities:  No clubbing, cyanosis or edema. Back:  Shows the  lumbar laminectomy site, which is well-healed.  LABORATORY STUDIES:  White cell count 4.2, hemoglobin 12.6, hematocrit 36.7, platelet count is 150.  MCV is 91.  IMPRESSION:  Megan [name] is a 46 year old white female with recurrent iron deficiency anemia.  Again, we have not given her iron since May of last year.  I am not sure as to why she does feel tired. She does take  quite a few medications.  She does have hypothyroidism so it is possible her thyroid may need to be readjusted.  I think we will plan to get her back to see me in another 2 or 3 months for followup.  I think this would be reasonable.    ______________________________ [name], [name] PRE/MEDQ  D:  05/01/2011  T:  05/02/2011  Job:  [id number]

## 2011-05-09 ENCOUNTER — Telehealth: Payer: Self-pay | Admitting: *Deleted

## 2011-05-09 NOTE — Telephone Encounter (Addendum)
Message copied by Mirian Capuchin on Fri May 09, 2011 12:13 PM ------      Message from: Arlan Organ R      Created: Mon May 05, 2011  6:53 PM       Call - B12 is low!!  Is she taking B12 by mouth or getting B12 injections???  [employer] patient on cell and left message, to let her know lab levels she is to call us back on how she is taking this med. ST

## 2011-05-14 ENCOUNTER — Telehealth: Payer: Self-pay | Admitting: *Deleted

## 2011-05-14 NOTE — Telephone Encounter (Signed)
Called patient on April 29th to let her know that her Vitamin B12 level is low per dr. Myna Hidalgo.  Asked patient if she is on Vitamin B12 pills and patient stated she was taking them but stopped in February because she felt they weren't helping.  Dr. Myna Hidalgo states that we could start giving her injections.  Called patent today and left a message that she could get Vitamin B12 injections per dr. Lupita Leash.  Told patient to call us back.

## 2011-05-14 NOTE — Telephone Encounter (Signed)
Message copied by Anselm Jungling on Wed May 14, 2011 12:40 PM ------      Message from: Arlan Organ R      Created: Mon May 05, 2011  6:53 PM       Call - B12 is low!!  Is she taking B12 by mouth or getting B12 injections???  [employer]

## 2011-05-20 ENCOUNTER — Other Ambulatory Visit: Payer: Self-pay | Admitting: Family

## 2011-05-21 NOTE — Telephone Encounter (Signed)
Done

## 2011-06-08 ENCOUNTER — Emergency Department (HOSPITAL_BASED_OUTPATIENT_CLINIC_OR_DEPARTMENT_OTHER)
Admission: EM | Admit: 2011-06-08 | Discharge: 2011-06-08 | Disposition: A | Discharge status: Home / Self Care | Payer: PRIVATE HEALTH INSURANCE | Attending: Emergency Medicine | Admitting: Emergency Medicine

## 2011-06-08 ENCOUNTER — Encounter (HOSPITAL_BASED_OUTPATIENT_CLINIC_OR_DEPARTMENT_OTHER): Payer: Self-pay | Admitting: Emergency Medicine

## 2011-06-08 DIAGNOSIS — D509 Iron deficiency anemia, unspecified: Secondary | ICD-10-CM | POA: Insufficient documentation

## 2011-06-08 DIAGNOSIS — F17200 Nicotine dependence, unspecified, uncomplicated: Secondary | ICD-10-CM | POA: Insufficient documentation

## 2011-06-08 DIAGNOSIS — Z8614 Personal history of Methicillin resistant Staphylococcus aureus infection: Secondary | ICD-10-CM | POA: Insufficient documentation

## 2011-06-08 DIAGNOSIS — E1149 Type 2 diabetes mellitus with other diabetic neurological complication: Secondary | ICD-10-CM | POA: Insufficient documentation

## 2011-06-08 DIAGNOSIS — K3184 Gastroparesis: Secondary | ICD-10-CM | POA: Insufficient documentation

## 2011-06-08 DIAGNOSIS — K047 Periapical abscess without sinus: Secondary | ICD-10-CM

## 2011-06-08 DIAGNOSIS — E785 Hyperlipidemia, unspecified: Secondary | ICD-10-CM | POA: Insufficient documentation

## 2011-06-08 DIAGNOSIS — E039 Hypothyroidism, unspecified: Secondary | ICD-10-CM | POA: Insufficient documentation

## 2011-06-08 DIAGNOSIS — M1990 Unspecified osteoarthritis, unspecified site: Secondary | ICD-10-CM | POA: Insufficient documentation

## 2011-06-08 DIAGNOSIS — I10 Essential (primary) hypertension: Secondary | ICD-10-CM | POA: Insufficient documentation

## 2011-06-08 MED ORDER — BUPIVACAINE-EPINEPHRINE PF 0.5-1:200000 % IJ SOLN
INTRAMUSCULAR | Status: AC
  Administered 2011-06-08: 9 mg
  Filled 2011-06-08: qty 1.8

## 2011-06-08 MED ORDER — CLINDAMYCIN HCL 300 MG PO CAPS: 300.0000 mg | ORAL_CAPSULE | Freq: Three times a day (TID) | ORAL | Status: AC

## 2011-06-08 MED ORDER — BUPIVACAINE-EPINEPHRINE PF 0.5-1:200000 % IJ SOLN
1.8000 mL | Freq: Once | INTRAMUSCULAR | Status: AC
  Administered 2011-06-08: 9 mg

## 2011-06-08 MED ORDER — CLINDAMYCIN HCL 150 MG PO CAPS
300.0000 mg | ORAL_CAPSULE | Freq: Once | ORAL | Status: AC
  Administered 2011-06-08: 300 mg via ORAL
  Filled 2011-06-08: qty 2

## 2011-06-08 MED ORDER — BUPIVACAINE-EPINEPHRINE 0.5% -1:200000 IJ SOLN
10.0000 mL | Freq: Once | INTRAMUSCULAR | Status: AC
  Administered 2011-06-08: 9 mg
  Filled 2011-06-08: qty 10

## 2011-06-08 NOTE — Discharge Instructions (Signed)
Abscessed Tooth A tooth abscess is a collection of infected fluid (pus) from a bacterial infection in the inner part of the tooth (pulp). It usually occurs at the end of the tooth's root.  CAUSES   A very bad cavity (extensive tooth decay).   Trauma to the tooth, such as a broken or chipped tooth, that allows bacteria to enter into the pulp.  SYMPTOMS  Severe pain in and around the infected tooth.   Swelling and redness around the abscessed tooth or in the mouth or face.   Tenderness.   Pus drainage.   Bad breath.   Bitter taste in the mouth.   Difficulty swallowing.   Difficulty opening the mouth.   Feeling sick to your stomach (nauseous).   Vomiting.   Chills.   Swollen neck glands.  DIAGNOSIS  A medical and dental history will be taken.   An examination will be performed by tapping on the abscessed tooth.   X-rays may be taken of the tooth to identify the abscess.  TREATMENT The goal of treatment is to eliminate the infection.   You may be prescribed antibiotic medicine to stop the infection from spreading.   A root canal may be performed to save the tooth. If the tooth cannot be saved, it may be pulled (extracted) and the abscess may be drained.  HOME CARE INSTRUCTIONS  Only take over-the-counter or prescription medicines for pain, fever, or discomfort as directed by your caregiver.   Do not drive after taking pain medicine (narcotics).   Rinse your mouth (gargle) often with salt water ( tsp salt in 8 oz of warm water) to relieve pain or swelling.   Do not apply heat to the outside of your face.   Return to your dentist for further treatment as directed.  SEEK IMMEDIATE DENTAL CARE IF:  You have a temperature by mouth above 102 F (38.9 C), not controlled by medicine.   You have chills or a very bad headache.   You have problems breathing or swallowing.   Your have trouble opening your mouth.   You develop swelling in the neck or around the eye.    Your pain is not helped by medicine.   Your pain is getting worse instead of better.  Document Released: 12/30/2004 Document Revised: 12/19/2010 Document Reviewed: 04/09/2010 [employer] Patient Information 2012 [employer]. 
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
## 2011-06-08 NOTE — ED Notes (Signed)
Pt c/o pain and swelling to LT lower jaw since last pm

## 2011-07-23 ENCOUNTER — Ambulatory Visit: Payer: PRIVATE HEALTH INSURANCE | Admitting: Hematology & Oncology

## 2011-07-23 ENCOUNTER — Other Ambulatory Visit (HOSPITAL_BASED_OUTPATIENT_CLINIC_OR_DEPARTMENT_OTHER): Payer: PRIVATE HEALTH INSURANCE | Admitting: Lab

## 2011-07-23 ENCOUNTER — Telehealth: Payer: Self-pay | Admitting: Hematology & Oncology

## 2011-07-23 NOTE — Telephone Encounter (Signed)
Pt stopped by the office at 2:30 and cx 07/23/11 apt and resch for 07/31/11

## 2011-07-31 ENCOUNTER — Ambulatory Visit (HOSPITAL_BASED_OUTPATIENT_CLINIC_OR_DEPARTMENT_OTHER): Payer: PRIVATE HEALTH INSURANCE | Admitting: Hematology & Oncology

## 2011-07-31 ENCOUNTER — Ambulatory Visit (HOSPITAL_BASED_OUTPATIENT_CLINIC_OR_DEPARTMENT_OTHER): Payer: PRIVATE HEALTH INSURANCE

## 2011-07-31 ENCOUNTER — Other Ambulatory Visit: Payer: PRIVATE HEALTH INSURANCE | Admitting: Lab

## 2011-07-31 ENCOUNTER — Encounter: Payer: Self-pay | Admitting: Hematology & Oncology

## 2011-07-31 VITALS — BP 110/74 | HR 66 | Temp 98.5°F | Ht 67.0 in | Wt 268.0 lb

## 2011-07-31 DIAGNOSIS — D510 Vitamin B12 deficiency anemia due to intrinsic factor deficiency: Secondary | ICD-10-CM

## 2011-07-31 DIAGNOSIS — D508 Other iron deficiency anemias: Secondary | ICD-10-CM

## 2011-07-31 DIAGNOSIS — D509 Iron deficiency anemia, unspecified: Secondary | ICD-10-CM

## 2011-07-31 DIAGNOSIS — N898 Other specified noninflammatory disorders of vagina: Secondary | ICD-10-CM

## 2011-07-31 HISTORY — DX: Vitamin B12 deficiency anemia due to intrinsic factor deficiency: D51.0

## 2011-07-31 LAB — IRON AND TIBC
%SAT: 22 % (ref 20–55)
Iron: 76 ug/dL (ref 42–145)
TIBC: 338 ug/dL (ref 250–470)
UIBC: 262 ug/dL (ref 125–400)

## 2011-07-31 LAB — CBC WITH DIFFERENTIAL (CANCER CENTER ONLY)
BASO#: 0 10*3/uL (ref 0.0–0.2)
BASO%: 0.6 % (ref 0.0–2.0)
EOS%: 4.3 % (ref 0.0–7.0)
Eosinophils Absolute: 0.2 10*3/uL (ref 0.0–0.5)
HCT: 35.8 % (ref 34.8–46.6)
HGB: 12.3 g/dL (ref 11.6–15.9)
LYMPH#: 1.3 10*3/uL (ref 0.9–3.3)
LYMPH%: 25 % (ref 14.0–48.0)
MCH: 31.3 pg (ref 26.0–34.0)
MCHC: 34.4 g/dL (ref 32.0–36.0)
MCV: 91 fL (ref 81–101)
MONO#: 0.5 10*3/uL (ref 0.1–0.9)
MONO%: 9.2 % (ref 0.0–13.0)
NEUT#: 3.1 10*3/uL (ref 1.5–6.5)
NEUT%: 60.9 % (ref 39.6–80.0)
Platelets: 157 10*3/uL (ref 145–400)
RBC: 3.93 10*6/uL (ref 3.70–5.32)
RDW: 13.5 % (ref 11.1–15.7)
WBC: 5.1 10*3/uL (ref 3.9–10.0)

## 2011-07-31 LAB — FERRITIN: Ferritin: 42 ng/mL (ref 10–291)

## 2011-07-31 MED ORDER — CYANOCOBALAMIN 1000 MCG/ML IJ SOLN
1000.0000 ug | Freq: Once | INTRAMUSCULAR | Status: AC
  Administered 2011-07-31: 1000 ug via INTRAMUSCULAR

## 2011-07-31 NOTE — Progress Notes (Signed)
This office note has been dictated.

## 2011-08-01 NOTE — Progress Notes (Signed)
CC:   [name], [name] [name], [name] [name], NP [name], MD [name], [name]  DIAGNOSES: 1. Recurrent iron deficiency anemia. 2. Pernicious anemia.  CURRENT THERAPY: 1. Vitamin B12 1 mg IM every month. 2. IV iron as indicated.  INTERIM HISTORY:  Megan [name] comes in for followup.  She is doing fairly well.  She had her back surgery back in January.  She is doing okay with this.  She unfortunately had a postop wound infection.  This did set her back a little bit, but now she is doing better.  She does feel tired. When we last saw her in April, her B12 was only 158.  As such, we are going to have to get her on B12.  When we saw her back in April, her ferritin was 106 with iron saturation of 27%.  There has been no bleeding.  She has had no cough or shortness of breath.  She is trying to watch her weight.  She is trying to stay active.  PHYSICAL EXAMINATION:  General; This is a well-developed, well-nourished white female in no obvious distress.  Vital signs:  Temperature 98.5, pulse 66, respiratory rate 18, blood pressure 110/74.  Weight is 268. Head and neck:  Normocephalic, atraumatic skull.  There are no ocular or oral lesions appeared.  There are no palpable cervical or supraclavicular lymph nodes.  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  Lungs:  Clear bilaterally.  Cardiac:  Regular rate and rhythm with a normal S1 and S2.  There are no murmurs, rubs or bruits. Abdomen:  Soft with good bowel sounds.  There is no palpable abdominal mass.  There is no fluid wave.  She has no palpable hepatosplenomegaly. Back:  Shows a laminectomy scar in the lumbar spine.  This is well healed.  Extremities:  Show no clubbing, cyanosis or edema. Neurological:  Shows no focal neurological deficits.  LABORATORY STUDIES:  White cell count 5.1, hemoglobin 12.3, hematocrit 35.8, platelet count is 157.  IMPRESSION:  Megan [name] is a 45 year old  white female with iron deficiency anemia and pernicious anemia.  She does have other health issues.  She sees Dr. Alanda Amass for her cardiac issues.  She does have the weight problem but she is working on this.  Her back, thankfully, is doing much better since she had her surgery.  We will see what her iron studies show.  We will get her on B12 monthly now.  I want to see her back in 3 months' time for followup.   I did forget to mention that she states that she is now having some vaginal bleeding.  She says she has not had a cycle in over a year.  I told her that she really needs to have this evaluated by Dr. [name] or [name].  She will make sure she gets an appointment.    ______________________________ [name], [name] PRE/MEDQ  D:  07/31/2011  T:  08/01/2011  Job:  [id number]

## 2011-08-05 ENCOUNTER — Telehealth: Payer: Self-pay | Admitting: *Deleted

## 2011-08-05 NOTE — Telephone Encounter (Addendum)
Message copied by Mirian Capuchin on Tue Aug 05, 2011 10:07 AM ------      Message from: Arlan Organ R      Created: Mon Aug 04, 2011  6:30 PM       Please call and tell her that her iron is low. She definitely needs Feraheme at 1020 mg. Please set this up at her convenience. Thanks. Cindee Lame This message given to pt and she set up a time and date for tx.

## 2011-08-12 ENCOUNTER — Other Ambulatory Visit: Payer: Self-pay | Admitting: Family

## 2011-08-12 ENCOUNTER — Ambulatory Visit (HOSPITAL_BASED_OUTPATIENT_CLINIC_OR_DEPARTMENT_OTHER): Payer: PRIVATE HEALTH INSURANCE

## 2011-08-12 VITALS — BP 109/71 | HR 51 | Temp 97.2°F

## 2011-08-12 DIAGNOSIS — D649 Anemia, unspecified: Secondary | ICD-10-CM

## 2011-08-12 DIAGNOSIS — D509 Iron deficiency anemia, unspecified: Secondary | ICD-10-CM

## 2011-08-12 MED ORDER — SODIUM CHLORIDE 0.9 % IV SOLN
INTRAVENOUS | Status: DC
  Administered 2011-08-12: 10:00:00 via INTRAVENOUS

## 2011-08-12 MED ORDER — SODIUM CHLORIDE 0.9 % IV SOLN
1020.0000 mg | Freq: Once | INTRAVENOUS | Status: AC
  Administered 2011-08-12: 1020 mg via INTRAVENOUS
  Filled 2011-08-12: qty 34

## 2011-08-12 NOTE — Patient Instructions (Signed)
Ferumoxytol injection What is this medicine? FERUMOXYTOL is an iron complex. Iron is used to make healthy red blood cells, which carry oxygen and nutrients throughout the body. This medicine is used to treat iron deficiency anemia in people with chronic kidney disease. This medicine may be used for other purposes; ask your health care provider or pharmacist if you have questions. What should I tell my health care provider before I take this medicine? They need to know if you have any of these conditions: -anemia not caused by low iron levels -high levels of iron in the blood -magnetic resonance imaging (MRI) test scheduled -an unusual or allergic reaction to iron, other medicines, foods, dyes, or preservatives -pregnant or trying to get pregnant -breast-feeding How should I use this medicine? This medicine is for infusion into a vein. It is given by a health care professional in a hospital or clinic setting. Talk to your pediatrician regarding the use of this medicine in children. Special care may be needed. Overdosage: If you think you've taken too much of this medicine contact a poison control center or emergency room at once. Overdosage: If you think you have taken too much of this medicine contact a poison control center or emergency room at once. NOTE: This medicine is only for you. Do not share this medicine with others. What if I miss a dose? It is important not to miss your dose. Call your doctor or health care professional if you are unable to keep an appointment. What may interact with this medicine? This medicine may interact with the following medications: -other iron products This list may not describe all possible interactions. Give your health care provider a list of all the medicines, herbs, non-prescription drugs, or dietary supplements you use. Also tell them if you smoke, drink alcohol, or use illegal drugs. Some items may interact with your medicine. What should I watch  for while using this medicine? Visit your doctor or healthcare professional regularly. Tell your doctor or healthcare professional if your symptoms do not start to get better or if they get worse. You may need blood work done while you are taking this medicine. You may need to follow a special diet. Talk to your doctor. Foods that contain iron include: whole grains/cereals, dried fruits, beans, or peas, leafy green vegetables, and organ meats (liver, kidney). What side effects may I notice from receiving this medicine? Side effects that you should report to your doctor or health care professional as soon as possible: -allergic reactions like skin rash, itching or hives, swelling of the face, lips, or tongue -breathing problems -changes in blood pressure -feeling faint or lightheaded, falls -fever or chills -flushing, sweating, or hot feelings -swelling of the ankles or feet Side effects that usually do not require medical attention (Report these to your doctor or health care professional if they continue or are bothersome.): -diarrhea -headache -nausea, vomiting -stomach pain This list may not describe all possible side effects. Call your doctor for medical advice about side effects. You may report side effects to FDA at 1-800-FDA-1088. Where should I keep my medicine? This drug is given in a hospital or clinic and will not be stored at home. NOTE: This sheet is a summary. It may not cover all possible information. If you have questions about this medicine, talk to your doctor, pharmacist, or health care provider.  2012, Elsevier/Gold Standard. (09/22/2007 9:48:25 PM) 

## 2011-08-12 NOTE — Telephone Encounter (Signed)
30 Day supply of synthroid sent to pharmacy. Pt needs appt. For futher refills. Please call pt to arrange follow up.

## 2011-08-14 ENCOUNTER — Ambulatory Visit: Payer: PRIVATE HEALTH INSURANCE

## 2011-09-01 ENCOUNTER — Encounter: Payer: Self-pay | Admitting: Family

## 2011-09-01 ENCOUNTER — Ambulatory Visit (INDEPENDENT_AMBULATORY_CARE_PROVIDER_SITE_OTHER): Payer: PRIVATE HEALTH INSURANCE | Admitting: Family

## 2011-09-01 VITALS — BP 92/64 | HR 55 | Temp 97.9°F | Resp 16 | Ht 67.0 in | Wt 269.2 lb

## 2011-09-01 DIAGNOSIS — N926 Irregular menstruation, unspecified: Secondary | ICD-10-CM

## 2011-09-01 DIAGNOSIS — E119 Type 2 diabetes mellitus without complications: Secondary | ICD-10-CM

## 2011-09-01 DIAGNOSIS — E039 Hypothyroidism, unspecified: Secondary | ICD-10-CM

## 2011-09-01 LAB — CBC WITH DIFFERENTIAL/PLATELET
Basophils Absolute: 0 10*3/uL (ref 0.0–0.1)
Basophils Relative: 1 % (ref 0–1)
Eosinophils Absolute: 0.2 10*3/uL (ref 0.0–0.7)
Eosinophils Relative: 5 % (ref 0–5)
HCT: 33.3 % — ABNORMAL LOW (ref 36.0–46.0)
Hemoglobin: 11.5 g/dL — ABNORMAL LOW (ref 12.0–15.0)
Lymphocytes Relative: 26 % (ref 12–46)
Lymphs Abs: 1 10*3/uL (ref 0.7–4.0)
MCH: 30.5 pg (ref 26.0–34.0)
MCHC: 34.5 g/dL (ref 30.0–36.0)
MCV: 88.3 fL (ref 78.0–100.0)
Monocytes Absolute: 0.3 10*3/uL (ref 0.1–1.0)
Monocytes Relative: 9 % (ref 3–12)
Neutro Abs: 2.2 10*3/uL (ref 1.7–7.7)
Neutrophils Relative %: 59 % (ref 43–77)
Platelets: 175 10*3/uL (ref 150–400)
RBC: 3.77 MIL/uL — ABNORMAL LOW (ref 3.87–5.11)
RDW: 14 % (ref 11.5–15.5)
WBC: 3.8 10*3/uL — ABNORMAL LOW (ref 4.0–10.5)

## 2011-09-01 LAB — HEMOGLOBIN A1C
Hgb A1c MFr Bld: 5.4 % (ref <5.7)
Mean Plasma Glucose: 108 mg/dL (ref <117)

## 2011-09-01 NOTE — Patient Instructions (Addendum)
Please complete your lab work prior to leaving.  You will be contact about your referral to GYN. Please let us know if you have not heard back within 1 week about your referral.
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
## 2011-09-02 LAB — MICROALBUMIN / CREATININE URINE RATIO
Creatinine, Urine: 229.3 mg/dL
Microalb Creat Ratio: 4 mg/g (ref 0.0–30.0)
Microalb, Ur: 0.92 mg/dL (ref 0.00–1.89)

## 2011-09-02 LAB — TSH: TSH: 3.618 u[IU]/mL (ref 0.350–4.500)

## 2011-09-03 DIAGNOSIS — N926 Irregular menstruation, unspecified: Secondary | ICD-10-CM | POA: Insufficient documentation

## 2011-09-03 LAB — FOLLICLE STIMULATING HORMONE: FSH: 10.9 m[IU]/mL

## 2011-09-03 LAB — LUTEINIZING HORMONE: LH: 5.2 m[IU]/mL

## 2011-09-03 LAB — ESTRADIOL: Estradiol: 52.6 pg/mL

## 2011-09-03 NOTE — Assessment & Plan Note (Signed)
Suspect that pt is perimenopausal. We discussed that irregular menses are normal in the perimenopausal period. Will obtain hormone tests.

## 2011-09-03 NOTE — Assessment & Plan Note (Signed)
Will obtain TSH.  I told pt that I am comfortable monitoring TSH in our office. Consider referral to endo if we have trouble stabilizing tsh.

## 2011-09-05 ENCOUNTER — Other Ambulatory Visit (HOSPITAL_BASED_OUTPATIENT_CLINIC_OR_DEPARTMENT_OTHER): Payer: PRIVATE HEALTH INSURANCE | Admitting: Lab

## 2011-09-05 ENCOUNTER — Other Ambulatory Visit: Payer: Self-pay | Admitting: *Deleted

## 2011-09-05 ENCOUNTER — Ambulatory Visit (HOSPITAL_BASED_OUTPATIENT_CLINIC_OR_DEPARTMENT_OTHER): Payer: PRIVATE HEALTH INSURANCE

## 2011-09-05 VITALS — BP 106/71 | HR 52 | Temp 97.7°F | Resp 18

## 2011-09-05 DIAGNOSIS — D508 Other iron deficiency anemias: Secondary | ICD-10-CM

## 2011-09-05 DIAGNOSIS — D510 Vitamin B12 deficiency anemia due to intrinsic factor deficiency: Secondary | ICD-10-CM

## 2011-09-05 LAB — IRON AND TIBC
%SAT: 23 % (ref 20–55)
Iron: 61 ug/dL (ref 42–145)
TIBC: 268 ug/dL (ref 250–470)
UIBC: 207 ug/dL (ref 125–400)

## 2011-09-05 LAB — FERRITIN: Ferritin: 124 ng/mL (ref 10–291)

## 2011-09-05 MED ORDER — CYANOCOBALAMIN 1000 MCG/ML IJ SOLN
1000.0000 ug | Freq: Once | INTRAMUSCULAR | Status: AC
  Administered 2011-09-05: 1000 ug via INTRAMUSCULAR

## 2011-09-05 NOTE — Progress Notes (Signed)
Notified pt with md response... 09/05/11@11 :11am/LMB

## 2011-09-05 NOTE — Patient Instructions (Signed)

## 2011-09-11 ENCOUNTER — Encounter: Payer: Self-pay | Admitting: *Deleted

## 2011-09-18 ENCOUNTER — Ambulatory Visit: Payer: PRIVATE HEALTH INSURANCE

## 2011-09-19 ENCOUNTER — Telehealth: Payer: Self-pay | Admitting: Family

## 2011-09-19 MED ORDER — LEVOTHYROXINE SODIUM 175 MCG PO TABS: 175.0000 ug | ORAL_TABLET | Freq: Every day | ORAL | Status: DC

## 2011-09-19 NOTE — Telephone Encounter (Signed)
Refill sent #30 x 2 refills. 

## 2011-09-19 NOTE — Telephone Encounter (Signed)
Refill-synthroid tablet. Take one tablet every morning.

## 2011-09-19 NOTE — Telephone Encounter (Signed)
Take one tablet every morning on empty stomach. Qty 30 last fill 7.30.13

## 2011-10-03 ENCOUNTER — Ambulatory Visit (HOSPITAL_BASED_OUTPATIENT_CLINIC_OR_DEPARTMENT_OTHER)
Admission: RE | Admit: 2011-10-03 | Discharge: 2011-10-03 | Disposition: A | Discharge status: Home / Self Care | Payer: PRIVATE HEALTH INSURANCE | Source: Ambulatory Visit | Attending: Family | Admitting: Family

## 2011-10-03 ENCOUNTER — Encounter: Payer: Self-pay | Admitting: Family

## 2011-10-03 ENCOUNTER — Ambulatory Visit (INDEPENDENT_AMBULATORY_CARE_PROVIDER_SITE_OTHER): Payer: PRIVATE HEALTH INSURANCE | Admitting: Family

## 2011-10-03 VITALS — BP 102/70 | HR 53 | Temp 98.4°F | Resp 16 | Wt 266.1 lb

## 2011-10-03 DIAGNOSIS — M545 Low back pain, unspecified: Secondary | ICD-10-CM

## 2011-10-03 DIAGNOSIS — Z23 Encounter for immunization: Secondary | ICD-10-CM

## 2011-10-03 DIAGNOSIS — K754 Autoimmune hepatitis: Secondary | ICD-10-CM

## 2011-10-03 LAB — CBC WITH DIFFERENTIAL/PLATELET
Basophils Absolute: 0 10*3/uL (ref 0.0–0.1)
Basophils Relative: 1 % (ref 0–1)
Eosinophils Absolute: 0.2 10*3/uL (ref 0.0–0.7)
Eosinophils Relative: 3 % (ref 0–5)
HCT: 36.4 % (ref 36.0–46.0)
Hemoglobin: 12.7 g/dL (ref 12.0–15.0)
Lymphocytes Relative: 31 % (ref 12–46)
Lymphs Abs: 1.9 10*3/uL (ref 0.7–4.0)
MCH: 30 pg (ref 26.0–34.0)
MCHC: 34.9 g/dL (ref 30.0–36.0)
MCV: 86.1 fL (ref 78.0–100.0)
Monocytes Absolute: 0.5 10*3/uL (ref 0.1–1.0)
Monocytes Relative: 9 % (ref 3–12)
Neutro Abs: 3.6 10*3/uL (ref 1.7–7.7)
Neutrophils Relative %: 56 % (ref 43–77)
Platelets: 198 10*3/uL (ref 150–400)
RBC: 4.23 MIL/uL (ref 3.87–5.11)
RDW: 13.2 % (ref 11.5–15.5)
WBC: 6.2 10*3/uL (ref 4.0–10.5)

## 2011-10-03 LAB — POCT URINALYSIS DIPSTICK
Bilirubin, UA: NEGATIVE
Glucose, UA: NEGATIVE
Ketones, UA: NEGATIVE
Leukocytes, UA: NEGATIVE
Nitrite, UA: NEGATIVE
Protein, UA: NEGATIVE
Spec Grav, UA: 1.01
Urobilinogen, UA: 0.2
pH, UA: 6

## 2011-10-03 LAB — HEPATIC FUNCTION PANEL
ALT: 26 U/L (ref 0–35)
AST: 35 U/L (ref 0–37)
Albumin: 3.5 g/dL (ref 3.5–5.2)
Alkaline Phosphatase: 74 U/L (ref 39–117)
Bilirubin, Direct: 0.1 mg/dL (ref 0.0–0.3)
Indirect Bilirubin: 0.2 mg/dL (ref 0.0–0.9)
Total Bilirubin: 0.3 mg/dL (ref 0.3–1.2)
Total Protein: 6.4 g/dL (ref 6.0–8.3)

## 2011-10-03 MED ORDER — CIPROFLOXACIN HCL 500 MG PO TABS: 500.0000 mg | ORAL_TABLET | Freq: Two times a day (BID) | ORAL | Status: DC

## 2011-10-03 MED ORDER — METHYLPREDNISOLONE (PAK) 4 MG PO TABS: ORAL_TABLET | ORAL | Status: DC
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
## 2011-10-03 NOTE — Patient Instructions (Addendum)
Please complete your x-ray on the first floor. Please complete your blood work prior to leaving.  Start cipro. Start medrol dose pak. Follow up in 2 weeks.

## 2011-10-06 LAB — URINE CULTURE: Colony Count: 15000

## 2011-10-07 DIAGNOSIS — M545 Low back pain, unspecified: Secondary | ICD-10-CM | POA: Insufficient documentation

## 2011-10-07 NOTE — Assessment & Plan Note (Addendum)
Pt with hx of lumbar fusion. Obtained plain film which shows post surgical changes- not acute changes. UA notes trace blood.  Pt empirically treated with cipro.  Urine culture grew 15 K ecoli.  Pt given rx for medrol dose pak for back pain.

## 2011-10-14 HISTORY — PX: MOUTH SURGERY: SHX715

## 2011-10-16 ENCOUNTER — Ambulatory Visit: Payer: PRIVATE HEALTH INSURANCE

## 2011-10-16 ENCOUNTER — Ambulatory Visit (HOSPITAL_BASED_OUTPATIENT_CLINIC_OR_DEPARTMENT_OTHER): Payer: PRIVATE HEALTH INSURANCE | Admitting: Hematology & Oncology

## 2011-10-16 ENCOUNTER — Other Ambulatory Visit (HOSPITAL_BASED_OUTPATIENT_CLINIC_OR_DEPARTMENT_OTHER): Payer: PRIVATE HEALTH INSURANCE | Admitting: Lab

## 2011-10-16 VITALS — BP 99/52 | HR 60 | Temp 98.2°F | Resp 18 | Ht 67.0 in | Wt 269.0 lb

## 2011-10-16 DIAGNOSIS — D509 Iron deficiency anemia, unspecified: Secondary | ICD-10-CM

## 2011-10-16 DIAGNOSIS — D510 Vitamin B12 deficiency anemia due to intrinsic factor deficiency: Secondary | ICD-10-CM

## 2011-10-16 LAB — CBC WITH DIFFERENTIAL (CANCER CENTER ONLY)
BASO#: 0 10*3/uL (ref 0.0–0.2)
BASO%: 0.3 % (ref 0.0–2.0)
EOS%: 4.3 % (ref 0.0–7.0)
Eosinophils Absolute: 0.3 10*3/uL (ref 0.0–0.5)
HCT: 37 % (ref 34.8–46.6)
HGB: 12.6 g/dL (ref 11.6–15.9)
LYMPH#: 1.4 10*3/uL (ref 0.9–3.3)
LYMPH%: 23 % (ref 14.0–48.0)
MCH: 30.5 pg (ref 26.0–34.0)
MCHC: 34.1 g/dL (ref 32.0–36.0)
MCV: 90 fL (ref 81–101)
MONO#: 0.4 10*3/uL (ref 0.1–0.9)
MONO%: 6.5 % (ref 0.0–13.0)
NEUT#: 4 10*3/uL (ref 1.5–6.5)
NEUT%: 65.9 % (ref 39.6–80.0)
Platelets: 159 10*3/uL (ref 145–400)
RBC: 4.13 10*6/uL (ref 3.70–5.32)
RDW: 12.6 % (ref 11.1–15.7)
WBC: 6 10*3/uL (ref 3.9–10.0)

## 2011-10-16 LAB — IRON AND TIBC
%SAT: 20 % (ref 20–55)
Iron: 62 ug/dL (ref 42–145)
TIBC: 307 ug/dL (ref 250–470)
UIBC: 245 ug/dL (ref 125–400)

## 2011-10-16 LAB — CHCC SATELLITE - SMEAR

## 2011-10-16 LAB — FERRITIN: Ferritin: 46 ng/mL (ref 10–291)

## 2011-10-16 LAB — VITAMIN B12: Vitamin B-12: 276 pg/mL (ref 211–911)

## 2011-10-16 NOTE — Progress Notes (Signed)
Due to prior appointment, patient is going to reschedule doctor's appointment and injection appointment. Megan [name], [name]

## 2011-10-16 NOTE — Progress Notes (Signed)
She had to leave early today so she will re-schedule her app for 2 weeks.  Her blood work looks ok.  I will see what her iron levels are.  She will need B12 injection with next appt.  I WILL NOT CHARGE her as I did NOT see her!!!  I did call her on the phone and apologized for not seeing her.  As always, she was very kind and understanding!!!  Cindee Lame E

## 2011-10-17 ENCOUNTER — Encounter: Payer: Self-pay | Admitting: Family

## 2011-10-17 ENCOUNTER — Ambulatory Visit (INDEPENDENT_AMBULATORY_CARE_PROVIDER_SITE_OTHER): Payer: PRIVATE HEALTH INSURANCE | Admitting: Family

## 2011-10-17 ENCOUNTER — Telehealth: Payer: Self-pay | Admitting: Hematology & Oncology

## 2011-10-17 VITALS — BP 118/84 | HR 58 | Temp 98.9°F | Resp 16 | Wt 267.0 lb

## 2011-10-17 DIAGNOSIS — M539 Dorsopathy, unspecified: Secondary | ICD-10-CM

## 2011-10-17 NOTE — Progress Notes (Signed)
  Subjective:    Patient ID: Megan [name], female    DOB: 27-Mar-1965, 46 y.o.   MRN: [id number]  HPI  Megan [name] is a 46 yr old female who presents today for follow up.  Last visit she was treated for acute low back pain with a medrol dose pak. She reports resolution of her low back pain.    Review of Systems See HPI    Objective:   Physical Exam  Constitutional: She appears well-developed and well-nourished. No distress.  Cardiovascular: Normal rate and regular rhythm.   No murmur heard. Pulmonary/Chest: Effort normal and breath sounds normal. No respiratory distress. She has no wheezes. She has no rales. She exhibits no tenderness.  Musculoskeletal:       Thoracic back: She exhibits no tenderness.       Lumbar back: She exhibits no tenderness.  Neurological:       Bilateral LE strength is 5/5  Psychiatric: She has a normal mood and affect. Her behavior is normal. Judgment and thought content normal.          Assessment & Plan:

## 2011-10-17 NOTE — Patient Instructions (Addendum)
Please schedule a follow up appointment in 3 months.

## 2011-10-17 NOTE — Assessment & Plan Note (Signed)
Back pain is resolved. Monitor.

## 2011-10-17 NOTE — Telephone Encounter (Signed)
Called pt with 10-15 appointment no answer or voice mail

## 2011-10-20 ENCOUNTER — Telehealth: Payer: Self-pay | Admitting: Hematology & Oncology

## 2011-10-20 NOTE — Telephone Encounter (Signed)
Left pt message with 10-15 appointment

## 2011-10-27 ENCOUNTER — Other Ambulatory Visit: Payer: Self-pay | Admitting: *Deleted

## 2011-10-27 DIAGNOSIS — D508 Other iron deficiency anemias: Secondary | ICD-10-CM

## 2011-10-28 ENCOUNTER — Ambulatory Visit: Payer: PRIVATE HEALTH INSURANCE | Admitting: Hematology & Oncology

## 2011-10-28 ENCOUNTER — Telehealth: Payer: Self-pay | Admitting: *Deleted

## 2011-10-28 ENCOUNTER — Ambulatory Visit (HOSPITAL_BASED_OUTPATIENT_CLINIC_OR_DEPARTMENT_OTHER): Payer: PRIVATE HEALTH INSURANCE | Admitting: Hematology & Oncology

## 2011-10-28 ENCOUNTER — Ambulatory Visit (HOSPITAL_BASED_OUTPATIENT_CLINIC_OR_DEPARTMENT_OTHER): Payer: PRIVATE HEALTH INSURANCE

## 2011-10-28 ENCOUNTER — Ambulatory Visit: Payer: PRIVATE HEALTH INSURANCE

## 2011-10-28 VITALS — BP 99/52 | HR 51 | Temp 98.1°F | Resp 18 | Ht 67.0 in | Wt 277.0 lb

## 2011-10-28 DIAGNOSIS — D509 Iron deficiency anemia, unspecified: Secondary | ICD-10-CM

## 2011-10-28 DIAGNOSIS — D508 Other iron deficiency anemias: Secondary | ICD-10-CM

## 2011-10-28 DIAGNOSIS — D510 Vitamin B12 deficiency anemia due to intrinsic factor deficiency: Secondary | ICD-10-CM

## 2011-10-28 MED ORDER — CYANOCOBALAMIN 1000 MCG/ML IJ SOLN
1000.0000 ug | Freq: Once | INTRAMUSCULAR | Status: AC
  Administered 2011-10-28: 1000 ug via INTRAMUSCULAR

## 2011-10-28 NOTE — Telephone Encounter (Addendum)
Message copied by Mirian Capuchin on Tue Oct 28, 2011 10:10 AM ------      Message from: [name]      Created: Mon Oct 20, 2011  7:25 AM       Call - iron is low again.  Need Feraheme at 1020mg  x 1 dose.  Plesase set up.  Thanks!!  Pete Pt has set up appt for [employer] today.

## 2011-10-28 NOTE — Progress Notes (Signed)
This office note has been dictated.

## 2011-10-29 ENCOUNTER — Telehealth: Payer: Self-pay | Admitting: Hematology & Oncology

## 2011-10-29 NOTE — Progress Notes (Signed)
CC:   [name], [name] [name], [name] [name], NP [name], [name] [name] Elnoria Howard, MD  DIAGNOSES: 1. Recurrent iron-deficiency anemia. 2. Pernicious anemia.  CURRENT THERAPY: 1. IV iron as indicated. 2. Vitamin B12 1 mg IM each month.  INTERIM HISTORY:  Ms. Bossler comes in for follow-up.  She is doing okay. Her back is bothering her a little bit more.  This is more so on the left side.  She is going to be seeing Dr. Lovell Sheehan again for this.  She has had no obvious bleeding.  There have been no rashes.  Her weight is actually up a little bit.  I think she is on some steroid because of the back issue.  When we last saw her, her ferritin was 124 with an iron saturation of 23%.  She has had no cough.  She has had no nausea or vomiting.  She has had no change in medications.  PHYSICAL EXAMINATION:  General:  This is an obese white female in no obvious distress.  Vital Signs:  Temperature of 98.1, pulse 51, respiratory rate 18, blood pressure 99/52.  Weight is 277.  Head and Neck:  Normocephalic, atraumatic skull.  There are no ocular or oral lesions.  There are no palpable cervical or supraclavicular lymph nodes. Lungs:  Clear bilaterally.  Cardiac:  Regular rate and rhythm with a normal S1 and S2.  There are no murmurs, rubs, or bruits.  Abdomen: Soft with good bowel sounds.  There is no palpable abdominal mass. There is no palpable hepatosplenomegaly.  Back:  Laminectomy scar in the lumbar spine.  Extremities:  No clubbing, cyanosis, or edema.  LABORATORY STUDIES:  White cell count is 6, hemoglobin 12.6, hematocrit 37, platelet count 159.  B12 is 276.  Ferritin is 46 with an iron saturation of 20%.  IMPRESSION:  Ms. Lyvers is a 46 year old white female with pernicious anemia and iron-deficiency anemia.  She has dropping her iron again.  We will set her up with IV iron this week with Feraheme.  She will get her B12 shot today.  We  will make sure she comes back every month for her B12.  I will plan to see her back probably in a couple months.    ______________________________ [name], [name] PRE/MEDQ  D:  10/28/2011  T:  10/29/2011  Job:  [id number]

## 2011-10-29 NOTE — Telephone Encounter (Signed)
Pt aware of 10-17 iron infusion

## 2011-10-30 ENCOUNTER — Ambulatory Visit: Payer: PRIVATE HEALTH INSURANCE

## 2011-10-30 ENCOUNTER — Ambulatory Visit (HOSPITAL_BASED_OUTPATIENT_CLINIC_OR_DEPARTMENT_OTHER): Payer: PRIVATE HEALTH INSURANCE

## 2011-10-30 VITALS — BP 117/72 | Temp 97.9°F | Resp 20

## 2011-10-30 DIAGNOSIS — D509 Iron deficiency anemia, unspecified: Secondary | ICD-10-CM

## 2011-10-30 DIAGNOSIS — D508 Other iron deficiency anemias: Secondary | ICD-10-CM

## 2011-10-30 MED ORDER — FERUMOXYTOL INJECTION 510 MG/17 ML
1020.0000 mg | Freq: Once | INTRAVENOUS | Status: AC
  Administered 2011-10-30: 1020 mg via INTRAVENOUS
  Filled 2011-10-30: qty 34

## 2011-10-30 MED ORDER — SODIUM CHLORIDE 0.9 % IV SOLN
INTRAVENOUS | Status: DC
  Administered 2011-10-30: 14:00:00 via INTRAVENOUS

## 2011-10-30 NOTE — Patient Instructions (Signed)
Ferumoxytol injection What is this medicine? FERUMOXYTOL is an iron complex. Iron is used to make healthy red blood cells, which carry oxygen and nutrients throughout the body. This medicine is used to treat iron deficiency anemia in people with chronic kidney disease. This medicine may be used for other purposes; ask your health care provider or pharmacist if you have questions. What should I tell my health care provider before I take this medicine? They need to know if you have any of these conditions: -anemia not caused by low iron levels -high levels of iron in the blood -magnetic resonance imaging (MRI) test scheduled -an unusual or allergic reaction to iron, other medicines, foods, dyes, or preservatives -pregnant or trying to get pregnant -breast-feeding How should I use this medicine? This medicine is for infusion into a vein. It is given by a health care professional in a hospital or clinic setting. Talk to your pediatrician regarding the use of this medicine in children. Special care may be needed. Overdosage: If you think you've taken too much of this medicine contact a poison control center or emergency room at once. Overdosage: If you think you have taken too much of this medicine contact a poison control center or emergency room at once. NOTE: This medicine is only for you. Do not share this medicine with others. What if I miss a dose? It is important not to miss your dose. Call your doctor or health care professional if you are unable to keep an appointment. What may interact with this medicine? This medicine may interact with the following medications: -other iron products This list may not describe all possible interactions. Give your health care provider a list of all the medicines, herbs, non-prescription drugs, or dietary supplements you use. Also tell them if you smoke, drink alcohol, or use illegal drugs. Some items may interact with your medicine. What should I watch  for while using this medicine? Visit your doctor or healthcare professional regularly. Tell your doctor or healthcare professional if your symptoms do not start to get better or if they get worse. You may need blood work done while you are taking this medicine. You may need to follow a special diet. Talk to your doctor. Foods that contain iron include: whole grains/cereals, dried fruits, beans, or peas, leafy green vegetables, and organ meats (liver, kidney). What side effects may I notice from receiving this medicine? Side effects that you should report to your doctor or health care professional as soon as possible: -allergic reactions like skin rash, itching or hives, swelling of the face, lips, or tongue -breathing problems -changes in blood pressure -feeling faint or lightheaded, falls -fever or chills -flushing, sweating, or hot feelings -swelling of the ankles or feet Side effects that usually do not require medical attention (Report these to your doctor or health care professional if they continue or are bothersome.): -diarrhea -headache -nausea, vomiting -stomach pain This list may not describe all possible side effects. Call your doctor for medical advice about side effects. You may report side effects to FDA at 1-800-FDA-1088. Where should I keep my medicine? This drug is given in a hospital or clinic and will not be stored at home. NOTE: This sheet is a summary. It may not cover all possible information. If you have questions about this medicine, talk to your doctor, pharmacist, or health care provider.  2012, Elsevier/Gold Standard. (09/22/2007 9:48:25 PM) 

## 2011-11-13 ENCOUNTER — Ambulatory Visit: Payer: PRIVATE HEALTH INSURANCE

## 2011-11-25 ENCOUNTER — Ambulatory Visit (INDEPENDENT_AMBULATORY_CARE_PROVIDER_SITE_OTHER): Payer: PRIVATE HEALTH INSURANCE | Admitting: Family

## 2011-11-25 ENCOUNTER — Encounter: Payer: Self-pay | Admitting: Family

## 2011-11-25 VITALS — BP 120/80 | HR 68 | Temp 98.4°F | Resp 16 | Wt 274.1 lb

## 2011-11-25 DIAGNOSIS — R109 Unspecified abdominal pain: Secondary | ICD-10-CM

## 2011-11-25 DIAGNOSIS — K3184 Gastroparesis: Secondary | ICD-10-CM

## 2011-11-25 DIAGNOSIS — M545 Low back pain, unspecified: Secondary | ICD-10-CM

## 2011-11-25 DIAGNOSIS — Z0189 Encounter for other specified special examinations: Secondary | ICD-10-CM

## 2011-11-25 DIAGNOSIS — R110 Nausea: Secondary | ICD-10-CM

## 2011-11-25 DIAGNOSIS — R350 Frequency of micturition: Secondary | ICD-10-CM

## 2011-11-25 DIAGNOSIS — R32 Unspecified urinary incontinence: Secondary | ICD-10-CM | POA: Insufficient documentation

## 2011-11-25 HISTORY — DX: Unspecified urinary incontinence: R32

## 2011-11-25 HISTORY — DX: Low back pain, unspecified: M54.50

## 2011-11-25 LAB — BASIC METABOLIC PANEL WITH GFR
BUN: 7 mg/dL (ref 6–23)
CO2: 32 meq/L (ref 19–32)
Calcium: 8.7 mg/dL (ref 8.4–10.5)
Chloride: 101 meq/L (ref 96–112)
Creat: 0.52 mg/dL (ref 0.50–1.10)
Glucose, Bld: 88 mg/dL (ref 70–99)
Potassium: 4.3 meq/L (ref 3.5–5.3)
Sodium: 138 meq/L (ref 135–145)

## 2011-11-25 LAB — POCT URINALYSIS DIPSTICK
Bilirubin, UA: NEGATIVE
Glucose, UA: NEGATIVE
Ketones, UA: NEGATIVE
Leukocytes, UA: NEGATIVE
Nitrite, UA: NEGATIVE
Protein, UA: NEGATIVE
Spec Grav, UA: <=1.005
Urobilinogen, UA: 0.2
pH, UA: 5

## 2011-11-25 LAB — HEPATIC FUNCTION PANEL
ALT: 19 U/L (ref 0–35)
AST: 26 U/L (ref 0–37)
Albumin: 3.7 g/dL (ref 3.5–5.2)
Alkaline Phosphatase: 79 U/L (ref 39–117)
Bilirubin, Direct: 0.1 mg/dL (ref 0.0–0.3)
Indirect Bilirubin: 0.2 mg/dL (ref 0.0–0.9)
Total Bilirubin: 0.3 mg/dL (ref 0.3–1.2)
Total Protein: 6.8 g/dL (ref 6.0–8.3)

## 2011-11-25 LAB — AMYLASE: Amylase: 26 U/L (ref 0–105)

## 2011-11-25 LAB — LIPASE: Lipase: 21 U/L (ref 0–75)

## 2011-11-25 NOTE — Assessment & Plan Note (Signed)
New.  Refer to urology for further evaluation.

## 2011-11-25 NOTE — Assessment & Plan Note (Signed)
Likely musculoskeletal but will check CT to exclude stone.  UA + blood but pt has period today.  Will send urine for culture and plan to treat if positive.

## 2011-11-25 NOTE — Patient Instructions (Addendum)
Please complete your blood work prior to leaving.  Complete CT on the first floor- you will be contacted about your appointment. Follow up with Dr. Elnoria Howard re: nausea. Keep upcoming follow up with Dr. Lovell Sheehan. Follow up in 1 month, sooner if problems/concerns.
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
## 2011-11-25 NOTE — Assessment & Plan Note (Signed)
This is likely cause for pt's nausea.  Will obtain lft, amylase/lipase, but I have instructed pt to follow up with Dr. Elnoria Howard, her gastroenterologist.

## 2011-11-26 ENCOUNTER — Ambulatory Visit (HOSPITAL_BASED_OUTPATIENT_CLINIC_OR_DEPARTMENT_OTHER)
Admission: RE | Admit: 2011-11-26 | Discharge: 2011-11-26 | Disposition: A | Discharge status: Home / Self Care | Payer: PRIVATE HEALTH INSURANCE | Source: Ambulatory Visit | Attending: Family | Admitting: Family

## 2011-11-26 DIAGNOSIS — M545 Low back pain, unspecified: Secondary | ICD-10-CM | POA: Insufficient documentation

## 2011-11-26 DIAGNOSIS — R109 Unspecified abdominal pain: Secondary | ICD-10-CM | POA: Insufficient documentation

## 2011-11-27 ENCOUNTER — Encounter: Payer: Self-pay | Admitting: Family

## 2011-11-27 LAB — URINE CULTURE
Colony Count: NO GROWTH
Organism ID, Bacteria: NO GROWTH

## 2011-11-28 ENCOUNTER — Ambulatory Visit (HOSPITAL_BASED_OUTPATIENT_CLINIC_OR_DEPARTMENT_OTHER): Payer: PRIVATE HEALTH INSURANCE

## 2011-11-28 VITALS — BP 120/80 | HR 56 | Temp 97.2°F | Resp 18

## 2011-11-28 DIAGNOSIS — D510 Vitamin B12 deficiency anemia due to intrinsic factor deficiency: Secondary | ICD-10-CM

## 2011-11-28 MED ORDER — CYANOCOBALAMIN 1000 MCG/ML IJ SOLN
1000.0000 ug | Freq: Once | INTRAMUSCULAR | Status: AC
  Administered 2011-11-28: 1000 ug via INTRAMUSCULAR

## 2011-11-28 NOTE — Patient Instructions (Signed)

## 2011-12-02 ENCOUNTER — Ambulatory Visit: Payer: PRIVATE HEALTH INSURANCE | Admitting: Family

## 2011-12-16 ENCOUNTER — Other Ambulatory Visit: Payer: Self-pay | Admitting: Family

## 2011-12-29 ENCOUNTER — Other Ambulatory Visit (HOSPITAL_BASED_OUTPATIENT_CLINIC_OR_DEPARTMENT_OTHER): Payer: PRIVATE HEALTH INSURANCE | Admitting: Lab

## 2011-12-29 ENCOUNTER — Ambulatory Visit (HOSPITAL_BASED_OUTPATIENT_CLINIC_OR_DEPARTMENT_OTHER): Payer: PRIVATE HEALTH INSURANCE | Admitting: Medical

## 2011-12-29 ENCOUNTER — Ambulatory Visit (HOSPITAL_BASED_OUTPATIENT_CLINIC_OR_DEPARTMENT_OTHER): Payer: PRIVATE HEALTH INSURANCE

## 2011-12-29 VITALS — BP 121/65 | HR 62 | Temp 98.2°F | Resp 18 | Wt 279.0 lb

## 2011-12-29 DIAGNOSIS — D509 Iron deficiency anemia, unspecified: Secondary | ICD-10-CM

## 2011-12-29 DIAGNOSIS — D518 Other vitamin B12 deficiency anemias: Secondary | ICD-10-CM

## 2011-12-29 DIAGNOSIS — D508 Other iron deficiency anemias: Secondary | ICD-10-CM

## 2011-12-29 DIAGNOSIS — D510 Vitamin B12 deficiency anemia due to intrinsic factor deficiency: Secondary | ICD-10-CM

## 2011-12-29 LAB — IRON AND TIBC
%SAT: 27 % (ref 20–55)
Iron: 77 ug/dL (ref 42–145)
TIBC: 285 ug/dL (ref 250–470)
UIBC: 208 ug/dL (ref 125–400)

## 2011-12-29 LAB — CBC WITH DIFFERENTIAL (CANCER CENTER ONLY)
BASO#: 0 10*3/uL (ref 0.0–0.2)
BASO%: 0.4 % (ref 0.0–2.0)
EOS%: 3.3 % (ref 0.0–7.0)
Eosinophils Absolute: 0.2 10*3/uL (ref 0.0–0.5)
HCT: 38.8 % (ref 34.8–46.6)
HGB: 13.1 g/dL (ref 11.6–15.9)
LYMPH#: 1.2 10*3/uL (ref 0.9–3.3)
LYMPH%: 24.9 % (ref 14.0–48.0)
MCH: 30.1 pg (ref 26.0–34.0)
MCHC: 33.8 g/dL (ref 32.0–36.0)
MCV: 89 fL (ref 81–101)
MONO#: 0.4 10*3/uL (ref 0.1–0.9)
MONO%: 9 % (ref 0.0–13.0)
NEUT#: 3.1 10*3/uL (ref 1.5–6.5)
NEUT%: 62.4 % (ref 39.6–80.0)
Platelets: 161 10*3/uL (ref 145–400)
RBC: 4.35 10*6/uL (ref 3.70–5.32)
RDW: 13.9 % (ref 11.1–15.7)
WBC: 4.9 10*3/uL (ref 3.9–10.0)

## 2011-12-29 LAB — FERRITIN: Ferritin: 79 ng/mL (ref 10–291)

## 2011-12-29 MED ORDER — CYANOCOBALAMIN 1000 MCG/ML IJ SOLN
1000.0000 ug | Freq: Once | INTRAMUSCULAR | Status: AC
  Administered 2011-12-29: 1000 ug via INTRAMUSCULAR

## 2011-12-29 NOTE — Progress Notes (Signed)
Diagnoses: #1 recurrent iron deficiency anemia. #2, pernicious anemia.  Current therapy: #1 IV iron as indicated.  Last dose of IV iron was on 10/30/2011 #2, vitamin B12 1 mg IM each month.  Interim history: Megan [name] presents today for an office followup visit.  The last time she received IV iron was back on 10/30/2011.  Unfortunately, she, reports, that she is going to have to have the procedure, done by Dr. Vernie Ammons, with [facility] urology.  Apparently, she has had significant microscopic, blood in her urine.  She's also been having some rectal bleeding, and is due to have a colonoscopy next week by Dr. Elnoria Howard.  She reports, that she has had some fatigue.  She is not craving any type of ice.  She does not report any lightheadedness or syncopal episodes.  She still continues to have some issues with her back.  She reports, she has a good appetite.  She denies any diarrhea, or constipation.  She does have some nausea, but no active, vomiting.  She denies any cough, chest pain, shortness of breath.  She denies any fevers, chills, or night sweats.  She denies any headaches, visual changes, or rashes.  Of note, she does have a history of autoimmune hepatitis, and does follow up with Dr. Elnoria Howard regarding this.  Her blood counts look good.  Today.  We'll wait and see what her iron panel shows.  She will continue to receive monthly B12 injections.  Review of Systems: Constitutional:Negative for malaise/fatigue, fever, chills, weight loss, diaphoresis, activity change, appetite change, and unexpected weight change.  HEENT: Negative for double vision, blurred vision, visual loss, ear pain, tinnitus, congestion, rhinorrhea, epistaxis sore throat or sinus disease, oral pain/lesion, tongue soreness Respiratory: Negative for cough, chest tightness, shortness of breath, wheezing and stridor.  Cardiovascular: Negative for chest pain, palpitations, leg swelling, orthopnea, PND, DOE or claudication Gastrointestinal:  Negative for nausea, vomiting, abdominal pain, diarrhea, constipation, blood in stool, melena, hematochezia, abdominal distention, anal bleeding, rectal pain, anorexia and hematemesis.  Genitourinary: Negative for dysuria, frequency, hematuria,  Musculoskeletal: Negative for myalgias, back pain, joint swelling, arthralgias and gait problem.  Skin: Negative for rash, color change, pallor and wound.  Neurological:. Negative for dizziness/light-headedness, tremors, seizures, syncope, facial asymmetry, speech difficulty, weakness, numbness, headaches and paresthesias.  Hematological: Negative for adenopathy. Does not bruise/bleed easily.  Psychiatric/Behavioral:  Negative for depression, no loss of interest in normal activity or change in sleep pattern.   Physical Exam: This is a 46 year old, obese, white female, in no obvious distress Vitals: Temperature 98.2 degrees, pulse 62, respirations 18, blood pressure 121/65.  Weight 279 pounds HEENT reveals a normocephalic, atraumatic skull, no scleral icterus, no oral lesions  Neck is supple without any cervical or supraclavicular adenopathy.  Lungs are clear to auscultation bilaterally. There are no wheezes, rales or rhonci Cardiac is regular rate and rhythm with a normal S1 and S2. There are no murmurs, rubs, or bruits.  Abdomen is soft with good bowel sounds, there is no palpable mass. There is no palpable hepatosplenomegaly. There is no palpable fluid wave.  Musculoskeletal no tenderness of the spine, ribs, or hips.  Extremities there are no clubbing, cyanosis, or edema.  Skin no petechia, purpura or ecchymosis Neurologic is nonfocal.  Laboratory Data: White count 4.9, hemoglobin 13.1, hematocrit 38.8, platelets 161,000  Current Outpatient Prescriptions on File Prior to Visit  Medication Sig Dispense Refill  . albuterol (PROAIR HFA) 108 (90 BASE) MCG/ACT inhaler Inhale 2 puffs into the lungs every 6 (  six) hours as needed. For wheezing  1 Inhaler   0  . albuterol-ipratropium (COMBIVENT) 18-103 MCG/ACT inhaler Inhale 2 puffs into the lungs 3 (three) times daily as needed.      Marland Kitchen aspirin 81 MG tablet Take 81 mg by mouth daily.       . cyanocobalamin (,VITAMIN B-12,) 1000 MCG/ML injection Inject 1,000 mcg into the muscle every 30 (thirty) days.      Marland Kitchen levothyroxine (SYNTHROID, LEVOTHROID) 175 MCG tablet Take 175 mcg by mouth daily. PT STATES SHE USES BRAND NAME ONLY.      . metaxalone (SKELAXIN) 800 MG tablet Take 800 mg by mouth 3 (three) times daily.       . metoprolol tartrate (LOPRESSOR) 25 MG tablet Take 25 mg by mouth Twice daily.      Marland Kitchen oxycodone (ROXICODONE) 30 MG immediate release tablet Take 30 mg by mouth every 4 (four) hours as needed.      . promethazine (PHENERGAN) 50 MG tablet Take 50 mg by mouth every 6 (six) hours as needed. For nausea      . SYNTHROID 175 MCG tablet TAKE 1 TABLET BY MOUTH EVERY DAY  30 tablet  2   Assessment/Plan: This is a 46 year old, white female, with the following issues:  #1.  Iron deficiency anemia.  Her blood counts look good today.  We did obtain an iron panel on her today.  We will await the results, and if she needs IV iron we will call her.  #2.  Pernicious anemia.  She will continue does receive monthly B12 injections.  #3.  History of autoimmune hepatitis.  She follows up with Dr. Elnoria Howard.  #4.  Rectal bleeding.  She's going to have a colonoscopy by Dr. Elnoria Howard.  Next week.  #5.  Hematuria.  She is going to followup with Dr. Vernie Ammons this week.  #6.  Followup.  We will follow back up with Megan [name] in 2 months, but before then should there be questions or concerns.

## 2011-12-30 ENCOUNTER — Ambulatory Visit (INDEPENDENT_AMBULATORY_CARE_PROVIDER_SITE_OTHER): Payer: PRIVATE HEALTH INSURANCE | Admitting: Family

## 2011-12-30 ENCOUNTER — Other Ambulatory Visit (HOSPITAL_COMMUNITY)
Admission: RE | Admit: 2011-12-30 | Discharge: 2011-12-30 | Disposition: A | Discharge status: Home / Self Care | Payer: PRIVATE HEALTH INSURANCE | Source: Ambulatory Visit | Attending: Family | Admitting: Family

## 2011-12-30 ENCOUNTER — Encounter: Payer: Self-pay | Admitting: Family

## 2011-12-30 VITALS — BP 110/86 | HR 63 | Temp 99.0°F | Resp 16 | Ht 67.0 in | Wt 280.1 lb

## 2011-12-30 DIAGNOSIS — R3129 Other microscopic hematuria: Secondary | ICD-10-CM | POA: Insufficient documentation

## 2011-12-30 DIAGNOSIS — Z124 Encounter for screening for malignant neoplasm of cervix: Secondary | ICD-10-CM | POA: Insufficient documentation

## 2011-12-30 DIAGNOSIS — D508 Other iron deficiency anemias: Secondary | ICD-10-CM

## 2011-12-30 DIAGNOSIS — D649 Anemia, unspecified: Secondary | ICD-10-CM

## 2011-12-30 DIAGNOSIS — Z0000 Encounter for general adult medical examination without abnormal findings: Secondary | ICD-10-CM

## 2011-12-30 DIAGNOSIS — Z01419 Encounter for gynecological examination (general) (routine) without abnormal findings: Secondary | ICD-10-CM

## 2011-12-30 DIAGNOSIS — I10 Essential (primary) hypertension: Secondary | ICD-10-CM

## 2011-12-30 DIAGNOSIS — E039 Hypothyroidism, unspecified: Secondary | ICD-10-CM

## 2011-12-30 DIAGNOSIS — IMO0001 Reserved for inherently not codable concepts without codable children: Secondary | ICD-10-CM

## 2011-12-30 DIAGNOSIS — E119 Type 2 diabetes mellitus without complications: Secondary | ICD-10-CM

## 2011-12-30 HISTORY — DX: Other microscopic hematuria: R31.29

## 2011-12-30 LAB — HEMOGLOBIN A1C
Hgb A1c MFr Bld: 5.2 % (ref <5.7)
Mean Plasma Glucose: 103 mg/dL (ref <117)

## 2011-12-30 LAB — TSH: TSH: 6.398 u[IU]/mL — ABNORMAL HIGH (ref 0.350–4.500)

## 2011-12-30 MED ORDER — ALBUTEROL SULFATE HFA 108 (90 BASE) MCG/ACT IN AERS: 2.0000 | INHALATION_SPRAY | Freq: Four times a day (QID) | RESPIRATORY_TRACT | Status: DC | PRN

## 2011-12-30 MED ORDER — IPRATROPIUM-ALBUTEROL 18-103 MCG/ACT IN AERO: 2.0000 | INHALATION_SPRAY | Freq: Three times a day (TID) | RESPIRATORY_TRACT | Status: DC | PRN

## 2011-12-30 NOTE — Patient Instructions (Addendum)
Schedule your mammogram on the first floor.  Complete your blood work prior to leaving. Follow up in 3 months, (come fasting to this appointment). Happy Holidays!

## 2011-12-30 NOTE — Assessment & Plan Note (Signed)
Work up ongoing per urology.

## 2011-12-30 NOTE — Assessment & Plan Note (Signed)
Deteriorated. Declines cymbalta or other med for fibromyalgia.

## 2011-12-30 NOTE — Assessment & Plan Note (Addendum)
Obtain A1C, urine microalbumin.   

## 2011-12-30 NOTE — Progress Notes (Signed)
  Subjective:    Patient ID: Megan [name], female    DOB: [birth year], 46 y.o.   MRN: [id number]  HPI  Subjective:   Patient here for Medicare annual wellness visit and management of other chronic and acute problems.  Diabetes- Not checking sugars.  Denies polyuria.    Microscopic hematuria- saw Dr. Vernie Ammons she is undergoing work up. She has CT scan scheduled for Monday.  She was started on med for overactive bladder but some improvement.  Fibromyalgia/Arthritis- reports that aches/pains are "much worse."  Declines cymbalta.  HTN- she continues beta blocker- Reports tolerating well.  Hypothyroid- she continues synthroid without difficulty. She is tired.  "every day." Wakes up feeling rested.  Declines Sleep study.   Preventative care- due for mammogram.  Pap smear- last was "years ago." Will do today.   Risk factors:   At risk for CAD due to hx of hyperlipidemia and diabetes.  Roster of Physicians Providing Medical Care to Patient: Dr. Vernie Ammons Dr. Alanda Amass Dr. Elnoria Howard (GI Dr. Tressie Stalker Dr. Thyra Breed (pain management) Dr. Myna Hidalgo (Heme) Dr. Queen Blossom (ortho)  Activities of Daily Living  In your present state of health, do you have any difficulty performing the following activities? Preparing food and eating?: No  Bathing yourself: No  Getting dressed: No  Using the toilet:No  Moving around from place to place: No  In the past year have you fallen or had a near fall?: Yes, feels that it is related to her poor peripheral vision.   Home Safety: Has smoke detector and wears seat belts. No firearms. No excess sun exposure.  Diet and Exercise  Current exercise habits: Some walking Dietary issues discussed:  Reports healthy food choices.  Depression Screen  (Note: if answer to either of the following is "Yes", then a more complete depression screening is indicated)  Q1: Over the past two weeks, have you felt down, depressed or hopeless?no  Q2: Over the past two  weeks, have you felt little interest or pleasure in doing things? no   The following portions of the patient's history were reviewed and updated as appropriate: allergies, current medications, past family history, past medical history, past social history, past surgical history and problem list.   Objective:   Vision: right eye prosthesis Hearing: able to hear forced whisper at 6 feet. Body mass index: see nursing Cognitive Impairment Assessment: cognition, memory and judgment appear normal.   Assessment:   Medicare wellness utd on preventive parameters   Plan:   During the course of the visit the patient was educated and counseled about appropriate screening and preventive services including:       Fall prevention   Screening mammography  Bone densitometry screening  Diabetes screening  Nutrition counseling   Vaccines / LABS Flu shot is up to date Check A1C/TSH Patient Instructions (the written plan) was given to the patient.      Review of Systems     Objective:   Physical Exam        Assessment & Plan:

## 2011-12-30 NOTE — Progress Notes (Signed)
  Subjective:    Patient ID: Megan [name], female    DOB: 1965-11-02, 46 y.o.   MRN: [id number]  HPI    Review of Systems     Objective:   Physical Exam Physical Exam  Constitutional: She is oriented to person, place, and time. She appears well-developed and well-nourished. No distress.  HENT:  Head: Normocephalic and atraumatic.  Right Ear: Tympanic membrane and ear canal normal.  Left Ear: Tympanic membrane and ear canal normal.  Mouth/Throat: Oropharynx is clear and moist.  Eyes: R eye prosthesis Neck: Normal range of motion. No thyromegaly present.  Cardiovascular: Normal rate and regular rhythm.   No murmur heard. Pulmonary/Chest: Effort normal and breath sounds normal. No respiratory distress. He has no wheezes. She has no rales. She exhibits no tenderness.  Abdominal: Soft. Bowel sounds are normal. He exhibits no distension and no mass. There is no tenderness. There is no rebound and no guarding.  Musculoskeletal: She exhibits no edema.  Lymphadenopathy:    She has no cervical adenopathy.  Neurological: She is alert and oriented to person, place, and time. She exhibits normal muscle tone. Coordination normal.  Skin: Skin is warm and dry.  Psychiatric: She has a normal mood and affect. Her behavior is normal. Judgment and thought content normal.  Breasts: Examined lying Right: Without masses, retractions, discharge or axillary adenopathy.  Left: Without masses, retractions, discharge or axillary adenopathy.  Inguinal/mons: Normal without inguinal adenopathy  External genitalia: Normal  BUS/Urethra/Skene's glands: Normal  Bladder: Normal  Vagina: Normal  Cervix: Normal (pap performed with chaperone) Uterus: normal in size, shape and contour. Midline and mobile  Adnexa/parametria:  Rt: Without masses or tenderness.  Lt: Without masses or tenderness.  Anus and perineum: Normal           Assessment & Plan:          Assessment & Plan:

## 2011-12-30 NOTE — Assessment & Plan Note (Signed)
BP stable. Continue metoprolol 

## 2011-12-30 NOTE — Assessment & Plan Note (Signed)
S/P iv iron infusion with Dr. Myna Hidalgo.

## 2011-12-31 ENCOUNTER — Telehealth: Payer: Self-pay | Admitting: Family

## 2011-12-31 ENCOUNTER — Telehealth: Payer: Self-pay | Admitting: *Deleted

## 2011-12-31 DIAGNOSIS — E039 Hypothyroidism, unspecified: Secondary | ICD-10-CM

## 2011-12-31 LAB — MICROALBUMIN / CREATININE URINE RATIO
Creatinine, Urine: 99.8 mg/dL
Microalb Creat Ratio: 5.4 mg/g (ref 0.0–30.0)
Microalb, Ur: 0.54 mg/dL (ref 0.00–1.89)

## 2011-12-31 MED ORDER — LEVOTHYROXINE SODIUM 200 MCG PO TABS: 200.0000 ug | ORAL_TABLET | Freq: Every day | ORAL | Status: DC

## 2011-12-31 MED ORDER — IPRATROPIUM-ALBUTEROL 20-100 MCG/ACT IN AERS: 1.0000 | INHALATION_SPRAY | Freq: Four times a day (QID) | RESPIRATORY_TRACT | Status: DC | PRN

## 2011-12-31 NOTE — Telephone Encounter (Signed)
Pt left message returning my call. Reached pt at [phone] and notified her of instructions below. Future order placed for the week of 02/11/12 and copy given to the lab.

## 2011-12-31 NOTE — Telephone Encounter (Signed)
Received fax from pharmacy stating: "Can no longer get Combivent Inhaler, has to written for Combivent Respimat"/SLS

## 2011-12-31 NOTE — Telephone Encounter (Signed)
Left message with pt's mother to have pt return my call. 

## 2011-12-31 NOTE — Telephone Encounter (Signed)
Pls call pt and let her know that labs show thyroid medication needs to be increased. Increase synthroid to once daily.  Repeat TSH in 6 weeks

## 2012-01-01 ENCOUNTER — Telehealth: Payer: Self-pay | Admitting: *Deleted

## 2012-01-01 NOTE — Telephone Encounter (Signed)
Message copied by Anselm Jungling on Thu Jan 01, 2012 10:16 AM ------      Message from: Megan [name]      Created: Tue Dec 30, 2011  9:03 PM       Call- -labs look ok!!  Cindee Lame

## 2012-01-01 NOTE — Telephone Encounter (Signed)
Called patient to let her know that her labwork all looked good per dr. Myna Hidalgo

## 2012-01-02 ENCOUNTER — Encounter: Payer: Self-pay | Admitting: Family

## 2012-01-13 ENCOUNTER — Telehealth: Payer: Self-pay | Admitting: Hematology & Oncology

## 2012-01-13 NOTE — Telephone Encounter (Signed)
Faxed records to [facility] per request.

## 2012-01-19 ENCOUNTER — Other Ambulatory Visit: Payer: Self-pay | Admitting: Family

## 2012-01-19 DIAGNOSIS — Z1231 Encounter for screening mammogram for malignant neoplasm of breast: Secondary | ICD-10-CM

## 2012-01-20 ENCOUNTER — Ambulatory Visit (HOSPITAL_BASED_OUTPATIENT_CLINIC_OR_DEPARTMENT_OTHER)
Admission: RE | Admit: 2012-01-20 | Discharge: 2012-01-20 | Disposition: A | Discharge status: Home / Self Care | Payer: PRIVATE HEALTH INSURANCE | Source: Ambulatory Visit | Attending: Family | Admitting: Family

## 2012-01-20 ENCOUNTER — Inpatient Hospital Stay (HOSPITAL_BASED_OUTPATIENT_CLINIC_OR_DEPARTMENT_OTHER): Admission: RE | Admit: 2012-01-20 | Payer: PRIVATE HEALTH INSURANCE | Source: Ambulatory Visit

## 2012-01-20 DIAGNOSIS — Z1231 Encounter for screening mammogram for malignant neoplasm of breast: Secondary | ICD-10-CM | POA: Insufficient documentation

## 2012-01-24 ENCOUNTER — Other Ambulatory Visit: Payer: Self-pay | Admitting: Family

## 2012-01-26 NOTE — Telephone Encounter (Signed)
Levothyroxine refill denied as pt is no longer on this dose; was increased to and Rx sent 12/31/11. Current request denied.

## 2012-01-29 ENCOUNTER — Ambulatory Visit (HOSPITAL_BASED_OUTPATIENT_CLINIC_OR_DEPARTMENT_OTHER): Payer: PRIVATE HEALTH INSURANCE

## 2012-01-29 VITALS — BP 108/59 | HR 50 | Temp 98.3°F | Resp 20

## 2012-01-29 DIAGNOSIS — D510 Vitamin B12 deficiency anemia due to intrinsic factor deficiency: Secondary | ICD-10-CM

## 2012-01-29 MED ORDER — CYANOCOBALAMIN 1000 MCG/ML IJ SOLN
1000.0000 ug | Freq: Once | INTRAMUSCULAR | Status: AC
  Administered 2012-01-29: 1000 ug via INTRAMUSCULAR

## 2012-01-29 NOTE — Patient Instructions (Signed)
Cyanocobalamin, Pyridoxine, and Folate What is this medicine? A multivitamin containing folic acid, vitamin B6, and vitamin B12. This medicine may be used for other purposes; ask your health care provider or pharmacist if you have questions. What should I tell my health care provider before I take this medicine? They need to know if you have any of these conditions: -bleeding or clotting disorder -history of anemia of any type -other chronic health condition -an unusual or allergic reaction to vitamins, other medicines, foods, dyes, or preservatives -pregnant or trying to get pregnant -breast-feeding How should I use this medicine? Take by mouth with a glass of water. May take with food. Follow the directions on the prescription label. It is usually given once a day. Do not take your medicine more often than directed. Contact your pediatrician regarding the use of this medicine in children. Special care may be needed. Overdosage: If you think you have taken too much of this medicine contact a poison control center or emergency room at once. NOTE: This medicine is only for you. Do not share this medicine with others. What if I miss a dose? If you miss a dose, take it as soon as you can. If it is almost time for your next dose, take only that dose. Do not take double or extra doses. What may interact with this medicine? -levodopa This list may not describe all possible interactions. Give your health care provider a list of all the medicines, herbs, non-prescription drugs, or dietary supplements you use. Also tell them if you smoke, drink alcohol, or use illegal drugs. Some items may interact with your medicine. What should I watch for while using this medicine? See your health care professional for regular checks on your progress. Remember that vitamin supplements do not replace the need for good nutrition from a balanced diet. What side effects may I notice from receiving this medicine? Side  effects that you should report to your doctor or health care professional as soon as possible: -allergic reaction such as skin rash or difficulty breathing -vomiting Side effects that usually do not require medical attention (report to your doctor or health care professional if they continue or are bothersome): -nausea -stomach upset This list may not describe all possible side effects. Call your doctor for medical advice about side effects. You may report side effects to FDA at 1-800-FDA-1088. Where should I keep my medicine? Keep out of the reach of children. Most vitamins should be stored at controlled room temperature. Check your specific product directions. Protect from heat and moisture. Throw away any unused medicine after the expiration date. NOTE: This sheet is a summary. It may not cover all possible information. If you have questions about this medicine, talk to your doctor, pharmacist, or health care provider.  2012, Elsevier/Gold Standard. (02/20/2007 12:59:55 AM) 

## 2012-02-25 ENCOUNTER — Other Ambulatory Visit: Payer: Self-pay | Admitting: Family

## 2012-03-01 ENCOUNTER — Other Ambulatory Visit (HOSPITAL_BASED_OUTPATIENT_CLINIC_OR_DEPARTMENT_OTHER): Payer: PRIVATE HEALTH INSURANCE | Admitting: Lab

## 2012-03-01 ENCOUNTER — Ambulatory Visit: Payer: PRIVATE HEALTH INSURANCE

## 2012-03-01 ENCOUNTER — Ambulatory Visit: Payer: PRIVATE HEALTH INSURANCE | Admitting: Hematology & Oncology

## 2012-03-01 DIAGNOSIS — D508 Other iron deficiency anemias: Secondary | ICD-10-CM

## 2012-03-01 DIAGNOSIS — D510 Vitamin B12 deficiency anemia due to intrinsic factor deficiency: Secondary | ICD-10-CM

## 2012-03-01 DIAGNOSIS — E039 Hypothyroidism, unspecified: Secondary | ICD-10-CM

## 2012-03-01 DIAGNOSIS — D509 Iron deficiency anemia, unspecified: Secondary | ICD-10-CM

## 2012-03-01 LAB — CBC WITH DIFFERENTIAL (CANCER CENTER ONLY)
BASO#: 0 10*3/uL (ref 0.0–0.2)
BASO%: 0.4 % (ref 0.0–2.0)
EOS%: 4.2 % (ref 0.0–7.0)
Eosinophils Absolute: 0.2 10*3/uL (ref 0.0–0.5)
HCT: 38 % (ref 34.8–46.6)
HGB: 12.7 g/dL (ref 11.6–15.9)
LYMPH#: 1 10*3/uL (ref 0.9–3.3)
LYMPH%: 22.4 % (ref 14.0–48.0)
MCH: 29.1 pg (ref 26.0–34.0)
MCHC: 33.4 g/dL (ref 32.0–36.0)
MCV: 87 fL (ref 81–101)
MONO#: 0.4 10*3/uL (ref 0.1–0.9)
MONO%: 9.1 % (ref 0.0–13.0)
NEUT#: 2.9 10*3/uL (ref 1.5–6.5)
NEUT%: 63.9 % (ref 39.6–80.0)
Platelets: 146 10*3/uL (ref 145–400)
RBC: 4.36 10*6/uL (ref 3.70–5.32)
RDW: 12.9 % (ref 11.1–15.7)
WBC: 4.5 10*3/uL (ref 3.9–10.0)

## 2012-03-01 LAB — IRON AND TIBC
%SAT: 20 % (ref 20–55)
Iron: 62 ug/dL (ref 42–145)
TIBC: 309 ug/dL (ref 250–470)
UIBC: 247 ug/dL (ref 125–400)

## 2012-03-01 LAB — FERRITIN: Ferritin: 43 ng/mL (ref 10–291)

## 2012-03-03 ENCOUNTER — Ambulatory Visit (HOSPITAL_BASED_OUTPATIENT_CLINIC_OR_DEPARTMENT_OTHER): Payer: PRIVATE HEALTH INSURANCE | Admitting: Medical

## 2012-03-03 ENCOUNTER — Ambulatory Visit (HOSPITAL_BASED_OUTPATIENT_CLINIC_OR_DEPARTMENT_OTHER): Payer: PRIVATE HEALTH INSURANCE

## 2012-03-03 DIAGNOSIS — D510 Vitamin B12 deficiency anemia due to intrinsic factor deficiency: Secondary | ICD-10-CM

## 2012-03-03 DIAGNOSIS — D509 Iron deficiency anemia, unspecified: Secondary | ICD-10-CM

## 2012-03-03 DIAGNOSIS — N926 Irregular menstruation, unspecified: Secondary | ICD-10-CM

## 2012-03-03 DIAGNOSIS — K625 Hemorrhage of anus and rectum: Secondary | ICD-10-CM

## 2012-03-03 DIAGNOSIS — D508 Other iron deficiency anemias: Secondary | ICD-10-CM

## 2012-03-03 MED ORDER — CYANOCOBALAMIN 1000 MCG/ML IJ SOLN
1000.0000 ug | Freq: Once | INTRAMUSCULAR | Status: AC
  Administered 2012-03-03: 1000 ug via INTRAMUSCULAR

## 2012-03-03 NOTE — Patient Instructions (Addendum)

## 2012-03-03 NOTE — Progress Notes (Signed)
Diagnoses: #1 recurrent iron deficiency anemia. #2, pernicious anemia.  Current therapy: #1 IV iron as indicated.  Last dose of IV iron was on 10/30/2011 #2, vitamin B12 1 mg IM each month.  Interim history: Megan [name] presents today for an office followup visit.  The last time she received IV iron was back on 10/30/2011.  Her most recent iron panel today to go revealed an iron of 62, with 20% saturation.  Her ferritin was 43.  She is reporting more fatigued.  She states, that she is having irregular menstrual cycles, and usually.  This indicates, she needs IV iron.  I did advise her that she needs to followup with her gynecologist for this issue.  She still continues to have some blood in her stools.  She is supposed to have a colonoscopy, done by Dr. Elnoria Howard, however, secondary to her mother been sick, she has not been able to schedule a colonoscopy.  She did followup with Dr. Vernie Ammons for some microscopic hematuria.  She did have a cystoscopy done, which was normal.  She otherwise does not report any new problems or complications.  She does followup with [name] for her hypothyroid.  She reports, she has a good appetite.  She denies any diarrhea, or constipation.  She does have some nausea, but no active, vomiting.  She denies any cough, chest pain, shortness of breath.  She denies any fevers, chills, or night sweats.  She denies any headaches, visual changes, or rashes.  Of note, she does have a history of autoimmune hepatitis, and does follow up with Dr. Elnoria Howard regarding this.    Review of Systems: Constitutional:Negative for malaise/fatigue, fever, chills, weight loss, diaphoresis, activity change, appetite change, and unexpected weight change.  HEENT: Negative for double vision, blurred vision, visual loss, ear pain, tinnitus, congestion, rhinorrhea, epistaxis sore throat or sinus disease, oral pain/lesion, tongue soreness Respiratory: Negative for cough, chest tightness, shortness of breath,  wheezing and stridor.  Cardiovascular: Negative for chest pain, palpitations, leg swelling, orthopnea, PND, DOE or claudication Gastrointestinal: Negative for nausea, vomiting, abdominal pain, diarrhea, constipation, blood in stool, melena, hematochezia, abdominal distention, anal bleeding, rectal pain, anorexia and hematemesis.  Genitourinary: Negative for dysuria, frequency, hematuria,  Musculoskeletal: Negative for myalgias, back pain, joint swelling, arthralgias and gait problem.  Skin: Negative for rash, color change, pallor and wound.  Neurological:. Negative for dizziness/light-headedness, tremors, seizures, syncope, facial asymmetry, speech difficulty, weakness, numbness, headaches and paresthesias.  Hematological: Negative for adenopathy. Does not bruise/bleed easily.  Psychiatric/Behavioral:  Negative for depression, no loss of interest in normal activity or change in sleep pattern.   Physical Exam: This is a 47 year old, obese, white female, in no obvious distress Vitals: Temperature 98.0 degrees, pulse 52, respirations 16, blood pressure 117/73, weight 285 pounds HEENT reveals a normocephalic, atraumatic skull, no scleral icterus, no oral lesions  Neck is supple without any cervical or supraclavicular adenopathy.  Lungs are clear to auscultation bilaterally. There are no wheezes, rales or rhonci Cardiac is regular rate and rhythm with a normal S1 and S2. There are no murmurs, rubs, or bruits.  Abdomen is soft with good bowel sounds, there is no palpable mass. There is no palpable hepatosplenomegaly. There is no palpable fluid wave.  Musculoskeletal no tenderness of the spine, ribs, or hips.  Extremities there are no clubbing, cyanosis, or edema.  Skin no petechia, purpura or ecchymosis Neurologic is nonfocal.  Laboratory Data:  white count 4.5, hemoglobin 12.7, hematocrit 30.0, platelets 146,000 , ferritin, 43 iron  62, with 20% saturation  Current Outpatient Prescriptions on  File Prior to Visit  Medication Sig Dispense Refill  . albuterol (PROAIR HFA) 108 (90 BASE) MCG/ACT inhaler Inhale 2 puffs into the lungs every 6 (six) hours as needed. For wheezing  1 Inhaler  2  . aspirin 81 MG tablet Take 81 mg by mouth daily.       . cyanocobalamin (,VITAMIN B-12,) 1000 MCG/ML injection Inject 1,000 mcg into the muscle every 30 (thirty) days.      . fesoterodine (TOVIAZ) 8 MG TB24 Take 8 mg by mouth daily.      . Ipratropium-Albuterol (COMBIVENT) 20-100 MCG/ACT AERS respimat Inhale 1 puff into the lungs every 6 (six) hours as needed for wheezing.  4 g  3  . metaxalone (SKELAXIN) 800 MG tablet Take 800 mg by mouth 3 (three) times daily.       . metoprolol tartrate (LOPRESSOR) 25 MG tablet Take 25 mg by mouth Twice daily.      . mirabegron ER (MYRBETRIQ) 25 MG TB24 Take 25 mg by mouth daily.      . Ondansetron (ZUPLENZ) 8 MG FILM Take 8 mg by mouth as needed. For nausea      . oxycodone (ROXICODONE) 30 MG immediate release tablet Take 30 mg by mouth every 8 (eight) hours as needed.       . promethazine (PHENERGAN) 50 MG tablet Take 50 mg by mouth every 6 (six) hours as needed. For nausea      . SYNTHROID 200 MCG tablet TAKE 1 TABLET (200 MCG TOTAL) BY MOUTH DAILY.  30 tablet  1   No current facility-administered medications on file prior to visit.   Assessment/Plan: This is a 47 year old, white female, with the following issues:  #1.  Iron deficiency anemia.  Her iron level is on the lower side of normal limits.  She is symptomatic.  We will go ahead and set her up for IV iron.  #2.  Pernicious anemia.  She will continue does receive monthly B12 injections.  #3.  History of autoimmune hepatitis.  She follows up with Dr. Elnoria Howard.  #4.  Rectal bleeding.  She needs to have a colonoscopy.  She, reports, that she has not been able to schedule this secondary to her mother being sick.  #5.  Hematuria.  She followed up with Dr. Vernie Ammons.  She did have a cystoscopy, and everything  was normal.  #6.  Followup.  We will follow back up with [name] in 2 months, but before then should there be questions or concerns.

## 2012-03-04 ENCOUNTER — Ambulatory Visit (HOSPITAL_BASED_OUTPATIENT_CLINIC_OR_DEPARTMENT_OTHER): Payer: PRIVATE HEALTH INSURANCE

## 2012-03-04 ENCOUNTER — Other Ambulatory Visit: Payer: Self-pay | Admitting: Medical

## 2012-03-04 ENCOUNTER — Ambulatory Visit: Payer: PRIVATE HEALTH INSURANCE

## 2012-03-04 VITALS — Temp 98.9°F

## 2012-03-04 DIAGNOSIS — D509 Iron deficiency anemia, unspecified: Secondary | ICD-10-CM

## 2012-03-04 DIAGNOSIS — D649 Anemia, unspecified: Secondary | ICD-10-CM

## 2012-03-04 DIAGNOSIS — D508 Other iron deficiency anemias: Secondary | ICD-10-CM

## 2012-03-04 MED ORDER — SODIUM CHLORIDE 0.9 % IV SOLN
INTRAVENOUS | Status: DC
  Administered 2012-03-04: 11:00:00 via INTRAVENOUS

## 2012-03-04 MED ORDER — SODIUM CHLORIDE 0.9 % IV SOLN: 1020.0000 mg | Freq: Once | INTRAVENOUS | Status: DC

## 2012-03-04 MED ORDER — SODIUM CHLORIDE 0.9 % IV SOLN
1020.0000 mg | Freq: Once | INTRAVENOUS | Status: AC
  Administered 2012-03-04: 1020 mg via INTRAVENOUS
  Filled 2012-03-04: qty 34

## 2012-03-30 ENCOUNTER — Ambulatory Visit: Payer: PRIVATE HEALTH INSURANCE | Admitting: Family

## 2012-04-06 ENCOUNTER — Ambulatory Visit: Payer: PRIVATE HEALTH INSURANCE | Admitting: Family

## 2012-04-14 ENCOUNTER — Encounter: Payer: Self-pay | Admitting: Family

## 2012-04-14 ENCOUNTER — Telehealth: Payer: Self-pay | Admitting: *Deleted

## 2012-04-14 ENCOUNTER — Ambulatory Visit (INDEPENDENT_AMBULATORY_CARE_PROVIDER_SITE_OTHER): Payer: PRIVATE HEALTH INSURANCE | Admitting: Family

## 2012-04-14 VITALS — BP 130/80 | HR 59 | Temp 97.8°F | Resp 16 | Ht 67.0 in | Wt 286.1 lb

## 2012-04-14 DIAGNOSIS — K754 Autoimmune hepatitis: Secondary | ICD-10-CM

## 2012-04-14 DIAGNOSIS — E039 Hypothyroidism, unspecified: Secondary | ICD-10-CM

## 2012-04-14 DIAGNOSIS — I10 Essential (primary) hypertension: Secondary | ICD-10-CM

## 2012-04-14 DIAGNOSIS — R5381 Other malaise: Secondary | ICD-10-CM

## 2012-04-14 DIAGNOSIS — E119 Type 2 diabetes mellitus without complications: Secondary | ICD-10-CM

## 2012-04-14 DIAGNOSIS — R0789 Other chest pain: Secondary | ICD-10-CM | POA: Insufficient documentation

## 2012-04-14 LAB — CBC WITH DIFFERENTIAL/PLATELET
Basophils Absolute: 0 10*3/uL (ref 0.0–0.1)
Basophils Relative: 1 % (ref 0–1)
Eosinophils Absolute: 0.2 10*3/uL (ref 0.0–0.7)
Eosinophils Relative: 4 % (ref 0–5)
HCT: 39.4 % (ref 36.0–46.0)
Hemoglobin: 13.6 g/dL (ref 12.0–15.0)
Lymphocytes Relative: 27 % (ref 12–46)
Lymphs Abs: 1.2 10*3/uL (ref 0.7–4.0)
MCH: 28.4 pg (ref 26.0–34.0)
MCHC: 34.5 g/dL (ref 30.0–36.0)
MCV: 82.3 fL (ref 78.0–100.0)
Monocytes Absolute: 0.4 10*3/uL (ref 0.1–1.0)
Monocytes Relative: 10 % (ref 3–12)
Neutro Abs: 2.5 10*3/uL (ref 1.7–7.7)
Neutrophils Relative %: 58 % (ref 43–77)
Platelets: 144 10*3/uL — ABNORMAL LOW (ref 150–400)
RBC: 4.79 MIL/uL (ref 3.87–5.11)
RDW: 14.6 % (ref 11.5–15.5)
WBC: 4.2 10*3/uL (ref 4.0–10.5)

## 2012-04-14 LAB — HEPATIC FUNCTION PANEL
ALT: 23 U/L (ref 0–35)
AST: 28 U/L (ref 0–37)
Albumin: 3.8 g/dL (ref 3.5–5.2)
Alkaline Phosphatase: 90 U/L (ref 39–117)
Bilirubin, Direct: 0.1 mg/dL (ref 0.0–0.3)
Indirect Bilirubin: 0.3 mg/dL (ref 0.0–0.9)
Total Bilirubin: 0.4 mg/dL (ref 0.3–1.2)
Total Protein: 6.4 g/dL (ref 6.0–8.3)

## 2012-04-14 LAB — TSH: TSH: 0.825 u[IU]/mL (ref 0.350–4.500)

## 2012-04-14 LAB — HEMOGLOBIN A1C
Hgb A1c MFr Bld: 5.4 % (ref <5.7)
Mean Plasma Glucose: 108 mg/dL (ref <117)

## 2012-04-14 NOTE — Telephone Encounter (Signed)
Notified pt, will continue with original order.

## 2012-04-14 NOTE — Assessment & Plan Note (Signed)
Obtain TSH, continue synthroid.  

## 2012-04-14 NOTE — Telephone Encounter (Signed)
We can address cholesterol at upcoming visit.  OK to draw other labs today- non fasting.

## 2012-04-14 NOTE — Telephone Encounter (Signed)
Pt states she will return fasting for the basic metabolic panel because she was not fasting today. Wants to know if it is also time for cholesterol check? Please advise if ok to send order for test?

## 2012-04-14 NOTE — Patient Instructions (Addendum)
Please complete lab work prior to leaving.  Follow up in 3 months, sooner if problems/concerns.  Please schedule follow up with Dr. Alanda Amass.

## 2012-04-14 NOTE — Assessment & Plan Note (Signed)
BP Readings from Last 3 Encounters:  04/14/12 130/80  01/29/12 108/59  12/30/11 110/86   BP stable on metoprolol.

## 2012-04-14 NOTE — Assessment & Plan Note (Signed)
No pain presently.  EKG is performed today and is normal. She is instructed to schedule follow up with Dr. Alanda Amass.
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
## 2012-04-14 NOTE — Assessment & Plan Note (Signed)
Obtain A1C, clinically stable on diet alone.

## 2012-04-21 ENCOUNTER — Encounter: Payer: Self-pay | Admitting: Family

## 2012-04-22 ENCOUNTER — Ambulatory Visit (HOSPITAL_BASED_OUTPATIENT_CLINIC_OR_DEPARTMENT_OTHER): Payer: PRIVATE HEALTH INSURANCE | Admitting: Hematology & Oncology

## 2012-04-22 ENCOUNTER — Other Ambulatory Visit (HOSPITAL_BASED_OUTPATIENT_CLINIC_OR_DEPARTMENT_OTHER): Payer: PRIVATE HEALTH INSURANCE | Admitting: Lab

## 2012-04-22 ENCOUNTER — Ambulatory Visit (HOSPITAL_BASED_OUTPATIENT_CLINIC_OR_DEPARTMENT_OTHER): Payer: PRIVATE HEALTH INSURANCE

## 2012-04-22 VITALS — BP 134/64 | HR 53 | Temp 98.2°F | Resp 16 | Ht 67.0 in | Wt 288.0 lb

## 2012-04-22 DIAGNOSIS — D508 Other iron deficiency anemias: Secondary | ICD-10-CM

## 2012-04-22 DIAGNOSIS — D510 Vitamin B12 deficiency anemia due to intrinsic factor deficiency: Secondary | ICD-10-CM

## 2012-04-22 DIAGNOSIS — D509 Iron deficiency anemia, unspecified: Secondary | ICD-10-CM

## 2012-04-22 LAB — CBC WITH DIFFERENTIAL (CANCER CENTER ONLY)
BASO#: 0 10*3/uL (ref 0.0–0.2)
BASO%: 0.2 % (ref 0.0–2.0)
EOS%: 3.4 % (ref 0.0–7.0)
Eosinophils Absolute: 0.2 10*3/uL (ref 0.0–0.5)
HCT: 38.7 % (ref 34.8–46.6)
HGB: 13.2 g/dL (ref 11.6–15.9)
LYMPH#: 1.3 10*3/uL (ref 0.9–3.3)
LYMPH%: 26.4 % (ref 14.0–48.0)
MCH: 29.1 pg (ref 26.0–34.0)
MCHC: 34.1 g/dL (ref 32.0–36.0)
MCV: 85 fL (ref 81–101)
MONO#: 0.5 10*3/uL (ref 0.1–0.9)
MONO%: 10.8 % (ref 0.0–13.0)
NEUT#: 2.9 10*3/uL (ref 1.5–6.5)
NEUT%: 59.2 % (ref 39.6–80.0)
Platelets: 142 10*3/uL — ABNORMAL LOW (ref 145–400)
RBC: 4.54 10*6/uL (ref 3.70–5.32)
RDW: 13.4 % (ref 11.1–15.7)
WBC: 4.9 10*3/uL (ref 3.9–10.0)

## 2012-04-22 LAB — IRON AND TIBC
%SAT: 26 % (ref 20–55)
Iron: 71 ug/dL (ref 42–145)
TIBC: 271 ug/dL (ref 250–470)
UIBC: 200 ug/dL (ref 125–400)

## 2012-04-22 LAB — FERRITIN: Ferritin: 169 ng/mL (ref 10–291)

## 2012-04-22 MED ORDER — CYANOCOBALAMIN 1000 MCG/ML IJ SOLN
1000.0000 ug | Freq: Once | INTRAMUSCULAR | Status: AC
  Administered 2012-04-22: 1000 ug via INTRAMUSCULAR

## 2012-04-22 NOTE — Progress Notes (Signed)
This office note has been dictated.

## 2012-04-26 NOTE — Progress Notes (Signed)
CC:   [name], NP [name]. Megan Howard, MD [name], [name] [name], [name]  DIAGNOSES: 1. Recurrent iron-deficiency anemia. 2. Pernicious anemia.  CURRENT THERAPY: 1. IV iron as indicated. 2. Vitamin B12 1 mg IM monthly.  INTERIM HISTORY:  Megan [name] comes in for followup.  She is doing okay. She had spine surgery about a year ago.  She still has some pain issues, but nothing like she had before.  She has extensive lower back issues. Dr. Lovell Sheehan of [facility] is in charge of this.  She has still not had a colonoscopy yet.  She is scheduled to have one, but her mother has been sick and she has not been able to find anybody to help take care of her mom.  When we last saw her, her iron studies showed a ferritin of 43, with an iron saturation of 20%.  She has had no "cravings."  She has had no cough or shortness of breath. She has had no leg swelling.  There have been no rashes.  PHYSICAL EXAMINATION:  General:  This is an obese white female in no obvious distress.  Vital signs:  Temperature of 98.2, pulse 53, respiratory rate 16, blood pressure 134/64.  Weight is 288.  Head and neck:  Normocephalic, atraumatic skull.  There are no ocular or oral lesions.  There are no palpable cervical or supraclavicular lymph nodes. Lungs:  Clear to percussion and auscultation bilaterally.  Cardiac: Regular rate and rhythm, with a normal S1 and S2.  There are no murmurs, rubs, or bruits.  Abdomen:  Soft, moderately obese.  She has good bowel sounds.  There is no fluid wave.  There is no palpable hepatosplenomegaly.  Back:  Shows a laminectomy scar in the lumbar spine.  Extremities:  Show no clubbing, cyanosis, or edema. Neurological: Shows no focal neurological deficits.  LABORATORY STUDIES:  White cell count of 4.3, hemoglobin 13.2, hematocrit 38.7, platelet count 142,000.  Her iron studies show a ferritin of 169.  Iron saturation is  26%.  IMPRESSION:  Megan [name] is a 47 year old white female with iron-deficiency anemia and pernicious anemia.  She is doing okay from my point of view.  I do not think she will need any iron.  We will go ahead and plan to get her back in another couple of months or so.  She will get her B12 shot today.    ______________________________ [name], [name] PRE/MEDQ  D:  04/22/2012  T:  04/23/2012  Job:  [id number]

## 2012-05-02 ENCOUNTER — Other Ambulatory Visit: Payer: Self-pay | Admitting: Family

## 2012-05-20 ENCOUNTER — Ambulatory Visit (HOSPITAL_BASED_OUTPATIENT_CLINIC_OR_DEPARTMENT_OTHER): Payer: PRIVATE HEALTH INSURANCE

## 2012-05-20 VITALS — BP 129/81 | HR 81 | Temp 97.1°F

## 2012-05-20 DIAGNOSIS — D510 Vitamin B12 deficiency anemia due to intrinsic factor deficiency: Secondary | ICD-10-CM

## 2012-05-20 LAB — CBC WITH DIFFERENTIAL (CANCER CENTER ONLY)
BASO#: 0 10*3/uL (ref 0.0–0.2)
BASO%: 0.5 % (ref 0.0–2.0)
EOS%: 4.6 % (ref 0.0–7.0)
Eosinophils Absolute: 0.2 10*3/uL (ref 0.0–0.5)
HCT: 41.6 % (ref 34.8–46.6)
HGB: 14 g/dL (ref 11.6–15.9)
LYMPH#: 1.3 10*3/uL (ref 0.9–3.3)
LYMPH%: 31.6 % (ref 14.0–48.0)
MCH: 29.3 pg (ref 26.0–34.0)
MCHC: 33.7 g/dL (ref 32.0–36.0)
MCV: 87 fL (ref 81–101)
MONO#: 0.3 10*3/uL (ref 0.1–0.9)
MONO%: 7.7 % (ref 0.0–13.0)
NEUT#: 2.3 10*3/uL (ref 1.5–6.5)
NEUT%: 55.6 % (ref 39.6–80.0)
Platelets: 146 10*3/uL (ref 145–400)
RBC: 4.78 10*6/uL (ref 3.70–5.32)
RDW: 13.7 % (ref 11.1–15.7)
WBC: 4.1 10*3/uL (ref 3.9–10.0)

## 2012-05-20 LAB — IRON AND TIBC
%SAT: 29 % (ref 20–55)
Iron: 81 ug/dL (ref 42–145)
TIBC: 284 ug/dL (ref 250–470)
UIBC: 203 ug/dL (ref 125–400)

## 2012-05-20 LAB — FERRITIN: Ferritin: 135 ng/mL (ref 10–291)

## 2012-05-20 MED ORDER — CYANOCOBALAMIN 1000 MCG/ML IJ SOLN
1000.0000 ug | Freq: Once | INTRAMUSCULAR | Status: AC
  Administered 2012-05-20: 1000 ug via INTRAMUSCULAR

## 2012-05-20 NOTE — Patient Instructions (Signed)

## 2012-05-25 ENCOUNTER — Telehealth: Payer: Self-pay | Admitting: Hematology & Oncology

## 2012-05-25 NOTE — Telephone Encounter (Addendum)
Message copied by Cathi Roan on Tue May 25, 2012  3:38 PM ------      Message from: [name]      Created: Sun May 23, 2012  9:07 PM       Call - iron is ok!!  Cindee Lame    5-13-  3 :96  Called and spoke to patient regarding above MD message, she was pleased her levels were good.  Lupita Raider  LPN ------

## 2012-06-16 ENCOUNTER — Telehealth: Payer: Self-pay | Admitting: Hematology & Oncology

## 2012-06-16 NOTE — Telephone Encounter (Signed)
Pt called cx 6-5 moved to 7-7. I offered her other dates but she couldn't come in early.

## 2012-06-17 ENCOUNTER — Other Ambulatory Visit: Payer: PRIVATE HEALTH INSURANCE | Admitting: Lab

## 2012-06-17 ENCOUNTER — Ambulatory Visit: Payer: PRIVATE HEALTH INSURANCE

## 2012-06-17 ENCOUNTER — Ambulatory Visit: Payer: PRIVATE HEALTH INSURANCE | Admitting: Hematology & Oncology

## 2012-07-08 ENCOUNTER — Telehealth: Payer: Self-pay | Admitting: Hematology & Oncology

## 2012-07-08 NOTE — Telephone Encounter (Signed)
Mother will let pt know of 7-7 time change

## 2012-07-19 ENCOUNTER — Ambulatory Visit (HOSPITAL_BASED_OUTPATIENT_CLINIC_OR_DEPARTMENT_OTHER): Payer: PRIVATE HEALTH INSURANCE | Admitting: Hematology & Oncology

## 2012-07-19 ENCOUNTER — Telehealth: Payer: Self-pay | Admitting: Family

## 2012-07-19 ENCOUNTER — Other Ambulatory Visit (HOSPITAL_BASED_OUTPATIENT_CLINIC_OR_DEPARTMENT_OTHER): Payer: PRIVATE HEALTH INSURANCE | Admitting: Lab

## 2012-07-19 ENCOUNTER — Ambulatory Visit: Payer: PRIVATE HEALTH INSURANCE

## 2012-07-19 ENCOUNTER — Ambulatory Visit: Payer: PRIVATE HEALTH INSURANCE | Admitting: Hematology & Oncology

## 2012-07-19 ENCOUNTER — Other Ambulatory Visit: Payer: PRIVATE HEALTH INSURANCE | Admitting: Lab

## 2012-07-19 VITALS — BP 119/54 | HR 65 | Temp 98.2°F | Resp 16 | Ht 67.0 in | Wt 294.0 lb

## 2012-07-19 DIAGNOSIS — D510 Vitamin B12 deficiency anemia due to intrinsic factor deficiency: Secondary | ICD-10-CM

## 2012-07-19 DIAGNOSIS — D508 Other iron deficiency anemias: Secondary | ICD-10-CM

## 2012-07-19 DIAGNOSIS — E785 Hyperlipidemia, unspecified: Secondary | ICD-10-CM

## 2012-07-19 LAB — RETICULOCYTES (CHCC)
ABS Retic: 89.3 10*3/uL (ref 19.0–186.0)
RBC.: 4.25 MIL/uL (ref 3.87–5.11)
Retic Ct Pct: 2.1 % (ref 0.4–2.3)

## 2012-07-19 LAB — CBC WITH DIFFERENTIAL (CANCER CENTER ONLY)
BASO#: 0 10*3/uL (ref 0.0–0.2)
BASO%: 0.4 % (ref 0.0–2.0)
EOS%: 3 % (ref 0.0–7.0)
Eosinophils Absolute: 0.2 10*3/uL (ref 0.0–0.5)
HCT: 37.4 % (ref 34.8–46.6)
HGB: 12.6 g/dL (ref 11.6–15.9)
LYMPH#: 1.3 10*3/uL (ref 0.9–3.3)
LYMPH%: 24.3 % (ref 14.0–48.0)
MCH: 29.7 pg (ref 26.0–34.0)
MCHC: 33.7 g/dL (ref 32.0–36.0)
MCV: 88 fL (ref 81–101)
MONO#: 0.4 10*3/uL (ref 0.1–0.9)
MONO%: 6.5 % (ref 0.0–13.0)
NEUT#: 3.5 10*3/uL (ref 1.5–6.5)
NEUT%: 65.8 % (ref 39.6–80.0)
Platelets: 139 10*3/uL — ABNORMAL LOW (ref 145–400)
RBC: 4.24 10*6/uL (ref 3.70–5.32)
RDW: 13.2 % (ref 11.1–15.7)
WBC: 5.4 10*3/uL (ref 3.9–10.0)

## 2012-07-19 LAB — VITAMIN B12: Vitamin B-12: 393 pg/mL (ref 211–911)

## 2012-07-19 LAB — CHCC SATELLITE - SMEAR

## 2012-07-19 MED ORDER — SYNTHROID 200 MCG PO TABS: 200.0000 ug | ORAL_TABLET | Freq: Every day | ORAL | Status: DC

## 2012-07-19 NOTE — Progress Notes (Signed)
This office note has been dictated.

## 2012-07-19 NOTE — Telephone Encounter (Signed)
Rx request to pharmacy/SLS  

## 2012-07-19 NOTE — Telephone Encounter (Signed)
Request for 90 day supply  Synthroid tablet

## 2012-07-20 ENCOUNTER — Ambulatory Visit: Payer: PRIVATE HEALTH INSURANCE

## 2012-07-20 ENCOUNTER — Other Ambulatory Visit: Payer: PRIVATE HEALTH INSURANCE | Admitting: Lab

## 2012-07-20 ENCOUNTER — Telehealth: Payer: Self-pay | Admitting: Hematology & Oncology

## 2012-07-20 LAB — COMPREHENSIVE METABOLIC PANEL
ALT: 17 U/L (ref 0–35)
AST: 22 U/L (ref 0–37)
Albumin: 3.4 g/dL — ABNORMAL LOW (ref 3.5–5.2)
Alkaline Phosphatase: 70 U/L (ref 39–117)
BUN: 7 mg/dL (ref 6–23)
CO2: 27 mEq/L (ref 19–32)
Calcium: 8.6 mg/dL (ref 8.4–10.5)
Chloride: 99 mEq/L (ref 96–112)
Creatinine, Ser: 0.69 mg/dL (ref 0.50–1.10)
Glucose, Bld: 111 mg/dL — ABNORMAL HIGH (ref 70–99)
Potassium: 4.1 mEq/L (ref 3.5–5.3)
Sodium: 133 mEq/L — ABNORMAL LOW (ref 135–145)
Total Bilirubin: 0.3 mg/dL (ref 0.3–1.2)
Total Protein: 6.3 g/dL (ref 6.0–8.3)

## 2012-07-20 LAB — IRON AND TIBC CHCC
%SAT: 26 % (ref 21–57)
Iron: 68 ug/dL (ref 41–142)
TIBC: 258 ug/dL (ref 236–444)
UIBC: 190 ug/dL (ref 120–384)

## 2012-07-20 LAB — FERRITIN CHCC: Ferritin: 90 ng/ml (ref 9–269)

## 2012-07-20 NOTE — Telephone Encounter (Signed)
Pt was here and then canceled due to having to pick up child before 3p. Rescheduled for 7/9 @1p .
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
## 2012-07-21 ENCOUNTER — Ambulatory Visit (HOSPITAL_BASED_OUTPATIENT_CLINIC_OR_DEPARTMENT_OTHER): Payer: PRIVATE HEALTH INSURANCE

## 2012-07-21 ENCOUNTER — Other Ambulatory Visit: Payer: PRIVATE HEALTH INSURANCE | Admitting: Lab

## 2012-07-21 VITALS — BP 99/67 | HR 56 | Temp 98.6°F

## 2012-07-21 DIAGNOSIS — D510 Vitamin B12 deficiency anemia due to intrinsic factor deficiency: Secondary | ICD-10-CM

## 2012-07-21 DIAGNOSIS — D509 Iron deficiency anemia, unspecified: Secondary | ICD-10-CM

## 2012-07-21 MED ORDER — SODIUM CHLORIDE 0.9 % IV SOLN
Freq: Once | INTRAVENOUS | Status: AC
  Administered 2012-07-21: 14:00:00 via INTRAVENOUS

## 2012-07-21 MED ORDER — CYANOCOBALAMIN 1000 MCG/ML IJ SOLN
1000.0000 ug | Freq: Once | INTRAMUSCULAR | Status: AC
  Administered 2012-07-21: 1000 ug via INTRAMUSCULAR

## 2012-07-21 MED ORDER — SODIUM CHLORIDE 0.9 % IV SOLN
1020.0000 mg | Freq: Once | INTRAVENOUS | Status: AC
  Administered 2012-07-21: 1020 mg via INTRAVENOUS
  Filled 2012-07-21: qty 34

## 2012-07-21 NOTE — Patient Instructions (Signed)
Cyanocobalamin, Vitamin B12 injection What is this medicine? CYANOCOBALAMIN (sye an oh koe BAL a min) is a man made form of vitamin B12. Vitamin B12 is used in the growth of healthy blood cells, nerve cells, and proteins in the body. It also helps with the metabolism of fats and carbohydrates. This medicine is used to treat people who can not absorb vitamin B12. This medicine may be used for other purposes; ask your health care provider or pharmacist if you have questions. What should I tell my health care provider before I take this medicine? They need to know if you have any of these conditions: -kidney disease -Leber's disease -megaloblastic anemia -an unusual or allergic reaction to cyanocobalamin, cobalt, other medicines, foods, dyes, or preservatives -pregnant or trying to get pregnant -breast-feeding How should I use this medicine? This medicine is injected into a muscle or deeply under the skin. It is usually given by a health care professional in a clinic or doctor's office. However, your doctor may teach you how to inject yourself. Follow all instructions. Talk to your pediatrician regarding the use of this medicine in children. Special care may be needed. Overdosage: If you think you have taken too much of this medicine contact a poison control center or emergency room at once. NOTE: This medicine is only for you. Do not share this medicine with others. What if I miss a dose? If you are given your dose at a clinic or doctor's office, call to reschedule your appointment. If you give your own injections and you miss a dose, take it as soon as you can. If it is almost time for your next dose, take only that dose. Do not take double or extra doses. What may interact with this medicine? -colchicine -heavy alcohol intake This list may not describe all possible interactions. Give your health care provider a list of all the medicines, herbs, non-prescription drugs, or dietary supplements you  use. Also tell them if you smoke, drink alcohol, or use illegal drugs. Some items may interact with your medicine. What should I watch for while using this medicine? Visit your doctor or health care professional regularly. You may need blood work done while you are taking this medicine. You may need to follow a special diet. Talk to your doctor. Limit your alcohol intake and avoid smoking to get the best benefit. What side effects may I notice from receiving this medicine? Side effects that you should report to your doctor or health care professional as soon as possible: -allergic reactions like skin rash, itching or hives, swelling of the face, lips, or tongue -blue tint to skin -chest tightness, pain -difficulty breathing, wheezing -dizziness -red, swollen painful area on the leg Side effects that usually do not require medical attention (report to your doctor or health care professional if they continue or are bothersome): -diarrhea -headache This list may not describe all possible side effects. Call your doctor for medical advice about side effects. You may report side effects to FDA at 1-800-FDA-1088. Where should I keep my medicine? Keep out of the reach of children. Store at room temperature between 15 and 30 degrees C (59 and 85 degrees F). Protect from light. Throw away any unused medicine after the expiration date. NOTE: This sheet is a summary. It may not cover all possible information. If you have questions about this medicine, talk to your doctor, pharmacist, or health care provider.  2012, Elsevier/Gold Standard. (04/12/2007 10:10:20 PM)Ferumoxytol injection What is this medicine? FERUMOXYTOL is  an iron complex. Iron is used to make healthy red blood cells, which carry oxygen and nutrients throughout the body. This medicine is used to treat iron deficiency anemia in people with chronic kidney disease. This medicine may be used for other purposes; ask your health care provider or  pharmacist if you have questions. What should I tell my health care provider before I take this medicine? They need to know if you have any of these conditions: -anemia not caused by low iron levels -high levels of iron in the blood -magnetic resonance imaging (MRI) test scheduled -an unusual or allergic reaction to iron, other medicines, foods, dyes, or preservatives -pregnant or trying to get pregnant -breast-feeding How should I use this medicine? This medicine is for infusion into a vein. It is given by a health care professional in a hospital or clinic setting. Talk to your pediatrician regarding the use of this medicine in children. Special care may be needed. Overdosage: If you think you've taken too much of this medicine contact a poison control center or emergency room at once. Overdosage: If you think you have taken too much of this medicine contact a poison control center or emergency room at once. NOTE: This medicine is only for you. Do not share this medicine with others. What if I miss a dose? It is important not to miss your dose. Call your doctor or health care professional if you are unable to keep an appointment. What may interact with this medicine? This medicine may interact with the following medications: -other iron products This list may not describe all possible interactions. Give your health care provider a list of all the medicines, herbs, non-prescription drugs, or dietary supplements you use. Also tell them if you smoke, drink alcohol, or use illegal drugs. Some items may interact with your medicine. What should I watch for while using this medicine? Visit your doctor or healthcare professional regularly. Tell your doctor or healthcare professional if your symptoms do not start to get better or if they get worse. You may need blood work done while you are taking this medicine. You may need to follow a special diet. Talk to your doctor. Foods that contain iron  include: whole grains/cereals, dried fruits, beans, or peas, leafy green vegetables, and organ meats (liver, kidney). What side effects may I notice from receiving this medicine? Side effects that you should report to your doctor or health care professional as soon as possible: -allergic reactions like skin rash, itching or hives, swelling of the face, lips, or tongue -breathing problems -changes in blood pressure -feeling faint or lightheaded, falls -fever or chills -flushing, sweating, or hot feelings -swelling of the ankles or feet Side effects that usually do not require medical attention (Report these to your doctor or health care professional if they continue or are bothersome.): -diarrhea -headache -nausea, vomiting -stomach pain This list may not describe all possible side effects. Call your doctor for medical advice about side effects. You may report side effects to FDA at 1-800-FDA-1088. Where should I keep my medicine? This drug is given in a hospital or clinic and will not be stored at home. NOTE: This sheet is a summary. It may not cover all possible information. If you have questions about this medicine, talk to your doctor, pharmacist, or health care provider.  2013, Elsevier/Gold Standard. (09/22/2007 9:48:25 PM)

## 2012-07-28 ENCOUNTER — Ambulatory Visit: Payer: PRIVATE HEALTH INSURANCE | Admitting: Family

## 2012-07-31 ENCOUNTER — Encounter: Payer: Self-pay | Admitting: Cardiovascular Disease

## 2012-08-02 ENCOUNTER — Encounter: Payer: Self-pay | Admitting: Family

## 2012-08-02 ENCOUNTER — Ambulatory Visit (INDEPENDENT_AMBULATORY_CARE_PROVIDER_SITE_OTHER): Payer: PRIVATE HEALTH INSURANCE | Admitting: Family

## 2012-08-02 VITALS — BP 118/80 | HR 62 | Temp 97.8°F | Resp 18 | Ht 67.0 in | Wt 299.0 lb

## 2012-08-02 DIAGNOSIS — IMO0001 Reserved for inherently not codable concepts without codable children: Secondary | ICD-10-CM

## 2012-08-02 DIAGNOSIS — K7689 Other specified diseases of liver: Secondary | ICD-10-CM

## 2012-08-02 DIAGNOSIS — E039 Hypothyroidism, unspecified: Secondary | ICD-10-CM

## 2012-08-02 DIAGNOSIS — L659 Nonscarring hair loss, unspecified: Secondary | ICD-10-CM

## 2012-08-02 DIAGNOSIS — M7918 Myalgia, other site: Secondary | ICD-10-CM

## 2012-08-02 LAB — HEPATIC FUNCTION PANEL
ALT: 28 U/L (ref 0–35)
AST: 30 U/L (ref 0–37)
Albumin: 3.4 g/dL — ABNORMAL LOW (ref 3.5–5.2)
Alkaline Phosphatase: 65 U/L (ref 39–117)
Bilirubin, Direct: 0.1 mg/dL (ref 0.0–0.3)
Indirect Bilirubin: 0.3 mg/dL (ref 0.0–0.9)
Total Bilirubin: 0.4 mg/dL (ref 0.3–1.2)
Total Protein: 6.3 g/dL (ref 6.0–8.3)

## 2012-08-02 LAB — TSH: TSH: 0.998 u[IU]/mL (ref 0.350–4.500)
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
## 2012-08-02 NOTE — Patient Instructions (Addendum)
Please complete lab work prior to leaving. Follow up in 2 months.  

## 2012-08-04 ENCOUNTER — Encounter: Payer: Self-pay | Admitting: Family

## 2012-08-05 DIAGNOSIS — M7918 Myalgia, other site: Secondary | ICD-10-CM | POA: Insufficient documentation

## 2012-08-05 DIAGNOSIS — L659 Nonscarring hair loss, unspecified: Secondary | ICD-10-CM | POA: Insufficient documentation

## 2012-08-05 NOTE — Assessment & Plan Note (Addendum)
Will check TSH. CBC last week was normal.

## 2012-08-05 NOTE — Assessment & Plan Note (Signed)
Pain is most consistent with musculoskeletal pain.  She has been weaning down on her narcotic doses with pain management.  Due to hx of autoimmune hepatitis- will check LFT's. She is s/p cholecystectomy.

## 2012-08-09 ENCOUNTER — Telehealth: Payer: Self-pay | Admitting: *Deleted

## 2012-08-09 MED ORDER — DULOXETINE HCL 30 MG PO CPEP: ORAL_CAPSULE | ORAL | Status: DC

## 2012-08-09 MED ORDER — SYNTHROID 200 MCG PO TABS: 200.0000 ug | ORAL_TABLET | Freq: Every day | ORAL | Status: DC

## 2012-08-09 NOTE — Telephone Encounter (Signed)
Pt left message wanting to know if her thyroid dose will need to be adjusted. States she needs refill on levothyroxine. Also states that she would like to proceed with Cymbalta.  Please advise.

## 2012-08-09 NOTE — Telephone Encounter (Signed)
Attempted to reach pt , line busy

## 2012-08-09 NOTE — Telephone Encounter (Signed)
Continue current dose of synthroid. Refill sent.  Rx also sent for cymbalta. She should follow up in 4-6 weeks so we can see how she is doing on cymbalta.

## 2012-08-10 NOTE — Telephone Encounter (Signed)
Attempted to reach pt , line busy

## 2012-08-11 NOTE — Telephone Encounter (Signed)
Noted  

## 2012-08-11 NOTE — Telephone Encounter (Signed)
Notified pt and scheduled follow up for 09/17/12 at 2:15. Pt wanted to let you know that she has joined the [employer] and ins. Will cover it for 1 yr. Reports that she continues to have pain in the area of her liver. Does not feel it is worsening but will let us know if it does.

## 2012-08-12 ENCOUNTER — Telehealth: Payer: Self-pay | Admitting: Family

## 2012-08-12 DIAGNOSIS — N920 Excessive and frequent menstruation with regular cycle: Secondary | ICD-10-CM

## 2012-08-12 NOTE — Telephone Encounter (Signed)
Caller: Murlene/Patient; Phone: [phone]; Reason for Call: Pt was prescribed Cymbalta for Fibromyalgia - has taken two doses and began having headaches, light headed, blurred vision.  Did not take any doses today and has no symptoms.  Pt has had history of borderline diabetes and has been able to control with diet.  Pt states sxs felt like when her blood sugar was up in past.  Pt looked up side effects and states to do not take Cymbalta if pt has diabetes.  Pt wanted to make [name], NP aware of what happened and wants to know if she should come in for a fasting blood sugar since this has happened and she hasn't had one done recently.  Please follow up with pt.

## 2012-08-12 NOTE — Telephone Encounter (Signed)
Sugar looked ok earlier this month.  She can stop cymbalta as it does not seem that she is tolerating.

## 2012-08-13 NOTE — Telephone Encounter (Signed)
Left message on voicemail to return my call.  

## 2012-08-13 NOTE — Telephone Encounter (Signed)
Pt left message returning my call. Attempted to reach pt, will try again Monday.

## 2012-08-16 NOTE — Telephone Encounter (Signed)
No answer, will try again later.

## 2012-08-18 NOTE — Telephone Encounter (Signed)
Please call pt and let her know that I would like her to come to the lab to check CBC (blood count) given prolonged menses.  Also, does she have a GYN.  If not I would like to refer her to GYN for her abnormal menstrual cycles.

## 2012-08-18 NOTE — Telephone Encounter (Signed)
Notified pt. She states her headaches have stopped since discontinuing Cymbalta. Pt notes that she continues to have a menstrual cycle (bright red bleeding) x 4 weeks but seems to be lessening now but not stopping. Feels drained. Please advise.

## 2012-08-18 NOTE — Telephone Encounter (Signed)
Please see note below, pt also states that she had to get another iron infusion about 3 weeks ago. Please advise.

## 2012-08-19 NOTE — Telephone Encounter (Signed)
Attempted to reach pt and left message on home # to return my call. 

## 2012-08-23 NOTE — Telephone Encounter (Signed)
Notified pt and she is agreeable to GYN referral. Does not want to see an [facility] Physician.

## 2012-09-16 ENCOUNTER — Telehealth: Payer: Self-pay | Admitting: Hematology & Oncology

## 2012-09-16 NOTE — Telephone Encounter (Signed)
Pt left message to cx 9-8 and she wanted to reschedule. I tried calling pt but her phone is constantly busy. I tried calling contact no answer or vm. I cx 9-8 but did not reschedule appointment

## 2012-09-17 ENCOUNTER — Ambulatory Visit: Payer: PRIVATE HEALTH INSURANCE | Admitting: Family

## 2012-09-20 ENCOUNTER — Other Ambulatory Visit: Payer: PRIVATE HEALTH INSURANCE | Admitting: Lab

## 2012-09-20 ENCOUNTER — Ambulatory Visit: Payer: PRIVATE HEALTH INSURANCE | Admitting: Hematology & Oncology

## 2012-09-29 ENCOUNTER — Telehealth: Payer: Self-pay | Admitting: Hematology & Oncology

## 2012-09-29 ENCOUNTER — Ambulatory Visit: Payer: PRIVATE HEALTH INSURANCE | Admitting: Family

## 2012-09-29 NOTE — Telephone Encounter (Signed)
Pt left message wanting to schedule appointment. I called back no answer or voice mail. I left message on mom's cell phone number.

## 2012-10-08 ENCOUNTER — Ambulatory Visit (INDEPENDENT_AMBULATORY_CARE_PROVIDER_SITE_OTHER): Payer: PRIVATE HEALTH INSURANCE | Admitting: Family

## 2012-10-08 ENCOUNTER — Encounter: Payer: Self-pay | Admitting: Family

## 2012-10-08 ENCOUNTER — Ambulatory Visit: Payer: PRIVATE HEALTH INSURANCE | Admitting: Family

## 2012-10-08 VITALS — BP 120/80 | HR 55 | Temp 97.8°F | Resp 16 | Ht 67.0 in | Wt 306.0 lb

## 2012-10-08 DIAGNOSIS — N926 Irregular menstruation, unspecified: Secondary | ICD-10-CM

## 2012-10-08 DIAGNOSIS — Z23 Encounter for immunization: Secondary | ICD-10-CM

## 2012-10-08 DIAGNOSIS — I10 Essential (primary) hypertension: Secondary | ICD-10-CM

## 2012-10-08 DIAGNOSIS — N939 Abnormal uterine and vaginal bleeding, unspecified: Secondary | ICD-10-CM

## 2012-10-08 DIAGNOSIS — IMO0001 Reserved for inherently not codable concepts without codable children: Secondary | ICD-10-CM

## 2012-10-08 DIAGNOSIS — E039 Hypothyroidism, unspecified: Secondary | ICD-10-CM

## 2012-10-08 DIAGNOSIS — M7918 Myalgia, other site: Secondary | ICD-10-CM

## 2012-10-08 LAB — CBC WITH DIFFERENTIAL/PLATELET
Basophils Absolute: 0 10*3/uL (ref 0.0–0.1)
Basophils Relative: 0 % (ref 0–1)
Eosinophils Absolute: 0.3 10*3/uL (ref 0.0–0.7)
Eosinophils Relative: 5 % (ref 0–5)
HCT: 38.7 % (ref 36.0–46.0)
Hemoglobin: 13.5 g/dL (ref 12.0–15.0)
Lymphocytes Relative: 28 % (ref 12–46)
Lymphs Abs: 1.4 10*3/uL (ref 0.7–4.0)
MCH: 29 pg (ref 26.0–34.0)
MCHC: 34.9 g/dL (ref 30.0–36.0)
MCV: 83.2 fL (ref 78.0–100.0)
Monocytes Absolute: 0.4 10*3/uL (ref 0.1–1.0)
Monocytes Relative: 8 % (ref 3–12)
Neutro Abs: 2.8 10*3/uL (ref 1.7–7.7)
Neutrophils Relative %: 59 % (ref 43–77)
Platelets: 164 10*3/uL (ref 150–400)
RBC: 4.65 MIL/uL (ref 3.87–5.11)
RDW: 13.3 % (ref 11.5–15.5)
WBC: 4.9 10*3/uL (ref 4.0–10.5)

## 2012-10-08 NOTE — Assessment & Plan Note (Signed)
BP Readings from Last 3 Encounters:  10/08/12 120/80  08/02/12 118/80  07/21/12 99/67   BP is stable on current meds. Continue same.
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
## 2012-10-08 NOTE — Patient Instructions (Addendum)
Please complete your lab work prior to leaving. Follow up in 3 months.   

## 2012-10-08 NOTE — Assessment & Plan Note (Signed)
tsh normal 2 months ago. Clinically stable. Continue current dose of synthroid.

## 2012-10-08 NOTE — Assessment & Plan Note (Signed)
Some improvement. Defer management to pain clinic.

## 2012-10-11 ENCOUNTER — Encounter: Payer: Self-pay | Admitting: Family

## 2012-10-20 ENCOUNTER — Other Ambulatory Visit: Payer: Self-pay | Admitting: Neurosurgery

## 2012-10-20 DIAGNOSIS — M5416 Radiculopathy, lumbar region: Secondary | ICD-10-CM

## 2012-10-29 ENCOUNTER — Ambulatory Visit
Admission: RE | Admit: 2012-10-29 | Discharge: 2012-10-29 | Disposition: A | Discharge status: Home / Self Care | Payer: PRIVATE HEALTH INSURANCE | Source: Ambulatory Visit | Attending: Neurosurgery | Admitting: Neurosurgery

## 2012-10-29 DIAGNOSIS — M5416 Radiculopathy, lumbar region: Secondary | ICD-10-CM

## 2012-10-29 MED ORDER — GADOBENATE DIMEGLUMINE 529 MG/ML IV SOLN
20.0000 mL | Freq: Once | INTRAVENOUS | Status: AC | PRN
  Administered 2012-10-29: 20 mL via INTRAVENOUS

## 2012-11-08 ENCOUNTER — Other Ambulatory Visit: Payer: Self-pay | Admitting: *Deleted

## 2012-11-08 MED ORDER — METOPROLOL TARTRATE 25 MG PO TABS: 25.0000 mg | ORAL_TABLET | Freq: Two times a day (BID) | ORAL | Status: DC

## 2012-11-08 NOTE — Telephone Encounter (Signed)
Rx was sent to pharmacy electronically. 

## 2012-11-17 ENCOUNTER — Ambulatory Visit: Payer: PRIVATE HEALTH INSURANCE | Admitting: Hematology & Oncology

## 2012-11-17 ENCOUNTER — Other Ambulatory Visit: Payer: PRIVATE HEALTH INSURANCE | Admitting: Lab

## 2012-12-02 ENCOUNTER — Telehealth: Payer: Self-pay | Admitting: *Deleted

## 2012-12-02 DIAGNOSIS — E785 Hyperlipidemia, unspecified: Secondary | ICD-10-CM

## 2012-12-02 DIAGNOSIS — R739 Hyperglycemia, unspecified: Secondary | ICD-10-CM

## 2012-12-02 NOTE — Telephone Encounter (Signed)
I have called patient on behalf of [facility] [place] closure project) to ask that she have a microalbumin and LDL completed. Patient has an appointment on 01-03-13 with Dr Peggyann Juba. She also has a lab appointment on 12/20/12 for the cancer center. She would like to have these two tests completed the same day as her lab appointment if possible. I have asked that she contact [place] to find out what times she would be able to come for labs. She verbalizes understanding. Dr Peggyann Juba, would it be possible for you to place orders for the patient to have the urine microalbumin and LDL completed? Thank you for your help!

## 2012-12-02 NOTE — Telephone Encounter (Signed)
Nicki [place], please see labs.  Could you contact pt? thanks

## 2012-12-03 NOTE — Telephone Encounter (Signed)
Attempted to reach pt , line busy

## 2012-12-03 NOTE — Telephone Encounter (Signed)
Attempted to reach pt, no answer, no voice mail

## 2012-12-05 ENCOUNTER — Other Ambulatory Visit: Payer: Self-pay | Admitting: Family

## 2012-12-06 NOTE — Telephone Encounter (Signed)
Medication Detail      Disp Refills Start End     SYNTHROID 200 MCG tablet 90 tablet 1 08/09/2012     Sig - Route: Take 1 tablet (200 mcg total) by mouth daily. - Oral    E-Prescribing Status: Receipt confirmed by pharmacy (08/09/2012 4:23 PM EDT)

## 2012-12-07 NOTE — Telephone Encounter (Signed)
Attempted to reach pt , line busy

## 2012-12-08 ENCOUNTER — Telehealth: Payer: Self-pay | Admitting: *Deleted

## 2012-12-08 NOTE — Telephone Encounter (Signed)
Attempted to reach pt again and line is busy. Mailed letter to contact office. Closing encounter.

## 2012-12-08 NOTE — Telephone Encounter (Signed)
Letter mailed to pt.  

## 2012-12-15 ENCOUNTER — Telehealth: Payer: Self-pay | Admitting: *Deleted

## 2012-12-15 NOTE — Telephone Encounter (Signed)
Spoke with pt. She states she will have labs done at [facility] in our office after she sees Dr Myna Hidalgo on 12/20/12.

## 2012-12-15 NOTE — Telephone Encounter (Signed)
Spoke with pt and scheduled appt for 12/20/12 at 1:30pm.

## 2012-12-15 NOTE — Telephone Encounter (Signed)
Lets make 12/8 a provider visit please. We will check labs at visit.

## 2012-12-15 NOTE — Telephone Encounter (Signed)
Received call from pt. She states she continues to have pressure and burning sensation around her liver. Saw specialist last week and was told she had some fluid in her arms. Pt wants to know if she can have her liver enzymes checked when she comes in for nurse visit on 12/20/12?  Please advise.

## 2012-12-20 ENCOUNTER — Encounter: Payer: Self-pay | Admitting: Family

## 2012-12-20 ENCOUNTER — Ambulatory Visit (HOSPITAL_BASED_OUTPATIENT_CLINIC_OR_DEPARTMENT_OTHER): Payer: PRIVATE HEALTH INSURANCE | Admitting: Hematology & Oncology

## 2012-12-20 ENCOUNTER — Ambulatory Visit (HOSPITAL_BASED_OUTPATIENT_CLINIC_OR_DEPARTMENT_OTHER): Payer: PRIVATE HEALTH INSURANCE

## 2012-12-20 ENCOUNTER — Ambulatory Visit (INDEPENDENT_AMBULATORY_CARE_PROVIDER_SITE_OTHER): Payer: PRIVATE HEALTH INSURANCE | Admitting: Family

## 2012-12-20 ENCOUNTER — Other Ambulatory Visit (HOSPITAL_BASED_OUTPATIENT_CLINIC_OR_DEPARTMENT_OTHER): Payer: PRIVATE HEALTH INSURANCE | Admitting: Lab

## 2012-12-20 VITALS — BP 140/88 | HR 80 | Temp 98.0°F | Resp 20 | Ht 67.0 in

## 2012-12-20 VITALS — BP 133/69 | HR 52 | Temp 98.0°F | Resp 14

## 2012-12-20 DIAGNOSIS — D509 Iron deficiency anemia, unspecified: Secondary | ICD-10-CM

## 2012-12-20 DIAGNOSIS — D510 Vitamin B12 deficiency anemia due to intrinsic factor deficiency: Secondary | ICD-10-CM

## 2012-12-20 DIAGNOSIS — D508 Other iron deficiency anemias: Secondary | ICD-10-CM

## 2012-12-20 DIAGNOSIS — E785 Hyperlipidemia, unspecified: Secondary | ICD-10-CM

## 2012-12-20 DIAGNOSIS — D649 Anemia, unspecified: Secondary | ICD-10-CM

## 2012-12-20 DIAGNOSIS — M7918 Myalgia, other site: Secondary | ICD-10-CM

## 2012-12-20 DIAGNOSIS — R0781 Pleurodynia: Secondary | ICD-10-CM

## 2012-12-20 DIAGNOSIS — IMO0001 Reserved for inherently not codable concepts without codable children: Secondary | ICD-10-CM

## 2012-12-20 DIAGNOSIS — R635 Abnormal weight gain: Secondary | ICD-10-CM

## 2012-12-20 DIAGNOSIS — K754 Autoimmune hepatitis: Secondary | ICD-10-CM

## 2012-12-20 DIAGNOSIS — R079 Chest pain, unspecified: Secondary | ICD-10-CM

## 2012-12-20 LAB — CBC WITH DIFFERENTIAL (CANCER CENTER ONLY)
BASO#: 0 10*3/uL (ref 0.0–0.2)
BASO%: 0.2 % (ref 0.0–2.0)
EOS%: 3.8 % (ref 0.0–7.0)
Eosinophils Absolute: 0.2 10*3/uL (ref 0.0–0.5)
HCT: 38.4 % (ref 34.8–46.6)
HGB: 12.9 g/dL (ref 11.6–15.9)
LYMPH#: 1.3 10*3/uL (ref 0.9–3.3)
LYMPH%: 26.4 % (ref 14.0–48.0)
MCH: 29.1 pg (ref 26.0–34.0)
MCHC: 33.6 g/dL (ref 32.0–36.0)
MCV: 87 fL (ref 81–101)
MONO#: 0.5 10*3/uL (ref 0.1–0.9)
MONO%: 9.1 % (ref 0.0–13.0)
NEUT#: 3 10*3/uL (ref 1.5–6.5)
NEUT%: 60.5 % (ref 39.6–80.0)
Platelets: 124 10*3/uL — ABNORMAL LOW (ref 145–400)
RBC: 4.44 10*6/uL (ref 3.70–5.32)
RDW: 13.4 % (ref 11.1–15.7)
WBC: 5 10*3/uL (ref 3.9–10.0)

## 2012-12-20 MED ORDER — CYANOCOBALAMIN 1000 MCG/ML IJ SOLN
INTRAMUSCULAR | Status: AC
  Filled 2012-12-20: qty 1

## 2012-12-20 MED ORDER — SYNTHROID 200 MCG PO TABS: 200.0000 ug | ORAL_TABLET | Freq: Every day | ORAL | Status: DC

## 2012-12-20 MED ORDER — CYANOCOBALAMIN 1000 MCG/ML IJ SOLN
1000.0000 ug | Freq: Once | INTRAMUSCULAR | Status: AC
  Administered 2012-12-20: 1000 ug via INTRAMUSCULAR

## 2012-12-20 NOTE — Progress Notes (Signed)
This office note has been dictated.

## 2012-12-20 NOTE — Assessment & Plan Note (Signed)
Pain overlying right anterior rib cage is most consistent with musculoskeletal pain. Will obtain x ray of the thoracic spine to evaluate for thoracic DDD.

## 2012-12-20 NOTE — Patient Instructions (Addendum)
Please complete lab work prior to leaving and x ray on the first floor in imaging. Stop all sweet tea. Keep calories 1200mg  a day. Count calories using a free program on your computer such as My fitness Pal.   Try to get regular exercise- 30 minutes 5 days a week is the goal. Start slow and add a few minutes each day until you work up to 30 minutes 5 days a week. Follow up in 2 months. Sooner if problems or concerns.

## 2012-12-20 NOTE — Assessment & Plan Note (Addendum)
Advised pt to add regular exercise. Work up slowly. D/c sweet tea, count calories, keep <1200 a day.  Goal weight loss 1 pound a week.
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
## 2012-12-20 NOTE — Assessment & Plan Note (Signed)
Obtain follow up LFT.

## 2012-12-20 NOTE — Patient Instructions (Signed)
Cyanocobalamin, Vitamin B12 injection °What is this medicine? °CYANOCOBALAMIN (sye an oh koe BAL a min) is a man made form of vitamin B12. Vitamin B12 is used in the growth of healthy blood cells, nerve cells, and proteins in the body. It also helps with the metabolism of fats and carbohydrates. This medicine is used to treat people who can not absorb vitamin B12. °This medicine may be used for other purposes; ask your health care provider or pharmacist if you have questions. °COMMON BRAND NAME(S): Cyomin, LA-12 , Nutri-Twelve , Primabalt °What should I tell my health care provider before I take this medicine? °They need to know if you have any of these conditions: °-kidney disease °-Leber's disease °-megaloblastic anemia °-an unusual or allergic reaction to cyanocobalamin, cobalt, other medicines, foods, dyes, or preservatives °-pregnant or trying to get pregnant °-breast-feeding °How should I use this medicine? °This medicine is injected into a muscle or deeply under the skin. It is usually given by a health care professional in a clinic or doctor's office. However, your doctor may teach you how to inject yourself. Follow all instructions. °Talk to your pediatrician regarding the use of this medicine in children. Special care may be needed. °Overdosage: If you think you have taken too much of this medicine contact a poison control center or emergency room at once. °NOTE: This medicine is only for you. Do not share this medicine with others. °What if I miss a dose? °If you are given your dose at a clinic or doctor's office, call to reschedule your appointment. If you give your own injections and you miss a dose, take it as soon as you can. If it is almost time for your next dose, take only that dose. Do not take double or extra doses. °What may interact with this medicine? °-colchicine °-heavy alcohol intake °This list may not describe all possible interactions. Give your health care provider a list of all the  medicines, herbs, non-prescription drugs, or dietary supplements you use. Also tell them if you smoke, drink alcohol, or use illegal drugs. Some items may interact with your medicine. °What should I watch for while using this medicine? °Visit your doctor or health care professional regularly. You may need blood work done while you are taking this medicine. °You may need to follow a special diet. Talk to your doctor. Limit your alcohol intake and avoid smoking to get the best benefit. °What side effects may I notice from receiving this medicine? °Side effects that you should report to your doctor or health care professional as soon as possible: °-allergic reactions like skin rash, itching or hives, swelling of the face, lips, or tongue °-blue tint to skin °-chest tightness, pain °-difficulty breathing, wheezing °-dizziness °-red, swollen painful area on the leg °Side effects that usually do not require medical attention (report to your doctor or health care professional if they continue or are bothersome): °-diarrhea °-headache °This list may not describe all possible side effects. Call your doctor for medical advice about side effects. You may report side effects to FDA at 1-800-FDA-1088. °Where should I keep my medicine? °Keep out of the reach of children. °Store at room temperature between 15 and 30 degrees C (59 and 85 degrees F). Protect from light. Throw away any unused medicine after the expiration date. °NOTE: This sheet is a summary. It may not cover all possible information. If you have questions about this medicine, talk to your doctor, pharmacist, or health care provider. °© 2014, Elsevier/Gold Standard. (2007-04-12   22:10:20) ° °

## 2012-12-21 ENCOUNTER — Encounter: Payer: Self-pay | Admitting: Family

## 2012-12-21 ENCOUNTER — Telehealth: Payer: Self-pay | Admitting: Hematology & Oncology

## 2012-12-21 LAB — HEPATIC FUNCTION PANEL
ALT: 26 U/L (ref 0–35)
AST: 33 U/L (ref 0–37)
Albumin: 3.7 g/dL (ref 3.5–5.2)
Alkaline Phosphatase: 75 U/L (ref 39–117)
Bilirubin, Direct: 0.1 mg/dL (ref 0.0–0.3)
Indirect Bilirubin: 0.5 mg/dL (ref 0.0–0.9)
Total Bilirubin: 0.6 mg/dL (ref 0.3–1.2)
Total Protein: 6.7 g/dL (ref 6.0–8.3)

## 2012-12-21 LAB — IRON AND TIBC CHCC
%SAT: 27 % (ref 21–57)
Iron: 73 ug/dL (ref 41–142)
TIBC: 273 ug/dL (ref 236–444)
UIBC: 199 ug/dL (ref 120–384)

## 2012-12-21 LAB — FERRITIN CHCC: Ferritin: 105 ng/ml (ref 9–269)

## 2012-12-21 NOTE — Telephone Encounter (Signed)
Mailed 03-2013 schedule °

## 2012-12-22 ENCOUNTER — Telehealth: Payer: Self-pay | Admitting: Nurse Practitioner

## 2012-12-22 NOTE — Progress Notes (Signed)
DIAGNOSES: 1. Recurrent iron-deficiency anemia. 2. Menometrorrhagia. 3. Morbid obesity.  CURRENT THERAPY:  IV iron as indicated.  INTERIM HISTORY:  Megan [name] comes in for followup.  She is doing pretty well right now.  She is actually going back to school.  She is enjoying this.  She really wants to have a gastric banding procedure.  She really thinks that this is going to help with respect to her weight and overall health.  I certainly agree with this.  I do not see any reason why she could not have it done from a hematologic point of view.  She last got iron back in July.  At that point in time, her iron studies showed a ferritin of 90, but iron saturation of only 26%.  We also checked her vitamin B12 and it was 393.  She still feels tired.  I am not sure as to why she does feel tired.  We will check her iron stores again.  She has had no problems with respect to bleeding.  She, I think, is having her cycles, though it is somewhat decreased.  She has had no fever.  There have been no rashes.  She has had no cough.  PHYSICAL EXAMINATION:  General:  This is a morbidly obese white female, in no obvious distress.  Vital signs show temperature of 98, pulse 52, respiratory rate 14, blood pressure 133/69.  She refused to have her weight taken.  Head and Neck:  No ocular or oral lesions.  There are no palpable cervical or supraclavicular lymph nodes.  She has no glossitis. Lungs:  Clear bilaterally.  Cardiac:  Regular rate and rhythm with a normal S1, S2.  There are no murmurs, rubs, or bruits.  Abdomen:  Soft. Her abdomen is obese.  There is no fluid wave.  There is no guarding or rebound tenderness.  There is no palpable hepatosplenomegaly.  Back:  No tenderness over the spine, ribs, or hips.  She just has a laminectomy scar that is healed in the lumbar spine.  Extremities:  Some chronic nonpitting edema in the lower legs.  She may have some slight stasis dermatitis changes.   Neurological:  No focal neurological deficits.  LABORATORY STUDIES:  White cell count is 5, hemoglobin is 13, hematocrit 38, platelet count 124.  MCV is 87.  IMPRESSION:  Megan [name] is a very nice 47 year old white female.  She has recurrent iron-deficiency anemia.  She has history of menometrorrhagia. She is getting close to the change of life.  Hopefully, her monthly cycles are slowing down.  We will see what her iron studies show.  I will plan to get her back in another 3-4 months.  Again, I do not see any problems from my point of view with her having a gastric procedure for her weight loss.    ______________________________ [name], [name] PRE/MEDQ  D:  12/20/2012  T:  12/21/2012  Job:  [id number]

## 2012-12-22 NOTE — Telephone Encounter (Addendum)
Message copied by Glee Arvin on Wed Dec 22, 2012 11:12 AM ------      Message from: [name]      Created: Tue Dec 21, 2012  9:16 PM       Call - iron is ok!!  Cindee Lame ------lvm on pt's personal machine and informed her to contact the office if she had any further questions.

## 2013-01-03 ENCOUNTER — Ambulatory Visit: Payer: PRIVATE HEALTH INSURANCE | Admitting: Family

## 2013-01-18 ENCOUNTER — Ambulatory Visit (HOSPITAL_BASED_OUTPATIENT_CLINIC_OR_DEPARTMENT_OTHER)
Admission: RE | Admit: 2013-01-18 | Discharge: 2013-01-18 | Disposition: A | Discharge status: Home / Self Care | Payer: PRIVATE HEALTH INSURANCE | Source: Ambulatory Visit | Attending: Family | Admitting: Family

## 2013-01-18 DIAGNOSIS — R0789 Other chest pain: Secondary | ICD-10-CM | POA: Insufficient documentation

## 2013-01-18 DIAGNOSIS — R0781 Pleurodynia: Secondary | ICD-10-CM

## 2013-01-18 DIAGNOSIS — M549 Dorsalgia, unspecified: Secondary | ICD-10-CM | POA: Insufficient documentation

## 2013-02-09 ENCOUNTER — Encounter: Payer: Self-pay | Admitting: *Deleted

## 2013-02-17 ENCOUNTER — Other Ambulatory Visit: Payer: Self-pay | Admitting: Family

## 2013-02-21 ENCOUNTER — Ambulatory Visit (INDEPENDENT_AMBULATORY_CARE_PROVIDER_SITE_OTHER): Payer: PRIVATE HEALTH INSURANCE | Admitting: Family

## 2013-02-21 VITALS — Ht 67.0 in

## 2013-02-21 DIAGNOSIS — D649 Anemia, unspecified: Secondary | ICD-10-CM

## 2013-02-22 NOTE — Progress Notes (Signed)
   Subjective:    Patient ID: Megan [name], female    DOB: 09/08/1965, 48 y.o.   MRN: [id number]  HPI    Review of Systems     Objective:   Physical Exam        Assessment & Plan:  Pt not seen today.

## 2013-02-23 ENCOUNTER — Ambulatory Visit (INDEPENDENT_AMBULATORY_CARE_PROVIDER_SITE_OTHER): Payer: PRIVATE HEALTH INSURANCE | Admitting: Family

## 2013-02-23 ENCOUNTER — Encounter: Payer: Self-pay | Admitting: Family

## 2013-02-23 VITALS — BP 150/100 | HR 60 | Temp 97.8°F | Resp 18 | Ht 67.0 in

## 2013-02-23 DIAGNOSIS — K3184 Gastroparesis: Secondary | ICD-10-CM

## 2013-02-23 DIAGNOSIS — Z8719 Personal history of other diseases of the digestive system: Secondary | ICD-10-CM

## 2013-02-23 DIAGNOSIS — G894 Chronic pain syndrome: Secondary | ICD-10-CM

## 2013-02-23 DIAGNOSIS — I10 Essential (primary) hypertension: Secondary | ICD-10-CM

## 2013-02-23 MED ORDER — LISINOPRIL 10 MG PO TABS: 10.0000 mg | ORAL_TABLET | Freq: Every day | ORAL | Status: DC

## 2013-02-23 NOTE — Progress Notes (Signed)
Pre visit review using our clinic review tool, if applicable. No additional management support is needed unless otherwise documented below in the visit note. 
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
## 2013-02-23 NOTE — Assessment & Plan Note (Signed)
Advised pt to arrange follow up appointment with Dr. Benson Norway.

## 2013-02-23 NOTE — Patient Instructions (Signed)
Restart lisinopril 10mg  once daily. Follow up in 1 month.

## 2013-02-23 NOTE — Assessment & Plan Note (Signed)
Uncontrolled. She has been on lisinopril in the past.  Resume lisinopril 10mg  once daily, follow up in 1 month for bmet and bp check.

## 2013-02-23 NOTE — Assessment & Plan Note (Signed)
Related to fibromyalgia, chronic back pain.  She is maintained on oxycodone.  She follows with Dr. Hardin Negus for pain management.

## 2013-02-24 ENCOUNTER — Telehealth: Payer: Self-pay | Admitting: Family

## 2013-02-24 NOTE — Telephone Encounter (Signed)
Relevant patient education mailed to patient.  

## 2013-03-02 ENCOUNTER — Telehealth: Payer: Self-pay | Admitting: Hematology & Oncology

## 2013-03-02 NOTE — Telephone Encounter (Signed)
[place] Documentation of Medical Condition Verification Form complete and faxed today to:  Fara Chute [phone]   [place] SCANNED

## 2013-03-03 ENCOUNTER — Encounter: Payer: Self-pay | Admitting: Family

## 2013-03-03 DIAGNOSIS — J45909 Unspecified asthma, uncomplicated: Secondary | ICD-10-CM | POA: Insufficient documentation

## 2013-03-04 ENCOUNTER — Telehealth: Payer: Self-pay | Admitting: *Deleted

## 2013-03-04 NOTE — Telephone Encounter (Signed)
Pt brought form to office at last office visit from [facility] to complete for disability access services.  Form completed and faxed to [facility] @ [phone] and mailed per directions on form to: [place], [place] [address]  Also mailed to pt.

## 2013-03-05 ENCOUNTER — Telehealth: Payer: Self-pay | Admitting: *Deleted

## 2013-03-05 NOTE — Telephone Encounter (Signed)
Message copied by Ronny Flurry on Sat Mar 05, 2013 11:20 AM ------      Message from: O'SULLIVAN, MELISSA      Created: Fri Jan 28, 2013  1:18 PM       Normal diabetic eye exam 01/18/13 ------

## 2013-03-05 NOTE — Telephone Encounter (Signed)
Health Maintenance  updated

## 2013-03-21 ENCOUNTER — Telehealth: Payer: Self-pay | Admitting: Hematology & Oncology

## 2013-03-21 NOTE — Telephone Encounter (Signed)
Pt aware of 4-17 appointment transferred to RN line for her to leave message about inj

## 2013-03-22 ENCOUNTER — Ambulatory Visit (INDEPENDENT_AMBULATORY_CARE_PROVIDER_SITE_OTHER): Payer: PRIVATE HEALTH INSURANCE | Admitting: Family

## 2013-03-22 ENCOUNTER — Encounter: Payer: Self-pay | Admitting: Family

## 2013-03-22 ENCOUNTER — Other Ambulatory Visit: Payer: Self-pay | Admitting: Family

## 2013-03-22 VITALS — BP 151/106 | HR 61 | Temp 97.9°F | Resp 16 | Ht 67.0 in

## 2013-03-22 DIAGNOSIS — J209 Acute bronchitis, unspecified: Secondary | ICD-10-CM

## 2013-03-22 DIAGNOSIS — K754 Autoimmune hepatitis: Secondary | ICD-10-CM

## 2013-03-22 DIAGNOSIS — I10 Essential (primary) hypertension: Secondary | ICD-10-CM

## 2013-03-22 DIAGNOSIS — D649 Anemia, unspecified: Secondary | ICD-10-CM

## 2013-03-22 DIAGNOSIS — R0789 Other chest pain: Secondary | ICD-10-CM

## 2013-03-22 LAB — IRON AND TIBC
%SAT: 22 % (ref 20–55)
Iron: 72 ug/dL (ref 42–145)
TIBC: 321 ug/dL (ref 250–470)
UIBC: 249 ug/dL (ref 125–400)

## 2013-03-22 LAB — CBC WITH DIFFERENTIAL/PLATELET
Basophils Absolute: 0 10*3/uL (ref 0.0–0.1)
Basophils Relative: 0 % (ref 0–1)
Eosinophils Absolute: 0.2 10*3/uL (ref 0.0–0.7)
Eosinophils Relative: 4 % (ref 0–5)
HCT: 40.5 % (ref 36.0–46.0)
Hemoglobin: 14.1 g/dL (ref 12.0–15.0)
Lymphocytes Relative: 21 % (ref 12–46)
Lymphs Abs: 1 10*3/uL (ref 0.7–4.0)
MCH: 28 pg (ref 26.0–34.0)
MCHC: 34.8 g/dL (ref 30.0–36.0)
MCV: 80.4 fL (ref 78.0–100.0)
Monocytes Absolute: 0.3 10*3/uL (ref 0.1–1.0)
Monocytes Relative: 7 % (ref 3–12)
Neutro Abs: 3.2 10*3/uL (ref 1.7–7.7)
Neutrophils Relative %: 68 % (ref 43–77)
Platelets: 173 10*3/uL (ref 150–400)
RBC: 5.04 MIL/uL (ref 3.87–5.11)
RDW: 14.2 % (ref 11.5–15.5)
WBC: 4.7 10*3/uL (ref 4.0–10.5)

## 2013-03-22 LAB — HEPATIC FUNCTION PANEL
ALT: 23 U/L (ref 0–35)
AST: 28 U/L (ref 0–37)
Albumin: 3.8 g/dL (ref 3.5–5.2)
Alkaline Phosphatase: 93 U/L (ref 39–117)
Bilirubin, Direct: 0.1 mg/dL (ref 0.0–0.3)
Indirect Bilirubin: 0.4 mg/dL (ref 0.2–1.2)
Total Bilirubin: 0.5 mg/dL (ref 0.2–1.2)
Total Protein: 7.1 g/dL (ref 6.0–8.3)

## 2013-03-22 LAB — FERRITIN: Ferritin: 138 ng/mL (ref 10–291)

## 2013-03-22 MED ORDER — FLUTICASONE-SALMETEROL 100-50 MCG/DOSE IN AEPB: 1.0000 | INHALATION_SPRAY | Freq: Two times a day (BID) | RESPIRATORY_TRACT | Status: DC

## 2013-03-22 MED ORDER — LISINOPRIL 20 MG PO TABS: 20.0000 mg | ORAL_TABLET | Freq: Every day | ORAL | Status: DC

## 2013-03-22 MED ORDER — AZITHROMYCIN 250 MG PO TABS: ORAL_TABLET | ORAL | Status: DC
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
## 2013-03-22 NOTE — Progress Notes (Signed)
Pre visit review using our clinic review tool, if applicable. No additional management support is needed unless otherwise documented below in the visit note. 

## 2013-03-22 NOTE — Assessment & Plan Note (Signed)
Will rx with zithromax. Discussed addition of prednisone taper but she prefers to avoid prednisone.  Instead, will rx with 2 week course of advair 250/50, samples provided.  Continue albuterol q6 hrs.

## 2013-03-22 NOTE — Patient Instructions (Addendum)
Please complete your lab work prior to leaving. Start zithromax, continue albuterol every 6 hours. Start advair for the next 2 weeks. Increase lisinopril to 20 mg. Call if symptoms worsen or if symptoms are not improved in 2-3 days.  You will be contacted about your referral to cardiology. Follow up in 1 week.

## 2013-03-22 NOTE — Assessment & Plan Note (Signed)
Pt with hx of atypical chest pain, has followed with Dr. Rollene Fare.  No current cardiac complaints, but she is requesting referral to a new cardiologist.  Last note from Dr. Rollene Fare noted that she had declined follow up myoview at one time and they were focusing on possible GI etiology for her pain.  Will refer to Dr. Stanford Breed.

## 2013-03-22 NOTE — Assessment & Plan Note (Signed)
Deteriorated. Increase lisinopril from 10mg  to 20mg . Follow up in 1 week.

## 2013-03-23 ENCOUNTER — Encounter: Payer: Self-pay | Admitting: Family

## 2013-03-23 ENCOUNTER — Ambulatory Visit: Payer: PRIVATE HEALTH INSURANCE

## 2013-03-23 ENCOUNTER — Other Ambulatory Visit: Payer: Self-pay | Admitting: Family

## 2013-03-23 ENCOUNTER — Other Ambulatory Visit: Payer: PRIVATE HEALTH INSURANCE | Admitting: Lab

## 2013-03-23 ENCOUNTER — Ambulatory Visit: Payer: PRIVATE HEALTH INSURANCE | Admitting: Hematology & Oncology

## 2013-03-29 ENCOUNTER — Encounter: Payer: Self-pay | Admitting: Family

## 2013-03-29 ENCOUNTER — Ambulatory Visit (INDEPENDENT_AMBULATORY_CARE_PROVIDER_SITE_OTHER): Payer: PRIVATE HEALTH INSURANCE | Admitting: Family

## 2013-03-29 VITALS — BP 130/90 | HR 65 | Temp 98.6°F | Resp 16 | Ht 67.0 in | Wt 312.0 lb

## 2013-03-29 DIAGNOSIS — J209 Acute bronchitis, unspecified: Secondary | ICD-10-CM

## 2013-03-29 DIAGNOSIS — I10 Essential (primary) hypertension: Secondary | ICD-10-CM

## 2013-03-29 LAB — BASIC METABOLIC PANEL
BUN: 6 mg/dL (ref 6–23)
CO2: 33 mEq/L — ABNORMAL HIGH (ref 19–32)
Calcium: 8.8 mg/dL (ref 8.4–10.5)
Chloride: 101 mEq/L (ref 96–112)
Creat: 0.61 mg/dL (ref 0.50–1.10)
Glucose, Bld: 90 mg/dL (ref 70–99)
Potassium: 4.7 mEq/L (ref 3.5–5.3)
Sodium: 138 mEq/L (ref 135–145)

## 2013-03-29 NOTE — Assessment & Plan Note (Addendum)
improved on lisinopril 20 mg. Continue current dose. Obtain bmet.
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
## 2013-03-29 NOTE — Progress Notes (Signed)
Pre visit review using our clinic review tool, if applicable. No additional management support is needed unless otherwise documented below in the visit note. 

## 2013-03-29 NOTE — Patient Instructions (Signed)
Please complete lab work prior to leaving. Continue lisinopril 20 mg. Follow up in 3 months.

## 2013-03-29 NOTE — Assessment & Plan Note (Signed)
Resolving.  Advise her to continue advair for 1 more week.

## 2013-03-30 ENCOUNTER — Encounter: Payer: Self-pay | Admitting: Family

## 2013-04-05 ENCOUNTER — Telehealth: Payer: Self-pay | Admitting: *Deleted

## 2013-04-05 NOTE — Telephone Encounter (Signed)
Message copied by Lenn Sink on Tue Apr 05, 2013  2:58 PM ------      Message from: Burney Gauze R      Created: Wed Mar 23, 2013  6:36 PM       Cal;l - iron and liver test are ok. pete ------

## 2013-04-13 ENCOUNTER — Ambulatory Visit: Payer: PRIVATE HEALTH INSURANCE | Admitting: Cardiovascular Disease

## 2013-04-29 ENCOUNTER — Other Ambulatory Visit: Payer: PRIVATE HEALTH INSURANCE | Admitting: Lab

## 2013-04-29 ENCOUNTER — Ambulatory Visit: Payer: PRIVATE HEALTH INSURANCE | Admitting: Hematology & Oncology

## 2013-04-29 ENCOUNTER — Ambulatory Visit (HOSPITAL_BASED_OUTPATIENT_CLINIC_OR_DEPARTMENT_OTHER): Payer: PRIVATE HEALTH INSURANCE

## 2013-04-29 ENCOUNTER — Ambulatory Visit: Payer: PRIVATE HEALTH INSURANCE

## 2013-04-29 ENCOUNTER — Other Ambulatory Visit (HOSPITAL_BASED_OUTPATIENT_CLINIC_OR_DEPARTMENT_OTHER): Payer: PRIVATE HEALTH INSURANCE | Admitting: Lab

## 2013-04-29 DIAGNOSIS — D510 Vitamin B12 deficiency anemia due to intrinsic factor deficiency: Secondary | ICD-10-CM

## 2013-04-29 DIAGNOSIS — D508 Other iron deficiency anemias: Secondary | ICD-10-CM

## 2013-04-29 LAB — CBC WITH DIFFERENTIAL (CANCER CENTER ONLY)
BASO#: 0 10*3/uL (ref 0.0–0.2)
BASO%: 0.4 % (ref 0.0–2.0)
EOS%: 3.7 % (ref 0.0–7.0)
Eosinophils Absolute: 0.2 10*3/uL (ref 0.0–0.5)
HCT: 39.6 % (ref 34.8–46.6)
HGB: 13.6 g/dL (ref 11.6–15.9)
LYMPH#: 1.1 10*3/uL (ref 0.9–3.3)
LYMPH%: 23.3 % (ref 14.0–48.0)
MCH: 29.2 pg (ref 26.0–34.0)
MCHC: 34.3 g/dL (ref 32.0–36.0)
MCV: 85 fL (ref 81–101)
MONO#: 0.4 10*3/uL (ref 0.1–0.9)
MONO%: 8.2 % (ref 0.0–13.0)
NEUT#: 3.2 10*3/uL (ref 1.5–6.5)
NEUT%: 64.4 % (ref 39.6–80.0)
Platelets: 125 10*3/uL — ABNORMAL LOW (ref 145–400)
RBC: 4.65 10*6/uL (ref 3.70–5.32)
RDW: 14.2 % (ref 11.1–15.7)
WBC: 4.9 10*3/uL (ref 3.9–10.0)

## 2013-04-29 LAB — VITAMIN B12: Vitamin B-12: 345 pg/mL (ref 211–911)

## 2013-04-29 MED ORDER — CYANOCOBALAMIN 1000 MCG/ML IJ SOLN
1000.0000 ug | Freq: Once | INTRAMUSCULAR | Status: AC
  Administered 2013-04-29: 1000 ug via INTRAMUSCULAR

## 2013-04-29 MED ORDER — CYANOCOBALAMIN 1000 MCG/ML IJ SOLN
INTRAMUSCULAR | Status: AC
  Filled 2013-04-29: qty 1

## 2013-05-02 LAB — IRON AND TIBC CHCC
%SAT: 27 % (ref 21–57)
Iron: 81 ug/dL (ref 41–142)
TIBC: 298 ug/dL (ref 236–444)
UIBC: 216 ug/dL (ref 120–384)

## 2013-05-02 LAB — FERRITIN CHCC: Ferritin: 108 ng/ml (ref 9–269)

## 2013-05-03 ENCOUNTER — Telehealth: Payer: Self-pay | Admitting: Nurse Practitioner

## 2013-05-03 NOTE — Telephone Encounter (Addendum)
Message copied by Jimmy Footman on Tue May 03, 2013  9:13 AM ------      Message from: Burney Gauze R      Created: Mon May 02, 2013  6:41 PM       Please call and let her know that her iron level is okay. Thanks. Pete ------LVM on pt's personal machine and informed her of above message. Encouraged her to contact our office with any questions or concerns.

## 2013-05-06 ENCOUNTER — Encounter: Payer: Self-pay | Admitting: Cardiovascular Disease

## 2013-05-06 ENCOUNTER — Ambulatory Visit (INDEPENDENT_AMBULATORY_CARE_PROVIDER_SITE_OTHER): Payer: PRIVATE HEALTH INSURANCE | Admitting: Cardiovascular Disease

## 2013-05-06 VITALS — BP 130/88 | HR 62 | Ht 67.0 in | Wt 321.0 lb

## 2013-05-06 DIAGNOSIS — Z8679 Personal history of other diseases of the circulatory system: Secondary | ICD-10-CM

## 2013-05-06 DIAGNOSIS — I10 Essential (primary) hypertension: Secondary | ICD-10-CM

## 2013-05-06 DIAGNOSIS — R0789 Other chest pain: Secondary | ICD-10-CM

## 2013-05-06 DIAGNOSIS — E785 Hyperlipidemia, unspecified: Secondary | ICD-10-CM

## 2013-05-06 NOTE — Assessment & Plan Note (Signed)
Well controlled.  Continue current medications and low sodium Dash type diet.    

## 2013-05-06 NOTE — Assessment & Plan Note (Signed)
Non cardiac No need for further testing with normal cath in 2007 and multiple normal myovue studies done by [place].   Will f/u with primary biggest issue is chronic pain, hepatitis and obesity

## 2013-05-06 NOTE — Assessment & Plan Note (Signed)
Not clear but sounded like sinus tachycardia  ECG today totally normal Observe

## 2013-05-06 NOTE — Assessment & Plan Note (Signed)
Cholesterol is at goal.  Continue current dose of statin and diet Rx.  No myalgias or side effects.  F/U  LFT's in 6 months. No results found for this basename: [place]  F/U labs with Dr Randel Pigg with no known vasular disease target LDL 130

## 2013-05-06 NOTE — Patient Instructions (Signed)
Your physician recommends that you schedule a follow-up appointment in: AS NEEDED  Your physician recommends that you continue on your current medications as directed. Please refer to the Current Medication list given to you today.  

## 2013-05-06 NOTE — Progress Notes (Signed)
Patient ID: Megan [name], female   DOB: 03/29/1965, 48 y.o.   MRN: [id number]  48 yo patient of Dr Rollene Fare and [facility].  It is not clear to me that she has an active cardiac issue Referred by Dr Randel Pigg  She had autoimmune hepatitis in 2007 and was placed on high dose steroids  This caused tachycardia that was Rx with metoprolol  Reviewed cath notes form Dr Tami Ribas and normla cors with ? EF 40%  Over year BP RX and f/u echos have all shown normal EF most recently 4/14.  She has gained a lot of weight and has chronic back pain Sedentary with poor diet.  Compliant with meds  Occasional atypical pains from reflux and gastritis    ROS: Denies fever, malais, weight loss, blurry vision, decreased visual acuity, cough, sputum, SOB, hemoptysis, pleuritic pain, palpitaitons, heartburn, abdominal pain, melena, lower extremity edema, claudication, or rash.  All other systems reviewed and negative   General: Affect appropriate Obese white female  HEENT: prosthetic right eye  Neck supple with no adenopathy JVP normal no bruits no thyromegaly Lungs clear with no wheezing and good diaphragmatic motion Heart:  S1/S2 no murmur,rub, gallop or click PMI normal Abdomen: benighn, BS positve, no tenderness, no AAA no bruit.  No HSM or HJR Distal pulses intact with no bruits No edema Neuro non-focal Skin warm and dry No muscular weakness  Medications Current Outpatient Prescriptions  Medication Sig Dispense Refill  . aspirin 81 MG tablet Take 81 mg by mouth daily.       . cyanocobalamin (,VITAMIN B-12,) 1000 MCG/ML injection Inject 1,000 mcg into the muscle every 30 (thirty) days.      . Ipratropium-Albuterol (COMBIVENT) 20-100 MCG/ACT AERS respimat Inhale 1 puff into the lungs every 6 (six) hours as needed for wheezing.  4 g  3  . lisinopril (PRINIVIL,ZESTRIL) 20 MG tablet Take 1 tablet (20 mg total) by mouth daily.  30 tablet  2  . LYRICA 75 MG capsule       . PROAIR HFA 108 (90 BASE) MCG/ACT inhaler  INHALE 2 PUFFS INTO THE LUNGS EVERY 6 (SIX) HOURS AS NEEDED. FOR WHEEZING  8.5 each  1  . promethazine (PHENERGAN) 50 MG tablet Take 50 mg by mouth every 6 (six) hours as needed. For nausea      . SYNTHROID 200 MCG tablet TAKE 1 TABLET (200 MCG TOTAL) BY MOUTH DAILY.  30 tablet  3  . metoprolol tartrate (LOPRESSOR) 25 MG tablet Take 1 tablet (25 mg total) by mouth 2 (two) times daily.  60 tablet  9  . Oxycodone HCl 20 MG TABS Take 20 mg by mouth 4 (four) times daily.       No current facility-administered medications for this visit.    Allergies Hydrocodone; Penicillins; Gadolinium derivatives; Chantix; Cymbalta; Morphine; and Codeine  Family History: Family History  Problem Relation Age of Onset  . Heart disease Mother   . Thyroid disease Mother   . Hepatitis Mother     autoimmune  . Sleep apnea Mother   . Diabetes Father   . Stroke Father   . Hypertension Sister   . Depression Sister   . Arthritis Sister   . Neural tube defect Brother   . Depression Brother   . Depression Sister   . Other Sister     back surgery with fusion  . Colon polyps Sister   . Drug abuse Child   . Anesthesia problems Neg Hx   .  Hypotension Neg Hx   . Malignant hyperthermia Neg Hx   . Pseudochol deficiency Neg Hx     Social History: History   Social History  . Marital Status: Divorced    Spouse Name: N/A    Number of Children: N/A  . Years of Education: N/A   Occupational History  . disabled    Social History Main Topics  . Smoking status: Current Some Day Smoker -- 0.50 packs/day for 20 years    Last Attempt to Quit: 12/22/2012  . Smokeless tobacco: Never Used  . Alcohol Use: No  . Drug Use: No  . Sexual Activity: No   Other Topics Concern  . 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

## 2013-05-13 ENCOUNTER — Ambulatory Visit (INDEPENDENT_AMBULATORY_CARE_PROVIDER_SITE_OTHER): Payer: PRIVATE HEALTH INSURANCE | Admitting: Physician Assistant

## 2013-05-13 VITALS — BP 110/80 | HR 76 | Temp 98.2°F | Resp 18 | Ht 67.0 in | Wt 320.1 lb

## 2013-05-13 DIAGNOSIS — J329 Chronic sinusitis, unspecified: Secondary | ICD-10-CM

## 2013-05-13 DIAGNOSIS — J3489 Other specified disorders of nose and nasal sinuses: Secondary | ICD-10-CM

## 2013-05-13 MED ORDER — AZITHROMYCIN 250 MG PO TABS: ORAL_TABLET | ORAL | Status: DC

## 2013-05-13 MED ORDER — MUPIROCIN 2 % EX OINT: 1.0000 "application " | TOPICAL_OINTMENT | Freq: Two times a day (BID) | CUTANEOUS | Status: DC

## 2013-05-13 MED ORDER — LISINOPRIL 20 MG PO TABS: 20.0000 mg | ORAL_TABLET | Freq: Every day | ORAL | Status: DC

## 2013-05-13 MED ORDER — METOPROLOL TARTRATE 25 MG PO TABS: 25.0000 mg | ORAL_TABLET | Freq: Two times a day (BID) | ORAL | Status: DC

## 2013-05-13 NOTE — Progress Notes (Signed)
Pre visit review using our clinic review tool, if applicable. No additional management support is needed unless otherwise documented below in the visit note. 

## 2013-05-13 NOTE — Patient Instructions (Signed)
For sinus symptoms -- Take antibiotic (Azithromycin) as prescribed.  Increase fluid intake.  Rest.  Saline nasal spray. Mucinex. Humidifier in bedroom. Continue Proair.  Use Advair diskus -- 1 puff every 12 hours.  Please call or return to clinic if symptoms are not improving.  For nose sore -- Use Mupirocin (Bactroban) ointment as directed.  This should help with healing.

## 2013-05-15 ENCOUNTER — Encounter: Payer: Self-pay | Admitting: Physician Assistant

## 2013-05-15 DIAGNOSIS — J329 Chronic sinusitis, unspecified: Secondary | ICD-10-CM | POA: Insufficient documentation

## 2013-05-15 DIAGNOSIS — J3489 Other specified disorders of nose and nasal sinuses: Secondary | ICD-10-CM | POA: Insufficient documentation

## 2013-05-15 DIAGNOSIS — J019 Acute sinusitis, unspecified: Secondary | ICD-10-CM | POA: Insufficient documentation

## 2013-05-15 HISTORY — DX: Acute sinusitis, unspecified: J01.90
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
## 2013-05-15 NOTE — Assessment & Plan Note (Signed)
Rx azithromycin. Increase fluid intake. Rest. Saline nasal spray. Continue current care. Patient out of her Combivent. Sample of Qvar given to take as directed during illness. Humidifier in bedroom. Plain Mucinex.

## 2013-05-15 NOTE — Assessment & Plan Note (Signed)
Rx mupirocin ointment. Advise Vaseline and humidifier to keep nasal passages moist and to promote healing.

## 2013-05-25 ENCOUNTER — Ambulatory Visit: Payer: PRIVATE HEALTH INSURANCE | Admitting: Hematology & Oncology

## 2013-05-25 ENCOUNTER — Telehealth: Payer: Self-pay | Admitting: Hematology & Oncology

## 2013-05-25 ENCOUNTER — Other Ambulatory Visit: Payer: PRIVATE HEALTH INSURANCE | Admitting: Lab

## 2013-05-25 NOTE — Telephone Encounter (Signed)
Pt called to cancel, due to exams. And wanted to know if she could get fit in at a later time today. Will call Liliane Channel back to confirm, is so.

## 2013-05-25 NOTE — Telephone Encounter (Signed)
Pt made 6-17 appointment had to have afternoons

## 2013-05-30 ENCOUNTER — Other Ambulatory Visit: Payer: Self-pay | Admitting: Gastroenterology

## 2013-06-10 ENCOUNTER — Ambulatory Visit (HOSPITAL_COMMUNITY): Payer: PRIVATE HEALTH INSURANCE | Admitting: Anesthesiology

## 2013-06-10 ENCOUNTER — Encounter (HOSPITAL_COMMUNITY): Payer: Self-pay | Admitting: *Deleted

## 2013-06-10 ENCOUNTER — Encounter (HOSPITAL_COMMUNITY)
Admission: RE | Disposition: A | Discharge status: Home / Self Care | Payer: PRIVATE HEALTH INSURANCE | Source: Ambulatory Visit | Attending: Gastroenterology

## 2013-06-10 ENCOUNTER — Encounter (HOSPITAL_COMMUNITY): Payer: PRIVATE HEALTH INSURANCE | Admitting: Anesthesiology

## 2013-06-10 ENCOUNTER — Ambulatory Visit (HOSPITAL_COMMUNITY)
Admission: RE | Admit: 2013-06-10 | Discharge: 2013-06-10 | Disposition: A | Discharge status: Home / Self Care | Payer: PRIVATE HEALTH INSURANCE | Source: Ambulatory Visit | Attending: Gastroenterology | Admitting: Gastroenterology

## 2013-06-10 DIAGNOSIS — F17200 Nicotine dependence, unspecified, uncomplicated: Secondary | ICD-10-CM | POA: Insufficient documentation

## 2013-06-10 DIAGNOSIS — K7689 Other specified diseases of liver: Secondary | ICD-10-CM | POA: Diagnosis not present

## 2013-06-10 DIAGNOSIS — J45909 Unspecified asthma, uncomplicated: Secondary | ICD-10-CM | POA: Diagnosis not present

## 2013-06-10 DIAGNOSIS — E669 Obesity, unspecified: Secondary | ICD-10-CM | POA: Diagnosis not present

## 2013-06-10 DIAGNOSIS — Z880 Allergy status to penicillin: Secondary | ICD-10-CM | POA: Insufficient documentation

## 2013-06-10 DIAGNOSIS — Z1211 Encounter for screening for malignant neoplasm of colon: Secondary | ICD-10-CM | POA: Diagnosis present

## 2013-06-10 DIAGNOSIS — I10 Essential (primary) hypertension: Secondary | ICD-10-CM | POA: Diagnosis not present

## 2013-06-10 DIAGNOSIS — E119 Type 2 diabetes mellitus without complications: Secondary | ICD-10-CM | POA: Insufficient documentation

## 2013-06-10 DIAGNOSIS — K5900 Constipation, unspecified: Secondary | ICD-10-CM | POA: Diagnosis not present

## 2013-06-10 DIAGNOSIS — K3184 Gastroparesis: Secondary | ICD-10-CM | POA: Diagnosis not present

## 2013-06-10 DIAGNOSIS — Z888 Allergy status to other drugs, medicaments and biological substances status: Secondary | ICD-10-CM | POA: Insufficient documentation

## 2013-06-10 DIAGNOSIS — E039 Hypothyroidism, unspecified: Secondary | ICD-10-CM | POA: Diagnosis not present

## 2013-06-10 DIAGNOSIS — Z885 Allergy status to narcotic agent status: Secondary | ICD-10-CM | POA: Insufficient documentation

## 2013-06-10 DIAGNOSIS — E785 Hyperlipidemia, unspecified: Secondary | ICD-10-CM | POA: Diagnosis not present

## 2013-06-10 DIAGNOSIS — IMO0001 Reserved for inherently not codable concepts without codable children: Secondary | ICD-10-CM | POA: Insufficient documentation

## 2013-06-10 HISTORY — PX: COLONOSCOPY: SHX5424

## 2013-06-10 HISTORY — PX: ESOPHAGOGASTRODUODENOSCOPY: SHX5428

## 2013-06-10 SURGERY — COLONOSCOPY
Anesthesia: Monitor Anesthesia Care

## 2013-06-10 MED ORDER — PROPOFOL 10 MG/ML IV BOLUS
INTRAVENOUS | Status: AC
  Filled 2013-06-10: qty 20

## 2013-06-10 MED ORDER — BUTAMBEN-TETRACAINE-BENZOCAINE 2-2-14 % EX AERO
INHALATION_SPRAY | CUTANEOUS | Status: DC | PRN
  Administered 2013-06-10: 2 via TOPICAL

## 2013-06-10 MED ORDER — PROPOFOL INFUSION 10 MG/ML OPTIME
INTRAVENOUS | Status: DC | PRN
  Administered 2013-06-10: 200 ug/kg/min via INTRAVENOUS

## 2013-06-10 MED ORDER — PROPOFOL 10 MG/ML IV BOLUS
INTRAVENOUS | Status: AC
Start: 2013-06-10 — End: 2013-06-10
  Filled 2013-06-10: qty 20

## 2013-06-10 MED ORDER — LACTATED RINGERS IV SOLN
INTRAVENOUS | Status: DC | PRN
  Administered 2013-06-10: 15:00:00 via INTRAVENOUS

## 2013-06-10 MED ORDER — SODIUM CHLORIDE 0.9 % IV SOLN
INTRAVENOUS | Status: DC
  Administered 2013-06-10: 500 mL via INTRAVENOUS
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
## 2013-06-10 NOTE — Anesthesia Postprocedure Evaluation (Signed)
Anesthesia Post Note  Patient: Megan [name]  Procedure(s) Performed: Procedure(s) (LRB): COLONOSCOPY (N/A) ESOPHAGOGASTRODUODENOSCOPY (EGD) (N/A)  Anesthesia type: MAC  Patient location: PACU  Post pain: Pain level controlled  Post assessment: Post-op Vital signs reviewed  Last Vitals:  Filed Vitals:   06/10/13 1600  BP: 119/49  Pulse: 50  Temp:   Resp: 16    Post vital signs: Reviewed  Level of consciousness: sedated  Complications: No apparent anesthesia complications

## 2013-06-10 NOTE — Discharge Instructions (Addendum)
Colonoscopy, Care After  Refer to this sheet in the next few weeks. These instructions provide you with information on caring for yourself after your procedure. Your health care provider may also give you more specific instructions. Your treatment has been planned according to current medical practices, but problems sometimes occur. Call your health care provider if you have any problems or questions after your procedure.  WHAT TO EXPECT AFTER THE PROCEDURE   After your procedure, it is typical to have the following:  · A small amount of blood in your stool.  · Moderate amounts of gas and mild abdominal cramping or bloating.  HOME CARE INSTRUCTIONS  · Do not drive, operate machinery, or sign important documents for 24 hours.  · You may shower and resume your regular physical activities, but move at a slower pace for the first 24 hours.  · Take frequent rest periods for the first 24 hours.  · Walk around or put a warm pack on your abdomen to help reduce abdominal cramping and bloating.  · Drink enough fluids to keep your urine clear or pale yellow.  · You may resume your normal diet as instructed by your health care provider. Avoid heavy or fried foods that are hard to digest.  · Avoid drinking alcohol for 24 hours or as instructed by your health care provider.  · Only take over-the-counter or prescription medicines as directed by your health care provider.  · If a tissue sample (biopsy) was taken during your procedure:  · Do not take aspirin or blood thinners for 7 days, or as instructed by your health care provider.  · Do not drink alcohol for 7 days, or as instructed by your health care provider.  · Eat soft foods for the first 24 hours.  SEEK MEDICAL CARE IF:  You have persistent spotting of blood in your stool 2 3 days after the procedure.  SEEK IMMEDIATE MEDICAL CARE IF:  · You have more than a small spotting of blood in your stool.  · You pass large blood clots in your stool.  · Your abdomen is swollen  (distended).  · You have nausea or vomiting.  · You have a fever.  · You have increasing abdominal pain that is not relieved with medicine.  Document Released: 08/14/2003 Document Revised: 10/20/2012 Document Reviewed: 09/06/2012  ExitCare® Patient Information ©2014 [employer].    Gastrointestinal Endoscopy  Care After  Refer to this sheet in the next few weeks. These instructions provide you with information on caring for yourself after your procedure. Your caregiver may also give you more specific instructions. Your treatment has been planned according to current medical practices, but problems sometimes occur. Call your caregiver if you have any problems or questions after your procedure.  HOME CARE INSTRUCTIONS  · If you were given medicine to help you relax (sedative), do not drive, operate machinery, or sign important documents for 24 hours.  · Avoid alcohol and hot or warm beverages for the first 24 hours after the procedure.  · Only take over-the-counter or prescription medicines for pain, discomfort, or fever as directed by your caregiver. You may resume taking your normal medicines unless your caregiver tells you otherwise. Ask your caregiver when you may resume taking medicines that may cause bleeding, such as aspirin, clopidogrel, or warfarin.  · You may return to your normal diet and activities on the day after your procedure, or as directed by your caregiver. Walking may help to reduce any bloated feeling   in your abdomen.  · Drink enough fluids to keep your urine clear or pale yellow.  · You may gargle with salt water if you have a sore throat.  SEEK IMMEDIATE MEDICAL CARE IF:  · You have severe nausea or vomiting.  · You have severe abdominal pain, abdominal cramps that last longer than 6 hours, or abdominal swelling (distention).  · You have severe shoulder or back pain.  · You have trouble swallowing.  · You have shortness of breath, your breathing is shallow, or you are breathing faster than  normal.  · You have a fever or a rapid heartbeat.  · You vomit blood or material that looks like coffee grounds.  · You have bloody, black, or tarry stools.  MAKE SURE YOU:  · Understand these instructions.  · Will watch your condition.  · Will get help right away if you are not doing well or get worse.  Document Released: 08/14/2003 Document Revised: 07/01/2011 Document Reviewed: 04/01/2011  ExitCare® Patient Information ©2014 [employer].

## 2013-06-10 NOTE — OR Nursing (Signed)
2 unsuccessful attempts to start 22g to the right hand and them to the left hand.  Unable to advance. Right hand WNL, left hand with small bruise.  Patient tolerated well.  Requested CRNA to attempt.

## 2013-06-10 NOTE — Op Note (Signed)
[place], [id number]   OPERATIVE PROCEDURE REPORT  PATIENT: Megan, [name].  MR#: [id number] BIRTHDATE: 1965/04/09  GENDER: Female ENDOSCOPIST: Carol Ada, MD ASSISTANT:   Tedra Coupe, Alcide Clever, RN, CGRN PROCEDURE DATE: 06/10/2013 PROCEDURE:   Colonoscopy, diagnostic ASA CLASS:   Class III INDICATIONS:Colorectal cancer screening. MEDICATIONS: MAC sedation, administered by CRNA  DESCRIPTION OF PROCEDURE:   After the risks benefits and alternatives of the procedure were thoroughly explained, informed consent was obtained.  A digital rectal exam revealed no abnormalities of the rectum.    The Pentax Ped Colon [id number] endoscope was introduced through the anus  and advanced to the cecum, which was identified by both the appendix and ileocecal valve , No adverse events experienced.    The quality of the prep was good. .  The instrument was then slowly withdrawn as the colon was fully examined.     FINDINGS: A normal appearing cecum, ileocecal valve, and appendiceal orifice were identified.  The ascending, hepatic flexure, transverse, splenic flexure, descending, sigmoid colon and rectum appeared unremarkable.  No polyps or cancers were seen. Retroflexed views revealed no abnormalities.     The scope was then withdrawn from the patient and the procedure terminated.  COMPLICATIONS: There were no complications.  IMPRESSION: 1) Normal colon.  RECOMMENDATIONS: 1) Repeat the colonoscopy in 10 years.  _______________________________ eSignedCarol Ada, MD 06/10/2013 3:45 PM

## 2013-06-10 NOTE — Transfer of Care (Signed)
Immediate Anesthesia Transfer of Care Note  Patient: Megan [name] [facility]  Procedure(s) Performed: Procedure(s): COLONOSCOPY (N/A) ESOPHAGOGASTRODUODENOSCOPY (EGD) (N/A)  Patient Location: PACU and Endoscopy Unit  Anesthesia Type:MAC  Level of Consciousness: awake and alert   Airway & Oxygen Therapy: Patient Spontanous Breathing and Patient connected to nasal cannula oxygen  Post-op Assessment: Report given to PACU RN and Post -op Vital signs reviewed and stable  Post vital signs: Reviewed and stable  Complications: No apparent anesthesia complications

## 2013-06-10 NOTE — Op Note (Signed)
[place], [address]   OPERATIVE PROCEDURE REPORT  PATIENT: Megan [name], Megan [name].  MR#: [id number] BIRTHDATE: [birth year]  GENDER: Female ENDOSCOPIST: Carol Ada, MD ASSISTANT:   Tedra Coupe, and Alcide Clever, RN, CGRN PROCEDURE DATE: 06/10/2013 PROCEDURE:   EGD, diagnostic ASA CLASS:   Class III INDICATIONS:ABM pain MEDICATIONS: MAC sedation, administered by CRNA TOPICAL ANESTHETIC:   none  DESCRIPTION OF PROCEDURE:   After the risks benefits and alternatives of the procedure were thoroughly explained, informed consent was obtained.  The     endoscope was introduced through the mouth  and advanced to the second portion of the duodenum Without limitations.      The instrument was slowly withdrawn as the mucosa was fully examined.  FINDINGS:  The upper, middle and distal third of the esophagus were carefully inspected and no abnormalities were noted.  The z-line was well seen at the GEJ.  The endoscope was pushed into the fundus which was normal including a retroflexed view.  The antrum, gastric body, first and second part of the duodenum were unremarkable. Retroflexed views revealed no abnormalities.     The scope was then withdrawn from the patient and the procedure terminated. COMPLICATIONS: There were no complications.  IMPRESSION: 1) Normal EGD.  RECOMMENDATIONS: 1) Continue with PPI. 2) Trial of sucralfate. 3) Follow up in one month.  _______________________________ eSigned:  Carol Ada, MD 06/10/2013 3:43 PM

## 2013-06-10 NOTE — Anesthesia Preprocedure Evaluation (Signed)
Anesthesia Evaluation  Patient identified by MRN, date of birth, ID band Patient awake    Reviewed: Allergy & Precautions, H&P , NPO status , Patient's Chart, lab work & pertinent test results  Airway Mallampati: II TM Distance: >3 FB     Dental  (+) Loose, Teeth Intact,    Pulmonary neg pulmonary ROS, Current Smoker,  breath sounds clear to auscultation        Cardiovascular hypertension, Pt. on medications Rhythm:Regular Rate:Normal     Neuro/Psych negative neurological ROS  negative psych ROS   GI/Hepatic negative GI ROS, (+) Hepatitis - (Autoimune Hepatitis)  Endo/Other  diabetes, Type 2Hypothyroidism   Renal/GU negative Renal ROS  negative genitourinary   Musculoskeletal negative musculoskeletal ROS (+)   Abdominal (+) + obese,  Abdomen: soft.    Peds negative pediatric ROS (+)  Hematology negative hematology ROS (+)   Anesthesia Other Findings   Reproductive/Obstetrics                           Anesthesia Physical Anesthesia Plan  ASA: III  Anesthesia Plan: MAC   Post-op Pain Management:    Induction: Intravenous  Airway Management Planned: Simple Face Mask and Nasal Cannula  Additional Equipment:   Intra-op Plan:   Post-operative Plan:   Informed Consent: I have reviewed the patients History and Physical, chart, labs and discussed the procedure including the risks, benefits and alternatives for the proposed anesthesia with the patient or authorized representative who has indicated his/her understanding and acceptance.   Dental advisory given  Plan Discussed with: CRNA  Anesthesia Plan Comments:         Anesthesia Quick Evaluation

## 2013-06-11 ENCOUNTER — Other Ambulatory Visit: Payer: Self-pay | Admitting: Family

## 2013-06-13 ENCOUNTER — Encounter (HOSPITAL_COMMUNITY): Payer: Self-pay | Admitting: Gastroenterology

## 2013-06-28 ENCOUNTER — Other Ambulatory Visit: Payer: Self-pay | Admitting: *Deleted

## 2013-06-28 DIAGNOSIS — D510 Vitamin B12 deficiency anemia due to intrinsic factor deficiency: Secondary | ICD-10-CM

## 2013-06-29 ENCOUNTER — Ambulatory Visit (HOSPITAL_BASED_OUTPATIENT_CLINIC_OR_DEPARTMENT_OTHER): Payer: PRIVATE HEALTH INSURANCE

## 2013-06-29 ENCOUNTER — Other Ambulatory Visit (HOSPITAL_BASED_OUTPATIENT_CLINIC_OR_DEPARTMENT_OTHER): Payer: PRIVATE HEALTH INSURANCE | Admitting: Lab

## 2013-06-29 ENCOUNTER — Encounter: Payer: Self-pay | Admitting: Hematology & Oncology

## 2013-06-29 ENCOUNTER — Ambulatory Visit (HOSPITAL_BASED_OUTPATIENT_CLINIC_OR_DEPARTMENT_OTHER): Payer: PRIVATE HEALTH INSURANCE | Admitting: Hematology & Oncology

## 2013-06-29 VITALS — BP 128/79 | HR 63 | Temp 98.1°F | Resp 16 | Ht 67.0 in | Wt 323.0 lb

## 2013-06-29 DIAGNOSIS — D510 Vitamin B12 deficiency anemia due to intrinsic factor deficiency: Secondary | ICD-10-CM

## 2013-06-29 DIAGNOSIS — D508 Other iron deficiency anemias: Secondary | ICD-10-CM

## 2013-06-29 DIAGNOSIS — D509 Iron deficiency anemia, unspecified: Secondary | ICD-10-CM

## 2013-06-29 LAB — CBC WITH DIFFERENTIAL (CANCER CENTER ONLY)
BASO#: 0 10*3/uL (ref 0.0–0.2)
BASO%: 0.4 % (ref 0.0–2.0)
EOS%: 4.4 % (ref 0.0–7.0)
Eosinophils Absolute: 0.2 10*3/uL (ref 0.0–0.5)
HCT: 40.1 % (ref 34.8–46.6)
HGB: 13.8 g/dL (ref 11.6–15.9)
LYMPH#: 1.1 10*3/uL (ref 0.9–3.3)
LYMPH%: 23.1 % (ref 14.0–48.0)
MCH: 29.4 pg (ref 26.0–34.0)
MCHC: 34.4 g/dL (ref 32.0–36.0)
MCV: 85 fL (ref 81–101)
MONO#: 0.4 10*3/uL (ref 0.1–0.9)
MONO%: 8.3 % (ref 0.0–13.0)
NEUT#: 2.9 10*3/uL (ref 1.5–6.5)
NEUT%: 63.8 % (ref 39.6–80.0)
Platelets: 128 10*3/uL — ABNORMAL LOW (ref 145–400)
RBC: 4.7 10*6/uL (ref 3.70–5.32)
RDW: 13.3 % (ref 11.1–15.7)
WBC: 4.6 10*3/uL (ref 3.9–10.0)

## 2013-06-29 MED ORDER — CYANOCOBALAMIN 1000 MCG/ML IJ SOLN
1000.0000 ug | Freq: Once | INTRAMUSCULAR | Status: AC
  Administered 2013-06-29: 1000 ug via INTRAMUSCULAR

## 2013-06-29 MED ORDER — CYANOCOBALAMIN 1000 MCG/ML IJ SOLN
INTRAMUSCULAR | Status: AC
  Filled 2013-06-29: qty 1

## 2013-06-29 NOTE — Patient Instructions (Signed)
Cyanocobalamin, Vitamin B12 injection °What is this medicine? °CYANOCOBALAMIN (sye an oh koe BAL a min) is a man made form of vitamin B12. Vitamin B12 is used in the growth of healthy blood cells, nerve cells, and proteins in the body. It also helps with the metabolism of fats and carbohydrates. This medicine is used to treat people who can not absorb vitamin B12. °This medicine may be used for other purposes; ask your health care provider or pharmacist if you have questions. °COMMON BRAND NAME(S): Cyomin, LA-12 , Nutri-Twelve , Primabalt °What should I tell my health care provider before I take this medicine? °They need to know if you have any of these conditions: °-kidney disease °-Leber's disease °-megaloblastic anemia °-an unusual or allergic reaction to cyanocobalamin, cobalt, other medicines, foods, dyes, or preservatives °-pregnant or trying to get pregnant °-breast-feeding °How should I use this medicine? °This medicine is injected into a muscle or deeply under the skin. It is usually given by a health care professional in a clinic or doctor's office. However, your doctor may teach you how to inject yourself. Follow all instructions. °Talk to your pediatrician regarding the use of this medicine in children. Special care may be needed. °Overdosage: If you think you have taken too much of this medicine contact a poison control center or emergency room at once. °NOTE: This medicine is only for you. Do not share this medicine with others. °What if I miss a dose? °If you are given your dose at a clinic or doctor's office, call to reschedule your appointment. If you give your own injections and you miss a dose, take it as soon as you can. If it is almost time for your next dose, take only that dose. Do not take double or extra doses. °What may interact with this medicine? °-colchicine °-heavy alcohol intake °This list may not describe all possible interactions. Give your health care provider a list of all the  medicines, herbs, non-prescription drugs, or dietary supplements you use. Also tell them if you smoke, drink alcohol, or use illegal drugs. Some items may interact with your medicine. °What should I watch for while using this medicine? °Visit your doctor or health care professional regularly. You may need blood work done while you are taking this medicine. °You may need to follow a special diet. Talk to your doctor. Limit your alcohol intake and avoid smoking to get the best benefit. °What side effects may I notice from receiving this medicine? °Side effects that you should report to your doctor or health care professional as soon as possible: °-allergic reactions like skin rash, itching or hives, swelling of the face, lips, or tongue °-blue tint to skin °-chest tightness, pain °-difficulty breathing, wheezing °-dizziness °-red, swollen painful area on the leg °Side effects that usually do not require medical attention (report to your doctor or health care professional if they continue or are bothersome): °-diarrhea °-headache °This list may not describe all possible side effects. Call your doctor for medical advice about side effects. You may report side effects to FDA at 1-800-FDA-1088. °Where should I keep my medicine? °Keep out of the reach of children. °Store at room temperature between 15 and 30 degrees C (59 and 85 degrees F). Protect from light. Throw away any unused medicine after the expiration date. °NOTE: This sheet is a summary. It may not cover all possible information. If you have questions about this medicine, talk to your doctor, pharmacist, or health care provider. °© 2014, Elsevier/Gold Standard. (2007-04-12   22:10:20) ° °

## 2013-06-30 LAB — FERRITIN CHCC: Ferritin: 144 ng/ml (ref 9–269)

## 2013-06-30 LAB — IRON AND TIBC CHCC
%SAT: 27 % (ref 21–57)
Iron: 80 ug/dL (ref 41–142)
TIBC: 298 ug/dL (ref 236–444)
UIBC: 217 ug/dL (ref 120–384)

## 2013-06-30 NOTE — Progress Notes (Signed)
Hematology and Oncology Follow Up Visit  Ryana Montecalvo [facility] [id number] December 23, 1965 48 y.o. 06/30/2013   Principle Diagnosis:  1. Recurrent iron-deficiency anemia. 2. Menometrorrhagia. 3. Morbid obesity. 4. pernicious anemia  Current Therapy:   IV iron as indicated Vitamin B12 1 mg IM every month    Interim History:  Ms.  Apodaca is back for followup. We last saw her back in December. She is doing okay. She's having more back issues. She has had back surgery. Patient is losing weight. She try to lose weight without having to have surgery. It has been a slow process.  When we last saw her, her iron studies showed ferritin of 108 and he iron saturation of 27%. Her B12 was 3 and 45.  She just feels tired. She does not have a lot of energy. She's had no bleeding. She's had no nausea vomiting. There's been no rashes. She's had no change in medications.      Medications: Current outpatient prescriptions:aspirin 81 MG tablet, Take 81 mg by mouth daily. , Disp: , Rfl: ;  cyanocobalamin (,VITAMIN B-12,) 1000 MCG/ML injection, Inject 1,000 mcg into the muscle every 30 (thirty) days., Disp: , Rfl: ;  Ipratropium-Albuterol (COMBIVENT) 20-100 MCG/ACT AERS respimat, Inhale 1 puff into the lungs every 6 (six) hours as needed for wheezing., Disp: 4 g, Rfl: 3 lisinopril (PRINIVIL,ZESTRIL) 20 MG tablet, Take 1 tablet (20 mg total) by mouth daily., Disp: 30 tablet, Rfl: 2;  LYRICA 75 MG capsule, Take 75 mg by mouth 2 (two) times daily. , Disp: , Rfl: ;  metoprolol tartrate (LOPRESSOR) 25 MG tablet, Take 1 tablet (25 mg total) by mouth 2 (two) times daily., Disp: 60 tablet, Rfl: 2;  mupirocin ointment (BACTROBAN) 2 %, Place 1 application into the nose 2 (two) times daily., Disp: 22 g, Rfl: 0 ondansetron (ZOFRAN ODT) 8 MG disintegrating tablet, Take 8 mg by mouth as needed for nausea or vomiting., Disp: , Rfl: ;  Oxycodone HCl 20 MG TABS, Take 20 mg by mouth 4 (four) times daily., Disp: , Rfl: ;  PROAIR HFA 108 (90 BASE)  MCG/ACT inhaler, INHALE 2 PUFFS INTO THE LUNGS EVERY 6 (SIX) HOURS AS NEEDED. FOR WHEEZING, Disp: 8.5 each, Rfl: 1 promethazine (PHENERGAN) 50 MG tablet, Take 50 mg by mouth every 6 (six) hours as needed. For nausea, Disp: , Rfl: ;  SYNTHROID 200 MCG tablet, TAKE 1 TABLET (200 MCG TOTAL) BY MOUTH DAILY., Disp: 30 tablet, Rfl: 3  Allergies:  Allergies  Allergen Reactions  . Hydrocodone Anaphylaxis  . Penicillins Rash    Throat swells up as well  . Gadolinium Derivatives     Reactions have happened twice, after leaving facility. Pt reports feeling hot in trunk but extremities were icy cold; shivering, vomiting the first time after receiving contrast.  Symptoms lasted 12-18 hours.    . Chantix [Varenicline] Nausea Only  . Cymbalta [Duloxetine Hcl]     headach  . Morphine Other (See Comments)    REACTION: irregular heart beat  . Codeine Rash    Past Medical History, Surgical history, Social history, and Family History were reviewed and updated.  Review of Systems: As above  Physical Exam:  height is 5\' 7"  (1.702 m) and weight is 323 lb (146.512 kg). Her oral temperature is 98.1 F (36.7 C). Her blood pressure is 128/79 and her pulse is 63. Her respiration is 16.   Obese white female in no obvious distress. Head and neck exam shows no ocular or oral lesions.  There are no palpable cervical or supraclavicular lymph nodes. Lungs are clear. Cardiac exam regular in rhythm with no murmurs rubs or bruits. Abdomen is soft. Has good bowel sounds. There is no fluid wave. There is no palpable liver or spleen tip. Exam shows laminectomy scar in the lumbar spine. Extremities shows no clubbing cyanosis or edema. Skin exam no rashes. Neurological exam is nonfocal.  Lab Results  Component Value Date   WBC 4.6 06/29/2013   HGB 13.8 06/29/2013   HCT 40.1 06/29/2013   MCV 85 06/29/2013   PLT 128* 06/29/2013     Chemistry      Component Value Date/Time   NA 138 03/29/2013 1413   K 4.7 03/29/2013 1413   CL  101 03/29/2013 1413   CO2 33* 03/29/2013 1413   BUN 6 03/29/2013 1413   CREATININE 0.61 03/29/2013 1413   CREATININE 0.69 07/19/2012 1523      Component Value Date/Time   CALCIUM 8.8 03/29/2013 1413   ALKPHOS 93 03/22/2013 1627   AST 28 03/22/2013 1627   ALT 23 03/22/2013 1627   BILITOT 0.5 03/22/2013 1627         Impression and Plan: Ms. Koroma is 48 year old white female. She is history of iron deficiency anemia. She has some for his anemia.  Her iron studies today are pretty good. Her ferritin is 144 with iron saturation of 27%. I don't to have to give her any IV iron.  We will go ahead and give her some vitamin B12. This will make her feel a little better.  We will measures his back monthly for B12 injections.  I will plan to get her back to see me in about 3 months.   Volanda Napoleon, MD 6/18/20156:14 PM

## 2013-07-01 ENCOUNTER — Telehealth: Payer: Self-pay | Admitting: Hematology & Oncology

## 2013-07-01 ENCOUNTER — Telehealth: Payer: Self-pay | Admitting: *Deleted

## 2013-07-01 NOTE — Telephone Encounter (Addendum)
Message copied by Lenn Sink on Fri Jul 01, 2013 10:19 AM ------      Message from: Burney Gauze R      Created: Thu Jun 30, 2013  7:17 PM       Call - iron is ok!!  pete ------Informed pt that iron is okay.

## 2013-07-01 NOTE — Telephone Encounter (Signed)
Left pt message to call for appointments. I scheduled them but she is not aware I also mailed schedule

## 2013-07-04 ENCOUNTER — Other Ambulatory Visit: Payer: Self-pay | Admitting: Family

## 2013-07-04 ENCOUNTER — Ambulatory Visit (INDEPENDENT_AMBULATORY_CARE_PROVIDER_SITE_OTHER): Payer: PRIVATE HEALTH INSURANCE | Admitting: Family

## 2013-07-04 ENCOUNTER — Encounter: Payer: Self-pay | Admitting: Family

## 2013-07-04 VITALS — BP 120/90 | HR 66 | Temp 98.9°F | Ht 67.0 in

## 2013-07-04 DIAGNOSIS — R1030 Lower abdominal pain, unspecified: Secondary | ICD-10-CM

## 2013-07-04 DIAGNOSIS — I10 Essential (primary) hypertension: Secondary | ICD-10-CM

## 2013-07-04 DIAGNOSIS — R142 Eructation: Secondary | ICD-10-CM

## 2013-07-04 DIAGNOSIS — E039 Hypothyroidism, unspecified: Secondary | ICD-10-CM

## 2013-07-04 DIAGNOSIS — R143 Flatulence: Secondary | ICD-10-CM

## 2013-07-04 DIAGNOSIS — R7309 Other abnormal glucose: Secondary | ICD-10-CM

## 2013-07-04 DIAGNOSIS — R10819 Abdominal tenderness, unspecified site: Secondary | ICD-10-CM | POA: Insufficient documentation

## 2013-07-04 DIAGNOSIS — R108A3 Suprapubic tenderness: Secondary | ICD-10-CM

## 2013-07-04 DIAGNOSIS — R739 Hyperglycemia, unspecified: Secondary | ICD-10-CM

## 2013-07-04 DIAGNOSIS — R140 Abdominal distension (gaseous): Secondary | ICD-10-CM

## 2013-07-04 DIAGNOSIS — R109 Unspecified abdominal pain: Secondary | ICD-10-CM

## 2013-07-04 DIAGNOSIS — J45909 Unspecified asthma, uncomplicated: Secondary | ICD-10-CM

## 2013-07-04 DIAGNOSIS — R141 Gas pain: Secondary | ICD-10-CM

## 2013-07-04 LAB — HEMOGLOBIN A1C
Hgb A1c MFr Bld: 5.5 % (ref <5.7)
Mean Plasma Glucose: 111 mg/dL (ref <117)

## 2013-07-04 LAB — TSH: TSH: 0.18 u[IU]/mL — ABNORMAL LOW (ref 0.350–4.500)

## 2013-07-04 NOTE — Patient Instructions (Signed)
Please follow through with your GYN appointment. Complete lab work prior to leaving. Call if symptoms worsen or if symptoms do not improve. Please schedule a follow up appointment in 3 months.

## 2013-07-04 NOTE — Assessment & Plan Note (Signed)
I suspect that this is related to gastroparesis.

## 2013-07-04 NOTE — Assessment & Plan Note (Signed)
?   GU etiology. Advised pt to follow through with GYN appointment. Will send urine for UA/Culture.

## 2013-07-04 NOTE — Progress Notes (Signed)
Pre visit review using our clinic review tool, if applicable. No additional management support is needed unless otherwise documented below in the visit note. 

## 2013-07-04 NOTE — Assessment & Plan Note (Signed)
BP stable on current meds, continue same.  

## 2013-07-04 NOTE — Assessment & Plan Note (Signed)
Stable- continue prn proair.
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
## 2013-07-05 ENCOUNTER — Telehealth: Payer: Self-pay | Admitting: Family

## 2013-07-05 ENCOUNTER — Encounter: Payer: Self-pay | Admitting: Family

## 2013-07-05 DIAGNOSIS — E039 Hypothyroidism, unspecified: Secondary | ICD-10-CM

## 2013-07-05 LAB — URINALYSIS, ROUTINE W REFLEX MICROSCOPIC
Bilirubin Urine: NEGATIVE
Glucose, UA: NEGATIVE mg/dL
Hgb urine dipstick: NEGATIVE
Ketones, ur: NEGATIVE mg/dL
Leukocytes, UA: NEGATIVE
Nitrite: NEGATIVE
Protein, ur: NEGATIVE mg/dL
Specific Gravity, Urine: 1.023 (ref 1.005–1.030)
Urobilinogen, UA: 1 mg/dL (ref 0.0–1.0)
pH: 5 (ref 5.0–8.0)

## 2013-07-05 MED ORDER — LEVOTHYROXINE SODIUM 175 MCG PO TABS: 175.0000 ug | ORAL_TABLET | Freq: Every day | ORAL | Status: DC

## 2013-07-05 NOTE — Telephone Encounter (Signed)
Patient notified of results.

## 2013-07-05 NOTE — Telephone Encounter (Signed)
Sugar is normal. Thyroid testing shows that synthroid dose needs to be decreased.  Decrease synthroid from 223mcg to 175 mcg. Follow up TSH in 6 weeks please (dx hypothyroid)

## 2013-07-06 LAB — URINE CULTURE
Colony Count: NO GROWTH
Organism ID, Bacteria: NO GROWTH

## 2013-07-07 ENCOUNTER — Other Ambulatory Visit: Payer: Self-pay | Admitting: Gastroenterology

## 2013-07-07 DIAGNOSIS — R1013 Epigastric pain: Secondary | ICD-10-CM

## 2013-07-08 ENCOUNTER — Telehealth: Payer: Self-pay | Admitting: Hematology & Oncology

## 2013-07-08 NOTE — Telephone Encounter (Signed)
Left message with 7-15 time change

## 2013-07-13 ENCOUNTER — Telehealth: Payer: Self-pay | Admitting: *Deleted

## 2013-07-13 NOTE — Telephone Encounter (Signed)
Spoke with pt re: urine culture results. Pt states she continues to have intermittent episodes of abdominal pain and swelling that usually resolve in 30 minutes. Pt states she was not able to see the GYN we referred pt to as they do not accept her insurance. She has contacted [facility] and they are requiring her to complete some paperwork before they will schedule her an appt.  Pt is agreeable to be referred to another GYN that accepts medicare and medicaid if we know someone. Please advise if any other recommendations.

## 2013-07-14 NOTE — Telephone Encounter (Signed)
I would advise her to follow through with paperwork and scheduling at [place] and let me know if for some reason they are unable to see her.

## 2013-07-14 NOTE — Telephone Encounter (Signed)
Patient informed of Melissa's recommendation, she will go ahead with paperwork in hopes of getting an appointment scheduled. Patient advised to call back if they are not able to see her

## 2013-07-18 IMAGING — CR DG CHEST 2V
2 series · 2 of 2 positions shown · non-contrast
Comparison: 01/28/2011

CLINICAL DATA: Cough and wheezing.  Fever

CHEST - 2 VIEW

[w chest pa]
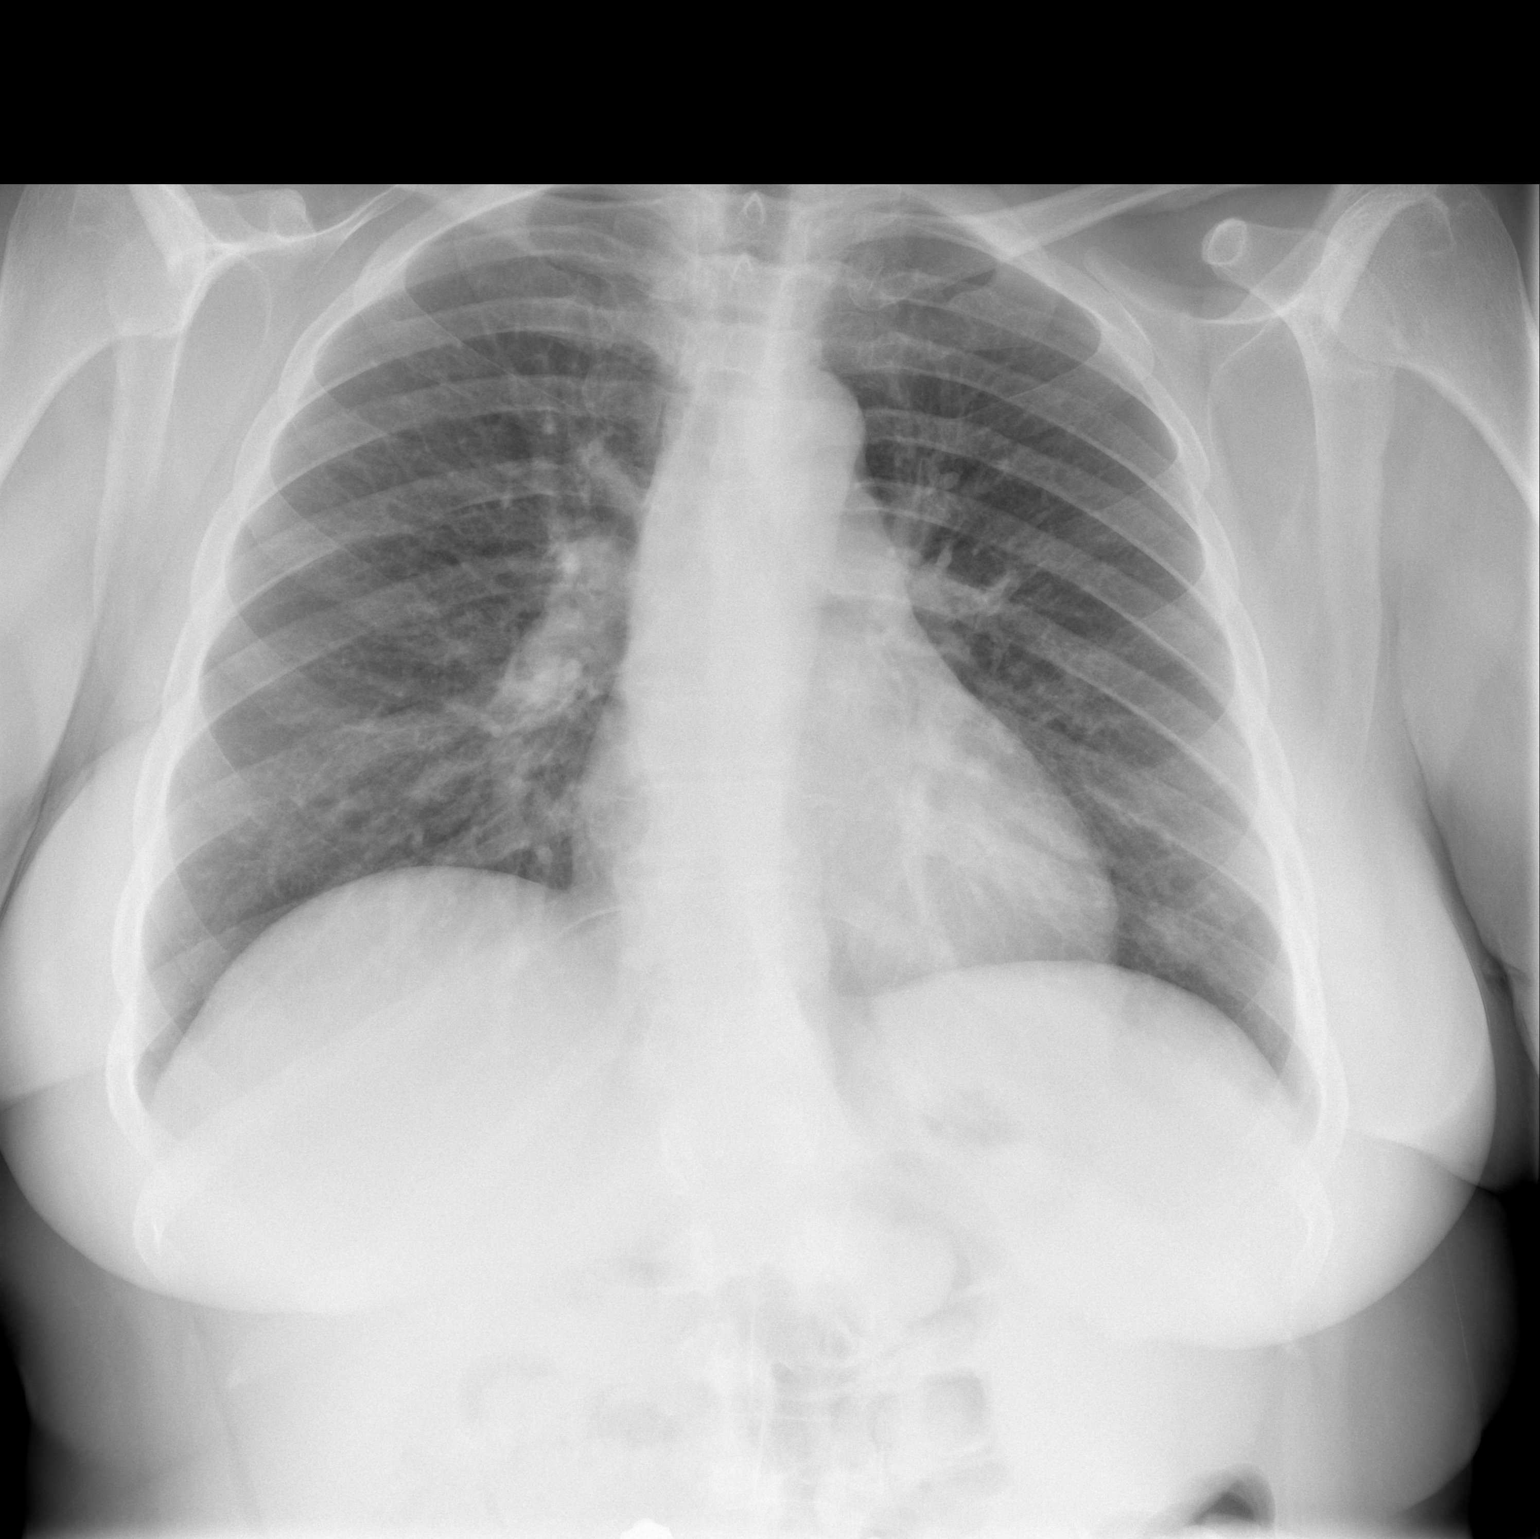

[w chest lat]
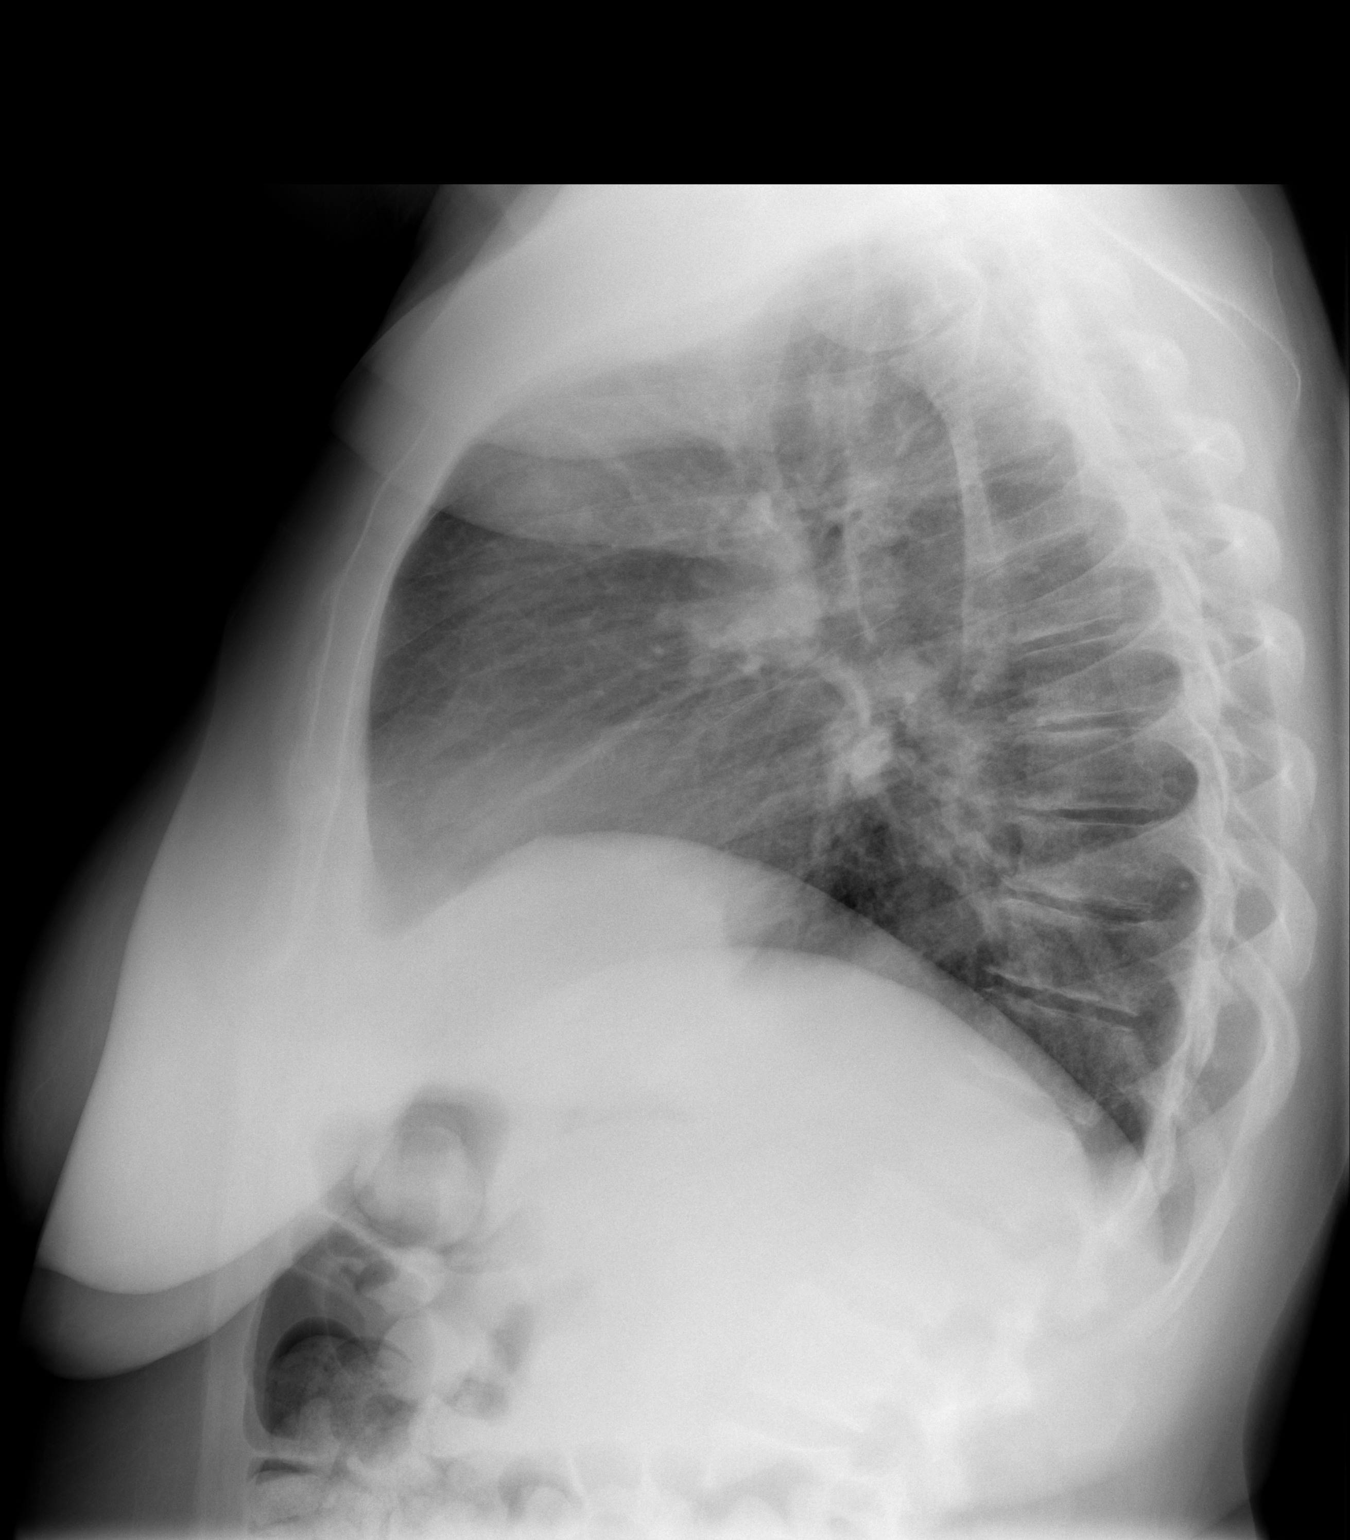

[2 of 2 positions shown; findings below may reference images not displayed]

FINDINGS: Heart size appears normal.

No pleural effusion or pulmonary edema.

No airspace consolidation identified.

Review of the visualized osseous structures is unremarkable.  There
is mild multilevel spondylosis within the thoracic spine.
IMPRESSION: 1.  No acute cardiopulmonary abnormalities.

## 2013-07-25 ENCOUNTER — Ambulatory Visit (HOSPITAL_COMMUNITY): Payer: PRIVATE HEALTH INSURANCE

## 2013-07-27 ENCOUNTER — Ambulatory Visit (HOSPITAL_BASED_OUTPATIENT_CLINIC_OR_DEPARTMENT_OTHER): Payer: PRIVATE HEALTH INSURANCE

## 2013-07-27 ENCOUNTER — Other Ambulatory Visit (HOSPITAL_BASED_OUTPATIENT_CLINIC_OR_DEPARTMENT_OTHER): Payer: PRIVATE HEALTH INSURANCE | Admitting: Lab

## 2013-07-27 VITALS — BP 119/83 | HR 59 | Temp 97.0°F | Resp 16

## 2013-07-27 DIAGNOSIS — D509 Iron deficiency anemia, unspecified: Secondary | ICD-10-CM

## 2013-07-27 DIAGNOSIS — D510 Vitamin B12 deficiency anemia due to intrinsic factor deficiency: Secondary | ICD-10-CM

## 2013-07-27 DIAGNOSIS — D508 Other iron deficiency anemias: Secondary | ICD-10-CM

## 2013-07-27 LAB — CBC WITH DIFFERENTIAL (CANCER CENTER ONLY)
BASO#: 0 10*3/uL (ref 0.0–0.2)
BASO%: 0.4 % (ref 0.0–2.0)
EOS%: 4.3 % (ref 0.0–7.0)
Eosinophils Absolute: 0.2 10*3/uL (ref 0.0–0.5)
HCT: 40.1 % (ref 34.8–46.6)
HGB: 13.8 g/dL (ref 11.6–15.9)
LYMPH#: 1.3 10*3/uL (ref 0.9–3.3)
LYMPH%: 26.8 % (ref 14.0–48.0)
MCH: 29.4 pg (ref 26.0–34.0)
MCHC: 34.4 g/dL (ref 32.0–36.0)
MCV: 85 fL (ref 81–101)
MONO#: 0.5 10*3/uL (ref 0.1–0.9)
MONO%: 10.1 % (ref 0.0–13.0)
NEUT#: 2.8 10*3/uL (ref 1.5–6.5)
NEUT%: 58.4 % (ref 39.6–80.0)
Platelets: 131 10*3/uL — ABNORMAL LOW (ref 145–400)
RBC: 4.7 10*6/uL (ref 3.70–5.32)
RDW: 13.2 % (ref 11.1–15.7)
WBC: 4.9 10*3/uL (ref 3.9–10.0)

## 2013-07-27 MED ORDER — CYANOCOBALAMIN 1000 MCG/ML IJ SOLN
INTRAMUSCULAR | Status: AC
  Filled 2013-07-27: qty 1

## 2013-07-27 MED ORDER — CYANOCOBALAMIN 1000 MCG/ML IJ SOLN
1000.0000 ug | Freq: Once | INTRAMUSCULAR | Status: AC
  Administered 2013-07-27: 1000 ug via INTRAMUSCULAR

## 2013-07-27 NOTE — Patient Instructions (Signed)

## 2013-08-09 ENCOUNTER — Other Ambulatory Visit (HOSPITAL_COMMUNITY): Payer: Self-pay | Admitting: Physical Medicine and Rehabilitation

## 2013-08-09 ENCOUNTER — Ambulatory Visit (HOSPITAL_COMMUNITY)
Admission: RE | Admit: 2013-08-09 | Discharge: 2013-08-09 | Disposition: A | Discharge status: Home / Self Care | Payer: PRIVATE HEALTH INSURANCE | Source: Ambulatory Visit | Attending: Physical Medicine and Rehabilitation | Admitting: Physical Medicine and Rehabilitation

## 2013-08-09 DIAGNOSIS — M25569 Pain in unspecified knee: Secondary | ICD-10-CM | POA: Insufficient documentation

## 2013-08-09 DIAGNOSIS — G8929 Other chronic pain: Secondary | ICD-10-CM | POA: Diagnosis not present

## 2013-08-09 DIAGNOSIS — R52 Pain, unspecified: Secondary | ICD-10-CM

## 2013-08-09 DIAGNOSIS — M259 Joint disorder, unspecified: Secondary | ICD-10-CM | POA: Diagnosis not present

## 2013-08-16 ENCOUNTER — Ambulatory Visit (HOSPITAL_COMMUNITY): Payer: PRIVATE HEALTH INSURANCE

## 2013-08-17 ENCOUNTER — Encounter: Payer: Self-pay | Admitting: Family

## 2013-08-17 ENCOUNTER — Ambulatory Visit (HOSPITAL_BASED_OUTPATIENT_CLINIC_OR_DEPARTMENT_OTHER)
Admission: RE | Admit: 2013-08-17 | Discharge: 2013-08-17 | Disposition: A | Discharge status: Home / Self Care | Payer: PRIVATE HEALTH INSURANCE | Source: Ambulatory Visit | Attending: Family | Admitting: Family

## 2013-08-17 ENCOUNTER — Ambulatory Visit (INDEPENDENT_AMBULATORY_CARE_PROVIDER_SITE_OTHER): Payer: PRIVATE HEALTH INSURANCE | Admitting: Family

## 2013-08-17 VITALS — BP 140/70 | HR 76 | Temp 98.0°F | Resp 16 | Ht 67.0 in | Wt 323.0 lb

## 2013-08-17 DIAGNOSIS — Z720 Tobacco use: Secondary | ICD-10-CM

## 2013-08-17 DIAGNOSIS — R609 Edema, unspecified: Secondary | ICD-10-CM

## 2013-08-17 DIAGNOSIS — M7989 Other specified soft tissue disorders: Secondary | ICD-10-CM

## 2013-08-17 DIAGNOSIS — J45909 Unspecified asthma, uncomplicated: Secondary | ICD-10-CM

## 2013-08-17 DIAGNOSIS — F17200 Nicotine dependence, unspecified, uncomplicated: Secondary | ICD-10-CM

## 2013-08-17 DIAGNOSIS — R10819 Abdominal tenderness, unspecified site: Secondary | ICD-10-CM

## 2013-08-17 DIAGNOSIS — I10 Essential (primary) hypertension: Secondary | ICD-10-CM

## 2013-08-17 LAB — BASIC METABOLIC PANEL
BUN: 11 mg/dL (ref 6–23)
CO2: 30 mEq/L (ref 19–32)
Calcium: 8.8 mg/dL (ref 8.4–10.5)
Chloride: 104 mEq/L (ref 96–112)
Creat: 0.61 mg/dL (ref 0.50–1.10)
Glucose, Bld: 114 mg/dL — ABNORMAL HIGH (ref 70–99)
Potassium: 4.2 mEq/L (ref 3.5–5.3)
Sodium: 138 mEq/L (ref 135–145)
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
## 2013-08-17 NOTE — Patient Instructions (Signed)
Complete lab work prior to leaving. Schedule exam for Pap.

## 2013-08-18 ENCOUNTER — Telehealth: Payer: Self-pay | Admitting: Family

## 2013-08-18 NOTE — Telephone Encounter (Signed)
Relevant patient education mailed to patient.  

## 2013-08-20 ENCOUNTER — Encounter: Payer: Self-pay | Admitting: Family

## 2013-08-22 DIAGNOSIS — R609 Edema, unspecified: Secondary | ICD-10-CM | POA: Insufficient documentation

## 2013-08-22 DIAGNOSIS — Z720 Tobacco use: Secondary | ICD-10-CM | POA: Insufficient documentation

## 2013-08-22 NOTE — Assessment & Plan Note (Signed)
She declines GYN referral and would like [place] to perform pelvic/pap. Advised pt to arrange a follow up visit for this.

## 2013-08-22 NOTE — Assessment & Plan Note (Signed)
I commended pt on her recent decision to quit smoking and encouraged her to remain quit.

## 2013-08-22 NOTE — Assessment & Plan Note (Signed)
Stable, continue current meds 

## 2013-08-22 NOTE — Assessment & Plan Note (Signed)
RLE doppler is performed and is negative for DVT.

## 2013-08-22 NOTE — Assessment & Plan Note (Signed)
Fair BP today, continue current meds.

## 2013-08-31 ENCOUNTER — Ambulatory Visit (HOSPITAL_BASED_OUTPATIENT_CLINIC_OR_DEPARTMENT_OTHER): Payer: PRIVATE HEALTH INSURANCE

## 2013-08-31 ENCOUNTER — Other Ambulatory Visit (HOSPITAL_BASED_OUTPATIENT_CLINIC_OR_DEPARTMENT_OTHER): Payer: PRIVATE HEALTH INSURANCE | Admitting: Lab

## 2013-08-31 VITALS — BP 124/86 | HR 52 | Temp 97.9°F | Resp 16

## 2013-08-31 DIAGNOSIS — D510 Vitamin B12 deficiency anemia due to intrinsic factor deficiency: Secondary | ICD-10-CM

## 2013-08-31 DIAGNOSIS — D509 Iron deficiency anemia, unspecified: Secondary | ICD-10-CM

## 2013-08-31 DIAGNOSIS — D508 Other iron deficiency anemias: Secondary | ICD-10-CM

## 2013-08-31 LAB — CBC WITH DIFFERENTIAL (CANCER CENTER ONLY)
BASO#: 0 10*3/uL (ref 0.0–0.2)
BASO%: 0.2 % (ref 0.0–2.0)
EOS%: 3.4 % (ref 0.0–7.0)
Eosinophils Absolute: 0.1 10*3/uL (ref 0.0–0.5)
HCT: 40.3 % (ref 34.8–46.6)
HGB: 13.8 g/dL (ref 11.6–15.9)
LYMPH#: 1 10*3/uL (ref 0.9–3.3)
LYMPH%: 25.2 % (ref 14.0–48.0)
MCH: 29.2 pg (ref 26.0–34.0)
MCHC: 34.2 g/dL (ref 32.0–36.0)
MCV: 85 fL (ref 81–101)
MONO#: 0.3 10*3/uL (ref 0.1–0.9)
MONO%: 7 % (ref 0.0–13.0)
NEUT#: 2.6 10*3/uL (ref 1.5–6.5)
NEUT%: 64.2 % (ref 39.6–80.0)
Platelets: 110 10*3/uL — ABNORMAL LOW (ref 145–400)
RBC: 4.72 10*6/uL (ref 3.70–5.32)
RDW: 13.9 % (ref 11.1–15.7)
WBC: 4.1 10*3/uL (ref 3.9–10.0)

## 2013-08-31 MED ORDER — CYANOCOBALAMIN 1000 MCG/ML IJ SOLN
INTRAMUSCULAR | Status: AC
  Filled 2013-08-31: qty 1

## 2013-08-31 MED ORDER — CYANOCOBALAMIN 1000 MCG/ML IJ SOLN
1000.0000 ug | Freq: Once | INTRAMUSCULAR | Status: AC
  Administered 2013-08-31: 1000 ug via INTRAMUSCULAR

## 2013-08-31 NOTE — Patient Instructions (Signed)

## 2013-09-05 ENCOUNTER — Ambulatory Visit (HOSPITAL_COMMUNITY)
Admission: RE | Admit: 2013-09-05 | Discharge: 2013-09-05 | Disposition: A | Discharge status: Home / Self Care | Payer: PRIVATE HEALTH INSURANCE | Source: Ambulatory Visit | Attending: Gastroenterology | Admitting: Gastroenterology

## 2013-09-05 DIAGNOSIS — R1013 Epigastric pain: Secondary | ICD-10-CM

## 2013-09-08 ENCOUNTER — Other Ambulatory Visit: Payer: Self-pay | Admitting: Family

## 2013-09-14 ENCOUNTER — Ambulatory Visit (HOSPITAL_BASED_OUTPATIENT_CLINIC_OR_DEPARTMENT_OTHER)
Admission: RE | Admit: 2013-09-14 | Discharge: 2013-09-14 | Disposition: A | Discharge status: Home / Self Care | Payer: PRIVATE HEALTH INSURANCE | Source: Ambulatory Visit | Attending: Family | Admitting: Family

## 2013-09-14 ENCOUNTER — Encounter: Payer: Self-pay | Admitting: Family

## 2013-09-14 ENCOUNTER — Telehealth: Payer: Self-pay | Admitting: Family

## 2013-09-14 ENCOUNTER — Ambulatory Visit (INDEPENDENT_AMBULATORY_CARE_PROVIDER_SITE_OTHER): Payer: PRIVATE HEALTH INSURANCE | Admitting: Family

## 2013-09-14 VITALS — BP 130/80 | HR 52 | Temp 97.9°F | Resp 16 | Ht 67.0 in | Wt 314.4 lb

## 2013-09-14 DIAGNOSIS — R079 Chest pain, unspecified: Secondary | ICD-10-CM

## 2013-09-14 DIAGNOSIS — Z23 Encounter for immunization: Secondary | ICD-10-CM

## 2013-09-14 DIAGNOSIS — M546 Pain in thoracic spine: Secondary | ICD-10-CM

## 2013-09-14 MED ORDER — METHYLPREDNISOLONE 4 MG PO KIT: PACK | ORAL | Status: DC
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
## 2013-09-14 NOTE — Telephone Encounter (Signed)
Spine x ray shows degenerative changes only. CXR normal, no rib fractures. Rec medrol dose pak as we discussed at her visit.

## 2013-09-14 NOTE — Telephone Encounter (Signed)
Notified pt and she voices understanding. 

## 2013-09-14 NOTE — Patient Instructions (Signed)
Please complete x rays on the first floor. Start medrol dose pak for back pain. Call if symptoms worsen or if not improved in 1 week.

## 2013-09-14 NOTE — Progress Notes (Signed)
Pre visit review using our clinic review tool, if applicable. No additional management support is needed unless otherwise documented below in the visit note. 
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
## 2013-09-16 ENCOUNTER — Encounter (HOSPITAL_COMMUNITY): Admission: RE | Admit: 2013-09-16 | Payer: PRIVATE HEALTH INSURANCE | Source: Ambulatory Visit

## 2013-09-21 ENCOUNTER — Telehealth: Payer: Self-pay | Admitting: Family

## 2013-09-21 NOTE — Telephone Encounter (Signed)
Caller name: Dudley  Call back [phone]   Reason for call:  Pt states she is still having bad headaches.  Took steroid, but it did not help.  Would like to speak with Trish or Melissa about what to do.

## 2013-09-21 NOTE — Telephone Encounter (Signed)
Spoke with pt. Reports continued pain in middle of spine. Has completed Prednisone on 09/20/13.  Has daily headache since the fall. Takes tylenol and it eases the headache but does not take it away completely. Pt unable to check BP at this time but states she will call me back later with the reading. Pt wants to know how long it should take to see improvement in her back pain and headache?  Please advise.

## 2013-09-21 NOTE — Telephone Encounter (Signed)
She can try adding claritin for headache, this may help. Let me know how she does with claritin.  If back pain is not improved in 1 week, I recommend that she contact her neurosurgeon.

## 2013-09-21 NOTE — Telephone Encounter (Signed)
Notified pt and she voices understanding. 

## 2013-09-23 ENCOUNTER — Encounter (HOSPITAL_COMMUNITY)
Admission: RE | Admit: 2013-09-23 | Discharge: 2013-09-23 | Disposition: A | Discharge status: Home / Self Care | Payer: PRIVATE HEALTH INSURANCE | Source: Ambulatory Visit | Attending: Diagnostic Radiology | Admitting: Diagnostic Radiology

## 2013-09-23 DIAGNOSIS — K319 Disease of stomach and duodenum, unspecified: Secondary | ICD-10-CM | POA: Insufficient documentation

## 2013-09-23 DIAGNOSIS — R109 Unspecified abdominal pain: Secondary | ICD-10-CM | POA: Diagnosis present

## 2013-09-23 MED ORDER — TECHNETIUM TC 99M SULFUR COLLOID: 2.2000 | Freq: Once | INTRAVENOUS | Status: AC | PRN

## 2013-09-24 ENCOUNTER — Other Ambulatory Visit: Payer: Self-pay | Admitting: Family

## 2013-09-28 ENCOUNTER — Ambulatory Visit (HOSPITAL_BASED_OUTPATIENT_CLINIC_OR_DEPARTMENT_OTHER): Payer: PRIVATE HEALTH INSURANCE | Admitting: Hematology & Oncology

## 2013-09-28 ENCOUNTER — Ambulatory Visit: Payer: PRIVATE HEALTH INSURANCE | Admitting: Family

## 2013-09-28 ENCOUNTER — Other Ambulatory Visit (HOSPITAL_BASED_OUTPATIENT_CLINIC_OR_DEPARTMENT_OTHER): Payer: PRIVATE HEALTH INSURANCE | Admitting: Lab

## 2013-09-28 ENCOUNTER — Ambulatory Visit (HOSPITAL_BASED_OUTPATIENT_CLINIC_OR_DEPARTMENT_OTHER): Payer: PRIVATE HEALTH INSURANCE

## 2013-09-28 ENCOUNTER — Encounter: Payer: Self-pay | Admitting: Hematology & Oncology

## 2013-09-28 VITALS — BP 133/63 | HR 73 | Temp 97.8°F | Resp 16 | Ht 67.0 in | Wt 312.0 lb

## 2013-09-28 DIAGNOSIS — D510 Vitamin B12 deficiency anemia due to intrinsic factor deficiency: Secondary | ICD-10-CM

## 2013-09-28 DIAGNOSIS — D508 Other iron deficiency anemias: Secondary | ICD-10-CM

## 2013-09-28 DIAGNOSIS — E039 Hypothyroidism, unspecified: Secondary | ICD-10-CM

## 2013-09-28 LAB — CBC WITH DIFFERENTIAL (CANCER CENTER ONLY)
BASO#: 0 10*3/uL (ref 0.0–0.2)
BASO%: 0.4 % (ref 0.0–2.0)
EOS%: 3.6 % (ref 0.0–7.0)
Eosinophils Absolute: 0.2 10*3/uL (ref 0.0–0.5)
HCT: 39.8 % (ref 34.8–46.6)
HGB: 13.8 g/dL (ref 11.6–15.9)
LYMPH#: 1.1 10*3/uL (ref 0.9–3.3)
LYMPH%: 23.5 % (ref 14.0–48.0)
MCH: 29.5 pg (ref 26.0–34.0)
MCHC: 34.7 g/dL (ref 32.0–36.0)
MCV: 85 fL (ref 81–101)
MONO#: 0.3 10*3/uL (ref 0.1–0.9)
MONO%: 6.7 % (ref 0.0–13.0)
NEUT#: 3.1 10*3/uL (ref 1.5–6.5)
NEUT%: 65.8 % (ref 39.6–80.0)
Platelets: 116 10*3/uL — ABNORMAL LOW (ref 145–400)
RBC: 4.68 10*6/uL (ref 3.70–5.32)
RDW: 14.1 % (ref 11.1–15.7)
WBC: 4.8 10*3/uL (ref 3.9–10.0)

## 2013-09-28 LAB — CHCC SATELLITE - SMEAR

## 2013-09-28 LAB — VITAMIN B12: Vitamin B-12: 384 pg/mL (ref 211–911)

## 2013-09-28 LAB — RETICULOCYTES (CHCC)
ABS Retic: 61.9 10*3/uL (ref 19.0–186.0)
RBC.: 4.76 MIL/uL (ref 3.87–5.11)
Retic Ct Pct: 1.3 % (ref 0.4–2.3)

## 2013-09-28 MED ORDER — CYANOCOBALAMIN 1000 MCG/ML IJ SOLN
INTRAMUSCULAR | Status: AC
  Filled 2013-09-28: qty 1

## 2013-09-28 MED ORDER — CYANOCOBALAMIN 1000 MCG/ML IJ SOLN
1000.0000 ug | Freq: Once | INTRAMUSCULAR | Status: AC
  Administered 2013-09-28: 1000 ug via INTRAMUSCULAR

## 2013-09-28 NOTE — Progress Notes (Signed)
Hematology and Oncology Follow Up Visit  Megan [name] [facility] [id number] 1965-05-06 48 y.o. 09/28/2013   Principle Diagnosis:  1. Recurrent iron-deficiency anemia. 2. Menometrorrhagia. 3. Morbid obesity. 4. pernicious anemia  Current Therapy:   IV iron as indicated Vitamin B12 1 mg IM every month       Interim History:  [name] is is back for followup. She fell a week or so ago. She is having a lot of back discomfort. She needs to go back to see her spine surgeon. She hasn't seen Megan [name] in the past. We will have to make an appointment for her. getting her an appointment to see Megan [name].  She has headaches. She does not feel well. [name] She is worried as she missed some school issues not get down some of a back pain better.  Back in June, her ferritin was 144 with iron saturation 27%.  She has not had any problems with bleeding. She has had no problems with bowels or bladder. She has not had any nausea or vomiting.    Medications: Current outpatient prescriptions:aspirin 81 MG tablet, Take 81 mg by mouth daily. , Disp: , Rfl: ;  cyanocobalamin (,VITAMIN B-12,) 1000 MCG/ML injection, Inject 1,000 mcg into the muscle every 30 (thirty) days., Disp: , Rfl: ;  Ipratropium-Albuterol (COMBIVENT) 20-100 MCG/ACT AERS respimat, Inhale 1 puff into the lungs every 6 (six) hours as needed for wheezing., Disp: 4 g, Rfl: 3 lisinopril (PRINIVIL,ZESTRIL) 20 MG tablet, TAKE 1 TABLET (20 MG TOTAL) BY MOUTH DAILY., Disp: 30 tablet, Rfl: 3;  LYRICA 75 MG capsule, Take 50 mg by mouth 2 (two) times daily. , Disp: , Rfl: ;  metoprolol tartrate (LOPRESSOR) 25 MG tablet, Take 1 tablet (25 mg total) by mouth 2 (two) times daily., Disp: 60 tablet, Rfl: 2;  mupirocin ointment (BACTROBAN) 2 %, Place 1 application into the nose 2 (two) times daily., Disp: 22 g, Rfl: 0 ondansetron (ZOFRAN ODT) 8 MG disintegrating tablet, Take 8 mg by mouth as needed for nausea or vomiting., Disp: , Rfl: ;   Oxycodone HCl 20 MG TABS, Take 20 mg by mouth 4 (four) times daily., Disp: , Rfl: ;  PROAIR HFA 108 (90 BASE) MCG/ACT inhaler, INHALE 2 PUFFS INTO THE LUNGS EVERY 6 (SIX) HOURS AS NEEDED. FOR WHEEZING, Disp: 8.5 each, Rfl: 1 promethazine (PHENERGAN) 50 MG tablet, Take 50 mg by mouth every 6 (six) hours as needed. For nausea, Disp: , Rfl: ;  SYNTHROID 175 MCG tablet, TAKE 1 TABLET (175 MCG TOTAL) BY MOUTH DAILY BEFORE BREAKFAST., Disp: 30 tablet, Rfl: 2  Allergies:  Allergies  Allergen Reactions  . Hydrocodone Anaphylaxis  . Penicillins Rash    Throat swells up as well  . Gadolinium Derivatives     Reactions have happened twice, after leaving facility. Pt reports feeling hot in trunk but extremities were icy cold; shivering, vomiting the first time after receiving contrast.  Symptoms lasted 12-18 hours.    . Chantix [Varenicline] Nausea Only  . Cymbalta [Duloxetine Hcl]     headach  . Morphine Other (See Comments)    REACTION: irregular heart beat  . Codeine Rash    Past Medical History, Surgical history, Social history, and Family History were reviewed and updated.  Review of Systems: As above  Physical Exam:  height is 5\' 7"  (1.702 m) and weight is 312 lb (141.522 kg). Her oral temperature is 97.8 F (36.6 C). Her blood pressure is 133/63 and  her pulse is 73. Her respiration is 16.   Obese white female in no obvious distress. Head and neck exam shows no ocular or oral lesions. There are no palpable cervical or supraclavicular lymph nodes. Lungs are clear. Cardiac exam regular in rhythm with no murmurs rubs or bruits. Abdomen is soft. Has good bowel sounds. There is no fluid wave. There is no palpable liver or spleen tip. Exam shows laminectomy scar in the lumbar spine. Extremities shows no clubbing cyanosis or edema. Skin exam no rashes. Neurological exam is nonfocal.  Lab Results  Component Value Date   WBC 4.8 09/28/2013   HGB 13.8 09/28/2013   HCT 39.8 09/28/2013   MCV 85  09/28/2013   PLT 116* 09/28/2013     Chemistry      Component Value Date/Time   NA 138 08/17/2013 1420   K 4.2 08/17/2013 1420   CL 104 08/17/2013 1420   CO2 30 08/17/2013 1420   BUN 11 08/17/2013 1420   CREATININE 0.61 08/17/2013 1420   CREATININE 0.69 07/19/2012 1523      Component Value Date/Time   CALCIUM 8.8 08/17/2013 1420   ALKPHOS 93 03/22/2013 1627   AST 28 03/22/2013 1627   ALT 23 03/22/2013 1627   BILITOT 0.5 03/22/2013 1627         Impression and Plan: Megan [name] is a 48 year old white female. She has multifactorial anemia. She does have pernicious anemia. We will go ahead and give her B12.  We will see what her iron studies show. I think that she has not had iron for a while. We will have to see about getting the neurosurgeon to see her again.  I will plan to probably get her back to see [place] in another couple of months.                                                                    Volanda Napoleon, MD 9/16/20153:38 PM

## 2013-09-28 NOTE — Patient Instructions (Signed)

## 2013-09-29 LAB — IRON AND TIBC CHCC
%SAT: 42 % (ref 21–57)
Iron: 108 ug/dL (ref 41–142)
TIBC: 260 ug/dL (ref 236–444)
UIBC: 152 ug/dL (ref 120–384)

## 2013-09-29 LAB — TSH CHCC: TSH: 0.568 m(IU)/L (ref 0.308–3.960)

## 2013-09-29 LAB — FERRITIN CHCC: Ferritin: 129 ng/ml (ref 9–269)

## 2013-09-30 ENCOUNTER — Telehealth: Payer: Self-pay | Admitting: *Deleted

## 2013-09-30 NOTE — Telephone Encounter (Signed)
Message copied by Rico Ala on Fri Sep 30, 2013  4:22 PM ------      Message from: Burney Gauze R      Created: Thu Sep 29, 2013  5:46 PM       Call - iron is ok!!!  Pete ------

## 2013-10-10 ENCOUNTER — Ambulatory Visit (HOSPITAL_COMMUNITY)
Admission: RE | Admit: 2013-10-10 | Discharge: 2013-10-10 | Disposition: A | Discharge status: Home / Self Care | Payer: PRIVATE HEALTH INSURANCE | Source: Ambulatory Visit | Attending: Physical Medicine and Rehabilitation | Admitting: Physical Medicine and Rehabilitation

## 2013-10-10 ENCOUNTER — Other Ambulatory Visit (HOSPITAL_COMMUNITY): Payer: Self-pay | Admitting: Physical Medicine and Rehabilitation

## 2013-10-10 DIAGNOSIS — M545 Low back pain, unspecified: Secondary | ICD-10-CM | POA: Diagnosis not present

## 2013-10-10 DIAGNOSIS — R51 Headache: Secondary | ICD-10-CM | POA: Diagnosis present

## 2013-10-10 DIAGNOSIS — S0993XA Unspecified injury of face, initial encounter: Secondary | ICD-10-CM | POA: Diagnosis not present

## 2013-10-10 DIAGNOSIS — M5030 Other cervical disc degeneration, unspecified cervical region: Secondary | ICD-10-CM | POA: Insufficient documentation

## 2013-10-10 DIAGNOSIS — S199XXA Unspecified injury of neck, initial encounter: Principal | ICD-10-CM

## 2013-10-10 DIAGNOSIS — W19XXXA Unspecified fall, initial encounter: Secondary | ICD-10-CM | POA: Diagnosis not present

## 2013-10-10 DIAGNOSIS — M542 Cervicalgia: Secondary | ICD-10-CM | POA: Diagnosis present

## 2013-10-26 ENCOUNTER — Ambulatory Visit (HOSPITAL_BASED_OUTPATIENT_CLINIC_OR_DEPARTMENT_OTHER): Payer: PRIVATE HEALTH INSURANCE

## 2013-10-26 VITALS — BP 121/75 | HR 64 | Temp 98.4°F | Resp 16

## 2013-10-26 DIAGNOSIS — D510 Vitamin B12 deficiency anemia due to intrinsic factor deficiency: Secondary | ICD-10-CM

## 2013-10-26 MED ORDER — CYANOCOBALAMIN 1000 MCG/ML IJ SOLN
1000.0000 ug | Freq: Once | INTRAMUSCULAR | Status: AC
  Administered 2013-10-26: 1000 ug via INTRAMUSCULAR

## 2013-10-26 MED ORDER — CYANOCOBALAMIN 1000 MCG/ML IJ SOLN
INTRAMUSCULAR | Status: AC
  Filled 2013-10-26: qty 1

## 2013-10-26 NOTE — Patient Instructions (Signed)

## 2013-11-23 ENCOUNTER — Other Ambulatory Visit (HOSPITAL_BASED_OUTPATIENT_CLINIC_OR_DEPARTMENT_OTHER): Payer: PRIVATE HEALTH INSURANCE | Admitting: Lab

## 2013-11-23 ENCOUNTER — Ambulatory Visit (HOSPITAL_BASED_OUTPATIENT_CLINIC_OR_DEPARTMENT_OTHER): Payer: PRIVATE HEALTH INSURANCE | Admitting: Family

## 2013-11-23 ENCOUNTER — Ambulatory Visit (HOSPITAL_BASED_OUTPATIENT_CLINIC_OR_DEPARTMENT_OTHER): Payer: PRIVATE HEALTH INSURANCE

## 2013-11-23 ENCOUNTER — Encounter: Payer: Self-pay | Admitting: Family

## 2013-11-23 DIAGNOSIS — D510 Vitamin B12 deficiency anemia due to intrinsic factor deficiency: Secondary | ICD-10-CM

## 2013-11-23 DIAGNOSIS — D508 Other iron deficiency anemias: Secondary | ICD-10-CM

## 2013-11-23 DIAGNOSIS — D509 Iron deficiency anemia, unspecified: Secondary | ICD-10-CM

## 2013-11-23 LAB — CBC WITH DIFFERENTIAL (CANCER CENTER ONLY)
BASO#: 0 10*3/uL (ref 0.0–0.2)
BASO%: 0.6 % (ref 0.0–2.0)
EOS%: 3.6 % (ref 0.0–7.0)
Eosinophils Absolute: 0.2 10*3/uL (ref 0.0–0.5)
HCT: 39.1 % (ref 34.8–46.6)
HGB: 13.3 g/dL (ref 11.6–15.9)
LYMPH#: 1.3 10*3/uL (ref 0.9–3.3)
LYMPH%: 26.7 % (ref 14.0–48.0)
MCH: 29.6 pg (ref 26.0–34.0)
MCHC: 34 g/dL (ref 32.0–36.0)
MCV: 87 fL (ref 81–101)
MONO#: 0.4 10*3/uL (ref 0.1–0.9)
MONO%: 7.9 % (ref 0.0–13.0)
NEUT#: 2.9 10*3/uL (ref 1.5–6.5)
NEUT%: 61.2 % (ref 39.6–80.0)
Platelets: 126 10*3/uL — ABNORMAL LOW (ref 145–400)
RBC: 4.5 10*6/uL (ref 3.70–5.32)
RDW: 13.8 % (ref 11.1–15.7)
WBC: 4.7 10*3/uL (ref 3.9–10.0)

## 2013-11-23 LAB — RETICULOCYTES (CHCC)
ABS Retic: 63.3 10*3/uL (ref 19.0–186.0)
RBC.: 4.52 MIL/uL (ref 3.87–5.11)
Retic Ct Pct: 1.4 % (ref 0.4–2.3)

## 2013-11-23 LAB — VITAMIN B12: Vitamin B-12: 476 pg/mL (ref 211–911)

## 2013-11-23 MED ORDER — CYANOCOBALAMIN 1000 MCG/ML IJ SOLN
INTRAMUSCULAR | Status: AC
  Filled 2013-11-23: qty 1

## 2013-11-23 MED ORDER — CYANOCOBALAMIN 1000 MCG/ML IJ SOLN
1000.0000 ug | Freq: Once | INTRAMUSCULAR | Status: AC
  Administered 2013-11-23: 1000 ug via INTRAMUSCULAR

## 2013-11-23 NOTE — Patient Instructions (Signed)
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
## 2013-11-24 LAB — IRON AND TIBC CHCC
%SAT: 25 % (ref 21–57)
Iron: 70 ug/dL (ref 41–142)
TIBC: 281 ug/dL (ref 236–444)
UIBC: 212 ug/dL (ref 120–384)

## 2013-11-24 LAB — FERRITIN CHCC: Ferritin: 96 ng/ml (ref 9–269)

## 2013-11-25 ENCOUNTER — Telehealth: Payer: Self-pay | Admitting: *Deleted

## 2013-11-25 ENCOUNTER — Telehealth: Payer: Self-pay | Admitting: Hematology & Oncology

## 2013-11-25 NOTE — Telephone Encounter (Addendum)
Notified patient and scheduler.   ----- Message from Volanda Napoleon, MD sent at 11/24/2013  5:52 PM EST ----- Please call and let him know that the iron level is dropping. We need Feraheme 1020 mg IV 1 dose. Please set this up at her convenience. Thanks. Laurey Arrow

## 2013-11-25 NOTE — Telephone Encounter (Signed)
Pt and Baxter Flattery aware of 11-23 iron,

## 2013-11-30 ENCOUNTER — Ambulatory Visit: Payer: PRIVATE HEALTH INSURANCE | Admitting: Medical

## 2013-12-05 ENCOUNTER — Ambulatory Visit: Payer: PRIVATE HEALTH INSURANCE

## 2013-12-07 ENCOUNTER — Other Ambulatory Visit: Payer: Self-pay | Admitting: Family

## 2013-12-07 ENCOUNTER — Other Ambulatory Visit: Payer: Self-pay | Admitting: Obstetrics and Gynecology

## 2013-12-07 DIAGNOSIS — R928 Other abnormal and inconclusive findings on diagnostic imaging of breast: Secondary | ICD-10-CM

## 2013-12-14 ENCOUNTER — Ambulatory Visit: Payer: PRIVATE HEALTH INSURANCE | Admitting: Medical

## 2013-12-16 ENCOUNTER — Ambulatory Visit: Payer: PRIVATE HEALTH INSURANCE | Admitting: Family

## 2013-12-19 ENCOUNTER — Other Ambulatory Visit: Payer: Self-pay | Admitting: Physical Medicine and Rehabilitation

## 2013-12-19 DIAGNOSIS — M542 Cervicalgia: Secondary | ICD-10-CM

## 2013-12-20 ENCOUNTER — Telehealth: Payer: Self-pay | Admitting: Hematology & Oncology

## 2013-12-20 NOTE — Telephone Encounter (Signed)
Patient came into the office and she resch her 12-05-13 cx apt for 01/02/14

## 2013-12-21 ENCOUNTER — Ambulatory Visit: Payer: PRIVATE HEALTH INSURANCE

## 2013-12-27 ENCOUNTER — Ambulatory Visit
Admission: RE | Admit: 2013-12-27 | Discharge: 2013-12-27 | Disposition: A | Discharge status: Home / Self Care | Payer: PRIVATE HEALTH INSURANCE | Source: Ambulatory Visit | Attending: Obstetrics and Gynecology | Admitting: Obstetrics and Gynecology

## 2013-12-27 DIAGNOSIS — R928 Other abnormal and inconclusive findings on diagnostic imaging of breast: Secondary | ICD-10-CM

## 2014-01-02 ENCOUNTER — Ambulatory Visit (HOSPITAL_BASED_OUTPATIENT_CLINIC_OR_DEPARTMENT_OTHER): Payer: PRIVATE HEALTH INSURANCE

## 2014-01-02 DIAGNOSIS — D510 Vitamin B12 deficiency anemia due to intrinsic factor deficiency: Secondary | ICD-10-CM

## 2014-01-02 MED ORDER — CYANOCOBALAMIN 1000 MCG/ML IJ SOLN
1000.0000 ug | Freq: Once | INTRAMUSCULAR | Status: AC
  Administered 2014-01-02: 1000 ug via INTRAMUSCULAR

## 2014-01-02 MED ORDER — CYANOCOBALAMIN 1000 MCG/ML IJ SOLN
INTRAMUSCULAR | Status: AC
  Filled 2014-01-02: qty 1

## 2014-01-02 MED ORDER — SODIUM CHLORIDE 0.9 % IV SOLN: 510.0000 mg | Freq: Once | INTRAVENOUS | Status: DC

## 2014-01-02 NOTE — Progress Notes (Signed)
Pt would like to re-schedule the iron infusion, not feeling well today.  States will reschedule for next week.

## 2014-01-02 NOTE — Patient Instructions (Signed)

## 2014-01-14 ENCOUNTER — Ambulatory Visit
Admission: RE | Admit: 2014-01-14 | Discharge: 2014-01-14 | Disposition: A | Discharge status: Home / Self Care | Payer: PRIVATE HEALTH INSURANCE | Source: Ambulatory Visit | Attending: Physical Medicine and Rehabilitation | Admitting: Physical Medicine and Rehabilitation

## 2014-01-14 DIAGNOSIS — M542 Cervicalgia: Secondary | ICD-10-CM

## 2014-01-17 ENCOUNTER — Other Ambulatory Visit: Payer: Self-pay | Admitting: Family

## 2014-01-17 ENCOUNTER — Telehealth: Payer: Self-pay | Admitting: Hematology & Oncology

## 2014-01-17 NOTE — Telephone Encounter (Signed)
Patient called and sch Iron apt for 01/20/14.  She also cx 01/25/14 and resch for 01/27/14

## 2014-01-18 NOTE — Telephone Encounter (Signed)
Please call pt to arrange f/u in March.

## 2014-01-18 NOTE — Telephone Encounter (Signed)
30 day supply lisinopril sent to pharmacy. Pt last seen by PCP 09/2013 and has no future appts scheduled.  When should pt follow up in the office?

## 2014-01-18 NOTE — Telephone Encounter (Signed)
Follow up in march please.

## 2014-01-19 NOTE — Telephone Encounter (Signed)
Called patient to inform her of this. She states that she needs to come in sooner because she hasn't been feeling well since Christmas. Offered patient to be seen tomorrow by one of our other providers for tomorrow patient declined. Patient wanted 02/03/14 appointment. Appointment scheduled

## 2014-01-20 ENCOUNTER — Ambulatory Visit (HOSPITAL_BASED_OUTPATIENT_CLINIC_OR_DEPARTMENT_OTHER): Payer: 59

## 2014-01-20 ENCOUNTER — Telehealth: Payer: Self-pay | Admitting: Hematology & Oncology

## 2014-01-20 VITALS — BP 124/79 | HR 60 | Temp 98.2°F | Resp 16

## 2014-01-20 DIAGNOSIS — D508 Other iron deficiency anemias: Secondary | ICD-10-CM

## 2014-01-20 DIAGNOSIS — D510 Vitamin B12 deficiency anemia due to intrinsic factor deficiency: Secondary | ICD-10-CM

## 2014-01-20 DIAGNOSIS — D509 Iron deficiency anemia, unspecified: Secondary | ICD-10-CM | POA: Diagnosis not present

## 2014-01-20 MED ORDER — CYANOCOBALAMIN 1000 MCG/ML IJ SOLN
INTRAMUSCULAR | Status: AC
  Filled 2014-01-20: qty 1

## 2014-01-20 MED ORDER — SODIUM CHLORIDE 0.9 % IV SOLN
510.0000 mg | Freq: Once | INTRAVENOUS | Status: AC
  Administered 2014-01-20: 510 mg via INTRAVENOUS
  Filled 2014-01-20: qty 17

## 2014-01-20 MED ORDER — CYANOCOBALAMIN 1000 MCG/ML IJ SOLN
1000.0000 ug | Freq: Once | INTRAMUSCULAR | Status: AC
  Administered 2014-01-20: 1000 ug via INTRAMUSCULAR

## 2014-01-20 NOTE — Patient Instructions (Signed)

## 2014-01-20 NOTE — Progress Notes (Signed)
MD stated he is okay with pt only receiving 1 dose of 510mg  Feraheme today. Pt does not need to return for a second dose. MD also stated he is okay for pt to receive Vit B12 injection today (one week early).

## 2014-01-20 NOTE — Telephone Encounter (Signed)
Patient cx 01/27/14 apt

## 2014-01-23 ENCOUNTER — Telehealth: Payer: Self-pay | Admitting: *Deleted

## 2014-01-23 NOTE — Telephone Encounter (Signed)
Received records from [facility]) and forwarded to [facility] for review.

## 2014-01-25 ENCOUNTER — Ambulatory Visit: Payer: PRIVATE HEALTH INSURANCE

## 2014-01-27 ENCOUNTER — Ambulatory Visit: Payer: Medicaid Other

## 2014-02-01 ENCOUNTER — Encounter: Payer: Self-pay | Admitting: Family

## 2014-02-03 ENCOUNTER — Ambulatory Visit: Payer: Medicaid Other | Admitting: Family

## 2014-02-06 ENCOUNTER — Ambulatory Visit: Payer: Medicaid Other | Admitting: Family

## 2014-02-18 ENCOUNTER — Other Ambulatory Visit: Payer: Self-pay | Admitting: Family

## 2014-02-22 ENCOUNTER — Telehealth: Payer: Self-pay | Admitting: Hematology & Oncology

## 2014-02-22 ENCOUNTER — Ambulatory Visit: Payer: PRIVATE HEALTH INSURANCE

## 2014-02-22 ENCOUNTER — Other Ambulatory Visit: Payer: 59 | Admitting: Lab

## 2014-02-22 ENCOUNTER — Ambulatory Visit: Payer: PRIVATE HEALTH INSURANCE | Admitting: Hematology & Oncology

## 2014-02-22 NOTE — Telephone Encounter (Signed)
Left pt message to call and schedule appointment from today.

## 2014-03-10 ENCOUNTER — Other Ambulatory Visit: Payer: Self-pay | Admitting: Family

## 2014-03-10 NOTE — Telephone Encounter (Signed)
Last filled:  12/07/13 Amt:  30, 2 refills Last OV:  09/14/13 Last TSH: 09/28/13  Med filled x 3 months.

## 2014-03-12 ENCOUNTER — Other Ambulatory Visit: Payer: Self-pay | Admitting: Family

## 2014-03-13 ENCOUNTER — Other Ambulatory Visit (HOSPITAL_BASED_OUTPATIENT_CLINIC_OR_DEPARTMENT_OTHER): Payer: 59 | Admitting: Lab

## 2014-03-13 ENCOUNTER — Ambulatory Visit (HOSPITAL_BASED_OUTPATIENT_CLINIC_OR_DEPARTMENT_OTHER): Payer: 59 | Admitting: Family

## 2014-03-13 ENCOUNTER — Encounter: Payer: Self-pay | Admitting: Family

## 2014-03-13 ENCOUNTER — Ambulatory Visit (HOSPITAL_BASED_OUTPATIENT_CLINIC_OR_DEPARTMENT_OTHER): Payer: 59

## 2014-03-13 VITALS — BP 135/83 | HR 59 | Temp 97.9°F | Resp 18 | Wt 303.0 lb

## 2014-03-13 DIAGNOSIS — D509 Iron deficiency anemia, unspecified: Secondary | ICD-10-CM

## 2014-03-13 DIAGNOSIS — D510 Vitamin B12 deficiency anemia due to intrinsic factor deficiency: Secondary | ICD-10-CM

## 2014-03-13 DIAGNOSIS — D508 Other iron deficiency anemias: Secondary | ICD-10-CM

## 2014-03-13 LAB — CBC WITH DIFFERENTIAL (CANCER CENTER ONLY)
BASO#: 0 10*3/uL (ref 0.0–0.2)
BASO%: 0.2 % (ref 0.0–2.0)
EOS%: 4.8 % (ref 0.0–7.0)
Eosinophils Absolute: 0.2 10*3/uL (ref 0.0–0.5)
HCT: 39.8 % (ref 34.8–46.6)
HGB: 13.4 g/dL (ref 11.6–15.9)
LYMPH#: 1.3 10*3/uL (ref 0.9–3.3)
LYMPH%: 27.9 % (ref 14.0–48.0)
MCH: 29.1 pg (ref 26.0–34.0)
MCHC: 33.7 g/dL (ref 32.0–36.0)
MCV: 87 fL (ref 81–101)
MONO#: 0.4 10*3/uL (ref 0.1–0.9)
MONO%: 9 % (ref 0.0–13.0)
NEUT#: 2.7 10*3/uL (ref 1.5–6.5)
NEUT%: 58.1 % (ref 39.6–80.0)
Platelets: 154 10*3/uL (ref 145–400)
RBC: 4.6 10*6/uL (ref 3.70–5.32)
RDW: 13.6 % (ref 11.1–15.7)
WBC: 4.6 10*3/uL (ref 3.9–10.0)

## 2014-03-13 LAB — RETICULOCYTES (CHCC)
ABS Retic: 60.6 10*3/uL (ref 19.0–186.0)
RBC.: 4.66 MIL/uL (ref 3.87–5.11)
Retic Ct Pct: 1.3 % (ref 0.4–2.3)

## 2014-03-13 LAB — VITAMIN B12: Vitamin B-12: >2000 pg/mL — ABNORMAL HIGH (ref 211–911)

## 2014-03-13 MED ORDER — CYANOCOBALAMIN 1000 MCG/ML IJ SOLN
INTRAMUSCULAR | Status: AC
  Filled 2014-03-13: qty 1

## 2014-03-13 MED ORDER — CYANOCOBALAMIN 1000 MCG/ML IJ SOLN
1000.0000 ug | Freq: Once | INTRAMUSCULAR | Status: AC
  Administered 2014-03-13: 1000 ug via INTRAMUSCULAR

## 2014-03-13 NOTE — Telephone Encounter (Signed)
6 month follow up
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
## 2014-03-13 NOTE — Telephone Encounter (Signed)
30 day supply metoprolol sent to pharmacy. Pt last seen by PCP for acute visit in 09/2013.  When is pt due for routine follow etc. . ?

## 2014-03-13 NOTE — Patient Instructions (Signed)

## 2014-03-14 LAB — IRON AND TIBC CHCC
%SAT: 27 % (ref 21–57)
Iron: 71 ug/dL (ref 41–142)
TIBC: 259 ug/dL (ref 236–444)
UIBC: 188 ug/dL (ref 120–384)

## 2014-03-14 LAB — FERRITIN CHCC: Ferritin: 189 ng/ml (ref 9–269)

## 2014-03-18 ENCOUNTER — Other Ambulatory Visit: Payer: Self-pay | Admitting: Family

## 2014-03-29 NOTE — Telephone Encounter (Signed)
Letter mailed to pt.  

## 2014-03-29 NOTE — Telephone Encounter (Signed)
lvm advising pt of NP instructions, awaiting call back.

## 2014-04-03 ENCOUNTER — Other Ambulatory Visit: Payer: Self-pay | Admitting: Family

## 2014-04-10 ENCOUNTER — Telehealth: Payer: Self-pay | Admitting: Family

## 2014-04-10 ENCOUNTER — Ambulatory Visit (HOSPITAL_BASED_OUTPATIENT_CLINIC_OR_DEPARTMENT_OTHER): Payer: 59

## 2014-04-10 DIAGNOSIS — D510 Vitamin B12 deficiency anemia due to intrinsic factor deficiency: Secondary | ICD-10-CM | POA: Diagnosis not present

## 2014-04-10 MED ORDER — CYANOCOBALAMIN 1000 MCG/ML IJ SOLN
1000.0000 ug | Freq: Once | INTRAMUSCULAR | Status: AC
  Administered 2014-04-10: 1000 ug via INTRAMUSCULAR

## 2014-04-10 MED ORDER — CYANOCOBALAMIN 1000 MCG/ML IJ SOLN
INTRAMUSCULAR | Status: AC
Start: 2014-04-10 — End: 2014-04-10
  Filled 2014-04-10: qty 1

## 2014-04-10 NOTE — Telephone Encounter (Signed)
Pt Ramakrishnan,Kennidy DOB Aug 26, 2065 Pt needs a refill on Metoprolol 25MG .  Uses [facility] in [place] on [address] can reach her on her mobile number [phone].  Pt has appt sched for 04-19-14 @145p , but doesn't think she'll have enough to last her until her appt.Virl Cagey. [employer] Rep

## 2014-04-10 NOTE — Patient Instructions (Signed)

## 2014-04-10 NOTE — Telephone Encounter (Signed)
Rx previously filled :     Disp Refills Start End     metoprolol tartrate (LOPRESSOR) 25 MG tablet 60 tablet 0 04/03/2014     Sig: TAKE 1 TABLET BY MOUTH TWICE A DAY    Notes to Pharmacy: PATIENT NEEDS OFFICE VISIT FOR FURTHER REFILLS    E-Prescribing Status: Receipt confirmed by pharmacy (04/03/2014 7:32 PM EDT)    Patient should have enough for appt.   Called patient to notify and left message on voicemail.

## 2014-04-10 NOTE — Telephone Encounter (Deleted)
Error

## 2014-04-10 NOTE — Telephone Encounter (Signed)
Error ANW

## 2014-04-19 ENCOUNTER — Encounter: Payer: Self-pay | Admitting: Family

## 2014-04-19 ENCOUNTER — Ambulatory Visit (INDEPENDENT_AMBULATORY_CARE_PROVIDER_SITE_OTHER): Payer: Medicare Other | Admitting: Family

## 2014-04-19 VITALS — BP 132/92 | HR 64 | Temp 97.8°F | Resp 18 | Ht 67.0 in | Wt 309.0 lb

## 2014-04-19 DIAGNOSIS — I10 Essential (primary) hypertension: Secondary | ICD-10-CM | POA: Diagnosis not present

## 2014-04-19 DIAGNOSIS — E785 Hyperlipidemia, unspecified: Secondary | ICD-10-CM

## 2014-04-19 DIAGNOSIS — E039 Hypothyroidism, unspecified: Secondary | ICD-10-CM

## 2014-04-19 DIAGNOSIS — H578 Other specified disorders of eye and adnexa: Secondary | ICD-10-CM | POA: Diagnosis not present

## 2014-04-19 DIAGNOSIS — H5789 Other specified disorders of eye and adnexa: Secondary | ICD-10-CM

## 2014-04-19 MED ORDER — METOPROLOL TARTRATE 25 MG PO TABS: 25.0000 mg | ORAL_TABLET | Freq: Two times a day (BID) | ORAL | Status: DC

## 2014-04-19 MED ORDER — LEVOTHYROXINE SODIUM 175 MCG PO TABS: ORAL_TABLET | ORAL | Status: DC

## 2014-04-19 MED ORDER — LISINOPRIL 20 MG PO TABS: 20.0000 mg | ORAL_TABLET | Freq: Every day | ORAL | Status: DC

## 2014-04-19 MED ORDER — PREDNISONE 10 MG PO TABS: ORAL_TABLET | ORAL | Status: DC

## 2014-04-19 MED ORDER — FLUTICASONE-SALMETEROL 250-50 MCG/DOSE IN AEPB: 1.0000 | INHALATION_SPRAY | Freq: Two times a day (BID) | RESPIRATORY_TRACT | Status: DC

## 2014-04-19 MED ORDER — AZITHROMYCIN 250 MG PO TABS: ORAL_TABLET | ORAL | Status: DC

## 2014-04-19 MED ORDER — CIPROFLOXACIN HCL 0.3 % OP SOLN: OPHTHALMIC | Status: AC
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
## 2014-04-19 NOTE — Patient Instructions (Addendum)
Stop combivent, start advair. Add claritin 10mg  once daily for allergy symptoms. Complete lab work prior to leaving. Check blood pressure twice daily. Call me in 1 week with your readings.   Start prednisone and zpak.  Call if your asthma symptoms worsen or if not improved in 1 week. Start ciloxan drops for your left eye- call if symptoms worsen or do not improve.  Follow up in 3 months.

## 2014-04-20 ENCOUNTER — Telehealth: Payer: Self-pay | Admitting: Family

## 2014-04-20 DIAGNOSIS — E039 Hypothyroidism, unspecified: Secondary | ICD-10-CM

## 2014-04-20 LAB — BASIC METABOLIC PANEL
BUN: 8 mg/dL (ref 6–23)
CO2: 33 mEq/L — ABNORMAL HIGH (ref 19–32)
Calcium: 8.9 mg/dL (ref 8.4–10.5)
Chloride: 101 mEq/L (ref 96–112)
Creatinine, Ser: 0.66 mg/dL (ref 0.40–1.20)
GFR: 100.32 mL/min (ref >60.00)
Glucose, Bld: 89 mg/dL (ref 70–99)
Potassium: 4.4 mEq/L (ref 3.5–5.1)
Sodium: 137 mEq/L (ref 135–145)

## 2014-04-20 LAB — TSH: TSH: 4.69 u[IU]/mL — ABNORMAL HIGH (ref 0.35–4.50)

## 2014-04-20 LAB — LIPID PANEL
Cholesterol: 140 mg/dL (ref 0–200)
HDL: 45.9 mg/dL (ref >39.00)
LDL Cholesterol: 78 mg/dL (ref 0–99)
NonHDL: 94.1
Total CHOL/HDL Ratio: 3
Triglycerides: 79 mg/dL (ref 0.0–149.0)
VLDL: 15.8 mg/dL (ref 0.0–40.0)

## 2014-04-20 MED ORDER — LEVOTHYROXINE SODIUM 200 MCG PO TABS: 200.0000 ug | ORAL_TABLET | Freq: Every day | ORAL | Status: DC

## 2014-04-20 NOTE — Telephone Encounter (Signed)
Please let pt know lab work shows that synthroid should be increased. Will increase to 25mcg, repeat tsh in 6 weeks dx hypothyroid.

## 2014-04-21 NOTE — Telephone Encounter (Signed)
Notified pt and scheduled lab appt for 06/02/14 at 1:15. Future order entered.

## 2014-04-23 NOTE — Assessment & Plan Note (Signed)
Lab Results  Component Value Date   CHOL 140 04/19/2014   HDL 45.90 04/19/2014   LDLCALC 78 04/19/2014   TRIG 79.0 04/19/2014   CHOLHDL 3 04/19/2014   Follow up lipid panel is at goal.

## 2014-04-23 NOTE — Assessment & Plan Note (Signed)
Lab Results  Component Value Date   TSH 4.69* 04/19/2014   Follow up TSH is above goal, will increase synthroid, repeat tsh in 6 weeks.

## 2014-05-08 ENCOUNTER — Ambulatory Visit (HOSPITAL_BASED_OUTPATIENT_CLINIC_OR_DEPARTMENT_OTHER): Payer: Medicare Other

## 2014-05-08 VITALS — BP 127/76 | HR 63 | Temp 98.6°F | Resp 18 | Wt 310.0 lb

## 2014-05-08 DIAGNOSIS — D510 Vitamin B12 deficiency anemia due to intrinsic factor deficiency: Secondary | ICD-10-CM

## 2014-05-08 MED ORDER — CYANOCOBALAMIN 1000 MCG/ML IJ SOLN
1000.0000 ug | Freq: Once | INTRAMUSCULAR | Status: AC
  Administered 2014-05-08: 1000 ug via INTRAMUSCULAR

## 2014-05-08 MED ORDER — CYANOCOBALAMIN 1000 MCG/ML IJ SOLN
INTRAMUSCULAR | Status: AC
  Filled 2014-05-08: qty 1

## 2014-05-08 NOTE — Patient Instructions (Signed)

## 2014-06-02 ENCOUNTER — Other Ambulatory Visit (INDEPENDENT_AMBULATORY_CARE_PROVIDER_SITE_OTHER): Payer: Medicare Other

## 2014-06-02 ENCOUNTER — Encounter: Payer: Self-pay | Admitting: Family

## 2014-06-02 DIAGNOSIS — E039 Hypothyroidism, unspecified: Secondary | ICD-10-CM | POA: Diagnosis not present

## 2014-06-02 LAB — TSH: TSH: 2.83 u[IU]/mL (ref 0.35–4.50)

## 2014-06-05 ENCOUNTER — Ambulatory Visit: Payer: 59

## 2014-06-05 ENCOUNTER — Ambulatory Visit: Payer: 59 | Admitting: Hematology & Oncology

## 2014-06-05 ENCOUNTER — Other Ambulatory Visit: Payer: 59

## 2014-07-03 ENCOUNTER — Other Ambulatory Visit (HOSPITAL_BASED_OUTPATIENT_CLINIC_OR_DEPARTMENT_OTHER): Payer: Medicare Other

## 2014-07-03 ENCOUNTER — Ambulatory Visit (HOSPITAL_BASED_OUTPATIENT_CLINIC_OR_DEPARTMENT_OTHER): Payer: Medicare Other

## 2014-07-03 ENCOUNTER — Ambulatory Visit (HOSPITAL_BASED_OUTPATIENT_CLINIC_OR_DEPARTMENT_OTHER): Payer: Medicare Other | Admitting: Family

## 2014-07-03 VITALS — BP 140/87 | HR 65 | Temp 98.2°F | Resp 20 | Wt 315.0 lb

## 2014-07-03 DIAGNOSIS — D510 Vitamin B12 deficiency anemia due to intrinsic factor deficiency: Secondary | ICD-10-CM | POA: Diagnosis not present

## 2014-07-03 DIAGNOSIS — D509 Iron deficiency anemia, unspecified: Secondary | ICD-10-CM | POA: Diagnosis not present

## 2014-07-03 DIAGNOSIS — D508 Other iron deficiency anemias: Secondary | ICD-10-CM

## 2014-07-03 LAB — CBC WITH DIFFERENTIAL (CANCER CENTER ONLY)
BASO#: 0 10*3/uL (ref 0.0–0.2)
BASO%: 0.2 % (ref 0.0–2.0)
EOS%: 5.4 % (ref 0.0–7.0)
Eosinophils Absolute: 0.3 10*3/uL (ref 0.0–0.5)
HCT: 40.2 % (ref 34.8–46.6)
HGB: 13.7 g/dL (ref 11.6–15.9)
LYMPH#: 1.3 10*3/uL (ref 0.9–3.3)
LYMPH%: 26.2 % (ref 14.0–48.0)
MCH: 29 pg (ref 26.0–34.0)
MCHC: 34.1 g/dL (ref 32.0–36.0)
MCV: 85 fL (ref 81–101)
MONO#: 0.4 10*3/uL (ref 0.1–0.9)
MONO%: 9 % (ref 0.0–13.0)
NEUT#: 2.8 10*3/uL (ref 1.5–6.5)
NEUT%: 59.2 % (ref 39.6–80.0)
Platelets: 136 10*3/uL — ABNORMAL LOW (ref 145–400)
RBC: 4.72 10*6/uL (ref 3.70–5.32)
RDW: 13.4 % (ref 11.1–15.7)
WBC: 4.8 10*3/uL (ref 3.9–10.0)

## 2014-07-03 LAB — VITAMIN B12: Vitamin B-12: 449 pg/mL (ref 211–911)

## 2014-07-03 MED ORDER — CYANOCOBALAMIN 1000 MCG/ML IJ SOLN
INTRAMUSCULAR | Status: AC
  Filled 2014-07-03: qty 1

## 2014-07-03 MED ORDER — CYANOCOBALAMIN 1000 MCG/ML IJ SOLN
1000.0000 ug | Freq: Once | INTRAMUSCULAR | Status: AC
  Administered 2014-07-03: 1000 ug via INTRAMUSCULAR

## 2014-07-03 NOTE — Patient Instructions (Signed)

## 2014-07-03 NOTE — Progress Notes (Signed)
Hematology and Oncology Follow Up Visit  Megan [name] [facility] [id number] 22-Mar-1965 49 y.o. 07/03/2014   Principle Diagnosis:  1. Recurrent iron-deficiency anemia. 2. pernicious anemia  Current Therapy:   IV iron as indicated Vitamin B12 1 mg IM every month    Interim History: Megan [name] is is here today for follow-up. She just finished Megan semester with a 4.o and is very happy. She is still having neck and back pain but would like to avoid surgery if possible. She plans to check with Megan insurance to see if they will cover a trainer or some type of PT.   She is experiencing some fatigue and has not had a B 12 injection since February. We will schedule Megan for injections monthly and give Megan a printed calendar of appointments.  She has had no problem with infections. No fever, chills, n/v, cough, rash, dizziness, SOB, chest pain, palpitations, abdominal pain, diarrhea, blood in urine or stool. She has gastroparesis which causes constipation and is on antibiotics. This does seem to help.  She has some slight swelling in the right knee and is going to get a steroid injection soon. She has some tingling in Megan fingertips that she states is due to Megan neck issues. This is not a new issue. Megan appetite is good and she is staying hydrated. Megan weight is stable. She is really wanting to lose some weight through exercise but [place] struggling to find something that will not strain Megan back.  In February, Megan ferritin was 189 with iron saturation 27%. Megan last dose of Feraheme was in January of this year.  She does have what appears to be a stye in Megan left eye. She plans to follow-up with Megan [name] this week. Megan [name] has now retired. 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   Medications:    Medication List       This list is accurate as of: 07/03/14  3:08 PM.  Always use your most recent med list.               aspirin 81 MG tablet    Take 81 mg by mouth daily.     cyanocobalamin 1000 MCG/ML injection  Commonly known as:  (VITAMIN B-12)  Inject 1,000 mcg into the muscle every 30 (thirty) days.     erythromycin 250 MG tablet  Commonly known as:  E-MYCIN  Take 250 mg by mouth 3 (three) times daily. TAKES 1/2 TAB  TID     Fluticasone-Salmeterol 250-50 MCG/DOSE Aepb  Commonly known as:  ADVAIR DISKUS  Inhale 1 puff into the lungs 2 (two) times daily.     Ipratropium-Albuterol 20-100 MCG/ACT Aers respimat  Commonly known as:  COMBIVENT  Inhale 1 puff into the lungs every 6 (six) hours as needed for wheezing.     levothyroxine 200 MCG tablet  Commonly known as:  SYNTHROID, LEVOTHROID  Take 1 tablet (200 mcg total) by mouth daily before breakfast.     lisinopril 20 MG tablet  Commonly known as:  PRINIVIL,ZESTRIL  Take 1 tablet (20 mg total) by mouth daily.     metoprolol tartrate 25 MG tablet  Commonly known as:  LOPRESSOR  Take 1 tablet (25 mg total) by mouth 2 (two) times daily.     Oxycodone HCl 20 MG Tabs  Take 20 mg by mouth 4 (four) times daily.     PROAIR HFA 108 (90 BASE) MCG/ACT  inhaler  Generic drug:  albuterol  INHALE 2 PUFFS INTO THE LUNGS EVERY 6 (SIX) HOURS AS NEEDED. FOR WHEEZING     ZOFRAN ODT 8 MG disintegrating tablet  Generic drug:  ondansetron  Take 8 mg by mouth as needed for nausea or vomiting.        Allergies:  Allergies  Allergen Reactions  . Hydrocodone Anaphylaxis  . Penicillins Rash    Throat swells up as well  . Gadolinium Derivatives     Reactions have happened twice, after leaving facility. Pt reports feeling hot in trunk but extremities were icy cold; shivering, vomiting the first time after receiving contrast.  Symptoms lasted 12-18 hours.    . Chantix [Varenicline] Nausea Only  . Cymbalta [Duloxetine Hcl]     headach  . Morphine Other (See Comments)    REACTION: irregular heart beat  . Codeine Rash    Past Medical History, Surgical history, Social history,  and Family History were reviewed and updated.  Review of Systems: All other 10 point review of systems is negative.   Physical Exam:  weight is 315 lb (142.883 kg). Megan oral temperature is 98.2 F (36.8 C). Megan blood pressure is 140/87 and Megan pulse is 65. Megan respiration is 20.   Wt Readings from Last 3 Encounters:  07/03/14 315 lb (142.883 kg)  05/08/14 310 lb (140.615 kg)  04/19/14 309 lb (140.161 kg)    Ocular: Sclerae unicteric, pupils equal, round and reactive to light Ear-nose-throat: Oropharynx clear, dentition fair Lymphatic: No cervical or supraclavicular adenopathy Lungs no rales or rhonchi, good excursion bilaterally Heart regular rate and rhythm, no murmur appreciated Abd soft, nontender, positive bowel sounds MSK no focal spinal tenderness, no joint edema Neuro: non-focal, well-oriented, appropriate affect Breasts: Deferred  Lab Results  Component Value Date   WBC 4.8 07/03/2014   HGB 13.7 07/03/2014   HCT 40.2 07/03/2014   MCV 85 07/03/2014   PLT 136* 07/03/2014   Lab Results  Component Value Date   FERRITIN 189 03/13/2014   IRON 71 03/13/2014   TIBC 259 03/13/2014   UIBC 188 03/13/2014   IRONPCTSAT 27 03/13/2014   Lab Results  Component Value Date   RETICCTPCT 1.3 03/13/2014   RBC 4.72 07/03/2014   RETICCTABS 60.6 03/13/2014   No results found for: KPAFRELGTCHN, LAMBDASER, KAPLAMBRATIO No results found for: IGGSERUM, IGA, IGMSERUM No results found for: Odetta Pink, SPEI   Chemistry      Component Value Date/Time   NA 137 04/19/2014 1453   K 4.4 04/19/2014 1453   CL 101 04/19/2014 1453   CO2 33* 04/19/2014 1453   BUN 8 04/19/2014 1453   CREATININE 0.66 04/19/2014 1453   CREATININE 0.61 08/17/2013 1420      Component Value Date/Time   CALCIUM 8.9 04/19/2014 1453   ALKPHOS 93 03/22/2013 1627   AST 28 03/22/2013 1627   ALT 23 03/22/2013 1627   BILITOT 0.5 03/22/2013 1627      Impression and Plan: Megan [name] is a 49 year old white female with multifactorial anemia (iron deficiency and pernicious anemia). She is symptomatic with fatigue.   We will give Megan a B 12 injection today and schedule Megan to receive one monthly.   Megan Cbc today was good. We will see what Megan iron studies show. We will bring Megan in later this week for Feraheme if needed.  We will see Megan back in 4 months for labs and follow-up.  She knows to  call here with any questions or concerns. We can certainly see Megan sooner if need be.   Eliezer Bottom, NP 6/20/20163:08 PM

## 2014-07-04 LAB — IRON AND TIBC CHCC
%SAT: 23 % (ref 21–57)
Iron: 70 ug/dL (ref 41–142)
TIBC: 296 ug/dL (ref 236–444)
UIBC: 227 ug/dL (ref 120–384)

## 2014-07-04 LAB — FERRITIN CHCC: Ferritin: 162 ng/ml (ref 9–269)

## 2014-07-19 ENCOUNTER — Ambulatory Visit (INDEPENDENT_AMBULATORY_CARE_PROVIDER_SITE_OTHER): Payer: Medicare Other | Admitting: Family

## 2014-07-19 ENCOUNTER — Encounter: Payer: Self-pay | Admitting: Family

## 2014-07-19 ENCOUNTER — Ambulatory Visit (HOSPITAL_BASED_OUTPATIENT_CLINIC_OR_DEPARTMENT_OTHER): Payer: Medicare Other

## 2014-07-19 VITALS — BP 134/86 | HR 54 | Temp 97.4°F

## 2014-07-19 DIAGNOSIS — J45901 Unspecified asthma with (acute) exacerbation: Secondary | ICD-10-CM | POA: Insufficient documentation

## 2014-07-19 DIAGNOSIS — J4520 Mild intermittent asthma, uncomplicated: Secondary | ICD-10-CM

## 2014-07-19 DIAGNOSIS — Z23 Encounter for immunization: Secondary | ICD-10-CM | POA: Diagnosis not present

## 2014-07-19 DIAGNOSIS — D510 Vitamin B12 deficiency anemia due to intrinsic factor deficiency: Secondary | ICD-10-CM

## 2014-07-19 DIAGNOSIS — E039 Hypothyroidism, unspecified: Secondary | ICD-10-CM | POA: Diagnosis not present

## 2014-07-19 DIAGNOSIS — I10 Essential (primary) hypertension: Secondary | ICD-10-CM | POA: Diagnosis not present

## 2014-07-19 HISTORY — DX: Unspecified asthma with (acute) exacerbation: J45.901

## 2014-07-19 MED ORDER — CYANOCOBALAMIN 1000 MCG/ML IJ SOLN
INTRAMUSCULAR | Status: AC
  Filled 2014-07-19: qty 1

## 2014-07-19 MED ORDER — CYANOCOBALAMIN 1000 MCG/ML IJ SOLN
1000.0000 ug | Freq: Once | INTRAMUSCULAR | Status: AC
  Administered 2014-07-19: 1000 ug via INTRAMUSCULAR

## 2014-07-19 NOTE — Progress Notes (Signed)
Pre visit review using our clinic review tool, if applicable. No additional management support is needed unless otherwise documented below in the visit note. 

## 2014-07-19 NOTE — Addendum Note (Signed)
Addended by: Kelle Darting A on: 07/19/2014 04:01 PM   Modules accepted: Orders

## 2014-07-19 NOTE — Assessment & Plan Note (Signed)
Last TSH WNL, continue current dose of synthroid.
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
## 2014-07-19 NOTE — Patient Instructions (Signed)
Please complete lab work prior to leaving.   Please schedule a follow up appointment in 3 months.  

## 2014-07-19 NOTE — Assessment & Plan Note (Signed)
Stable, monitor.  °

## 2014-07-19 NOTE — Assessment & Plan Note (Addendum)
bp stable on current meds. Continue same. Obtain bmet.

## 2014-07-19 NOTE — Patient Instructions (Signed)
Cyanocobalamin, Pyridoxine, and Folate What is this medicine? A multivitamin containing folic acid, vitamin B6, and vitamin B12. This medicine may be used for other purposes; ask your health care provider or pharmacist if you have questions. COMMON BRAND NAME(S): AllanFol RX, AllanTex, ComBgen, FaBB, Folamin, Folastin, [place], [place], [place], Oral, [place], Folgard RX, [place] RX 2.2, [place], [place] 2.2, Foltabs 800, Foltx, Homocysteine Formula, NuFol, TL [employer], Virt-Vite, Virt-Vite [place], Vita-Respa What should I tell my health care provider before I take this medicine? They need to know if you have any of these conditions: -bleeding or clotting disorder -history of anemia of any type -other chronic health condition -an unusual or allergic reaction to vitamins, other medicines, foods, dyes, or preservatives -pregnant or trying to get pregnant -breast-feeding How should I use this medicine? Take by mouth with a glass of water. May take with food. Follow the directions on the prescription label. It is usually given once a day. Do not take your medicine more often than directed. Contact your pediatrician regarding the use of this medicine in children. Special care may be needed. Overdosage: If you think you have taken too much of this medicine contact a poison control center or emergency room at once. NOTE: This medicine is only for you. Do not share this medicine with others. What if I miss a dose? If you miss a dose, take it as soon as you can. If it is almost time for your next dose, take only that dose. Do not take double or extra doses. What may interact with this medicine? -levodopa This list may not describe all possible interactions. Give your health care provider a list of all the medicines, herbs, non-prescription drugs, or dietary supplements you use. Also tell them if you smoke, drink alcohol, or use illegal drugs. Some items may interact with your medicine. What should I  watch for while using this medicine? See your health care professional for regular checks on your progress. Remember that vitamin supplements do not replace the need for good nutrition from a balanced diet. What side effects may I notice from receiving this medicine? Side effects that you should report to your doctor or health care professional as soon as possible: -allergic reaction such as skin rash or difficulty breathing -vomiting Side effects that usually do not require medical attention (report to your doctor or health care professional if they continue or are bothersome): -nausea -stomach upset This list may not describe all possible side effects. Call your doctor for medical advice about side effects. You may report side effects to FDA at 1-800-FDA-1088. Where should I keep my medicine? Keep out of the reach of children. Most vitamins should be stored at controlled room temperature. Check your specific product directions. Protect from heat and moisture. Throw away any unused medicine after the expiration date. NOTE: This sheet is a summary. It may not cover all possible information. If you have questions about this medicine, talk to your doctor, pharmacist, or health care provider.  2015, Elsevier/Gold Standard. (2007-02-20 00:59:55)

## 2014-07-20 ENCOUNTER — Ambulatory Visit: Payer: Medicare Other

## 2014-07-20 ENCOUNTER — Encounter: Payer: Self-pay | Admitting: Family

## 2014-07-20 LAB — BASIC METABOLIC PANEL
BUN: 11 mg/dL (ref 6–23)
CO2: 33 mEq/L — ABNORMAL HIGH (ref 19–32)
Calcium: 9.1 mg/dL (ref 8.4–10.5)
Chloride: 100 mEq/L (ref 96–112)
Creatinine, Ser: 0.65 mg/dL (ref 0.40–1.20)
GFR: 102.01 mL/min (ref >60.00)
Glucose, Bld: 79 mg/dL (ref 70–99)
Potassium: 4.1 mEq/L (ref 3.5–5.1)
Sodium: 137 mEq/L (ref 135–145)
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
## 2014-07-25 ENCOUNTER — Encounter (HOSPITAL_COMMUNITY)
Admission: RE | Admit: 2014-07-25 | Discharge: 2014-07-25 | Disposition: A | Discharge status: Home / Self Care | Payer: Medicare Other | Source: Ambulatory Visit | Attending: Obstetrics and Gynecology | Admitting: Obstetrics and Gynecology

## 2014-07-25 ENCOUNTER — Other Ambulatory Visit: Payer: Self-pay

## 2014-07-25 ENCOUNTER — Encounter (HOSPITAL_COMMUNITY): Payer: Self-pay

## 2014-07-25 DIAGNOSIS — K754 Autoimmune hepatitis: Secondary | ICD-10-CM | POA: Diagnosis not present

## 2014-07-25 DIAGNOSIS — M1990 Unspecified osteoarthritis, unspecified site: Secondary | ICD-10-CM | POA: Diagnosis not present

## 2014-07-25 DIAGNOSIS — M797 Fibromyalgia: Secondary | ICD-10-CM | POA: Diagnosis not present

## 2014-07-25 DIAGNOSIS — J45909 Unspecified asthma, uncomplicated: Secondary | ICD-10-CM | POA: Diagnosis not present

## 2014-07-25 DIAGNOSIS — E039 Hypothyroidism, unspecified: Secondary | ICD-10-CM | POA: Diagnosis not present

## 2014-07-25 DIAGNOSIS — J449 Chronic obstructive pulmonary disease, unspecified: Secondary | ICD-10-CM | POA: Diagnosis not present

## 2014-07-25 DIAGNOSIS — F17210 Nicotine dependence, cigarettes, uncomplicated: Secondary | ICD-10-CM | POA: Diagnosis not present

## 2014-07-25 DIAGNOSIS — K760 Fatty (change of) liver, not elsewhere classified: Secondary | ICD-10-CM | POA: Diagnosis not present

## 2014-07-25 DIAGNOSIS — Z885 Allergy status to narcotic agent status: Secondary | ICD-10-CM | POA: Diagnosis not present

## 2014-07-25 DIAGNOSIS — D259 Leiomyoma of uterus, unspecified: Secondary | ICD-10-CM | POA: Diagnosis not present

## 2014-07-25 DIAGNOSIS — Z888 Allergy status to other drugs, medicaments and biological substances status: Secondary | ICD-10-CM | POA: Diagnosis not present

## 2014-07-25 DIAGNOSIS — K3184 Gastroparesis: Secondary | ICD-10-CM | POA: Diagnosis not present

## 2014-07-25 DIAGNOSIS — E1143 Type 2 diabetes mellitus with diabetic autonomic (poly)neuropathy: Secondary | ICD-10-CM | POA: Diagnosis not present

## 2014-07-25 DIAGNOSIS — Z79899 Other long term (current) drug therapy: Secondary | ICD-10-CM | POA: Diagnosis not present

## 2014-07-25 DIAGNOSIS — G8929 Other chronic pain: Secondary | ICD-10-CM | POA: Diagnosis not present

## 2014-07-25 DIAGNOSIS — N840 Polyp of corpus uteri: Secondary | ICD-10-CM | POA: Diagnosis not present

## 2014-07-25 DIAGNOSIS — Z880 Allergy status to penicillin: Secondary | ICD-10-CM | POA: Diagnosis not present

## 2014-07-25 DIAGNOSIS — Z8619 Personal history of other infectious and parasitic diseases: Secondary | ICD-10-CM | POA: Diagnosis not present

## 2014-07-25 DIAGNOSIS — E785 Hyperlipidemia, unspecified: Secondary | ICD-10-CM | POA: Diagnosis not present

## 2014-07-25 DIAGNOSIS — I10 Essential (primary) hypertension: Secondary | ICD-10-CM | POA: Diagnosis not present

## 2014-07-25 DIAGNOSIS — M549 Dorsalgia, unspecified: Secondary | ICD-10-CM | POA: Diagnosis not present

## 2014-07-25 NOTE — Patient Instructions (Addendum)
   Your procedure is scheduled on:  July 14 at [facility] through the [facility] at: [place] up the phone at the desk and dial [phone] and inform us of your arrival.  Please call this number if you have any problems the morning of surgery: [phone]  Remember: Do not eat food after midnight: July 13 Do not drink clear liquids after: July 13  Take these medicines the morning of surgery with a SIP OF WATER: Take all am meds day of surgery   Do not wear jewelry, make-up, or FINGER nail polish No metal in your hair or on your body. Do not wear lotions, powders, perfumes.  You may wear deodorant.  Do not bring valuables to the hospital. Contacts, dentures or bridgework may not be worn into surgery.     Patients discharged on the day of surgery will not be allowed to drive home.

## 2014-07-26 ENCOUNTER — Encounter (HOSPITAL_COMMUNITY): Payer: Self-pay | Admitting: Anesthesiology

## 2014-07-26 NOTE — Anesthesia Preprocedure Evaluation (Addendum)
Anesthesia Evaluation  Patient identified by MRN, date of birth, ID band Patient awake    Reviewed: Allergy & Precautions, NPO status , Patient's Chart, lab work & pertinent test results  Airway Mallampati: III  TM Distance: >3 FB Neck ROM: Full    Dental  (+) Lower Dentures, Partial Upper   Pulmonary asthma , COPDCurrent Smoker,  breath sounds clear to auscultation  Pulmonary exam normal       Cardiovascular Exercise Tolerance: Poor hypertension, Pt. on medications + dysrhythmias Rhythm:Regular Rate:Normal     Neuro/Psych Hx/o retinoblastoma age 49  Neuromuscular disease negative psych ROS   GI/Hepatic (+) Hepatitis -, AutoimmuneIn remission for last 2-3 years on no Rx Gastroparesis   Endo/Other  diabetes, Well ControlledHypothyroidism Morbid obesityHyperlipidemia  Renal/GU   negative genitourinary   Musculoskeletal  (+) Arthritis -, Osteoarthritis,  Fibromyalgia -  Abdominal (+) + obese,   Peds  Hematology  (+) anemia , Hx/o PA   Anesthesia Other Findings   Reproductive/Obstetrics Endometrial polyp                           Anesthesia Physical Anesthesia Plan  ASA: III  Anesthesia Plan: General   Post-op Pain Management:    Induction: Intravenous, Rapid sequence and Cricoid pressure planned  Airway Management Planned: Oral ETT  Additional Equipment:   Intra-op Plan:   Post-operative Plan: Extubation in OR  Informed Consent: I have reviewed the patients History and Physical, chart, labs and discussed the procedure including the risks, benefits and alternatives for the proposed anesthesia with the patient or authorized representative who has indicated his/her understanding and acceptance.   Dental advisory given  Plan Discussed with: Anesthesiologist, CRNA and Surgeon  Anesthesia Plan Comments:        Anesthesia Quick Evaluation

## 2014-07-27 ENCOUNTER — Ambulatory Visit (HOSPITAL_COMMUNITY): Payer: Medicare Other | Admitting: Anesthesiology

## 2014-07-27 ENCOUNTER — Encounter (HOSPITAL_COMMUNITY)
Admission: RE | Disposition: A | Discharge status: Home / Self Care | Payer: Self-pay | Source: Ambulatory Visit | Attending: Obstetrics and Gynecology

## 2014-07-27 ENCOUNTER — Encounter (HOSPITAL_COMMUNITY): Payer: Self-pay | Admitting: Anesthesiology

## 2014-07-27 ENCOUNTER — Ambulatory Visit (HOSPITAL_COMMUNITY)
Admission: RE | Admit: 2014-07-27 | Discharge: 2014-07-27 | Disposition: A | Discharge status: Home / Self Care | Payer: Medicare Other | Source: Ambulatory Visit | Attending: Obstetrics and Gynecology | Admitting: Obstetrics and Gynecology

## 2014-07-27 DIAGNOSIS — Z880 Allergy status to penicillin: Secondary | ICD-10-CM | POA: Insufficient documentation

## 2014-07-27 DIAGNOSIS — E039 Hypothyroidism, unspecified: Secondary | ICD-10-CM | POA: Insufficient documentation

## 2014-07-27 DIAGNOSIS — M549 Dorsalgia, unspecified: Secondary | ICD-10-CM | POA: Insufficient documentation

## 2014-07-27 DIAGNOSIS — I10 Essential (primary) hypertension: Secondary | ICD-10-CM | POA: Diagnosis not present

## 2014-07-27 DIAGNOSIS — J449 Chronic obstructive pulmonary disease, unspecified: Secondary | ICD-10-CM | POA: Insufficient documentation

## 2014-07-27 DIAGNOSIS — K754 Autoimmune hepatitis: Secondary | ICD-10-CM | POA: Insufficient documentation

## 2014-07-27 DIAGNOSIS — E1143 Type 2 diabetes mellitus with diabetic autonomic (poly)neuropathy: Secondary | ICD-10-CM | POA: Insufficient documentation

## 2014-07-27 DIAGNOSIS — E785 Hyperlipidemia, unspecified: Secondary | ICD-10-CM | POA: Insufficient documentation

## 2014-07-27 DIAGNOSIS — F17210 Nicotine dependence, cigarettes, uncomplicated: Secondary | ICD-10-CM | POA: Insufficient documentation

## 2014-07-27 DIAGNOSIS — M797 Fibromyalgia: Secondary | ICD-10-CM | POA: Insufficient documentation

## 2014-07-27 DIAGNOSIS — N840 Polyp of corpus uteri: Secondary | ICD-10-CM | POA: Diagnosis not present

## 2014-07-27 DIAGNOSIS — K760 Fatty (change of) liver, not elsewhere classified: Secondary | ICD-10-CM | POA: Insufficient documentation

## 2014-07-27 DIAGNOSIS — Z888 Allergy status to other drugs, medicaments and biological substances status: Secondary | ICD-10-CM | POA: Insufficient documentation

## 2014-07-27 DIAGNOSIS — D259 Leiomyoma of uterus, unspecified: Secondary | ICD-10-CM | POA: Insufficient documentation

## 2014-07-27 DIAGNOSIS — K3184 Gastroparesis: Secondary | ICD-10-CM | POA: Insufficient documentation

## 2014-07-27 DIAGNOSIS — Z885 Allergy status to narcotic agent status: Secondary | ICD-10-CM | POA: Insufficient documentation

## 2014-07-27 DIAGNOSIS — J45909 Unspecified asthma, uncomplicated: Secondary | ICD-10-CM | POA: Insufficient documentation

## 2014-07-27 DIAGNOSIS — Z8619 Personal history of other infectious and parasitic diseases: Secondary | ICD-10-CM | POA: Insufficient documentation

## 2014-07-27 DIAGNOSIS — M1990 Unspecified osteoarthritis, unspecified site: Secondary | ICD-10-CM | POA: Insufficient documentation

## 2014-07-27 DIAGNOSIS — Z79899 Other long term (current) drug therapy: Secondary | ICD-10-CM | POA: Insufficient documentation

## 2014-07-27 DIAGNOSIS — G8929 Other chronic pain: Secondary | ICD-10-CM | POA: Insufficient documentation

## 2014-07-27 HISTORY — PX: DILATATION & CURETTAGE/HYSTEROSCOPY WITH MYOSURE: SHX6511

## 2014-07-27 HISTORY — PX: HYSTEROSCOPY: SHX211

## 2014-07-27 LAB — GLUCOSE, CAPILLARY: Glucose-Capillary: 69 mg/dL (ref 65–99)

## 2014-07-27 SURGERY — DILATATION & CURETTAGE/HYSTEROSCOPY WITH MYOSURE
Anesthesia: General

## 2014-07-27 MED ORDER — DEXAMETHASONE SODIUM PHOSPHATE 10 MG/ML IJ SOLN
INTRAMUSCULAR | Status: DC | PRN
  Administered 2014-07-27: 4 mg via INTRAVENOUS

## 2014-07-27 MED ORDER — LACTATED RINGERS IV SOLN
INTRAVENOUS | Status: DC
  Administered 2014-07-27 (×2): via INTRAVENOUS

## 2014-07-27 MED ORDER — ONDANSETRON HCL 4 MG/2ML IJ SOLN
INTRAMUSCULAR | Status: AC
  Filled 2014-07-27: qty 2

## 2014-07-27 MED ORDER — METOCLOPRAMIDE HCL 5 MG/ML IJ SOLN: 10.0000 mg | Freq: Once | INTRAMUSCULAR | Status: DC | PRN

## 2014-07-27 MED ORDER — DEXAMETHASONE SODIUM PHOSPHATE 4 MG/ML IJ SOLN
INTRAMUSCULAR | Status: AC
  Filled 2014-07-27: qty 1

## 2014-07-27 MED ORDER — FENTANYL CITRATE (PF) 100 MCG/2ML IJ SOLN
INTRAMUSCULAR | Status: DC | PRN
  Administered 2014-07-27 (×3): 50 ug via INTRAVENOUS

## 2014-07-27 MED ORDER — KETOROLAC TROMETHAMINE 30 MG/ML IJ SOLN
INTRAMUSCULAR | Status: AC
  Filled 2014-07-27: qty 1

## 2014-07-27 MED ORDER — LACTATED RINGERS IV SOLN: INTRAVENOUS | Status: DC

## 2014-07-27 MED ORDER — ONDANSETRON HCL 4 MG/2ML IJ SOLN
INTRAMUSCULAR | Status: DC | PRN
Start: 2014-07-27 — End: 2014-07-27
  Administered 2014-07-27: 4 mg via INTRAVENOUS

## 2014-07-27 MED ORDER — PROPOFOL 10 MG/ML IV BOLUS
INTRAVENOUS | Status: AC
  Filled 2014-07-27: qty 40

## 2014-07-27 MED ORDER — SODIUM CHLORIDE 0.9 % IR SOLN
Status: DC | PRN
  Administered 2014-07-27 (×2): 3000 mL

## 2014-07-27 MED ORDER — LIDOCAINE HCL 1 % IJ SOLN
INTRAMUSCULAR | Status: AC
  Filled 2014-07-27: qty 20

## 2014-07-27 MED ORDER — MIDAZOLAM HCL 2 MG/2ML IJ SOLN
INTRAMUSCULAR | Status: AC
  Filled 2014-07-27: qty 2

## 2014-07-27 MED ORDER — SCOPOLAMINE 1 MG/3DAYS TD PT72
MEDICATED_PATCH | TRANSDERMAL | Status: AC
  Filled 2014-07-27: qty 1

## 2014-07-27 MED ORDER — LIDOCAINE HCL (CARDIAC) 20 MG/ML IV SOLN
INTRAVENOUS | Status: DC | PRN
  Administered 2014-07-27: 100 mg via INTRAVENOUS

## 2014-07-27 MED ORDER — EPHEDRINE 5 MG/ML INJ
INTRAVENOUS | Status: AC
  Filled 2014-07-27: qty 10

## 2014-07-27 MED ORDER — MIDAZOLAM HCL 2 MG/2ML IJ SOLN
INTRAMUSCULAR | Status: DC | PRN
  Administered 2014-07-27 (×2): 1 mg via INTRAVENOUS

## 2014-07-27 MED ORDER — PROPOFOL 10 MG/ML IV BOLUS
INTRAVENOUS | Status: DC | PRN
  Administered 2014-07-27: 250 mg via INTRAVENOUS

## 2014-07-27 MED ORDER — FENTANYL CITRATE (PF) 100 MCG/2ML IJ SOLN
25.0000 ug | INTRAMUSCULAR | Status: DC | PRN
Start: 2014-07-27 — End: 2014-07-27

## 2014-07-27 MED ORDER — PHENYLEPHRINE 40 MCG/ML (10ML) SYRINGE FOR IV PUSH (FOR BLOOD PRESSURE SUPPORT)
PREFILLED_SYRINGE | INTRAVENOUS | Status: AC
  Filled 2014-07-27: qty 10

## 2014-07-27 MED ORDER — SCOPOLAMINE 1 MG/3DAYS TD PT72: 1.0000 | MEDICATED_PATCH | Freq: Once | TRANSDERMAL | Status: DC

## 2014-07-27 MED ORDER — FENTANYL CITRATE (PF) 250 MCG/5ML IJ SOLN
INTRAMUSCULAR | Status: AC
  Filled 2014-07-27: qty 5

## 2014-07-27 MED ORDER — LIDOCAINE HCL (CARDIAC) 20 MG/ML IV SOLN
INTRAVENOUS | Status: AC
  Filled 2014-07-27: qty 5

## 2014-07-27 MED ORDER — EPHEDRINE SULFATE 50 MG/ML IJ SOLN
INTRAMUSCULAR | Status: DC | PRN
  Administered 2014-07-27 (×3): 5 mg via INTRAVENOUS

## 2014-07-27 MED ORDER — PHENYLEPHRINE HCL 10 MG/ML IJ SOLN
INTRAMUSCULAR | Status: DC | PRN
  Administered 2014-07-27: 40 ug via INTRAVENOUS

## 2014-07-27 MED ORDER — LIDOCAINE HCL 1 % IJ SOLN
INTRAMUSCULAR | Status: DC | PRN
Start: 2014-07-27 — End: 2014-07-27
  Administered 2014-07-27: 20 mL

## 2014-07-27 SURGICAL SUPPLY — 18 items
CANISTER SUCT 3000ML (MISCELLANEOUS) ×3 IMPLANT
CATH ROBINSON RED A/P 16FR (CATHETERS) ×2 IMPLANT
CLOTH BEACON ORANGE TIMEOUT ST (SAFETY) ×2 IMPLANT
CONTAINER PREFILL 10% NBF 60ML (FORM) ×4 IMPLANT
DEVICE MYOSURE CLASSIC (MISCELLANEOUS) ×1 IMPLANT
DEVICE MYOSURE LITE (MISCELLANEOUS) ×1 IMPLANT
ELECT REM PT RETURN 9FT ADLT (ELECTROSURGICAL) ×2
ELECTRODE REM PT RTRN 9FT ADLT (ELECTROSURGICAL) ×1 IMPLANT
GLOVE BIO SURGEON STRL SZ 6.5 (GLOVE) ×2 IMPLANT
GOWN STRL REUS W/TWL LRG LVL3 (GOWN DISPOSABLE) ×4 IMPLANT
MYOSURE XL FIBROID REM (MISCELLANEOUS)
PACK VAGINAL MINOR WOMEN LF (CUSTOM PROCEDURE TRAY) ×2 IMPLANT
PAD OB MATERNITY 4.3X12.25 (PERSONAL CARE ITEMS) ×2 IMPLANT
SYSTEM TISS REMOVAL MYSR XL RM (MISCELLANEOUS) IMPLANT
TOWEL OR 17X24 6PK STRL BLUE (TOWEL DISPOSABLE) ×4 IMPLANT
TUBING AQUILEX INFLOW (TUBING) ×2 IMPLANT
TUBING AQUILEX OUTFLOW (TUBING) ×2 IMPLANT
WATER STERILE IRR 1000ML POUR (IV SOLUTION) ×2 IMPLANT

## 2014-07-27 NOTE — Anesthesia Procedure Notes (Signed)
Procedure Name: LMA Insertion Date/Time: 07/27/2014 7:35 AM Performed by: Georgeanne Nim Pre-anesthesia Checklist: Patient identified, Patient being monitored, Timeout performed, Emergency Drugs available and Suction available Patient Re-evaluated:Patient Re-evaluated prior to inductionOxygen Delivery Method: Circle system utilized Preoxygenation: Pre-oxygenation with 100% oxygen (optimal sniffing position on blankets ) Intubation Type: IV induction LMA: LMA with gastric port inserted LMA Size: 4.0 Number of attempts: 1 Airway Equipment and Method: Patient positioned with wedge pillow Placement Confirmation: positive ETCO2,  CO2 detector and breath sounds checked- equal and bilateral Tube secured with: Tape Dental Injury: Teeth and Oropharynx as per pre-operative assessment

## 2014-07-27 NOTE — Op Note (Signed)
Operative Note    Preoperative Diagnosis Endometrial polyps   Postoperative Diagnosis Endometrial polyps and fibroids  Procedure Hysteroscopy with polypectomy and myomectomy using Myosure device  Surgeon Paula Compton  Anesthesia LMA  Fluids: EBL 50cc UOP 50 cc straight cath prior IVF  1200cc LR Hysteroscopic deficit 900cc  Findings 2 small polyps off the right sidewall and broad-based pedunculated fibroid in very top of fundus, remainder of cavity atrophic  Specimen Patient was taken to the operating room where LMA anesthesia was obtained without difficulty. She was then prepped and draped in the normal sterile fashion in the dorsal lithotomy position. An appropriate time out was performed. A speculum was then placed within the vagina and the anterior lip of the cervix identified and injected with approximately 2 cc of 1% plain lidocaine. An additional 9 cc each was placed at 2 and 10:00 for a paracervical block. Uterus was then sounded to approximately 8-9 cm and the [facility] dilators utilized to dilate the cervix up to approximately 24. The Myosure operating scope was then introduced into the cervix and the cavity inspected with findings as previously noted. The Myosure lite operating blade was then introduced through the scope and under direct visualization the polyps were removed in entirety.  A resection of the fibroid was begun, but the blade broke and we then switched to the Myosure classic blade as the fibroid was calcified and tough.  The fibroid was then resected down to the base and only a small lip removed.  There minimal bleeding at the conclusion of the removal. The operating scope was then removed from the cervix a small curette inserted into the uterine fundus.  All tissue specimens were handed off to pathology. The tenaculum was then removed from the cervix and the site rendered hemostatic with silver nitrate. Finally the speculum was removed from the vagina and the  patient awakened and taken to the recovery room in good condition.    Procedure Note

## 2014-07-27 NOTE — Progress Notes (Signed)
Patient ID: Megan [name], female   DOB: Sep 11, 1965, 49 y.o.   MRN: [id number] Pt states no changes in dictated H&P and brief exam WNL.  Ready to proceed.

## 2014-07-27 NOTE — Discharge Instructions (Signed)
°  Post Anesthesia Home Care Instructions ° °Activity: °Get plenty of rest for the remainder of the day. A responsible adult should stay with you for 24 hours following the procedure.  °For the next 24 hours, DO NOT: °-Drive a car °-Operate machinery °-Drink alcoholic beverages °-Take any medication unless instructed by your physician °-Make any legal decisions or sign important papers. ° °Meals: °Start with liquid foods such as gelatin or soup. Progress to regular foods as tolerated. Avoid greasy, spicy, heavy foods. If nausea and/or vomiting occur, drink only clear liquids until the nausea and/or vomiting subsides. Call your physician if vomiting continues. ° °Special Instructions/Symptoms: °Your throat may feel dry or sore from the anesthesia or the breathing tube placed in your throat during surgery. If this causes discomfort, gargle with warm salt water. The discomfort should disappear within 24 hours. ° °If you had a scopolamine patch placed behind your ear for the management of post- operative nausea and/or vomiting: ° °1. The medication in the patch is effective for 72 hours, after which it should be removed.  Wrap patch in a tissue and discard in the trash. Wash hands thoroughly with soap and water. °2. You may remove the patch earlier than 72 hours if you experience unpleasant side effects which may include dry mouth, dizziness or visual disturbances. °3. Avoid touching the patch. Wash your hands with soap and water after contact with the patch. °  °DISCHARGE INSTRUCTIONS: D&C / D&E °The following instructions have been prepared to help you care for yourself upon your return home. °  °Personal hygiene: °• Use sanitary pads for vaginal drainage, not tampons. °• Shower the day after your procedure. °• NO tub baths, pools or Jacuzzis for 2-3 weeks. °• Wipe front to back after using the bathroom. ° °Activity and limitations: °• Do NOT drive or operate any equipment for 24 hours. The effects of anesthesia are  still present and drowsiness may result. °• Do NOT rest in bed all day. °• Walking is encouraged. °• Walk up and down stairs slowly. °• You may resume your normal activity in one to two days or as indicated by your physician. ° °Sexual activity: NO intercourse for at least 2 weeks after the procedure, or as indicated by your physician. ° °Diet: Eat a light meal as desired this evening. You may resume your usual diet tomorrow. ° °Return to work: You may resume your work activities in one to two days or as indicated by your doctor. ° °What to expect after your surgery: Expect to have vaginal bleeding/discharge for 2-3 days and spotting for up to 10 days. It is not unusual to have soreness for up to 1-2 weeks. You may have a slight burning sensation when you urinate for the first day. Mild cramps may continue for a couple of days. You may have a regular period in 2-6 weeks. ° °Call your doctor for any of the following: °• Excessive vaginal bleeding, saturating and changing one pad every hour. °• Inability to urinate 6 hours after discharge from hospital. °• Pain not relieved by pain medication. °• Fever of 100.4° F or greater. °• Unusual vaginal discharge or odor. ° ° Call for an appointment:  ° ° °Patient’s signature: ______________________ ° °Nurse’s signature ________________________ ° °Support person's signature_______________________ ° ° ° °

## 2014-07-27 NOTE — Anesthesia Postprocedure Evaluation (Signed)
Anesthesia Post Note  Patient: Megan [name]  Procedure(s) Performed: Procedure(s) (LRB): DILATATION & CURETTAGE/HYSTEROSCOPY WITH MYOSURE (N/A)  Anesthesia type: General  Patient location: PACU  Post pain: Pain level controlled  Post assessment: Post-op Vital signs reviewed  Last Vitals:  Filed Vitals:   07/27/14 0900  BP: 149/84  Pulse: 52  Temp:   Resp: 15    Post vital signs: Reviewed  Level of consciousness: sedated  Complications: No apparent anesthesia complications

## 2014-07-27 NOTE — Transfer of Care (Signed)
Immediate Anesthesia Transfer of Care Note  Patient: Megan [name] [facility]  Procedure(s) Performed: Procedure(s): DILATATION & CURETTAGE/HYSTEROSCOPY WITH MYOSURE (N/A)  Patient Location: PACU  Anesthesia Type:General  Level of Consciousness: drowsy but responds to commands  Airway & Oxygen Therapy: Patient Spontanous Breathing and Patient connected to nasal cannula oxygen  Post-op Assessment: Report given to RN and Post -op Vital signs reviewed and stable  Post vital signs: Reviewed and stable  Last Vitals:  Filed Vitals:   07/27/14 0616  BP: 136/92  Pulse:   Temp:   Resp:     Complications: No apparent anesthesia complications

## 2014-07-30 ENCOUNTER — Encounter (HOSPITAL_COMMUNITY): Payer: Self-pay | Admitting: Obstetrics and Gynecology

## 2014-08-16 ENCOUNTER — Encounter: Payer: Self-pay | Admitting: Cardiovascular Disease

## 2014-08-20 ENCOUNTER — Other Ambulatory Visit: Payer: Self-pay | Admitting: Family

## 2014-08-21 ENCOUNTER — Ambulatory Visit (HOSPITAL_BASED_OUTPATIENT_CLINIC_OR_DEPARTMENT_OTHER): Payer: Medicare Other

## 2014-08-21 VITALS — BP 120/81 | HR 68 | Temp 98.4°F | Resp 18

## 2014-08-21 DIAGNOSIS — D510 Vitamin B12 deficiency anemia due to intrinsic factor deficiency: Secondary | ICD-10-CM

## 2014-08-21 MED ORDER — CYANOCOBALAMIN 1000 MCG/ML IJ SOLN
INTRAMUSCULAR | Status: AC
  Filled 2014-08-21: qty 1

## 2014-08-21 MED ORDER — CYANOCOBALAMIN 1000 MCG/ML IJ SOLN
1000.0000 ug | Freq: Once | INTRAMUSCULAR | Status: AC
  Administered 2014-08-21: 1000 ug via INTRAMUSCULAR

## 2014-08-21 NOTE — Patient Instructions (Signed)
Cyanocobalamin, Pyridoxine, and Folate What is this medicine? A multivitamin containing folic acid, vitamin B6, and vitamin B12. This medicine may be used for other purposes; ask your health care provider or pharmacist if you have questions. COMMON BRAND NAME(S): AllanFol RX, AllanTex, ComBgen, FaBB, Folamin, Folastin, [place], Liborio Negrin Torres, [place], [place], [place], Folgard RX, [place] RX 2.2, [place], [place] 2.2, Foltabs 800, Foltx, Homocysteine Formula, NuFol, [employer], Virt-Vite, Virt-Vite [place], Vita-Respa What should I tell my health care provider before I take this medicine? They need to know if you have any of these conditions: -bleeding or clotting disorder -history of anemia of any type -other chronic health condition -an unusual or allergic reaction to vitamins, other medicines, foods, dyes, or preservatives -pregnant or trying to get pregnant -breast-feeding How should I use this medicine? Take by mouth with a glass of water. May take with food. Follow the directions on the prescription label. It is usually given once a day. Do not take your medicine more often than directed. Contact your pediatrician regarding the use of this medicine in children. Special care may be needed. Overdosage: If you think you have taken too much of this medicine contact a poison control center or emergency room at once. NOTE: This medicine is only for you. Do not share this medicine with others. What if I miss a dose? If you miss a dose, take it as soon as you can. If it is almost time for your next dose, take only that dose. Do not take double or extra doses. What may interact with this medicine? -levodopa This list may not describe all possible interactions. Give your health care provider a list of all the medicines, herbs, non-prescription drugs, or dietary supplements you use. Also tell them if you smoke, drink alcohol, or use illegal drugs. Some items may interact with your medicine. What should I  watch for while using this medicine? See your health care professional for regular checks on your progress. Remember that vitamin supplements do not replace the need for good nutrition from a balanced diet. What side effects may I notice from receiving this medicine? Side effects that you should report to your doctor or health care professional as soon as possible: -allergic reaction such as skin rash or difficulty breathing -vomiting Side effects that usually do not require medical attention (report to your doctor or health care professional if they continue or are bothersome): -nausea -stomach upset This list may not describe all possible side effects. Call your doctor for medical advice about side effects. You may report side effects to FDA at 1-800-FDA-1088. Where should I keep my medicine? Keep out of the reach of children. Most vitamins should be stored at controlled room temperature. Check your specific product directions. Protect from heat and moisture. Throw away any unused medicine after the expiration date. NOTE: This sheet is a summary. It may not cover all possible information. If you have questions about this medicine, talk to your doctor, pharmacist, or health care provider.  2015, Elsevier/Gold Standard. (2007-02-20 00:59:55)

## 2014-08-23 ENCOUNTER — Telehealth: Payer: Self-pay | Admitting: Family

## 2014-08-23 NOTE — Telephone Encounter (Signed)
I cannot prescribe antibiotics over the phone.  Advise urgent care if she feels that she is unable to wait until Friday.

## 2014-08-23 NOTE — Telephone Encounter (Signed)
Left message for pt to call back  °

## 2014-08-23 NOTE — Telephone Encounter (Signed)
Relation to pt: self  Call back number: [phone] Pharmacy: \ [id number] - [place], [address] [phone] (Phone) [phone] (Fax)         Reason for call:  Patient states she is congested,coughing and the only available appointment is not until 7:45am Friday 08/25/14 with PCP. Patient states it takes several hours to get ready and currently theres no available slots with any other provider. Patient states NP has been her physician for several years and "knows me" please advise (patient refuses to go to urgent care.

## 2014-08-25 ENCOUNTER — Telehealth: Payer: Self-pay | Admitting: Family

## 2014-08-25 ENCOUNTER — Ambulatory Visit: Payer: Medicare Other | Admitting: Internal Medicine

## 2014-08-25 ENCOUNTER — Ambulatory Visit: Payer: Medicare Other | Admitting: Family

## 2014-08-25 NOTE — Telephone Encounter (Signed)
Pt came in late for appt today 08/25/14. Was scheduled for 2:30pm but states she was told 2:45pm. Pt was originally scheduled for 08/25/14 7:45am. Upon reminder call pt informed Tiffany that she would have to get up at 5:30am to get ready for the appt because it takes her a while to get moving due to meds and health issues. Pt states that she did talk with Tiffany and reschedule. She states at some point she received a phone call from [place], no documentation of any other calls/contact with the patient 08/24/14. Pt states that she was told on a voicemail that the appt was 2:45pm. Pt has been rescheduled with Melissa 08/28/14 1:15pm.   Charge for 08/25/14 2:30pm no show with Dr. Larose Kells?

## 2014-08-28 ENCOUNTER — Ambulatory Visit (INDEPENDENT_AMBULATORY_CARE_PROVIDER_SITE_OTHER): Payer: Medicare Other | Admitting: Family

## 2014-08-28 ENCOUNTER — Ambulatory Visit (HOSPITAL_BASED_OUTPATIENT_CLINIC_OR_DEPARTMENT_OTHER)
Admission: RE | Admit: 2014-08-28 | Discharge: 2014-08-28 | Disposition: A | Discharge status: Home / Self Care | Payer: Medicare Other | Source: Ambulatory Visit | Attending: Family | Admitting: Family

## 2014-08-28 ENCOUNTER — Encounter: Payer: Medicare Other | Attending: Family | Admitting: Dietician

## 2014-08-28 ENCOUNTER — Encounter: Payer: Self-pay | Admitting: Family

## 2014-08-28 ENCOUNTER — Telehealth: Payer: Self-pay | Admitting: Family

## 2014-08-28 ENCOUNTER — Encounter: Payer: Self-pay | Admitting: Dietician

## 2014-08-28 VITALS — BP 124/72 | HR 65 | Temp 97.9°F | Resp 18 | Ht 67.0 in | Wt 315.6 lb

## 2014-08-28 DIAGNOSIS — Z6841 Body Mass Index (BMI) 40.0 and over, adult: Secondary | ICD-10-CM | POA: Diagnosis not present

## 2014-08-28 DIAGNOSIS — R05 Cough: Secondary | ICD-10-CM | POA: Diagnosis not present

## 2014-08-28 DIAGNOSIS — J4521 Mild intermittent asthma with (acute) exacerbation: Secondary | ICD-10-CM

## 2014-08-28 DIAGNOSIS — R062 Wheezing: Secondary | ICD-10-CM | POA: Diagnosis not present

## 2014-08-28 DIAGNOSIS — R0989 Other specified symptoms and signs involving the circulatory and respiratory systems: Secondary | ICD-10-CM | POA: Insufficient documentation

## 2014-08-28 DIAGNOSIS — R509 Fever, unspecified: Secondary | ICD-10-CM | POA: Insufficient documentation

## 2014-08-28 DIAGNOSIS — E669 Obesity, unspecified: Secondary | ICD-10-CM

## 2014-08-28 DIAGNOSIS — R0602 Shortness of breath: Secondary | ICD-10-CM | POA: Insufficient documentation

## 2014-08-28 DIAGNOSIS — R059 Cough, unspecified: Secondary | ICD-10-CM

## 2014-08-28 DIAGNOSIS — Z713 Dietary counseling and surveillance: Secondary | ICD-10-CM | POA: Diagnosis not present

## 2014-08-28 HISTORY — DX: Obesity, unspecified: E66.9

## 2014-08-28 MED ORDER — FLUTICASONE-SALMETEROL 250-50 MCG/DOSE IN AEPB: 1.0000 | INHALATION_SPRAY | Freq: Two times a day (BID) | RESPIRATORY_TRACT | Status: DC

## 2014-08-28 MED ORDER — LEVOFLOXACIN 500 MG PO TABS: 500.0000 mg | ORAL_TABLET | Freq: Every day | ORAL | Status: DC

## 2014-08-28 MED ORDER — PREDNISONE 10 MG PO TABS: ORAL_TABLET | ORAL | Status: DC

## 2014-08-28 NOTE — Assessment & Plan Note (Signed)
Discussed that she is not a candidate for contrave due to opiate use.  We discussed adding exercise (water walking)- she has [employer] and keeping apt with nutritionist.

## 2014-08-28 NOTE — Progress Notes (Signed)
  Medical Nutrition Therapy:  Appt start time: 1500 end time:  1600.   Assessment:  Primary concerns today: Megan [name] is here today since she would like to live a healthier life. Referred for obesity. Has a lot of mobility and health issues. Grew up eating a lot fried foods. Has a very limited income ($800 per month and will spend $200 on food per month). Has been overweight her entire life. Has tried cutting out soft drinks, reading labels, and trying to not eat out much. Has gastroparesis mostly d/t narcotics.   Is in college now for criminal justice. Has not been able to work regularly since she had rods put in her back and had an immune system disorder. Lives with her mother who is sick and they share the bill. She does the food shopping and meal preparation. Mom eats different food that she does. Eats one solid meal per day. Feels better grazing then having large meals. Does food shopping at [employer] or [place]. Cannot afford farmer's market. Eats out 1-2 x week.   States that she is a "horrible cook". Likes sweets. Does feel hungry throughout the day. Doesn't have teeth so eats mostly soft foods.   Feels like she is not "putting the right foods together".   Preferred Learning Style:   No preference indicated   Learning Readiness:   Ready   MEDICATIONS: see list   DIETARY INTAKE:  Usual eating pattern includes 3-4 meals/snacks per day.   24-hr recall:  B ( AM): Dannon Light and Fit and Little Debbie oatmeal cookie or oatmeal with sugar and milk and sweet green tea  Snk ( AM): none L ( PM): Dannon Light and Fit or peanut butter and jelly  Snk ( PM): none D ( PM): baked potato or sometimes hamburger, pork chop, chicken with corn or green beans or pork and beans or fried corn bread, beans, and mashed potatoes or sandwich (egg or ham and cheese) Snk ( PM): Dannon Light and Fit or ice cream (1 x week) Beverages: a lot of sweet green tea, water   Usual physical activity:  none  Estimated energy needs: 1600 calories 180 g carbohydrates 120 g protein 44 g fat  Progress Towards Goal(s):  In progress.   Nutritional Diagnosis:  [place]-3.3 Overweight/obesity As related to excess sugar consumption and inadequate physical activity.  As evidenced by BMI of 49.3.    Intervention:  Nutrition counseling provided. Plan: Try tuna/egg with non fat Greek Yogurt. Check out [employer] or [employer] for cheaper groceries options.  Aim to fill half of your plate with vegetables at lunch and dinner. Have a quarter of your plate protein and a quarter of your plate starch/fruit. Choose whole wheat bread.  Choose lower fat dairy (milk or cottage cheese).  Try cooking spray to cook eggs.  Make green tea with Splenda. Have protein with carbs every time your eat.  Try having beans for meals.  Plan to walk in pool 1 x week. Or walk in your neighborhood. Increase when you can.   Teaching Method Utilized:  Visual Auditory Hands on  Handouts given during visit include:  MyPlate Handout  15 g CHO Snacks  Meal Card  Barriers to learning/adherence to lifestyle change: pain, medications, limited income  Demonstrated degree of understanding via:  Teach Back   Monitoring/Evaluation:  Dietary intake, exercise, and body weight prn.

## 2014-08-28 NOTE — Telephone Encounter (Signed)
CXR negative for pneumonia. Advise pt to start levaquin for sinusitis and start prednisone as we discussed at her visit. Is she taking erythromycin? If so, why?  She will need to stop erythromycin while on levaquin due to possible drug interaction.

## 2014-08-28 NOTE — Progress Notes (Signed)
Pre visit review using our clinic review tool, if applicable. No additional management support is needed unless otherwise documented below in the visit note. 

## 2014-08-28 NOTE — Patient Instructions (Signed)
Complete chest x ray on the first floor. Start prednisone taper and advair. Call if symptoms worsen or if not improved in 1 week.  Keep appointment with nutritionist.   Try water walking at the [facility].

## 2014-08-28 NOTE — Telephone Encounter (Signed)
no

## 2014-08-28 NOTE — Assessment & Plan Note (Signed)
Will obtain cxr to rule out pneumonia.  If neg for pneumonia will plan rx with abx for sinusitis. Rx with pred taper for asthma, resume advair, continue prn albuterol.

## 2014-08-28 NOTE — Patient Instructions (Addendum)
Try tuna/egg with non fat Greek Yogurt. Check out [employer] or [employer] for cheaper groceries options.  Aim to fill half of your plate with vegetables at lunch and dinner. Have a quarter of your plate protein and a quarter of your plate starch/fruit. Choose whole wheat bread.  Choose lower fat dairy (milk or cottage cheese).  Try cooking spray to cook eggs.  Make green tea with Splenda. Have protein with carbs every time your eat.  Try having beans for meals.  Plan to walk in pool 1 x week. Or walk in your neighborhood. Increase when you can.
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
## 2014-08-28 NOTE — Telephone Encounter (Signed)
Notified pt and she voices understanding. States she was taken off of erythromycin due to interaction with other medications. Med list updated.

## 2014-09-05 ENCOUNTER — Encounter: Payer: Self-pay | Admitting: Family

## 2014-10-20 ENCOUNTER — Ambulatory Visit: Payer: Medicare Other | Admitting: Family

## 2014-10-23 ENCOUNTER — Ambulatory Visit: Payer: Medicare Other | Admitting: Family

## 2014-10-23 ENCOUNTER — Ambulatory Visit: Payer: Medicare Other

## 2014-10-23 ENCOUNTER — Other Ambulatory Visit: Payer: Medicare Other

## 2014-10-24 ENCOUNTER — Other Ambulatory Visit: Payer: Self-pay | Admitting: Family

## 2014-11-21 ENCOUNTER — Telehealth: Payer: Self-pay

## 2014-11-21 NOTE — Telephone Encounter (Signed)
Called to schedule annual wellness visit no answer

## 2014-11-23 ENCOUNTER — Other Ambulatory Visit: Payer: Self-pay | Admitting: Family

## 2014-11-23 NOTE — Telephone Encounter (Signed)
30 day supply of synthroid sent to pharmacy. Pt is due for routine follow up with Melissa.  Please call pt to schedule appt before further refills are due.  Thanks!

## 2014-11-23 NOTE — Telephone Encounter (Signed)
Scheduled for 12/05/14

## 2014-12-05 ENCOUNTER — Encounter: Payer: Self-pay | Admitting: Family

## 2014-12-05 ENCOUNTER — Ambulatory Visit (INDEPENDENT_AMBULATORY_CARE_PROVIDER_SITE_OTHER): Payer: Medicare Other | Admitting: Family

## 2014-12-05 VITALS — BP 130/88 | HR 52 | Temp 98.6°F | Resp 18 | Ht 67.0 in

## 2014-12-05 DIAGNOSIS — I10 Essential (primary) hypertension: Secondary | ICD-10-CM | POA: Diagnosis not present

## 2014-12-05 DIAGNOSIS — Z23 Encounter for immunization: Secondary | ICD-10-CM

## 2014-12-05 DIAGNOSIS — H578 Other specified disorders of eye and adnexa: Secondary | ICD-10-CM | POA: Diagnosis not present

## 2014-12-05 DIAGNOSIS — H5789 Other specified disorders of eye and adnexa: Secondary | ICD-10-CM

## 2014-12-05 DIAGNOSIS — E039 Hypothyroidism, unspecified: Secondary | ICD-10-CM | POA: Diagnosis not present

## 2014-12-05 MED ORDER — NEOMYCIN-POLYMYXIN-HC 3.5-10000-1 OP SUSP: 3.0000 [drp] | Freq: Three times a day (TID) | OPHTHALMIC | Status: DC

## 2014-12-05 NOTE — Assessment & Plan Note (Signed)
I applauded pt for quitting smoking.

## 2014-12-05 NOTE — Assessment & Plan Note (Signed)
Clinically stable on synthroid, obtain tsh. 

## 2014-12-05 NOTE — Progress Notes (Signed)
Pre visit review using our clinic review tool, if applicable. No additional management support is needed unless otherwise documented below in the visit note. 

## 2014-12-05 NOTE — Patient Instructions (Addendum)
Please start eye drops.  Complete lab work prior to leaving. You will be contacted about your referral to opthalmology.  Good luck quitting smoking!
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
## 2014-12-06 ENCOUNTER — Encounter: Payer: Self-pay | Admitting: Family

## 2014-12-06 LAB — BASIC METABOLIC PANEL
BUN: 12 mg/dL (ref 6–23)
CO2: 30 mEq/L (ref 19–32)
Calcium: 9.1 mg/dL (ref 8.4–10.5)
Chloride: 101 mEq/L (ref 96–112)
Creatinine, Ser: 0.62 mg/dL (ref 0.40–1.20)
GFR: 107.56 mL/min (ref >60.00)
Glucose, Bld: 77 mg/dL (ref 70–99)
Potassium: 4.4 mEq/L (ref 3.5–5.1)
Sodium: 139 mEq/L (ref 135–145)

## 2014-12-06 LAB — TSH: TSH: 2.01 u[IU]/mL (ref 0.35–4.50)

## 2014-12-27 ENCOUNTER — Other Ambulatory Visit: Payer: Self-pay | Admitting: Family

## 2015-02-26 LAB — HM DIABETES EYE EXAM

## 2015-03-20 ENCOUNTER — Encounter: Payer: Self-pay | Admitting: Family

## 2015-03-29 ENCOUNTER — Other Ambulatory Visit: Payer: Self-pay | Admitting: Physical Medicine and Rehabilitation

## 2015-03-29 DIAGNOSIS — M5416 Radiculopathy, lumbar region: Secondary | ICD-10-CM

## 2015-04-27 ENCOUNTER — Other Ambulatory Visit: Payer: Self-pay | Admitting: Family

## 2015-06-08 ENCOUNTER — Telehealth: Payer: Self-pay | Admitting: Family

## 2015-06-08 ENCOUNTER — Ambulatory Visit: Payer: Medicare Other | Admitting: Family

## 2015-06-08 MED ORDER — LISINOPRIL 20 MG PO TABS: 20.0000 mg | ORAL_TABLET | Freq: Every day | ORAL | Status: DC

## 2015-06-08 MED ORDER — SYNTHROID 200 MCG PO TABS: ORAL_TABLET | ORAL | Status: DC

## 2015-06-08 NOTE — Telephone Encounter (Signed)
Refill request for Lisinopril and also SYNTHROID      Pharmacy: [id number] - [place], [place]

## 2015-06-08 NOTE — Telephone Encounter (Signed)
30 day supply sent. Pt is due for 6 month follow up. Please call pt to schedule f/u. Thanks!

## 2015-06-11 ENCOUNTER — Other Ambulatory Visit: Payer: Self-pay | Admitting: Family

## 2015-06-14 NOTE — Telephone Encounter (Signed)
Please call patient to schedule as note requested

## 2015-06-14 NOTE — Telephone Encounter (Signed)
No answer or mach home #, left msg on cell # for pt to call and schedule appt

## 2015-06-25 ENCOUNTER — Other Ambulatory Visit: Payer: Self-pay | Admitting: Physical Medicine and Rehabilitation

## 2015-06-25 DIAGNOSIS — M5416 Radiculopathy, lumbar region: Secondary | ICD-10-CM

## 2015-07-02 ENCOUNTER — Other Ambulatory Visit: Payer: Self-pay | Admitting: Family

## 2015-07-05 ENCOUNTER — Ambulatory Visit
Admission: RE | Admit: 2015-07-05 | Discharge: 2015-07-05 | Disposition: A | Discharge status: Home / Self Care | Payer: Medicare Other | Source: Ambulatory Visit | Attending: Physical Medicine and Rehabilitation | Admitting: Physical Medicine and Rehabilitation

## 2015-07-05 DIAGNOSIS — M5416 Radiculopathy, lumbar region: Secondary | ICD-10-CM

## 2015-07-05 MED ORDER — GADOBENATE DIMEGLUMINE 529 MG/ML IV SOLN
20.0000 mL | Freq: Once | INTRAVENOUS | Status: AC | PRN
  Administered 2015-07-05: 20 mL via INTRAVENOUS

## 2015-07-09 ENCOUNTER — Telehealth: Payer: Self-pay | Admitting: Family

## 2015-07-09 ENCOUNTER — Ambulatory Visit: Payer: Medicare Other | Admitting: Family

## 2015-07-09 DIAGNOSIS — Z0289 Encounter for other administrative examinations: Secondary | ICD-10-CM

## 2015-07-09 NOTE — Telephone Encounter (Signed)
Pt called in at 3:11p. To inform provider that she will not be at her 3:15 appt.  Pt rescheduled appt for Thursday.    Please advise

## 2015-07-09 NOTE — Telephone Encounter (Signed)
No charge. 

## 2015-07-12 ENCOUNTER — Other Ambulatory Visit: Payer: Self-pay | Admitting: Family

## 2015-07-12 ENCOUNTER — Ambulatory Visit (INDEPENDENT_AMBULATORY_CARE_PROVIDER_SITE_OTHER): Payer: Medicare Other | Admitting: Family

## 2015-07-12 ENCOUNTER — Telehealth: Payer: Self-pay | Admitting: Family

## 2015-07-12 ENCOUNTER — Encounter: Payer: Self-pay | Admitting: Family

## 2015-07-12 VITALS — BP 110/80 | HR 58 | Temp 97.8°F | Resp 16 | Ht 67.0 in | Wt 294.2 lb

## 2015-07-12 DIAGNOSIS — E079 Disorder of thyroid, unspecified: Secondary | ICD-10-CM

## 2015-07-12 DIAGNOSIS — J4520 Mild intermittent asthma, uncomplicated: Secondary | ICD-10-CM | POA: Diagnosis not present

## 2015-07-12 DIAGNOSIS — E039 Hypothyroidism, unspecified: Secondary | ICD-10-CM

## 2015-07-12 DIAGNOSIS — R5383 Other fatigue: Secondary | ICD-10-CM

## 2015-07-12 DIAGNOSIS — I10 Essential (primary) hypertension: Secondary | ICD-10-CM

## 2015-07-12 DIAGNOSIS — M545 Low back pain, unspecified: Secondary | ICD-10-CM

## 2015-07-12 DIAGNOSIS — G8929 Other chronic pain: Secondary | ICD-10-CM

## 2015-07-12 MED ORDER — FLUTICASONE-SALMETEROL 250-50 MCG/DOSE IN AEPB: 1.0000 | INHALATION_SPRAY | Freq: Two times a day (BID) | RESPIRATORY_TRACT | Status: DC

## 2015-07-12 MED ORDER — SYNTHROID 200 MCG PO TABS: ORAL_TABLET | ORAL | Status: DC

## 2015-07-12 NOTE — Telephone Encounter (Signed)
Please ask patient to repeat tsh in 1 month please.

## 2015-07-12 NOTE — Patient Instructions (Signed)
Please complete lab work prior to leaving. Restart synthroid. Schedule thyroid ultrasound on the first floor.

## 2015-07-12 NOTE — Telephone Encounter (Signed)
Scheduled lab appt for 08/10/15 at 2pm. Pt aware and lab order entered.

## 2015-07-12 NOTE — Progress Notes (Signed)
Pre visit review using our clinic review tool, if applicable. No additional management support is needed unless otherwise documented below in the visit note. 
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
## 2015-07-13 LAB — CBC WITH DIFFERENTIAL/PLATELET
Basophils Absolute: 0.1 10*3/uL (ref 0.0–0.1)
Basophils Relative: 1.9 % (ref 0.0–3.0)
Eosinophils Absolute: 0.2 10*3/uL (ref 0.0–0.7)
Eosinophils Relative: 3.7 % (ref 0.0–5.0)
HCT: 41.2 % (ref 36.0–46.0)
Hemoglobin: 13.8 g/dL (ref 12.0–15.0)
Lymphocytes Relative: 23.9 % (ref 12.0–46.0)
Lymphs Abs: 1 10*3/uL (ref 0.7–4.0)
MCHC: 33.5 g/dL (ref 30.0–36.0)
MCV: 83.9 fl (ref 78.0–100.0)
Monocytes Absolute: 0.2 10*3/uL (ref 0.1–1.0)
Monocytes Relative: 4.6 % (ref 3.0–12.0)
Neutro Abs: 2.8 10*3/uL (ref 1.4–7.7)
Neutrophils Relative %: 65.9 % (ref 43.0–77.0)
Platelets: 153 10*3/uL (ref 150.0–400.0)
RBC: 4.91 Mil/uL (ref 3.87–5.11)
RDW: 14.2 % (ref 11.5–15.5)
WBC: 4.2 10*3/uL (ref 4.0–10.5)

## 2015-07-13 LAB — BASIC METABOLIC PANEL
BUN: 8 mg/dL (ref 6–23)
CO2: 31 mEq/L (ref 19–32)
Calcium: 9.1 mg/dL (ref 8.4–10.5)
Chloride: 101 mEq/L (ref 96–112)
Creatinine, Ser: 0.62 mg/dL (ref 0.40–1.20)
GFR: 107.31 mL/min (ref >60.00)
Glucose, Bld: 90 mg/dL (ref 70–99)
Potassium: 3.8 mEq/L (ref 3.5–5.1)
Sodium: 137 mEq/L (ref 135–145)

## 2015-07-14 ENCOUNTER — Encounter: Payer: Self-pay | Admitting: Family

## 2015-07-14 ENCOUNTER — Ambulatory Visit (HOSPITAL_BASED_OUTPATIENT_CLINIC_OR_DEPARTMENT_OTHER)
Admission: RE | Admit: 2015-07-14 | Discharge: 2015-07-14 | Disposition: A | Discharge status: Home / Self Care | Payer: Medicare Other | Source: Ambulatory Visit | Attending: Family | Admitting: Family

## 2015-07-14 DIAGNOSIS — E079 Disorder of thyroid, unspecified: Secondary | ICD-10-CM

## 2015-07-25 ENCOUNTER — Telehealth: Payer: Self-pay | Admitting: *Deleted

## 2015-07-25 NOTE — Telephone Encounter (Signed)
Called and spoke with the pt and informed her of the note below.  Pt verbalized understanding and agreed.//AB/CMA 

## 2015-07-25 NOTE — Telephone Encounter (Signed)
Nothing further needs to be done at this time. Continue synthroid.

## 2015-07-25 NOTE — Telephone Encounter (Signed)
Called and spoke with the pt and informed her of recent US results and note.  Pt verbalized understanding.   Pt wanted to know if anything else needs to be done?  Please advise.//AB/CMA

## 2015-07-25 NOTE — Telephone Encounter (Signed)
-----   Message from [name], NP sent at 07/15/2015  9:25 AM EDT ----- Thyroid is slightly enlarged, but no nodule.  Good news.

## 2015-08-02 ENCOUNTER — Other Ambulatory Visit: Payer: Self-pay | Admitting: Family

## 2015-08-08 ENCOUNTER — Other Ambulatory Visit: Payer: Self-pay | Admitting: Family

## 2015-08-10 ENCOUNTER — Other Ambulatory Visit (INDEPENDENT_AMBULATORY_CARE_PROVIDER_SITE_OTHER): Payer: Medicare Other

## 2015-08-10 DIAGNOSIS — E039 Hypothyroidism, unspecified: Secondary | ICD-10-CM | POA: Diagnosis not present

## 2015-08-10 LAB — TSH: TSH: 0.33 mIU/L — ABNORMAL LOW

## 2015-08-10 NOTE — Addendum Note (Signed)
Addended by: Caffie Pinto on: 08/10/2015 02:16 PM   Modules accepted: Orders

## 2015-08-12 ENCOUNTER — Telehealth: Payer: Self-pay | Admitting: Family

## 2015-08-12 DIAGNOSIS — E039 Hypothyroidism, unspecified: Secondary | ICD-10-CM

## 2015-08-12 MED ORDER — LEVOTHYROXINE SODIUM 175 MCG PO TABS: 175.0000 ug | ORAL_TABLET | Freq: Every day | ORAL | 2 refills | Status: DC

## 2015-08-12 NOTE — Telephone Encounter (Signed)
Lab work shows that we need to decrease synthroid.  Rx sent for 173mcg, repeat TSH in 6 weeks.

## 2015-08-14 NOTE — Telephone Encounter (Signed)
Notified pt and scheduled lab appt for 09/28/15; future order entered.

## 2015-09-28 ENCOUNTER — Other Ambulatory Visit: Payer: Medicare Other

## 2015-10-29 ENCOUNTER — Other Ambulatory Visit: Payer: Self-pay | Admitting: Family

## 2015-11-06 ENCOUNTER — Other Ambulatory Visit: Payer: Self-pay | Admitting: Family Medicine

## 2015-11-06 NOTE — Telephone Encounter (Signed)
Called and spoke to the patients mom who promised to give the patient the message to call office tomorrow/schedule lab appt. In order to continue getting her medication.

## 2015-11-06 NOTE — Telephone Encounter (Signed)
lvm for pt advising to schedule a lab appt.

## 2015-11-06 NOTE — Telephone Encounter (Signed)
30 day supply of levothyroxine sent to pharmacy. Pt did not complete repeat TSH in September. Please call pt to schedule lab appt soon. Cannot give further refills until lab is repeated. Thanks!

## 2015-11-07 NOTE — Telephone Encounter (Signed)
Mailed letter to pt

## 2015-11-21 ENCOUNTER — Telehealth: Payer: Self-pay | Admitting: *Deleted

## 2015-11-21 MED ORDER — METOPROLOL TARTRATE 25 MG PO TABS: ORAL_TABLET | ORAL | 0 refills | Status: DC

## 2015-11-21 MED ORDER — LISINOPRIL 20 MG PO TABS: 20.0000 mg | ORAL_TABLET | Freq: Every day | ORAL | 0 refills | Status: DC

## 2015-11-21 NOTE — Telephone Encounter (Signed)
shiquita-- please call pt to schedule a follow up appt with Melissa in December. Thanks!

## 2015-11-21 NOTE — Telephone Encounter (Signed)
12/17 please.

## 2015-11-21 NOTE — Telephone Encounter (Signed)
Received fax from [facility] requesting Rx for 90 day supply of lisinopril and metoprolol. Rxs sent. Pt last seen 06/2015 and has no future appts scheduled. Please advise when pt should follow up in the office?

## 2015-11-26 ENCOUNTER — Ambulatory Visit (INDEPENDENT_AMBULATORY_CARE_PROVIDER_SITE_OTHER): Payer: Medicare Other | Admitting: Family

## 2015-11-26 ENCOUNTER — Encounter: Payer: Self-pay | Admitting: Family

## 2015-11-26 VITALS — BP 130/98 | HR 61 | Temp 97.7°F | Resp 16 | Ht 67.0 in | Wt 302.6 lb

## 2015-11-26 DIAGNOSIS — R1011 Right upper quadrant pain: Secondary | ICD-10-CM | POA: Diagnosis not present

## 2015-11-26 DIAGNOSIS — K754 Autoimmune hepatitis: Secondary | ICD-10-CM | POA: Diagnosis not present

## 2015-11-26 DIAGNOSIS — R252 Cramp and spasm: Secondary | ICD-10-CM | POA: Diagnosis not present

## 2015-11-26 DIAGNOSIS — Z23 Encounter for immunization: Secondary | ICD-10-CM

## 2015-11-26 DIAGNOSIS — E039 Hypothyroidism, unspecified: Secondary | ICD-10-CM

## 2015-11-26 DIAGNOSIS — I10 Essential (primary) hypertension: Secondary | ICD-10-CM | POA: Diagnosis not present

## 2015-11-26 MED ORDER — LISINOPRIL 20 MG PO TABS: 30.0000 mg | ORAL_TABLET | Freq: Every day | ORAL | 0 refills | Status: DC
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
## 2015-11-26 NOTE — Patient Instructions (Addendum)
Please complete lab work prior to leaving. You will be contacted about scheduling your abdominal ultrasound.  Call if new/worsening symptoms.   Increase Lisinopril to 1 1/2 tablets once a day. Return for a nurse visit in 2 weeks to recheck Blood Pressure.

## 2015-11-26 NOTE — Progress Notes (Signed)
Pre visit review using our clinic review tool, if applicable. No additional management support is needed unless otherwise documented below in the visit note. 

## 2015-11-26 NOTE — Telephone Encounter (Signed)
Called to schedule pt. Pt is in the office. Will be sure to have her schedule a F/U in December at checkout

## 2015-11-27 LAB — BASIC METABOLIC PANEL
BUN: 9 mg/dL (ref 6–23)
CO2: 32 mEq/L (ref 19–32)
Calcium: 8.9 mg/dL (ref 8.4–10.5)
Chloride: 101 mEq/L (ref 96–112)
Creatinine, Ser: 0.61 mg/dL (ref 0.40–1.20)
GFR: 109.18 mL/min (ref >60.00)
Glucose, Bld: 88 mg/dL (ref 70–99)
Potassium: 4.2 mEq/L (ref 3.5–5.1)
Sodium: 137 mEq/L (ref 135–145)

## 2015-11-27 LAB — HEPATIC FUNCTION PANEL
ALT: 37 U/L — ABNORMAL HIGH (ref 0–35)
AST: 37 U/L (ref 0–37)
Albumin: 3.7 g/dL (ref 3.5–5.2)
Alkaline Phosphatase: 104 U/L (ref 39–117)
Bilirubin, Direct: 0.1 mg/dL (ref 0.0–0.3)
Total Bilirubin: 0.4 mg/dL (ref 0.2–1.2)
Total Protein: 7.3 g/dL (ref 6.0–8.3)

## 2015-11-27 LAB — TSH: TSH: 1.62 u[IU]/mL (ref 0.35–4.50)

## 2015-11-27 LAB — MAGNESIUM: Magnesium: 1.9 mg/dL (ref 1.5–2.5)

## 2015-11-28 ENCOUNTER — Ambulatory Visit (HOSPITAL_BASED_OUTPATIENT_CLINIC_OR_DEPARTMENT_OTHER)
Admission: RE | Admit: 2015-11-28 | Discharge: 2015-11-28 | Disposition: A | Discharge status: Home / Self Care | Payer: Medicare Other | Source: Ambulatory Visit | Attending: Family | Admitting: Family

## 2015-11-28 ENCOUNTER — Telehealth: Payer: Self-pay | Admitting: *Deleted

## 2015-11-28 ENCOUNTER — Other Ambulatory Visit: Payer: Self-pay | Admitting: Family Medicine

## 2015-11-28 DIAGNOSIS — K7689 Other specified diseases of liver: Secondary | ICD-10-CM | POA: Diagnosis not present

## 2015-11-28 DIAGNOSIS — R1011 Right upper quadrant pain: Secondary | ICD-10-CM | POA: Insufficient documentation

## 2015-11-28 DIAGNOSIS — Z9049 Acquired absence of other specified parts of digestive tract: Secondary | ICD-10-CM | POA: Diagnosis not present

## 2015-11-28 NOTE — Telephone Encounter (Signed)
Spoke with pt. She states injection site of recent flu vaccine is red, puffy and arm is still sore even though she is taking oxycodone. Scheduled pt appt for evaluation for Friday at 1pm. Pt states she will cancel appt if symptoms improve.

## 2015-11-30 ENCOUNTER — Telehealth: Payer: Self-pay | Admitting: *Deleted

## 2015-11-30 ENCOUNTER — Telehealth: Payer: Self-pay | Admitting: Family

## 2015-11-30 ENCOUNTER — Ambulatory Visit: Payer: Medicare Other | Admitting: Family

## 2015-11-30 NOTE — Telephone Encounter (Signed)
Please do not apply no show fee.  Appt removed from system as I was aware that she may cancel with late notice when I scheduled her appt.

## 2015-11-30 NOTE — Telephone Encounter (Signed)
Pt requested copy of abdominal u/s from 11/28/15 be faxed to [name]. Report faxed.

## 2015-11-30 NOTE — Telephone Encounter (Signed)
Pt lvm at 8:04 to cancel appt due to her no longer needing it.

## 2015-12-12 ENCOUNTER — Ambulatory Visit (INDEPENDENT_AMBULATORY_CARE_PROVIDER_SITE_OTHER): Payer: Medicare Other | Admitting: Family Medicine

## 2015-12-12 ENCOUNTER — Ambulatory Visit: Payer: Medicare Other

## 2015-12-12 ENCOUNTER — Emergency Department (HOSPITAL_BASED_OUTPATIENT_CLINIC_OR_DEPARTMENT_OTHER): Payer: Medicare Other

## 2015-12-12 ENCOUNTER — Encounter (HOSPITAL_BASED_OUTPATIENT_CLINIC_OR_DEPARTMENT_OTHER): Payer: Self-pay | Admitting: *Deleted

## 2015-12-12 ENCOUNTER — Emergency Department (HOSPITAL_BASED_OUTPATIENT_CLINIC_OR_DEPARTMENT_OTHER)
Admission: EM | Admit: 2015-12-12 | Discharge: 2015-12-12 | Disposition: A | Discharge status: Home / Self Care | Payer: Medicare Other | Attending: Emergency Medicine | Admitting: Emergency Medicine

## 2015-12-12 VITALS — BP 141/73 | HR 70

## 2015-12-12 DIAGNOSIS — Y929 Unspecified place or not applicable: Secondary | ICD-10-CM | POA: Diagnosis not present

## 2015-12-12 DIAGNOSIS — W010XXA Fall on same level from slipping, tripping and stumbling without subsequent striking against object, initial encounter: Secondary | ICD-10-CM | POA: Insufficient documentation

## 2015-12-12 DIAGNOSIS — S93402A Sprain of unspecified ligament of left ankle, initial encounter: Secondary | ICD-10-CM | POA: Diagnosis not present

## 2015-12-12 DIAGNOSIS — J45909 Unspecified asthma, uncomplicated: Secondary | ICD-10-CM | POA: Insufficient documentation

## 2015-12-12 DIAGNOSIS — Z7982 Long term (current) use of aspirin: Secondary | ICD-10-CM | POA: Insufficient documentation

## 2015-12-12 DIAGNOSIS — S99912A Unspecified injury of left ankle, initial encounter: Secondary | ICD-10-CM | POA: Diagnosis present

## 2015-12-12 DIAGNOSIS — I10 Essential (primary) hypertension: Secondary | ICD-10-CM | POA: Diagnosis not present

## 2015-12-12 DIAGNOSIS — E119 Type 2 diabetes mellitus without complications: Secondary | ICD-10-CM | POA: Diagnosis not present

## 2015-12-12 DIAGNOSIS — J449 Chronic obstructive pulmonary disease, unspecified: Secondary | ICD-10-CM | POA: Diagnosis not present

## 2015-12-12 DIAGNOSIS — Y939 Activity, unspecified: Secondary | ICD-10-CM | POA: Diagnosis not present

## 2015-12-12 DIAGNOSIS — Z87891 Personal history of nicotine dependence: Secondary | ICD-10-CM | POA: Diagnosis not present

## 2015-12-12 DIAGNOSIS — E039 Hypothyroidism, unspecified: Secondary | ICD-10-CM | POA: Diagnosis not present

## 2015-12-12 DIAGNOSIS — Z79899 Other long term (current) drug therapy: Secondary | ICD-10-CM | POA: Diagnosis not present

## 2015-12-12 DIAGNOSIS — Y999 Unspecified external cause status: Secondary | ICD-10-CM | POA: Insufficient documentation

## 2015-12-12 NOTE — Progress Notes (Signed)
Noted. Noncompliance. Follow plan and f/u up with Ms. O'Sullivan in 2 weeks.

## 2015-12-12 NOTE — ED Notes (Signed)
Patient refused the aircast ankle splint.

## 2015-12-12 NOTE — Progress Notes (Signed)
Pre visit review using our clinic review tool, if applicable. No additional management support is needed unless otherwise documented below in the visit note.  Per OV note 11/26/15: Increase Lisinopril to 1 1/2 tablets once a day. Return for a nurse visit in 2 weeks to recheck Blood Pressure.  Pt reports taking Lisinopril 20mg  on some days and  30 mg on other days just because she forgets other 1/2 tab.  Pt c/o left foot pain. States she fell this morning. Will escort pt to ED.  Per Dr.Wendling: Cont taking BP med as prescribed and follow up with Melissa NP in 2 weeks.

## 2015-12-12 NOTE — ED Notes (Signed)
Per EMT,the pt refused ortho air cast, ambulatory to lobby for d/c

## 2015-12-12 NOTE — ED Triage Notes (Signed)
Pt c/o fall x 1 day ago with left ankle injury

## 2015-12-12 NOTE — Discharge Instructions (Signed)
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
## 2015-12-12 NOTE — Patient Instructions (Addendum)
  Per D r.Wendling: Continue taking BP medicine as prescribed and follow up with [name] NP in 2 weeks.

## 2015-12-14 ENCOUNTER — Other Ambulatory Visit: Payer: Self-pay | Admitting: Family

## 2015-12-14 NOTE — Telephone Encounter (Signed)
Refill sent per [facility] refill protocol/SLS  

## 2015-12-26 ENCOUNTER — Ambulatory Visit: Payer: Medicare Other | Admitting: Family

## 2015-12-26 DIAGNOSIS — Z0289 Encounter for other administrative examinations: Secondary | ICD-10-CM

## 2016-01-02 ENCOUNTER — Ambulatory Visit: Payer: Medicare Other | Admitting: Family

## 2016-01-03 ENCOUNTER — Encounter: Payer: Self-pay | Admitting: Family

## 2016-01-03 ENCOUNTER — Telehealth: Payer: Self-pay | Admitting: Family

## 2016-01-03 NOTE — Telephone Encounter (Signed)
Patient was a No Show on December 20. Previous No Shows on 12/13, 9/15, 6/26. Charge or No Charge?

## 2016-01-03 NOTE — Telephone Encounter (Signed)
Yes please

## 2016-01-18 ENCOUNTER — Other Ambulatory Visit: Payer: Self-pay | Admitting: Family

## 2016-01-21 ENCOUNTER — Ambulatory Visit (INDEPENDENT_AMBULATORY_CARE_PROVIDER_SITE_OTHER): Payer: Medicare Other | Admitting: Family

## 2016-01-21 DIAGNOSIS — Z0289 Encounter for other administrative examinations: Secondary | ICD-10-CM

## 2016-01-22 ENCOUNTER — Encounter: Payer: Self-pay | Admitting: Family

## 2016-01-31 ENCOUNTER — Other Ambulatory Visit: Payer: Self-pay | Admitting: Family

## 2016-02-08 ENCOUNTER — Ambulatory Visit: Payer: Medicare Other | Admitting: Family

## 2016-02-11 ENCOUNTER — Encounter: Payer: Self-pay | Admitting: Family

## 2016-02-15 ENCOUNTER — Encounter: Payer: Self-pay | Admitting: Family

## 2016-02-15 ENCOUNTER — Ambulatory Visit (INDEPENDENT_AMBULATORY_CARE_PROVIDER_SITE_OTHER): Payer: Medicare Other | Admitting: Family

## 2016-02-15 VITALS — BP 142/89 | HR 65 | Temp 98.0°F | Ht 67.0 in | Wt 303.0 lb

## 2016-02-15 DIAGNOSIS — I10 Essential (primary) hypertension: Secondary | ICD-10-CM

## 2016-02-15 DIAGNOSIS — R7989 Other specified abnormal findings of blood chemistry: Secondary | ICD-10-CM

## 2016-02-15 DIAGNOSIS — R945 Abnormal results of liver function studies: Secondary | ICD-10-CM

## 2016-02-15 DIAGNOSIS — E039 Hypothyroidism, unspecified: Secondary | ICD-10-CM | POA: Diagnosis not present

## 2016-02-15 LAB — HEPATIC FUNCTION PANEL
ALT: 30 U/L — ABNORMAL HIGH (ref 6–29)
AST: 35 U/L (ref 10–35)
Albumin: 3.4 g/dL — ABNORMAL LOW (ref 3.6–5.1)
Alkaline Phosphatase: 99 U/L (ref 33–130)
Bilirubin, Direct: 0.1 mg/dL (ref <=0.2)
Indirect Bilirubin: 0.3 mg/dL (ref 0.2–1.2)
Total Bilirubin: 0.4 mg/dL (ref 0.2–1.2)
Total Protein: 6.9 g/dL (ref 6.1–8.1)

## 2016-02-15 LAB — BASIC METABOLIC PANEL
BUN: 8 mg/dL (ref 7–25)
CO2: 29 mmol/L (ref 20–31)
Calcium: 8.7 mg/dL (ref 8.6–10.4)
Chloride: 103 mmol/L (ref 98–110)
Creat: 0.69 mg/dL (ref 0.50–1.05)
Glucose, Bld: 95 mg/dL (ref 65–99)
Potassium: 4.5 mmol/L (ref 3.5–5.3)
Sodium: 138 mmol/L (ref 135–146)

## 2016-02-15 LAB — TSH: TSH: 2.14 mIU/L

## 2016-02-15 MED ORDER — LISINOPRIL 20 MG PO TABS: 30.0000 mg | ORAL_TABLET | Freq: Every day | ORAL | 1 refills | Status: DC

## 2016-02-15 MED ORDER — METOPROLOL TARTRATE 25 MG PO TABS: ORAL_TABLET | ORAL | 1 refills | Status: DC

## 2016-02-15 MED ORDER — OXYCODONE HCL 10 MG PO TABS: 10.0000 mg | ORAL_TABLET | Freq: Four times a day (QID) | ORAL | 0 refills | Status: DC | PRN

## 2016-02-15 NOTE — Assessment & Plan Note (Signed)
Clinically stable on synthroid, continue same.  

## 2016-02-15 NOTE — Addendum Note (Signed)
Addended by: [name] on: 02/15/2016 04:22 PM   Modules accepted: Orders

## 2016-02-15 NOTE — Assessment & Plan Note (Signed)
BP slightly above goal. Likely related to pain/headache.  Continue current BP meds.  Obtain follow up bmet.

## 2016-02-15 NOTE — Progress Notes (Signed)
Pre visit review using our clinic review tool, if applicable. No additional management support is needed unless otherwise documented below in the visit note. 

## 2016-02-15 NOTE — Addendum Note (Signed)
Addended by: [name] on: 02/15/2016 04:24 PM   Modules accepted: Orders
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
## 2016-02-15 NOTE — Patient Instructions (Signed)
Please complete lab work prior to leaving.   

## 2016-02-16 ENCOUNTER — Encounter: Payer: Self-pay | Admitting: Family

## 2016-02-21 ENCOUNTER — Other Ambulatory Visit: Payer: Self-pay | Admitting: Family

## 2016-02-27 IMAGING — CR DG THORACIC SPINE 2V
3 series · 3 of 3 positions shown · non-contrast
Comparison: None.

CLINICAL DATA: Thoracic back pain after fall.

EXAM:
THORACIC SPINE - 2 VIEW

[w t-spine a.p. *]
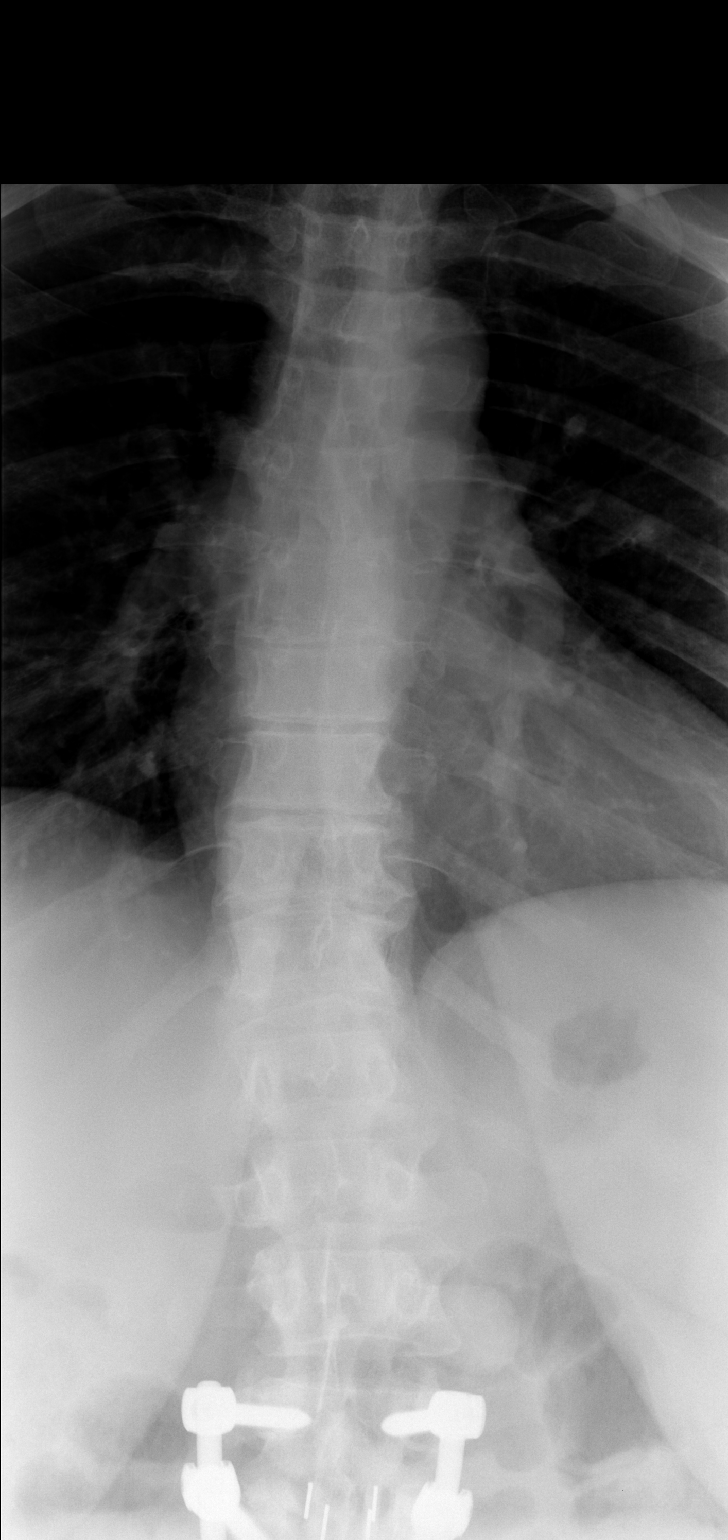

[w t-spine lat *]
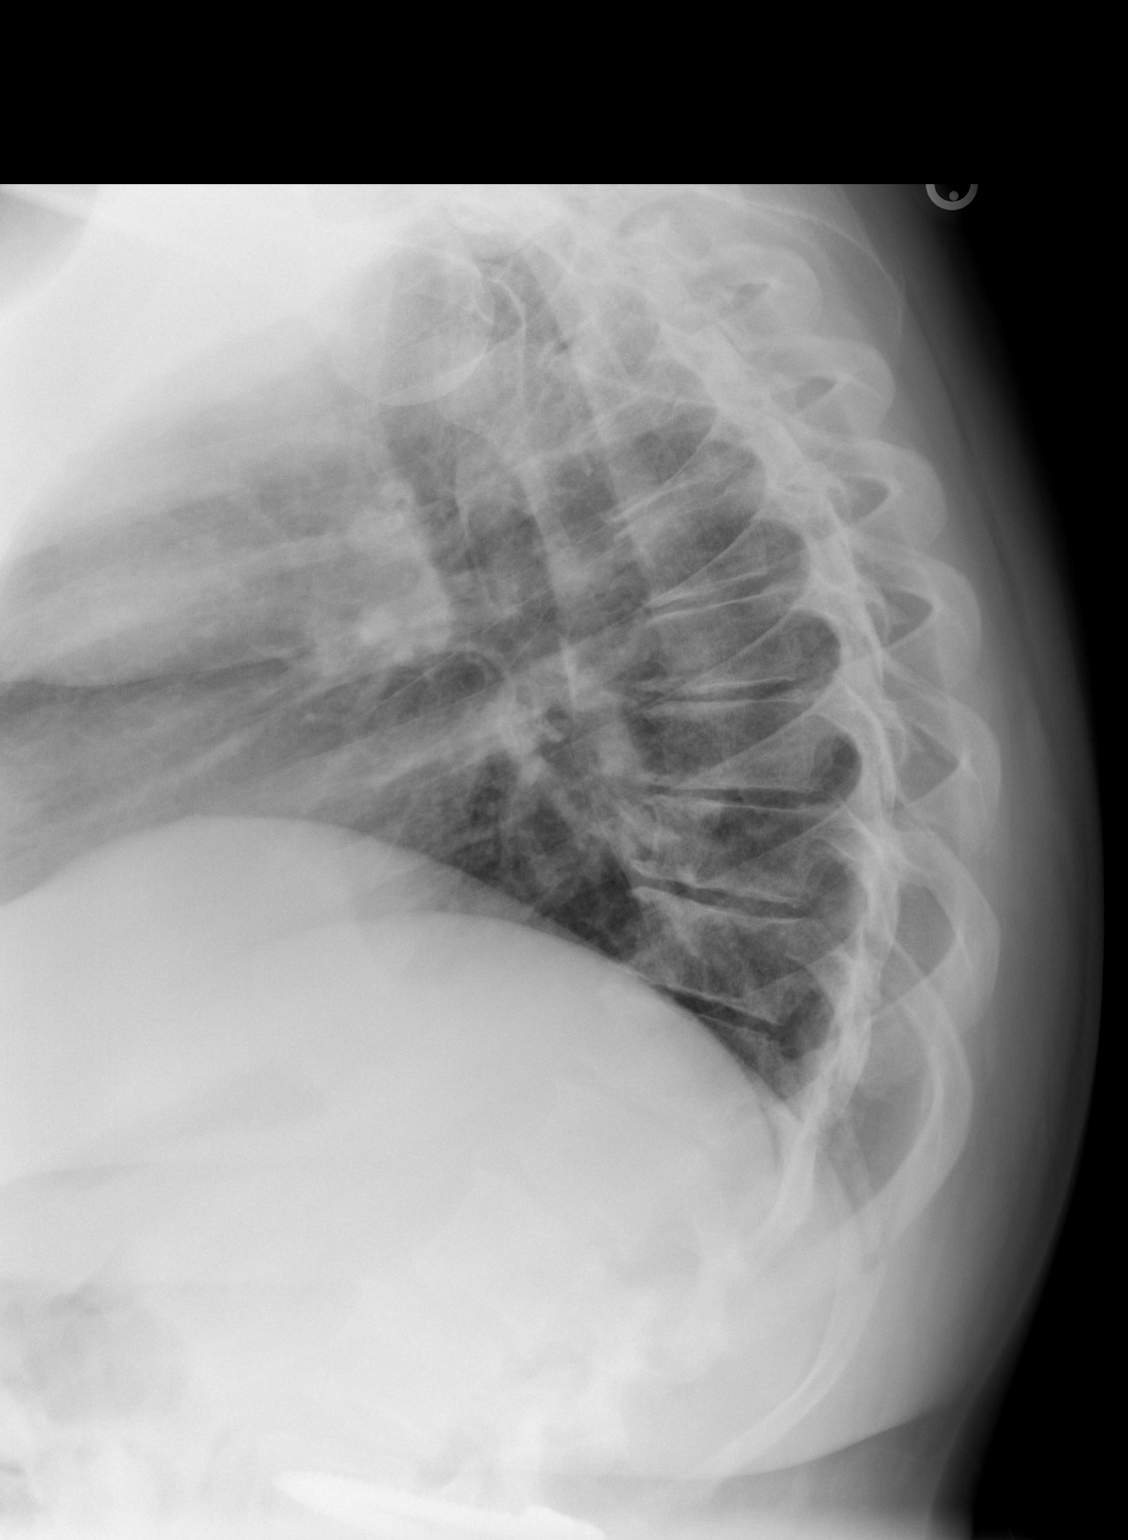

[w swimmers view *]
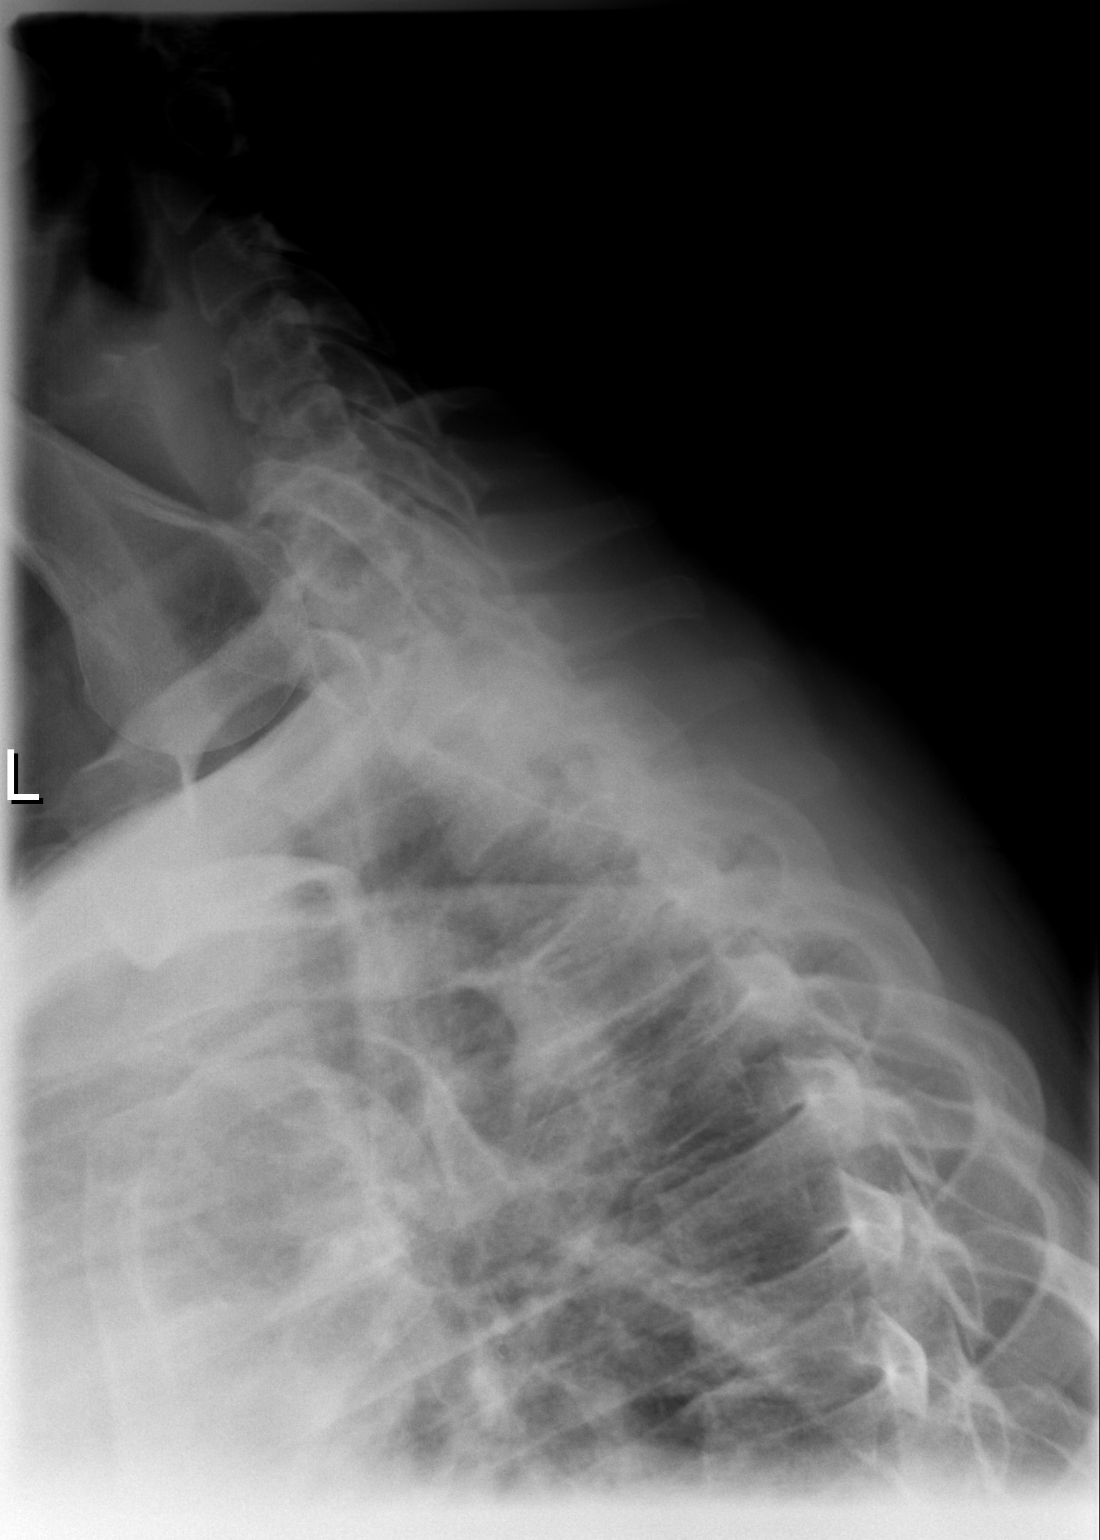

[3 of 3 positions shown; findings below may reference images not displayed]

FINDINGS: No fracture or spondylolisthesis is noted. Mild dextroscoliosis of
lower thoracic spine is noted. Osteophyte formation is noted at
multiple levels in the mid and lower thoracic spine anteriorly.
IMPRESSION: Degenerative changes as described above. No acute abnormality seen
in the thoracic spine.

## 2016-03-17 ENCOUNTER — Encounter: Payer: Self-pay | Admitting: Family

## 2016-03-17 ENCOUNTER — Ambulatory Visit (INDEPENDENT_AMBULATORY_CARE_PROVIDER_SITE_OTHER): Payer: Medicare Other | Admitting: Family

## 2016-03-17 VITALS — BP 150/100 | HR 79 | Temp 98.2°F | Ht 67.0 in | Wt 303.0 lb

## 2016-03-17 DIAGNOSIS — G8929 Other chronic pain: Secondary | ICD-10-CM

## 2016-03-17 DIAGNOSIS — R6889 Other general symptoms and signs: Secondary | ICD-10-CM | POA: Diagnosis not present

## 2016-03-17 DIAGNOSIS — M25572 Pain in left ankle and joints of left foot: Secondary | ICD-10-CM | POA: Diagnosis not present

## 2016-03-17 DIAGNOSIS — M545 Low back pain: Secondary | ICD-10-CM

## 2016-03-17 DIAGNOSIS — M25571 Pain in right ankle and joints of right foot: Secondary | ICD-10-CM | POA: Diagnosis not present

## 2016-03-17 LAB — POC INFLUENZA A&B (BINAX/QUICKVUE)
Influenza A, POC: NEGATIVE
Influenza B, POC: NEGATIVE

## 2016-03-17 MED ORDER — LISINOPRIL 40 MG PO TABS: 40.0000 mg | ORAL_TABLET | Freq: Every day | ORAL | 3 refills | Status: DC

## 2016-03-17 NOTE — Progress Notes (Signed)
Pre visit review using our clinic review tool, if applicable. No additional management support is needed unless otherwise documented below in the visit note. 

## 2016-03-17 NOTE — Assessment & Plan Note (Signed)
Chronic low back pain, hx of spinal surgery.  Will refer to new pain management due to insurance.

## 2016-03-17 NOTE — Patient Instructions (Addendum)
Please increase your lisinopril to 40mg . We will work on a referral to a new pain clinic. Continue ibuprofen as needed for fever, sore throat. Call if symptoms worsen or if not improved in 3-4 days.
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
## 2016-03-25 ENCOUNTER — Other Ambulatory Visit: Payer: Self-pay

## 2016-03-25 MED ORDER — LEVOTHYROXINE SODIUM 175 MCG PO TABS: 175.0000 ug | ORAL_TABLET | Freq: Every day | ORAL | 0 refills | Status: DC

## 2016-04-01 ENCOUNTER — Other Ambulatory Visit: Payer: Medicare Other

## 2016-04-04 ENCOUNTER — Telehealth: Payer: Self-pay | Admitting: Family

## 2016-04-04 NOTE — Telephone Encounter (Signed)
Relation to pt: self  Call back [phone]   Reason for call:  Patient requesting Blood Pressure orders and lab orders, please advise

## 2016-04-04 NOTE — Telephone Encounter (Signed)
Nurse visit is scheduled for 04/23/16. Please advise if any labs are needed.

## 2016-04-04 NOTE — Telephone Encounter (Signed)
Left message with Stanton Kidney to have pt return my call on Monday.

## 2016-04-04 NOTE — Telephone Encounter (Signed)
No lab orders needed. 

## 2016-04-07 NOTE — Telephone Encounter (Signed)
Left message for pt to return my call.

## 2016-04-17 ENCOUNTER — Other Ambulatory Visit (INDEPENDENT_AMBULATORY_CARE_PROVIDER_SITE_OTHER): Payer: Self-pay

## 2016-04-17 DIAGNOSIS — M25572 Pain in left ankle and joints of left foot: Secondary | ICD-10-CM

## 2016-04-17 DIAGNOSIS — M25571 Pain in right ankle and joints of right foot: Secondary | ICD-10-CM

## 2016-04-21 ENCOUNTER — Ambulatory Visit (INDEPENDENT_AMBULATORY_CARE_PROVIDER_SITE_OTHER): Payer: Medicare Other | Admitting: Orthopaedic Surgery

## 2016-04-21 ENCOUNTER — Other Ambulatory Visit: Payer: Self-pay | Admitting: Family

## 2016-04-21 DIAGNOSIS — M25561 Pain in right knee: Secondary | ICD-10-CM | POA: Diagnosis not present

## 2016-04-21 DIAGNOSIS — M25572 Pain in left ankle and joints of left foot: Secondary | ICD-10-CM | POA: Diagnosis not present

## 2016-04-21 DIAGNOSIS — M25571 Pain in right ankle and joints of right foot: Secondary | ICD-10-CM

## 2016-04-21 DIAGNOSIS — G8929 Other chronic pain: Secondary | ICD-10-CM

## 2016-04-21 HISTORY — DX: Other chronic pain: G89.29

## 2016-04-21 HISTORY — DX: Pain in right ankle and joints of right foot: M25.571
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
## 2016-04-23 ENCOUNTER — Ambulatory Visit (INDEPENDENT_AMBULATORY_CARE_PROVIDER_SITE_OTHER): Payer: Medicare Other | Admitting: Family Medicine

## 2016-04-23 VITALS — BP 141/86 | HR 65

## 2016-04-23 DIAGNOSIS — I10 Essential (primary) hypertension: Secondary | ICD-10-CM

## 2016-04-23 MED ORDER — AMLODIPINE BESYLATE-VALSARTAN 5-160 MG PO TABS: 1.0000 | ORAL_TABLET | Freq: Every day | ORAL | 2 refills | Status: DC

## 2016-04-23 NOTE — Progress Notes (Signed)
Pre visit review using our clinic review tool, if applicable. No additional management support is needed unless otherwise documented below in the visit note.  Patient presents in clinic for blood pressure check per OV note 03/17/16. Reviewed medications & regimen with the patient. Today's readings were: BP 149/100 P 70 & BP 141/86. Patient is asymptomatic.  Per Dr. Nani Ravens: START Amlodipine-Valsartan (Exforge) 5-160 MG once daily. Continue current regimen with all other medications. Around 3 times per week, check your blood pressure 4 times per day. Twice in the morning and twice in the evening. The readings should be at least one minute apart. Write down these values and bring them to your next appointment.  When you check your BP, make sure you have been doing something calm/relaxing 5 minutes prior to checking. Both feet should be flat on the floor and you should be sitting. Use your left arm and make sure it is in a relaxed position (on a table), and that the cuff is at the approximate level/height of your heart.  Return in 2 weeks for an office visit with PCP.  Patient was made aware of the provider's recommendations and verbalized understanding. Next appointment scheduled for 05/12/16 at 5:15 PM.

## 2016-04-23 NOTE — Patient Instructions (Addendum)
Per Dr. Nani Ravens: START Amlodipine-Valsartan (Exforge) 5-160 MG once daily. Continue current regimen with all other medications. Around 3 times per week, check your blood pressure 4 times per day. Twice in the morning and twice in the evening. The readings should be at least one minute apart. Write down these values and bring them to your next appointment.  When you check your BP, make sure you have been doing something calm/relaxing 5 minutes prior to checking. Both feet should be flat on the floor and you should be sitting. Use your left arm and make sure it is in a relaxed position (on a table), and that the cuff is at the approximate level/height of your heart.  Return in 2 weeks for an office visit with PCP.

## 2016-04-24 NOTE — Progress Notes (Signed)
Agree with above. Pt opted for combination pill.

## 2016-05-02 LAB — HM DIABETES EYE EXAM

## 2016-05-07 ENCOUNTER — Encounter: Payer: Self-pay | Admitting: Family

## 2016-05-12 ENCOUNTER — Ambulatory Visit: Payer: Medicare Other | Admitting: Family

## 2016-05-26 ENCOUNTER — Ambulatory Visit (INDEPENDENT_AMBULATORY_CARE_PROVIDER_SITE_OTHER): Payer: Self-pay | Admitting: Orthopaedic Surgery

## 2016-06-18 ENCOUNTER — Ambulatory Visit: Payer: Medicare Other | Admitting: Family

## 2016-06-20 ENCOUNTER — Ambulatory Visit: Payer: Medicare Other | Admitting: Family

## 2016-06-27 ENCOUNTER — Encounter: Payer: Self-pay | Admitting: Family

## 2016-06-27 ENCOUNTER — Ambulatory Visit (INDEPENDENT_AMBULATORY_CARE_PROVIDER_SITE_OTHER): Payer: Medicare Other | Admitting: Family

## 2016-06-27 VITALS — BP 136/84 | HR 84 | Temp 98.4°F | Resp 18 | Ht 67.0 in | Wt 330.2 lb

## 2016-06-27 DIAGNOSIS — E039 Hypothyroidism, unspecified: Secondary | ICD-10-CM | POA: Diagnosis not present

## 2016-06-27 DIAGNOSIS — M79606 Pain in leg, unspecified: Secondary | ICD-10-CM | POA: Diagnosis not present

## 2016-06-27 DIAGNOSIS — R350 Frequency of micturition: Secondary | ICD-10-CM | POA: Diagnosis not present

## 2016-06-27 DIAGNOSIS — I10 Essential (primary) hypertension: Secondary | ICD-10-CM

## 2016-06-27 LAB — POC URINALSYSI DIPSTICK (AUTOMATED)
Bilirubin, UA: NEGATIVE
Blood, UA: NEGATIVE
Glucose, UA: NEGATIVE
Ketones, UA: NEGATIVE
Leukocytes, UA: NEGATIVE
Nitrite, UA: NEGATIVE
Protein, UA: NEGATIVE
Spec Grav, UA: 1.015 (ref 1.010–1.025)
Urobilinogen, UA: 0.2 E.U./dL
pH, UA: 6 (ref 5.0–8.0)

## 2016-06-27 LAB — COMPREHENSIVE METABOLIC PANEL
ALT: 42 U/L — ABNORMAL HIGH (ref 6–29)
AST: 48 U/L — ABNORMAL HIGH (ref 10–35)
Albumin: 3.7 g/dL (ref 3.6–5.1)
Alkaline Phosphatase: 115 U/L (ref 33–130)
BUN: 9 mg/dL (ref 7–25)
CO2: 27 mmol/L (ref 20–31)
Calcium: 8.6 mg/dL (ref 8.6–10.4)
Chloride: 103 mmol/L (ref 98–110)
Creat: 0.79 mg/dL (ref 0.50–1.05)
Glucose, Bld: 79 mg/dL (ref 65–99)
Potassium: 3.8 mmol/L (ref 3.5–5.3)
Sodium: 138 mmol/L (ref 135–146)
Total Bilirubin: 0.4 mg/dL (ref 0.2–1.2)
Total Protein: 6.8 g/dL (ref 6.1–8.1)

## 2016-06-27 LAB — TSH: TSH: 2.2 mIU/L

## 2016-06-27 MED ORDER — CIPROFLOXACIN HCL 500 MG PO TABS: 500.0000 mg | ORAL_TABLET | Freq: Two times a day (BID) | ORAL | 0 refills | Status: DC

## 2016-06-27 NOTE — Patient Instructions (Addendum)
Please return tomorrow at 2:30 for lower extremity doppler in our imaging department.   Start cipro for possible urinary tract infection.
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
## 2016-06-28 ENCOUNTER — Ambulatory Visit (HOSPITAL_BASED_OUTPATIENT_CLINIC_OR_DEPARTMENT_OTHER)
Admission: RE | Admit: 2016-06-28 | Discharge: 2016-06-28 | Disposition: A | Discharge status: Home / Self Care | Payer: Medicare Other | Source: Ambulatory Visit | Attending: Family | Admitting: Family

## 2016-06-28 DIAGNOSIS — M79606 Pain in leg, unspecified: Secondary | ICD-10-CM

## 2016-06-28 DIAGNOSIS — M79604 Pain in right leg: Secondary | ICD-10-CM | POA: Insufficient documentation

## 2016-06-28 LAB — URINE CULTURE

## 2016-07-01 ENCOUNTER — Telehealth: Payer: Self-pay | Admitting: *Deleted

## 2016-07-01 DIAGNOSIS — K754 Autoimmune hepatitis: Secondary | ICD-10-CM

## 2016-07-01 NOTE — Telephone Encounter (Signed)
-----   Message from [name], NP sent at 06/30/2016  8:41 AM EDT ----- Urine culture neg for true infection, just contamination. I recommend that she stop cipro after 3 days.  If persistent left sided back pain, please let me know.  Liver function testing is mildly abnormal. Please send a copy of these results to her GI (I think she sees Dr. Benson Norway, can you confirm with patient).  Thyroid testing looks good.

## 2016-07-01 NOTE — Telephone Encounter (Signed)
Notified pt and scheduled appt for Monday 07/07/16 at 3pm. Advised pt to call if she can work out anything to come in earlier to be seen and she voices understanding.

## 2016-07-01 NOTE — Telephone Encounter (Signed)
Spoke with pt about liver enzymes. She states she has seen [name] in the past but it has been several years. Pt wants to know if she can be referred to a GI within [place] for future surveillance / treatment?  Please advise.

## 2016-07-01 NOTE — Telephone Encounter (Signed)
Notified pt of below except for the liver testing. Results have not been faxed to GI yet as I still need to confirm this with the pt. Pt states back pain is worsening and pain is in both legs but the right leg is worse and swelling is worsening as well. States pain is so bad she can hardly stand or put pressure on leg. Leg is swollen down to her foot.  Denies redness of lower extremity. States ortho contacted her to schedule an appt but she has had to put that on hold at this time due to caring for her dying mother and getting palliative care set up in their home. Advised pt she should be re-evaluated in the office and she states she is unable to do that at this time but wants to know what other suggestions PCP may have.  Please advise?

## 2016-07-01 NOTE — Telephone Encounter (Signed)
Needs OV when she can come in please. No other recs at this time.

## 2016-07-02 ENCOUNTER — Encounter: Payer: Self-pay | Admitting: Gastroenterology

## 2016-07-02 NOTE — Addendum Note (Signed)
Addended by: [name] on: 07/02/2016 10:33 AM   Modules accepted: Orders

## 2016-07-02 NOTE — Telephone Encounter (Signed)
Notified pt and she has already been scheduled with [facility] for 08/14/16.

## 2016-07-02 NOTE — Telephone Encounter (Signed)
Referral placed.

## 2016-07-07 ENCOUNTER — Ambulatory Visit (HOSPITAL_BASED_OUTPATIENT_CLINIC_OR_DEPARTMENT_OTHER)
Admission: RE | Admit: 2016-07-07 | Discharge: 2016-07-07 | Disposition: A | Discharge status: Home / Self Care | Payer: Medicare Other | Source: Ambulatory Visit | Attending: Family | Admitting: Family

## 2016-07-07 ENCOUNTER — Ambulatory Visit (INDEPENDENT_AMBULATORY_CARE_PROVIDER_SITE_OTHER): Payer: Medicare Other | Admitting: Family

## 2016-07-07 ENCOUNTER — Encounter: Payer: Self-pay | Admitting: Family

## 2016-07-07 VITALS — BP 129/88 | HR 64 | Temp 98.4°F | Resp 18 | Ht 67.0 in | Wt 327.6 lb

## 2016-07-07 DIAGNOSIS — M47894 Other spondylosis, thoracic region: Secondary | ICD-10-CM | POA: Insufficient documentation

## 2016-07-07 DIAGNOSIS — M5441 Lumbago with sciatica, right side: Secondary | ICD-10-CM

## 2016-07-07 MED ORDER — METHYLPREDNISOLONE 4 MG PO TBPK
ORAL_TABLET | ORAL | 0 refills | Status: DC
Start: 2016-07-07 — End: 2016-08-14

## 2016-07-07 NOTE — Patient Instructions (Signed)
Please complete lab work prior to leaving. Complete x rays on the first floor. Schedule follow up with Dr. Arnoldo Morale. Begin medrol dose pak. Go to ER if severe/worsening low back pain or if you develop new incontinence of stool/urine.
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
## 2016-07-08 ENCOUNTER — Encounter: Payer: Self-pay | Admitting: Family

## 2016-07-08 LAB — CBC WITH DIFFERENTIAL/PLATELET
Basophils Absolute: 0.1 10*3/uL (ref 0.0–0.1)
Basophils Relative: 1.5 % (ref 0.0–3.0)
Eosinophils Absolute: 0.3 10*3/uL (ref 0.0–0.7)
Eosinophils Relative: 5.3 % — ABNORMAL HIGH (ref 0.0–5.0)
HCT: 40.2 % (ref 36.0–46.0)
Hemoglobin: 13.6 g/dL (ref 12.0–15.0)
Lymphocytes Relative: 26.6 % (ref 12.0–46.0)
Lymphs Abs: 1.6 10*3/uL (ref 0.7–4.0)
MCHC: 33.8 g/dL (ref 30.0–36.0)
MCV: 86.1 fl (ref 78.0–100.0)
Monocytes Absolute: 0.4 10*3/uL (ref 0.1–1.0)
Monocytes Relative: 6.8 % (ref 3.0–12.0)
Neutro Abs: 3.7 10*3/uL (ref 1.4–7.7)
Neutrophils Relative %: 59.8 % (ref 43.0–77.0)
Platelets: 178 10*3/uL (ref 150.0–400.0)
RBC: 4.67 Mil/uL (ref 3.87–5.11)
RDW: 14.5 % (ref 11.5–15.5)
WBC: 6.1 10*3/uL (ref 4.0–10.5)

## 2016-07-14 ENCOUNTER — Other Ambulatory Visit: Payer: Self-pay | Admitting: Family Medicine

## 2016-07-14 ENCOUNTER — Other Ambulatory Visit: Payer: Self-pay | Admitting: Family

## 2016-07-23 ENCOUNTER — Ambulatory Visit (INDEPENDENT_AMBULATORY_CARE_PROVIDER_SITE_OTHER): Payer: Medicare Other

## 2016-07-23 ENCOUNTER — Ambulatory Visit (INDEPENDENT_AMBULATORY_CARE_PROVIDER_SITE_OTHER): Payer: Medicare Other | Admitting: Orthopaedic Surgery

## 2016-07-23 DIAGNOSIS — G8929 Other chronic pain: Secondary | ICD-10-CM

## 2016-07-23 DIAGNOSIS — M25551 Pain in right hip: Secondary | ICD-10-CM | POA: Diagnosis not present

## 2016-07-23 DIAGNOSIS — M25561 Pain in right knee: Secondary | ICD-10-CM

## 2016-07-23 MED ORDER — LIDOCAINE HCL 1 % IJ SOLN
3.0000 mL | INTRAMUSCULAR | Status: AC | PRN
  Administered 2016-07-23: 3 mL

## 2016-07-23 MED ORDER — METHYLPREDNISOLONE ACETATE 40 MG/ML IJ SUSP
40.0000 mg | INTRAMUSCULAR | Status: AC | PRN
  Administered 2016-07-23: 40 mg via INTRA_ARTICULAR
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
## 2016-08-14 ENCOUNTER — Encounter: Payer: Self-pay | Admitting: Gastroenterology

## 2016-08-14 ENCOUNTER — Encounter (INDEPENDENT_AMBULATORY_CARE_PROVIDER_SITE_OTHER): Payer: Self-pay

## 2016-08-14 ENCOUNTER — Ambulatory Visit (INDEPENDENT_AMBULATORY_CARE_PROVIDER_SITE_OTHER): Payer: Medicare Other | Admitting: Gastroenterology

## 2016-08-14 VITALS — BP 118/92 | HR 80 | Ht 67.0 in | Wt 329.4 lb

## 2016-08-14 DIAGNOSIS — R7989 Other specified abnormal findings of blood chemistry: Secondary | ICD-10-CM | POA: Diagnosis not present

## 2016-08-14 DIAGNOSIS — G8929 Other chronic pain: Secondary | ICD-10-CM

## 2016-08-14 DIAGNOSIS — K3184 Gastroparesis: Secondary | ICD-10-CM | POA: Diagnosis not present

## 2016-08-14 DIAGNOSIS — R1011 Right upper quadrant pain: Secondary | ICD-10-CM

## 2016-08-14 DIAGNOSIS — K754 Autoimmune hepatitis: Secondary | ICD-10-CM | POA: Diagnosis not present

## 2016-08-14 DIAGNOSIS — R945 Abnormal results of liver function studies: Secondary | ICD-10-CM

## 2016-08-14 NOTE — Patient Instructions (Signed)
If you are age 53 or older, your body mass index should be between 23-30. Your Body mass index is 51.59 kg/m. If this is out of the aforementioned range listed, please consider follow up with your Primary Care Provider.  If you are age 18 or younger, your body mass index should be between 19-25. Your Body mass index is 51.59 kg/m. If this is out of the aformentioned range listed, please consider follow up with your Primary Care Provider.   Thank you for choosing [facility]  Dr Wilfrid Lund III
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
## 2016-08-25 ENCOUNTER — Ambulatory Visit (INDEPENDENT_AMBULATORY_CARE_PROVIDER_SITE_OTHER): Payer: Medicare Other | Admitting: Orthopaedic Surgery

## 2016-09-16 ENCOUNTER — Other Ambulatory Visit: Payer: Self-pay | Admitting: Family

## 2016-09-17 ENCOUNTER — Telehealth: Payer: Self-pay | Admitting: Family

## 2016-09-17 MED ORDER — METOPROLOL TARTRATE 25 MG PO TABS: ORAL_TABLET | ORAL | 0 refills | Status: DC

## 2016-09-17 NOTE — Telephone Encounter (Signed)
Patient scheduled with NP for Mon 03/23/2017 at 1:15pm

## 2016-09-17 NOTE — Telephone Encounter (Signed)
Tiffany-- can you call pt back and change her appt. 6 month follow up with Lenna Sciara is due around 12/27/16 as stated below. (6 months from last OV, 06/2016) Not 6 months from now.  Thanks!

## 2016-09-17 NOTE — Telephone Encounter (Signed)
°  Relation to LN:[id number] Call back Reynolds:  [id number] - [place], [place] [phone] (Phone) [phone] (Fax)     Reason for call:  Patient requesting 90 day supply metoprolol tartrate (LOPRESSOR) 25 MG tablet

## 2016-09-17 NOTE — Telephone Encounter (Signed)
shiquita-- please call pt to schedule 6 month follow up with Melissa around 12/27/16. Thanks!

## 2016-09-17 NOTE — Telephone Encounter (Signed)
Patient last seen by [place] in June and has no future appts scheduled. Refill sent to pharmacy. When would you like to see pt in the office for follow up?

## 2016-09-17 NOTE — Telephone Encounter (Signed)
6 month follow up please

## 2016-09-18 NOTE — Telephone Encounter (Signed)
Called to change pt's apt. Pt said before she make an apt change she would like to be advised, pt says that her GI Dr. Lenon Ahmadi that they were going to have pt to have labs completed by PCP in October, not showing. Pt says that she would like to come in then if possible? OR, could this be completed at December apt?     ALSO, pt says that her dosage on her Metformin is 25 MG, pt says that she normally gets 20 MG instead, she would like to know if provider made a change?     Pt says that she also need a refill on her amLODipine    Pharmacy: [id number] - [place], [place]

## 2016-09-19 NOTE — Telephone Encounter (Signed)
Notified pt of below. She thought last metoprolol Rx was 20mg . Advised pt per below and that our record indicates we have only prescribed 25mg  dose for her. Pt voices understanding and r/s appt for 11/07/16 at 2pm.

## 2016-09-19 NOTE — Telephone Encounter (Signed)
OK to bring her back in October, GI wants her LFT' repeated then.   She is not on metformin. She is on metoprolol which does not come in 20mg  tabs.

## 2016-09-19 NOTE — Telephone Encounter (Signed)
Melissa-- please advise re: lab question and how you want pt to proceed with that then I will call back and address other concerns below. Thanks!

## 2016-10-13 ENCOUNTER — Other Ambulatory Visit: Payer: Self-pay | Admitting: Family

## 2016-10-13 NOTE — Telephone Encounter (Signed)
Rx approved and sent to the pharmacy by e-script.//AB/CMA 

## 2016-11-07 ENCOUNTER — Ambulatory Visit (INDEPENDENT_AMBULATORY_CARE_PROVIDER_SITE_OTHER): Payer: Medicare Other | Admitting: Family

## 2016-11-07 ENCOUNTER — Encounter: Payer: Self-pay | Admitting: Family

## 2016-11-07 VITALS — BP 134/74 | HR 72 | Temp 98.3°F | Ht 67.0 in | Wt 330.4 lb

## 2016-11-07 DIAGNOSIS — Z23 Encounter for immunization: Secondary | ICD-10-CM | POA: Diagnosis not present

## 2016-11-07 DIAGNOSIS — I10 Essential (primary) hypertension: Secondary | ICD-10-CM | POA: Diagnosis not present

## 2016-11-07 DIAGNOSIS — R252 Cramp and spasm: Secondary | ICD-10-CM

## 2016-11-07 DIAGNOSIS — E039 Hypothyroidism, unspecified: Secondary | ICD-10-CM

## 2016-11-07 DIAGNOSIS — G894 Chronic pain syndrome: Secondary | ICD-10-CM

## 2016-11-07 MED ORDER — METOPROLOL TARTRATE 25 MG PO TABS: ORAL_TABLET | ORAL | 1 refills | Status: DC

## 2016-11-07 MED ORDER — AMLODIPINE BESYLATE-VALSARTAN 5-160 MG PO TABS: 1.0000 | ORAL_TABLET | Freq: Every day | ORAL | 1 refills | Status: DC

## 2016-11-07 NOTE — Assessment & Plan Note (Signed)
Clinically stable, continue current dose of Synthroid, obtain follow-up TSH.
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
## 2016-11-07 NOTE — Patient Instructions (Signed)
Please complete lab work prior to leaving.   

## 2016-11-07 NOTE — Assessment & Plan Note (Signed)
Stable on current meds.  Continue same, obtain follow up bmet.

## 2016-11-07 NOTE — Assessment & Plan Note (Signed)
Stable.  Treatment per pain clinic.

## 2016-11-08 LAB — BASIC METABOLIC PANEL
BUN: 10 mg/dL (ref 7–25)
CO2: 28 mmol/L (ref 20–32)
Calcium: 8.8 mg/dL (ref 8.6–10.4)
Chloride: 100 mmol/L (ref 98–110)
Creat: 0.66 mg/dL (ref 0.50–1.05)
Glucose, Bld: 81 mg/dL (ref 65–99)
Potassium: 4.5 mmol/L (ref 3.5–5.3)
Sodium: 136 mmol/L (ref 135–146)

## 2016-11-08 LAB — TSH: TSH: 4.43 mIU/L

## 2016-11-08 LAB — MAGNESIUM: Magnesium: 1.9 mg/dL (ref 1.5–2.5)

## 2016-11-09 ENCOUNTER — Encounter: Payer: Self-pay | Admitting: Family

## 2016-12-12 ENCOUNTER — Ambulatory Visit (INDEPENDENT_AMBULATORY_CARE_PROVIDER_SITE_OTHER): Payer: Medicare Other | Admitting: Family

## 2016-12-12 ENCOUNTER — Encounter: Payer: Self-pay | Admitting: Family

## 2016-12-12 VITALS — BP 116/74 | HR 72 | Temp 98.6°F | Resp 18 | Ht 67.0 in | Wt 331.0 lb

## 2016-12-12 DIAGNOSIS — B9789 Other viral agents as the cause of diseases classified elsewhere: Secondary | ICD-10-CM | POA: Diagnosis not present

## 2016-12-12 DIAGNOSIS — J069 Acute upper respiratory infection, unspecified: Secondary | ICD-10-CM | POA: Diagnosis not present

## 2016-12-12 MED ORDER — BENZONATATE 100 MG PO CAPS: 100.0000 mg | ORAL_CAPSULE | Freq: Three times a day (TID) | ORAL | 0 refills | Status: DC | PRN
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
## 2016-12-12 NOTE — Patient Instructions (Signed)
You may use tylenol as needed for pain/fever. You may use mucinex 600 twice daily Add nasal saline spray as needed. You may use tessalon as needed for cough. Call if symptoms are not improved in 3-4 days.

## 2017-01-02 ENCOUNTER — Other Ambulatory Visit: Payer: Self-pay | Admitting: Neurosurgery

## 2017-01-02 DIAGNOSIS — M5442 Lumbago with sciatica, left side: Principal | ICD-10-CM

## 2017-01-02 DIAGNOSIS — G8929 Other chronic pain: Secondary | ICD-10-CM

## 2017-01-02 DIAGNOSIS — M5441 Lumbago with sciatica, right side: Principal | ICD-10-CM

## 2017-01-11 ENCOUNTER — Other Ambulatory Visit: Payer: Self-pay | Admitting: Family

## 2017-01-16 ENCOUNTER — Ambulatory Visit
Admission: RE | Admit: 2017-01-16 | Discharge: 2017-01-16 | Disposition: A | Discharge status: Home / Self Care | Payer: Medicare Other | Source: Ambulatory Visit | Attending: Neurosurgery | Admitting: Neurosurgery

## 2017-01-16 DIAGNOSIS — M5441 Lumbago with sciatica, right side: Principal | ICD-10-CM

## 2017-01-16 DIAGNOSIS — G8929 Other chronic pain: Secondary | ICD-10-CM

## 2017-01-16 DIAGNOSIS — M5442 Lumbago with sciatica, left side: Principal | ICD-10-CM

## 2017-01-16 MED ORDER — GADOBENATE DIMEGLUMINE 529 MG/ML IV SOLN
10.0000 mL | Freq: Once | INTRAVENOUS | Status: AC | PRN
  Administered 2017-01-16: 10 mL via INTRAVENOUS

## 2017-02-06 ENCOUNTER — Encounter: Payer: Medicare Other | Admitting: Family

## 2017-03-09 ENCOUNTER — Encounter: Payer: Medicare Other | Admitting: Family

## 2017-03-16 ENCOUNTER — Encounter: Payer: Self-pay | Admitting: Family

## 2017-03-16 ENCOUNTER — Ambulatory Visit (INDEPENDENT_AMBULATORY_CARE_PROVIDER_SITE_OTHER): Payer: Medicare Other | Admitting: Family

## 2017-03-16 ENCOUNTER — Other Ambulatory Visit (HOSPITAL_COMMUNITY)
Admission: RE | Admit: 2017-03-16 | Discharge: 2017-03-16 | Disposition: A | Discharge status: Home / Self Care | Payer: Medicare Other | Source: Ambulatory Visit | Attending: Family | Admitting: Family

## 2017-03-16 VITALS — BP 108/68 | HR 62 | Temp 98.0°F | Resp 16 | Ht 66.5 in | Wt 331.0 lb

## 2017-03-16 DIAGNOSIS — E039 Hypothyroidism, unspecified: Secondary | ICD-10-CM

## 2017-03-16 DIAGNOSIS — I10 Essential (primary) hypertension: Secondary | ICD-10-CM

## 2017-03-16 DIAGNOSIS — Z0000 Encounter for general adult medical examination without abnormal findings: Secondary | ICD-10-CM | POA: Diagnosis not present

## 2017-03-16 DIAGNOSIS — Z01419 Encounter for gynecological examination (general) (routine) without abnormal findings: Secondary | ICD-10-CM

## 2017-03-16 DIAGNOSIS — E348 Other specified endocrine disorders: Secondary | ICD-10-CM

## 2017-03-16 DIAGNOSIS — I8393 Asymptomatic varicose veins of bilateral lower extremities: Secondary | ICD-10-CM

## 2017-03-16 NOTE — Patient Instructions (Addendum)
Please complete lab work prior to leaving. Continue to work on [employer], exercise and weight loss. You will be contacted about your referral to the vascular doctor.  Schedule bone density and mammogram on the first floor.
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
## 2017-03-17 ENCOUNTER — Telehealth: Payer: Self-pay | Admitting: Family

## 2017-03-17 DIAGNOSIS — R928 Other abnormal and inconclusive findings on diagnostic imaging of breast: Secondary | ICD-10-CM

## 2017-03-17 LAB — COMPREHENSIVE METABOLIC PANEL
ALT: 27 U/L (ref 0–35)
AST: 31 U/L (ref 0–37)
Albumin: 3.7 g/dL (ref 3.5–5.2)
Alkaline Phosphatase: 92 U/L (ref 39–117)
BUN: 11 mg/dL (ref 6–23)
CO2: 33 mEq/L — ABNORMAL HIGH (ref 19–32)
Calcium: 9 mg/dL (ref 8.4–10.5)
Chloride: 101 mEq/L (ref 96–112)
Creatinine, Ser: 0.6 mg/dL (ref 0.40–1.20)
GFR: 110.73 mL/min (ref >60.00)
Glucose, Bld: 111 mg/dL — ABNORMAL HIGH (ref 70–99)
Potassium: 3.6 mEq/L (ref 3.5–5.1)
Sodium: 137 mEq/L (ref 135–145)
Total Bilirubin: 0.4 mg/dL (ref 0.2–1.2)
Total Protein: 7.2 g/dL (ref 6.0–8.3)

## 2017-03-17 LAB — TSH: TSH: 2.16 u[IU]/mL (ref 0.35–4.50)

## 2017-03-17 NOTE — Telephone Encounter (Signed)
Please advise pt that breast center wants her to return for diagnostic mammogram. I have placed order for mammogram to be done there and she should be contacted to schedule.

## 2017-03-19 ENCOUNTER — Encounter: Payer: Self-pay | Admitting: Family

## 2017-03-19 LAB — CYTOLOGY - PAP
Diagnosis: NEGATIVE
HPV: NOT DETECTED

## 2017-03-23 ENCOUNTER — Ambulatory Visit: Payer: Medicare Other | Admitting: Family

## 2017-03-23 NOTE — Telephone Encounter (Signed)
Spoke with pt and advised her that diagnostic mammograms are not done at our facility and she can get both the mammogram and DEXA done at the [facility]. Provided pt with # to call and schedule both appts. Pt inquired if referral is required.  Gwen-- can you verify if referral / authorization is required for this pt's insurance for diagnostic mammogram and DEXA?

## 2017-03-23 NOTE — Telephone Encounter (Signed)
Copied from [place] [id number]. Topic: Quick Communication - See Telephone Encounter >> Mar 23, 2017  9:09 AM Lolita Rieger, RMA wrote: CRM for notification. See Telephone encounter for:   03/23/17.pt would like a call back to discuss where she can go to have her mammogram and bone density done she stated that they would not schedule her downstairs and she is confused as to why Please contact pt @[phone] 

## 2017-03-25 ENCOUNTER — Other Ambulatory Visit: Payer: Self-pay | Admitting: Family

## 2017-03-25 DIAGNOSIS — N6489 Other specified disorders of breast: Secondary | ICD-10-CM

## 2017-04-01 DIAGNOSIS — M76829 Posterior tibial tendinitis, unspecified leg: Secondary | ICD-10-CM | POA: Insufficient documentation

## 2017-04-01 HISTORY — DX: Posterior tibial tendinitis, unspecified leg: M76.829

## 2017-04-08 ENCOUNTER — Other Ambulatory Visit: Payer: Self-pay | Admitting: Family

## 2017-04-08 DIAGNOSIS — E2839 Other primary ovarian failure: Secondary | ICD-10-CM

## 2017-04-10 ENCOUNTER — Ambulatory Visit
Admission: RE | Admit: 2017-04-10 | Discharge: 2017-04-10 | Disposition: A | Discharge status: Home / Self Care | Payer: Medicare Other | Source: Ambulatory Visit | Attending: Family | Admitting: Family

## 2017-04-10 ENCOUNTER — Ambulatory Visit: Admission: RE | Admit: 2017-04-10 | Payer: Medicare Other | Source: Ambulatory Visit

## 2017-04-10 ENCOUNTER — Other Ambulatory Visit: Payer: Medicare Other

## 2017-04-10 DIAGNOSIS — N6489 Other specified disorders of breast: Secondary | ICD-10-CM

## 2017-04-10 DIAGNOSIS — E2839 Other primary ovarian failure: Secondary | ICD-10-CM

## 2017-05-05 ENCOUNTER — Encounter: Payer: Medicare Other | Admitting: Vascular Surgery

## 2017-05-13 ENCOUNTER — Other Ambulatory Visit: Payer: Self-pay | Admitting: Family

## 2017-06-12 ENCOUNTER — Other Ambulatory Visit: Payer: Self-pay | Admitting: Family

## 2017-06-29 ENCOUNTER — Encounter: Payer: Medicare Other | Admitting: Vascular Surgery

## 2017-07-11 ENCOUNTER — Other Ambulatory Visit: Payer: Self-pay | Admitting: Family

## 2017-07-14 ENCOUNTER — Other Ambulatory Visit: Payer: Self-pay

## 2017-07-14 DIAGNOSIS — I83893 Varicose veins of bilateral lower extremities with other complications: Secondary | ICD-10-CM

## 2017-07-17 ENCOUNTER — Encounter: Payer: Medicare Other | Admitting: Vascular Surgery

## 2017-07-17 ENCOUNTER — Encounter (HOSPITAL_COMMUNITY): Payer: Medicare Other

## 2017-08-25 ENCOUNTER — Encounter: Payer: Self-pay | Admitting: Physical Therapy

## 2017-08-25 ENCOUNTER — Ambulatory Visit: Payer: Medicare Other | Attending: Physical Medicine and Rehabilitation | Admitting: Physical Therapy

## 2017-08-25 DIAGNOSIS — G8929 Other chronic pain: Secondary | ICD-10-CM

## 2017-08-25 DIAGNOSIS — M545 Low back pain, unspecified: Secondary | ICD-10-CM

## 2017-08-25 DIAGNOSIS — M25572 Pain in left ankle and joints of left foot: Secondary | ICD-10-CM | POA: Diagnosis present

## 2017-08-25 DIAGNOSIS — M25571 Pain in right ankle and joints of right foot: Secondary | ICD-10-CM

## 2017-08-25 DIAGNOSIS — R262 Difficulty in walking, not elsewhere classified: Secondary | ICD-10-CM | POA: Insufficient documentation
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
## 2017-09-01 ENCOUNTER — Ambulatory Visit: Payer: Medicare Other | Admitting: Physical Therapy

## 2017-09-08 ENCOUNTER — Ambulatory Visit: Payer: Medicare Other | Admitting: Physical Therapy

## 2017-09-10 ENCOUNTER — Ambulatory Visit (INDEPENDENT_AMBULATORY_CARE_PROVIDER_SITE_OTHER): Payer: Medicare Other | Admitting: Medical

## 2017-09-10 ENCOUNTER — Ambulatory Visit (HOSPITAL_BASED_OUTPATIENT_CLINIC_OR_DEPARTMENT_OTHER)
Admission: RE | Admit: 2017-09-10 | Discharge: 2017-09-10 | Disposition: A | Discharge status: Home / Self Care | Payer: Medicare Other | Source: Ambulatory Visit | Attending: Medical | Admitting: Medical

## 2017-09-10 ENCOUNTER — Encounter: Payer: Self-pay | Admitting: Medical

## 2017-09-10 ENCOUNTER — Ambulatory Visit: Payer: Medicare Other | Admitting: Physical Therapy

## 2017-09-10 VITALS — BP 135/45 | HR 66 | Temp 98.5°F | Resp 16 | Ht 67.0 in | Wt 331.0 lb

## 2017-09-10 DIAGNOSIS — M7989 Other specified soft tissue disorders: Secondary | ICD-10-CM | POA: Insufficient documentation

## 2017-09-10 DIAGNOSIS — M79605 Pain in left leg: Secondary | ICD-10-CM | POA: Insufficient documentation

## 2017-09-10 DIAGNOSIS — T7840XS Allergy, unspecified, sequela: Secondary | ICD-10-CM | POA: Diagnosis not present

## 2017-09-10 DIAGNOSIS — M5489 Other dorsalgia: Secondary | ICD-10-CM | POA: Diagnosis not present

## 2017-09-10 DIAGNOSIS — L089 Local infection of the skin and subcutaneous tissue, unspecified: Secondary | ICD-10-CM | POA: Diagnosis not present

## 2017-09-10 DIAGNOSIS — M79604 Pain in right leg: Secondary | ICD-10-CM | POA: Insufficient documentation

## 2017-09-10 LAB — POC URINALSYSI DIPSTICK (AUTOMATED)
Bilirubin, UA: NEGATIVE
Blood, UA: NEGATIVE
Glucose, UA: NEGATIVE
Ketones, UA: NEGATIVE
Leukocytes, UA: NEGATIVE
Nitrite, UA: NEGATIVE
Protein, UA: NEGATIVE
Spec Grav, UA: 1.01 (ref 1.010–1.025)
Urobilinogen, UA: NEGATIVE E.U./dL — AB
pH, UA: 6 (ref 5.0–8.0)

## 2017-09-10 MED ORDER — SULFAMETHOXAZOLE-TRIMETHOPRIM 800-160 MG PO TABS: 1.0000 | ORAL_TABLET | Freq: Two times a day (BID) | ORAL | 0 refills | Status: DC

## 2017-09-10 MED ORDER — PREDNISONE 10 MG PO TABS: ORAL_TABLET | ORAL | 0 refills | Status: DC

## 2017-09-10 NOTE — Patient Instructions (Addendum)
For your recent red ,tender and mildly swollen calves, I did prescribe you Bactrim DS antibiotic and also gave you 6-day taper dose of prednisone.  You describe a history of shaving your legs and then using new soap may have started off skin infections and allergic reaction.  With your family history of DVTs in numerous family members and popliteal region pain, I do think it is best to go ahead and get lower extremity ultrasounds tonight if possible.  If not tonight and tomorrow morning.  I have called down repeatedly to our radiology department and knowing his answering the phone.  So I am going to walk down with you to try to talk with someone directly in order to get you scheduled.  After discussion also decided to get CBC and a sed rate.  Follow-up in 5 days or as needed.  Did walk down to radilology with pt. Got her scheduled for [place] tonight at 8:30. Will call or my chart her the results of study when in.
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
## 2017-09-11 LAB — CBC WITH DIFFERENTIAL/PLATELET
Basophils Absolute: 0 10*3/uL (ref 0.0–0.1)
Basophils Relative: 0.5 % (ref 0.0–3.0)
Eosinophils Absolute: 0.2 10*3/uL (ref 0.0–0.7)
Eosinophils Relative: 4.4 % (ref 0.0–5.0)
HCT: 38.6 % (ref 36.0–46.0)
Hemoglobin: 12.9 g/dL (ref 12.0–15.0)
Lymphocytes Relative: 28.4 % (ref 12.0–46.0)
Lymphs Abs: 1.5 10*3/uL (ref 0.7–4.0)
MCHC: 33.6 g/dL (ref 30.0–36.0)
MCV: 85.4 fl (ref 78.0–100.0)
Monocytes Absolute: 0.4 10*3/uL (ref 0.1–1.0)
Monocytes Relative: 7.4 % (ref 3.0–12.0)
Neutro Abs: 3.2 10*3/uL (ref 1.4–7.7)
Neutrophils Relative %: 59.3 % (ref 43.0–77.0)
Platelets: 169 10*3/uL (ref 150.0–400.0)
RBC: 4.52 Mil/uL (ref 3.87–5.11)
RDW: 14.5 % (ref 11.5–15.5)
WBC: 5.5 10*3/uL (ref 4.0–10.5)

## 2017-09-11 LAB — SEDIMENTATION RATE: Sed Rate: 20 mm/hr (ref 0–30)

## 2017-09-17 ENCOUNTER — Ambulatory Visit: Payer: Medicare Other | Admitting: Physical Therapy

## 2017-09-21 ENCOUNTER — Ambulatory Visit (INDEPENDENT_AMBULATORY_CARE_PROVIDER_SITE_OTHER): Payer: Medicare Other | Admitting: Family

## 2017-09-21 ENCOUNTER — Ambulatory Visit: Payer: Medicare Other | Admitting: Family

## 2017-09-21 ENCOUNTER — Encounter: Payer: Self-pay | Admitting: Family

## 2017-09-21 VITALS — BP 110/63 | HR 68 | Temp 98.7°F | Resp 16 | Ht 67.0 in | Wt 331.0 lb

## 2017-09-21 DIAGNOSIS — I10 Essential (primary) hypertension: Secondary | ICD-10-CM | POA: Diagnosis not present

## 2017-09-21 DIAGNOSIS — G894 Chronic pain syndrome: Secondary | ICD-10-CM | POA: Diagnosis not present

## 2017-09-21 DIAGNOSIS — Z23 Encounter for immunization: Secondary | ICD-10-CM

## 2017-09-21 DIAGNOSIS — E039 Hypothyroidism, unspecified: Secondary | ICD-10-CM

## 2017-09-21 DIAGNOSIS — R600 Localized edema: Secondary | ICD-10-CM

## 2017-09-21 NOTE — Patient Instructions (Signed)
Please complete lab work prior to leaving. You should be contacted about scheduling your echocardiogram.   
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
## 2017-09-22 ENCOUNTER — Encounter: Payer: Self-pay | Admitting: Family

## 2017-09-22 ENCOUNTER — Encounter: Payer: Self-pay | Admitting: Physical Therapy

## 2017-09-22 ENCOUNTER — Ambulatory Visit: Payer: Medicare Other | Attending: Physical Medicine and Rehabilitation | Admitting: Physical Therapy

## 2017-09-22 DIAGNOSIS — M545 Low back pain, unspecified: Secondary | ICD-10-CM

## 2017-09-22 DIAGNOSIS — M25572 Pain in left ankle and joints of left foot: Secondary | ICD-10-CM | POA: Diagnosis present

## 2017-09-22 DIAGNOSIS — G8929 Other chronic pain: Secondary | ICD-10-CM

## 2017-09-22 DIAGNOSIS — R262 Difficulty in walking, not elsewhere classified: Secondary | ICD-10-CM

## 2017-09-22 DIAGNOSIS — M25571 Pain in right ankle and joints of right foot: Secondary | ICD-10-CM

## 2017-09-22 LAB — BASIC METABOLIC PANEL
BUN: 7 mg/dL (ref 6–23)
CO2: 26 mEq/L (ref 19–32)
Calcium: 8.8 mg/dL (ref 8.4–10.5)
Chloride: 102 mEq/L (ref 96–112)
Creatinine, Ser: 0.52 mg/dL (ref 0.40–1.20)
GFR: 130.36 mL/min (ref >60.00)
Glucose, Bld: 109 mg/dL — ABNORMAL HIGH (ref 70–99)
Potassium: 3.9 mEq/L (ref 3.5–5.1)
Sodium: 137 mEq/L (ref 135–145)

## 2017-09-22 LAB — TSH: TSH: 0.87 u[IU]/mL (ref 0.35–4.50)
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
## 2017-09-24 ENCOUNTER — Encounter (HOSPITAL_COMMUNITY): Payer: Medicare Other

## 2017-09-24 ENCOUNTER — Encounter: Payer: Medicare Other | Admitting: Vascular Surgery

## 2017-09-30 ENCOUNTER — Ambulatory Visit (HOSPITAL_BASED_OUTPATIENT_CLINIC_OR_DEPARTMENT_OTHER)
Admission: RE | Admit: 2017-09-30 | Discharge: 2017-09-30 | Disposition: A | Discharge status: Home / Self Care | Payer: Medicare Other | Source: Ambulatory Visit | Attending: Family | Admitting: Family

## 2017-09-30 DIAGNOSIS — R600 Localized edema: Secondary | ICD-10-CM | POA: Insufficient documentation

## 2017-09-30 DIAGNOSIS — J449 Chronic obstructive pulmonary disease, unspecified: Secondary | ICD-10-CM | POA: Diagnosis not present

## 2017-09-30 DIAGNOSIS — I10 Essential (primary) hypertension: Secondary | ICD-10-CM | POA: Diagnosis not present

## 2017-09-30 NOTE — Progress Notes (Signed)
  Echocardiogram 2D Echocardiogram has been performed.  [name] [name] 09/30/2017, 3:59 PM

## 2017-10-01 ENCOUNTER — Ambulatory Visit: Payer: Medicare Other | Admitting: Physical Therapy

## 2017-10-06 ENCOUNTER — Encounter: Payer: Self-pay | Admitting: Physical Therapy

## 2017-10-06 ENCOUNTER — Ambulatory Visit: Payer: Medicare Other | Admitting: Physical Therapy

## 2017-10-06 DIAGNOSIS — M545 Low back pain, unspecified: Secondary | ICD-10-CM

## 2017-10-06 DIAGNOSIS — R262 Difficulty in walking, not elsewhere classified: Secondary | ICD-10-CM

## 2017-10-06 DIAGNOSIS — M25572 Pain in left ankle and joints of left foot: Secondary | ICD-10-CM

## 2017-10-06 DIAGNOSIS — G8929 Other chronic pain: Secondary | ICD-10-CM

## 2017-10-06 DIAGNOSIS — M25571 Pain in right ankle and joints of right foot: Secondary | ICD-10-CM
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
## 2017-10-08 ENCOUNTER — Ambulatory Visit: Payer: Medicare Other | Admitting: Physical Therapy

## 2017-10-08 ENCOUNTER — Encounter: Payer: Self-pay | Admitting: Physical Therapy

## 2017-10-08 DIAGNOSIS — M25571 Pain in right ankle and joints of right foot: Secondary | ICD-10-CM

## 2017-10-08 DIAGNOSIS — G8929 Other chronic pain: Secondary | ICD-10-CM

## 2017-10-08 DIAGNOSIS — M545 Low back pain: Secondary | ICD-10-CM | POA: Diagnosis not present

## 2017-10-08 DIAGNOSIS — M25572 Pain in left ankle and joints of left foot: Secondary | ICD-10-CM

## 2017-10-08 DIAGNOSIS — R262 Difficulty in walking, not elsewhere classified: Secondary | ICD-10-CM
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
## 2017-10-20 ENCOUNTER — Ambulatory Visit: Payer: Medicare Other | Attending: Physical Medicine and Rehabilitation | Admitting: Physical Therapy

## 2017-10-20 ENCOUNTER — Encounter: Payer: Self-pay | Admitting: Physical Therapy

## 2017-10-20 DIAGNOSIS — R262 Difficulty in walking, not elsewhere classified: Secondary | ICD-10-CM | POA: Diagnosis present

## 2017-10-20 DIAGNOSIS — M25572 Pain in left ankle and joints of left foot: Secondary | ICD-10-CM | POA: Insufficient documentation

## 2017-10-20 DIAGNOSIS — G8929 Other chronic pain: Secondary | ICD-10-CM

## 2017-10-20 DIAGNOSIS — M545 Low back pain: Secondary | ICD-10-CM | POA: Diagnosis not present

## 2017-10-20 DIAGNOSIS — M25571 Pain in right ankle and joints of right foot: Secondary | ICD-10-CM | POA: Diagnosis present

## 2017-10-20 DIAGNOSIS — R600 Localized edema: Secondary | ICD-10-CM | POA: Insufficient documentation
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
## 2017-11-06 ENCOUNTER — Other Ambulatory Visit: Payer: Self-pay | Admitting: Family

## 2017-11-10 ENCOUNTER — Ambulatory Visit (HOSPITAL_COMMUNITY)
Admission: RE | Admit: 2017-11-10 | Discharge: 2017-11-10 | Disposition: A | Discharge status: Home / Self Care | Payer: Medicare Other | Source: Ambulatory Visit | Attending: Vascular Surgery | Admitting: Vascular Surgery

## 2017-11-10 ENCOUNTER — Encounter: Payer: Self-pay | Admitting: Vascular Surgery

## 2017-11-10 ENCOUNTER — Ambulatory Visit (INDEPENDENT_AMBULATORY_CARE_PROVIDER_SITE_OTHER): Payer: Medicare Other | Admitting: Vascular Surgery

## 2017-11-10 ENCOUNTER — Other Ambulatory Visit: Payer: Self-pay

## 2017-11-10 DIAGNOSIS — I872 Venous insufficiency (chronic) (peripheral): Secondary | ICD-10-CM

## 2017-11-10 DIAGNOSIS — I83893 Varicose veins of bilateral lower extremities with other complications: Secondary | ICD-10-CM | POA: Diagnosis not present

## 2017-11-10 HISTORY — DX: Venous insufficiency (chronic) (peripheral): I87.2
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
## 2017-11-20 ENCOUNTER — Encounter: Payer: Self-pay | Admitting: Family Medicine

## 2017-11-20 ENCOUNTER — Ambulatory Visit (INDEPENDENT_AMBULATORY_CARE_PROVIDER_SITE_OTHER): Payer: Medicare Other | Admitting: Family Medicine

## 2017-11-20 VITALS — BP 130/84 | HR 60 | Temp 98.4°F | Ht 67.0 in

## 2017-11-20 DIAGNOSIS — J40 Bronchitis, not specified as acute or chronic: Secondary | ICD-10-CM | POA: Diagnosis not present

## 2017-11-20 MED ORDER — PREDNISONE 20 MG PO TABS: 40.0000 mg | ORAL_TABLET | Freq: Every day | ORAL | 0 refills | Status: AC

## 2017-11-20 MED ORDER — ALBUTEROL SULFATE 108 (90 BASE) MCG/ACT IN AEPB: 1.0000 | INHALATION_SPRAY | Freq: Four times a day (QID) | RESPIRATORY_TRACT | 0 refills | Status: DC | PRN

## 2017-11-20 NOTE — Patient Instructions (Addendum)
Continue to push fluids, practice good hand hygiene, and cover your mouth if you cough.  If you start having fevers, shaking or shortness of breath, seek immediate care.  For symptoms, consider using Vick's VapoRub on chest or under nose, air humidifier, Benadryl at night, and elevating the head of the bed. Tylenol and ibuprofen for aches and pains you may be experiencing.   Let us know if you need anything.  
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
## 2017-11-20 NOTE — Progress Notes (Signed)
Pre visit review using our clinic review tool, if applicable. No additional management support is needed unless otherwise documented below in the visit note. 

## 2017-12-03 ENCOUNTER — Other Ambulatory Visit: Payer: Self-pay | Admitting: Family

## 2017-12-08 ENCOUNTER — Other Ambulatory Visit: Payer: Self-pay | Admitting: Family

## 2018-02-18 ENCOUNTER — Ambulatory Visit: Payer: Medicare Other | Admitting: Vascular Surgery

## 2018-03-20 ENCOUNTER — Other Ambulatory Visit: Payer: Self-pay | Admitting: Family Medicine

## 2018-03-20 DIAGNOSIS — J40 Bronchitis, not specified as acute or chronic: Secondary | ICD-10-CM

## 2018-03-23 ENCOUNTER — Encounter: Payer: Self-pay | Admitting: Family

## 2018-03-23 ENCOUNTER — Ambulatory Visit (INDEPENDENT_AMBULATORY_CARE_PROVIDER_SITE_OTHER): Payer: Medicare Other | Admitting: Family

## 2018-03-23 VITALS — BP 125/80 | HR 63 | Temp 98.3°F | Resp 16

## 2018-03-23 DIAGNOSIS — R52 Pain, unspecified: Secondary | ICD-10-CM

## 2018-03-23 DIAGNOSIS — K754 Autoimmune hepatitis: Secondary | ICD-10-CM | POA: Diagnosis not present

## 2018-03-23 DIAGNOSIS — H00012 Hordeolum externum right lower eyelid: Secondary | ICD-10-CM

## 2018-03-23 DIAGNOSIS — R5383 Other fatigue: Secondary | ICD-10-CM | POA: Diagnosis not present

## 2018-03-23 DIAGNOSIS — B9789 Other viral agents as the cause of diseases classified elsewhere: Secondary | ICD-10-CM | POA: Diagnosis not present

## 2018-03-23 DIAGNOSIS — J069 Acute upper respiratory infection, unspecified: Secondary | ICD-10-CM

## 2018-03-23 LAB — POC INFLUENZA A&B (BINAX/QUICKVUE)
Influenza A, POC: NEGATIVE
Influenza B, POC: NEGATIVE

## 2018-03-23 MED ORDER — CEPHALEXIN 500 MG PO CAPS: 500.0000 mg | ORAL_CAPSULE | Freq: Two times a day (BID) | ORAL | 0 refills | Status: DC

## 2018-03-23 NOTE — Patient Instructions (Signed)
Please begin keflex twice daily for your eye- apply warm compress twice daily. Complete lab work prior to leaving. Call if cough congestion worsens or if not much better by Saturday.

## 2018-03-23 NOTE — Progress Notes (Signed)
   Subjective:    Patient ID: Megan [name], female    DOB: [birth year], 53 y.o.   MRN: [id number]  HPI   Reports that she she developed a blocked tear duct in the right eye.    Reports that she had 3 days of keflex on hand.  Also had some antibiotic ointment.  Reports that right eye was swollen and draining.  Reports significant improvement since that time.   Cough- started 2 days ago. Had associated body aches, fatigue.  Also noted some wheezing last night.   Reports "Liver is feeling funny." requests that she wants [place] to check her thyroid and her liver tests.      Review of Systems     Objective:   Physical Exam Constitutional:      Appearance: She is well-developed.  HENT:     Right Ear: Tympanic membrane and ear canal normal.     Left Ear: Ear canal normal.     Ears:     Comments: Mild pink streak at top of left TM     Mouth/Throat:     Pharynx: No oropharyngeal exudate or posterior oropharyngeal erythema.  Eyes:     Comments: + stye bottom inner lid with some surrounding swelling  Neck:     Musculoskeletal: Neck supple.     Thyroid: No thyromegaly.  Cardiovascular:     Rate and Rhythm: Normal rate and regular rhythm.     Heart sounds: Normal heart sounds. No murmur.  Pulmonary:     Effort: Pulmonary effort is normal. No respiratory distress.     Breath sounds: Normal breath sounds. No wheezing.  Skin:    General: Skin is warm and dry.  Neurological:     Mental Status: She is alert and oriented to person, place, and time.  Psychiatric:        Behavior: Behavior normal.        Thought Content: Thought content normal.        Judgment: Judgment normal.           Assessment & Plan:  Viral URI with cough- pt is advised to call if new/worsening symptoms or if not improved in 4 days.  Continue supportive measures.  Stye- rx with keflex, advised warm compresses bid and follow up with her eye doctor.   Fatigue- check tsh.  Hx of autoimmune hepatitis- check  follow up lft.

## 2018-03-24 LAB — HEPATIC FUNCTION PANEL
ALT: 35 U/L (ref 0–35)
AST: 41 U/L — ABNORMAL HIGH (ref 0–37)
Albumin: 3.8 g/dL (ref 3.5–5.2)
Alkaline Phosphatase: 123 U/L — ABNORMAL HIGH (ref 39–117)
Bilirubin, Direct: 0.1 mg/dL (ref 0.0–0.3)
Total Bilirubin: 0.4 mg/dL (ref 0.2–1.2)
Total Protein: 7.3 g/dL (ref 6.0–8.3)

## 2018-03-24 LAB — TSH: TSH: 2.1 u[IU]/mL (ref 0.35–4.50)

## 2018-04-28 ENCOUNTER — Ambulatory Visit (INDEPENDENT_AMBULATORY_CARE_PROVIDER_SITE_OTHER): Payer: Medicare Other | Admitting: Family

## 2018-04-28 DIAGNOSIS — N3000 Acute cystitis without hematuria: Secondary | ICD-10-CM | POA: Diagnosis not present

## 2018-04-28 MED ORDER — SULFAMETHOXAZOLE-TRIMETHOPRIM 400-80 MG PO TABS: 1.0000 | ORAL_TABLET | Freq: Two times a day (BID) | ORAL | 0 refills | Status: DC

## 2018-04-28 NOTE — Progress Notes (Signed)
Virtual Visit via Video Note  I connected with@ on 04/28/18 at  1:00 PM EDT by a video enabled telemedicine application and verified that I am speaking with the correct person using two identifiers. This visit type was conducted due to national recommendations for restrictions regarding the COVID-19 Pandemic (e.g. social distancing).  This format is felt to be most appropriate for this patient at this time.   I discussed the limitations of evaluation and management by telemedicine and the availability of in person appointments. The patient expressed understanding and agreed to proceed.  Only the patient and myself were on today's video visit. The patient was at home and I was in my office at the time of today's visit.   History of Present Illness:  Patient is a 53 yr old female who presents today with chief complaint of left sided pain low back painand foul urinary odor. Odor is worst in the AM. Symptoms have been present for several days.    Reports pain in the mid pelvic area on the left. Reports that at the end of urination she has some suprapubic discomfort. Feels similar to previous UTI's.   Denies associated hematuria or fever.   Observations/Objective:  Gen: Awake, alert, no acute distress Resp: Breathing is even and non-labored Psych: calm/pleasant demeanor Neuro: Alert and Oriented x 3, + facial symmetry, speech is clear.   Assessment and Plan:  UTI- symptoms most consistent with UTI. Will begin empiric bactrim. She is advised to call if new/worsening symptoms or if symptoms are not improved in 2-3 days. Pt verbalizes understanding.  Follow Up Instructions:    I discussed the assessment and treatment plan with the patient. The patient was provided an opportunity to ask questions and all were answered. The patient agreed with the plan and demonstrated an understanding of the instructions.   The patient was advised to call back or seek an in-person evaluation if the symptoms  worsen or if the condition fails to improve as anticipated.    Nance Pear, NP

## 2018-04-29 ENCOUNTER — Other Ambulatory Visit: Payer: Self-pay | Admitting: Family

## 2018-05-04 DIAGNOSIS — M5416 Radiculopathy, lumbar region: Secondary | ICD-10-CM | POA: Insufficient documentation

## 2018-05-04 HISTORY — DX: Radiculopathy, lumbar region: M54.16

## 2018-05-31 ENCOUNTER — Other Ambulatory Visit: Payer: Self-pay | Admitting: Family

## 2018-06-04 ENCOUNTER — Other Ambulatory Visit: Payer: Self-pay | Admitting: Family

## 2018-07-21 ENCOUNTER — Other Ambulatory Visit (HOSPITAL_COMMUNITY): Payer: Self-pay | Admitting: Surgery

## 2018-07-21 ENCOUNTER — Other Ambulatory Visit: Payer: Self-pay | Admitting: Surgery

## 2018-07-25 ENCOUNTER — Telehealth: Payer: Self-pay | Admitting: Family

## 2018-07-25 NOTE — Telephone Encounter (Signed)
Opened in error

## 2018-07-25 NOTE — Telephone Encounter (Signed)
Received letter from Bariatric office requesting that we begin 6 months of medically supervised weight loss.  Please schedule patient for her first visit- ok to do a virtual for this. Pt just needs to weigh herself the day of the visit.

## 2018-07-26 NOTE — Telephone Encounter (Signed)
Patient was scheduled for tomorrow at 5 pm

## 2018-07-27 ENCOUNTER — Ambulatory Visit (INDEPENDENT_AMBULATORY_CARE_PROVIDER_SITE_OTHER): Payer: Medicare Other | Admitting: Family

## 2018-07-27 ENCOUNTER — Other Ambulatory Visit: Payer: Self-pay

## 2018-07-27 NOTE — Progress Notes (Signed)
Virtual Visit via Telephone Note  I connected with Megan [name] on 07/27/18 at  5:00 PM EDT by a video enabled telemedicine application and verified that I am speaking with the correct person using two identifiers.  Location: Patient: home Provider: office    I discussed the limitations of evaluation and management by telemedicine and the availability of in person appointments. The patient expressed understanding and agreed to proceed.  History of Present Illness:  Patient presents today to begin her first medically supervised weight loss visit. She has tried multiple diets and has failed to lose a significant amount of weight.    She describes current diet as below:  Breakfast  Oatmeal/little sugar/little milk (quaker oats) 1/2 cup oatmeal, tablespoon of sugar  Green tea or water (all day long).   Reports that she eats smaller meals due to her gastroparesis.  She will eat yogurt or fresh fruit (canteloupe/watermelon if she has it on hand).  Eats a late lunch.  [place]/cheese olive oil mayo and mustard on wheat bread or tuna salad with 1/3 of a large can with 1.5 tablespoons mayo, sweet dill  Occasional southwest chicken salad for dinner from [employer] without dressing.   Gave up chips.   Will sometimes bake some steak fries with ketchup (frozen [place] with skin on).    Sips on ice and green tea all day long.  (brews her own)  Switches to water at night. Wants to go to splenda then decaf   (sometimes yogurt) light and fit at night she plans to switch to greek yogurt.  Previously was eating ice cream.  Exercise-  Reports that she has difficulty walking in the AM due to her back pain/ankle pain.  After about 1 hour the pain eases up.  Notes that if she sits in one position she has pain. Reports she does some walking but this is limited by her ankle pain.   Reports that she gave up gingerale, but is still having some sweetened green tea.  Trying to transition to splenda. Stopped  ice cream last week.   Eats [place], chicken and   Reports last weight   Wt Readings from Last 3 Encounters:  09/21/17 (!) 331 lb (150.1 kg)  09/10/17 (!) 331 lb (150.1 kg)  03/16/17 (!) 331 lb (150.1 kg)      Observations/Objective:   Gen: Awake, alert, no acute distress Resp: Breathing is even and non-labored Psych: calm/pleasant demeanor Neuro: Alert and Oriented x 3, speech is clear.   Assessment and Plan:  Morbid obesity- we discussed removing sugar from her diet. Can substitute splenda as needed. I think she is getting the majority of her calories from her homemade sweet tea (drinks 1/2 gallon a day).  Try to increase exercise.  Follow up in 1 month.   Follow Up Instructions:  25 minutes spent on today's telephone call.    I discussed the assessment and treatment plan with the patient. The patient was provided an opportunity to ask questions and all were answered. The patient agreed with the plan and demonstrated an understanding of the instructions.   The patient was advised to call back or seek an in-person evaluation if the symptoms worsen or if the condition fails to improve as anticipated.  Nance Pear, NP

## 2018-08-03 ENCOUNTER — Other Ambulatory Visit (HOSPITAL_COMMUNITY): Payer: Self-pay | Admitting: Surgery

## 2018-08-05 ENCOUNTER — Other Ambulatory Visit: Payer: Self-pay

## 2018-08-05 ENCOUNTER — Ambulatory Visit (HOSPITAL_COMMUNITY)
Admission: RE | Admit: 2018-08-05 | Discharge: 2018-08-05 | Disposition: A | Discharge status: Home / Self Care | Payer: Medicare Other | Source: Ambulatory Visit | Attending: Surgery | Admitting: Surgery

## 2018-08-29 ENCOUNTER — Telehealth: Payer: Self-pay | Admitting: Family

## 2018-08-29 NOTE — Telephone Encounter (Signed)
Left voicemail for pt advising her that we need to reschedule her appointment for tomorrow as I will be out of the office.   Sherri, can you please call pt ot reschedule appointment?  Virtual is OK. tks.

## 2018-08-30 ENCOUNTER — Ambulatory Visit: Payer: Medicare Other | Admitting: Family

## 2018-08-31 ENCOUNTER — Other Ambulatory Visit: Payer: Self-pay

## 2018-08-31 ENCOUNTER — Ambulatory Visit (INDEPENDENT_AMBULATORY_CARE_PROVIDER_SITE_OTHER): Payer: Medicare Other | Admitting: Family

## 2018-08-31 NOTE — Progress Notes (Signed)
Virtual Visit via Telephone Note  I connected with Megan [name] on 08/31/18 at  2:40 PM EDT by a video enabled telemedicine application and verified that I am speaking with the correct person using two identifiers.  Location: Patient: home Provider: home   I discussed the limitations of evaluation and management by telemedicine and the availability of in person appointments. The patient expressed understanding and agreed to proceed.  History of Present Illness:  Patient is a 53 yr old female who presents today for her second monthly supervised weight loss meeting. She does not have a home scale.   All splenda this month in her iced tea. No longer adding sugar.  Does not have an accurate weight. Reports that her clothing is fitting better- not as snug. She is trying to eat more grilled chicken.   Has switched to fiber one cereal when she eats cereal for breakfast.  Reports that she still eats oatmeal most mornings. Stopped chips/ice cream. She reports that she ate one pancake this AM.    She reports that she only eats two meals a day generally.   Snacks- watermelon, fresh fruit when she can afford it, yogurt. Still eating oatmeal cookies.  (3-4 a day)  No formal exercise.  Did play basketball with her daughter last week.     Observations/Objective:  Gen: Awake, alert, no acute distress Resp: Breathing sounds even and non-labored Psych: calm/pleasant demeanor Neuro: Alert and Oriented x 3, speech is clear.   Assessment and Plan:  Morbid obesity- commended pt on her dietary changes thus far.   Encouraged her to walk [place] the [place] in [place] every day.  Try to cut out the oatmeal cookies.   Follow Up Instructions:    I discussed the assessment and treatment plan with the patient. The patient was provided an opportunity to ask questions and all were answered. The patient agreed with the plan and demonstrated an understanding of the instructions.   The patient  was advised to call back or seek an in-person evaluation if the symptoms worsen or if the condition fails to improve as anticipated.  I provided 19 minutes of non-face-to-face time during this encounter.   Nance Pear, NP

## 2018-08-31 NOTE — Patient Instructions (Signed)
Try to walk some every day. Try to cut out the oatmeal cookies.

## 2018-09-02 ENCOUNTER — Ambulatory Visit: Payer: Medicare Other | Admitting: Dietician

## 2018-09-30 ENCOUNTER — Ambulatory Visit: Payer: Medicare Other | Admitting: Dietician

## 2018-10-01 ENCOUNTER — Ambulatory Visit: Payer: Medicare Other | Admitting: Family

## 2018-10-12 ENCOUNTER — Ambulatory Visit (INDEPENDENT_AMBULATORY_CARE_PROVIDER_SITE_OTHER): Payer: Medicare Other | Admitting: Family

## 2018-10-12 ENCOUNTER — Encounter: Payer: Self-pay | Admitting: Family

## 2018-10-12 ENCOUNTER — Other Ambulatory Visit: Payer: Self-pay

## 2018-10-12 DIAGNOSIS — Z23 Encounter for immunization: Secondary | ICD-10-CM

## 2018-10-12 NOTE — Patient Instructions (Addendum)
Please work on cutting out the oatmeal cookies.   Try to walk a bit further every day.
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
## 2018-10-13 ENCOUNTER — Encounter: Payer: Self-pay | Admitting: Family

## 2018-10-24 ENCOUNTER — Other Ambulatory Visit: Payer: Self-pay | Admitting: Family

## 2018-11-04 ENCOUNTER — Other Ambulatory Visit: Payer: Self-pay | Admitting: Family

## 2018-11-08 ENCOUNTER — Other Ambulatory Visit: Payer: Self-pay

## 2018-11-09 ENCOUNTER — Ambulatory Visit (INDEPENDENT_AMBULATORY_CARE_PROVIDER_SITE_OTHER): Payer: Medicare Other | Admitting: Family

## 2018-11-09 ENCOUNTER — Encounter: Payer: Self-pay | Admitting: Family

## 2018-11-09 VITALS — BP 129/80 | HR 66 | Temp 97.7°F | Resp 16 | Ht 66.0 in | Wt 322.0 lb

## 2018-11-09 DIAGNOSIS — E039 Hypothyroidism, unspecified: Secondary | ICD-10-CM | POA: Diagnosis not present

## 2018-11-09 DIAGNOSIS — F4321 Adjustment disorder with depressed mood: Secondary | ICD-10-CM

## 2018-11-09 DIAGNOSIS — I10 Essential (primary) hypertension: Secondary | ICD-10-CM
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
## 2018-11-10 ENCOUNTER — Telehealth: Payer: Self-pay | Admitting: Family

## 2018-11-10 ENCOUNTER — Ambulatory Visit: Payer: Medicare Other | Admitting: Dietician

## 2018-11-10 LAB — COMPREHENSIVE METABOLIC PANEL
ALT: 26 U/L (ref 0–35)
AST: 33 U/L (ref 0–37)
Albumin: 3.8 g/dL (ref 3.5–5.2)
Alkaline Phosphatase: 99 U/L (ref 39–117)
BUN: 10 mg/dL (ref 6–23)
CO2: 33 mEq/L — ABNORMAL HIGH (ref 19–32)
Calcium: 8.9 mg/dL (ref 8.4–10.5)
Chloride: 101 mEq/L (ref 96–112)
Creatinine, Ser: 0.58 mg/dL (ref 0.40–1.20)
GFR: 107.68 mL/min (ref >60.00)
Glucose, Bld: 113 mg/dL — ABNORMAL HIGH (ref 70–99)
Potassium: 4.4 mEq/L (ref 3.5–5.1)
Sodium: 138 mEq/L (ref 135–145)
Total Bilirubin: 0.4 mg/dL (ref 0.2–1.2)
Total Protein: 7.4 g/dL (ref 6.0–8.3)

## 2018-11-12 LAB — TSH: TSH: 0.95 u[IU]/mL (ref 0.35–4.50)

## 2018-11-12 NOTE — Telephone Encounter (Signed)
Opened in error

## 2018-11-13 ENCOUNTER — Telehealth: Payer: Self-pay | Admitting: Family

## 2018-11-13 MED ORDER — SYNTHROID 175 MCG PO TABS: ORAL_TABLET | ORAL | 1 refills | Status: DC

## 2018-11-13 NOTE — Telephone Encounter (Signed)
Please advise pt that thyroid testing looks good. Refill of synthroid has been sent to her pharmacy.

## 2018-11-15 NOTE — Telephone Encounter (Signed)
Advised patient of results and to refills

## 2018-12-13 ENCOUNTER — Other Ambulatory Visit: Payer: Self-pay

## 2018-12-14 ENCOUNTER — Ambulatory Visit (INDEPENDENT_AMBULATORY_CARE_PROVIDER_SITE_OTHER): Payer: Medicare Other | Admitting: Family

## 2018-12-14 ENCOUNTER — Encounter: Payer: Self-pay | Admitting: Family

## 2018-12-14 VITALS — BP 137/89 | HR 62 | Temp 96.9°F | Resp 16 | Ht 66.0 in | Wt 318.0 lb

## 2018-12-14 DIAGNOSIS — F4321 Adjustment disorder with depressed mood: Secondary | ICD-10-CM

## 2018-12-14 NOTE — Patient Instructions (Addendum)
Please work on eliminating pasta from your diet.
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
## 2018-12-16 ENCOUNTER — Ambulatory Visit: Payer: Medicare Other | Admitting: Psychology

## 2018-12-23 ENCOUNTER — Ambulatory Visit (INDEPENDENT_AMBULATORY_CARE_PROVIDER_SITE_OTHER): Payer: Medicare Other | Admitting: Psychology

## 2018-12-23 DIAGNOSIS — F509 Eating disorder, unspecified: Secondary | ICD-10-CM

## 2018-12-28 ENCOUNTER — Ambulatory Visit: Payer: Medicare Other | Admitting: Psychology

## 2019-01-04 ENCOUNTER — Ambulatory Visit (INDEPENDENT_AMBULATORY_CARE_PROVIDER_SITE_OTHER): Payer: Medicare Other | Admitting: Psychology

## 2019-01-21 ENCOUNTER — Ambulatory Visit: Payer: Medicare Other | Admitting: Family

## 2019-01-24 ENCOUNTER — Other Ambulatory Visit: Payer: Self-pay

## 2019-01-25 ENCOUNTER — Other Ambulatory Visit: Payer: Self-pay

## 2019-01-25 ENCOUNTER — Ambulatory Visit (INDEPENDENT_AMBULATORY_CARE_PROVIDER_SITE_OTHER): Payer: Medicare Other | Admitting: Family

## 2019-01-25 ENCOUNTER — Encounter: Payer: Self-pay | Admitting: Family

## 2019-01-25 VITALS — BP 130/81 | HR 64 | Temp 97.3°F | Resp 16 | Ht 66.0 in | Wt 313.2 lb

## 2019-01-25 DIAGNOSIS — R0789 Other chest pain: Secondary | ICD-10-CM

## 2019-01-25 DIAGNOSIS — F4321 Adjustment disorder with depressed mood: Secondary | ICD-10-CM | POA: Diagnosis not present
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
## 2019-01-26 ENCOUNTER — Encounter: Payer: Medicare Other | Attending: Surgery | Admitting: Dietician

## 2019-01-26 ENCOUNTER — Encounter: Payer: Self-pay | Admitting: Dietician

## 2019-01-26 DIAGNOSIS — Z713 Dietary counseling and surveillance: Secondary | ICD-10-CM | POA: Insufficient documentation

## 2019-01-26 DIAGNOSIS — Z6841 Body Mass Index (BMI) 40.0 and over, adult: Secondary | ICD-10-CM | POA: Insufficient documentation

## 2019-01-26 DIAGNOSIS — E079 Disorder of thyroid, unspecified: Secondary | ICD-10-CM | POA: Diagnosis not present

## 2019-01-26 DIAGNOSIS — N951 Menopausal and female climacteric states: Secondary | ICD-10-CM | POA: Insufficient documentation

## 2019-01-26 DIAGNOSIS — N840 Polyp of corpus uteri: Secondary | ICD-10-CM

## 2019-01-26 DIAGNOSIS — I10 Essential (primary) hypertension: Secondary | ICD-10-CM | POA: Diagnosis not present

## 2019-01-26 DIAGNOSIS — E669 Obesity, unspecified: Secondary | ICD-10-CM

## 2019-01-26 DIAGNOSIS — K8020 Calculus of gallbladder without cholecystitis without obstruction: Secondary | ICD-10-CM | POA: Diagnosis not present

## 2019-01-26 HISTORY — DX: Polyp of corpus uteri: N84.0

## 2019-01-26 NOTE — Progress Notes (Signed)
Nutrition Assessment for Bariatric Surgery Medical Nutrition Therapy  Appt Start Time: 2:00pm    End Time: 3:00pm  Patient was seen on 01/26/2019 for Pre-Operative Nutrition Assessment. Letter of approval faxed to [facility] bariatric surgery program coordinator on 01/26/2019.   Referral stated Supervised Weight Loss (SWL) visits needed: 6 (*to be completed with PCP)  Planned surgery: Sleeve Gastrectomy  Pt expectation of surgery: to correct gastroparesis, joint pain, other comorbidities, to come off medications, and to live longer and be better able to care for grandchildren  Pt expectation of dietitian: to help find appropriate supplements and give guidance for finding non-meat protein sources   NUTRITION ASSESSMENT   Anthropometrics  Start weight at [facility]: 313 lbs (date: 01/26/2019) Height: 66 in BMI: 50.5 kg/m2     Lifestyle & Dietary Hx States her brother had bariatric surgery. Lives with and cares for her grandchildren.  Typical meal pattern is 2 small meals plus 1-2 snacks per day. Rarely eats lunch. Common foods include yogurt and oatmeal cookies. Prefers [place] and chicken vs red meat. May have taco soup, meatloaf, or a [place] sandwich with cheese and mayo for meals. Drinks lots of water (~0.5 gallon per day) and green tea (~1 gallon per day.)   24-Hr Dietary Recall First Meal: oatmeal + Splenda + cinnamon + milk  Snack: oatmeal cookie + yogurt  Second Meal: - Snack: Cheese Nips Third Meal: parmesan crusted chicken + green peas + mashed potatoes  Snack: yogurt Beverages: green tea w/ Splenda, water  Estimated Energy Needs Calories: 1500-1600 Carbohydrate: 170-180g Protein: 94-100g Fat: 50-53g   NUTRITION DIAGNOSIS  Overweight/obesity ([place]-3.3) related to past poor dietary habits and physical inactivity as evidenced by patient w/ planned Sleeve Gastrectomy surgery following dietary guidelines for continued weight loss.    NUTRITION INTERVENTION  Nutrition  counseling (C-1) and education (E-2) to facilitate bariatric surgery goals.  Pre-Op Goals Reviewed with the Patient . Track food and beverage intake (pen and paper, MyFitness Pal, Baritastic app, etc.) . Make healthy food choices while monitoring portion sizes . Consume 3 meals per day or try to eat every 3-5 hours . Avoid concentrated sugars and fried foods . Keep sugar & fat in the single digits per serving on food labels . Practice CHEWING your food (aim for applesauce consistency) . Practice not drinking 15 minutes before, during, and 30 minutes after each meal and snack . Avoid all carbonated beverages (ex: soda, sparkling beverages)  . Limit caffeinated beverages (ex: coffee, tea, energy drinks) . Avoid all sugar-sweetened beverages (ex: regular soda, sports drinks)  . Avoid alcohol  . Aim for 64-100 ounces of FLUID daily (with at least half of fluid intake being plain water)  . Aim for at least 60-80 grams of PROTEIN daily . Look for a liquid protein source that contains ?15 g protein and ?5 g carbohydrate (ex: shakes, drinks, shots) . Make a list of non-food related activities . Physical activity is an important part of a healthy lifestyle so keep it moving! The goal is to reach 150 minutes of exercise per week, including cardiovascular and weight baring activity.  Handouts Provided Include  . Bariatric Surgery handouts (Nutrition Visits, Pre-Op Goals, Protein Shakes, Vitamins & Minerals)  Learning Style & Readiness for Change Teaching method utilized: Visual & Auditory  Demonstrated degree of understanding via: Teach Back    MONITORING & EVALUATION Dietary intake, weekly physical activity, body weight, and pre-op goals reached at next nutrition visit.   Next Steps Patient is  to follow up at [facility] for Pre-Op Class (>2 weeks before surgery) for further nutrition education.
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
## 2019-01-28 ENCOUNTER — Encounter: Payer: Self-pay | Admitting: Cardiology

## 2019-01-28 ENCOUNTER — Ambulatory Visit (INDEPENDENT_AMBULATORY_CARE_PROVIDER_SITE_OTHER): Payer: Medicare Other | Admitting: Cardiology

## 2019-01-28 ENCOUNTER — Other Ambulatory Visit: Payer: Self-pay

## 2019-01-28 VITALS — BP 126/84 | HR 63 | Ht 66.0 in | Wt 313.0 lb

## 2019-01-28 DIAGNOSIS — I119 Hypertensive heart disease without heart failure: Secondary | ICD-10-CM

## 2019-01-28 DIAGNOSIS — R002 Palpitations: Secondary | ICD-10-CM | POA: Diagnosis not present

## 2019-01-28 DIAGNOSIS — R079 Chest pain, unspecified: Secondary | ICD-10-CM

## 2019-01-28 DIAGNOSIS — Z01810 Encounter for preprocedural cardiovascular examination: Secondary | ICD-10-CM

## 2019-01-28 DIAGNOSIS — I471 Supraventricular tachycardia: Secondary | ICD-10-CM | POA: Diagnosis not present

## 2019-01-28 NOTE — Patient Instructions (Addendum)
Medication Instructions:  none *If you need a refill on your cardiac medications before your next appointment, please call your pharmacy*  Lab Work:  If you have labs (blood work) drawn today and your tests are completely normal, you will receive your results only by: Marland Kitchen MyChart Message (if you have MyChart) OR . A paper copy in the mail If you have any lab test that is abnormal or we need to change your treatment, we will call you to review the results.  Testing/Procedures: Myoview:Lexiscan Patient request to be called Monday to schedule. Your physician has requested that you have a lexiscan myoview. For further information please visit [email or web address]. Please follow instruction sheet, as given.   Follow-Up: At [facility], you and your health needs are our priority.  As part of our continuing mission to provide you with exceptional heart care, we have created designated Provider Care Teams.  These Care Teams include your primary Cardiologist (physician) and Advanced Practice Providers (APPs -  Physician Assistants and Nurse Practitioners) who all work together to provide you with the care you need, when you need it.  Your next appointment:   4 week(s)  The format for your next appointment:   Either In Person or Virtual  Provider:   Shirlee More, MD  Other Instructions  Stay Well

## 2019-02-07 ENCOUNTER — Telehealth (HOSPITAL_COMMUNITY): Payer: Self-pay | Admitting: *Deleted

## 2019-02-07 NOTE — Telephone Encounter (Signed)
Patient given detailed instructions per Myocardial Perfusion Study Information Sheet for the test on 02/14/19 at 1:15. Patient notified to arrive 15 minutes early and that it is imperative to arrive on time for appointment to keep from having the test rescheduled.  If you need to cancel or reschedule your appointment, please call the office within 24 hours of your appointment. . Patient verbalized understanding.Megan [name]

## 2019-02-14 ENCOUNTER — Ambulatory Visit (HOSPITAL_COMMUNITY): Payer: Medicare Other

## 2019-02-17 ENCOUNTER — Inpatient Hospital Stay (HOSPITAL_COMMUNITY): Admission: RE | Admit: 2019-02-17 | Payer: Medicare Other | Source: Ambulatory Visit

## 2019-02-18 ENCOUNTER — Ambulatory Visit (HOSPITAL_COMMUNITY): Payer: Medicare Other | Attending: Cardiology

## 2019-02-18 ENCOUNTER — Other Ambulatory Visit: Payer: Self-pay

## 2019-02-18 DIAGNOSIS — I471 Supraventricular tachycardia: Secondary | ICD-10-CM | POA: Diagnosis not present

## 2019-02-18 DIAGNOSIS — Z01810 Encounter for preprocedural cardiovascular examination: Secondary | ICD-10-CM | POA: Insufficient documentation

## 2019-02-18 DIAGNOSIS — I119 Hypertensive heart disease without heart failure: Secondary | ICD-10-CM | POA: Diagnosis not present

## 2019-02-18 DIAGNOSIS — R002 Palpitations: Secondary | ICD-10-CM | POA: Diagnosis not present

## 2019-02-18 DIAGNOSIS — R079 Chest pain, unspecified: Secondary | ICD-10-CM

## 2019-02-18 MED ORDER — REGADENOSON 0.4 MG/5ML IV SOLN
0.4000 mg | Freq: Once | INTRAVENOUS | Status: AC
  Administered 2019-02-18: 0.4 mg via INTRAVENOUS

## 2019-02-18 MED ORDER — TECHNETIUM TC 99M TETROFOSMIN IV KIT
32.5000 | PACK | Freq: Once | INTRAVENOUS | Status: AC | PRN
  Administered 2019-02-18: 32.5 via INTRAVENOUS
  Filled 2019-02-18: qty 33

## 2019-02-23 ENCOUNTER — Ambulatory Visit (HOSPITAL_COMMUNITY): Payer: Medicare Other | Attending: Cardiology

## 2019-02-23 ENCOUNTER — Other Ambulatory Visit: Payer: Self-pay

## 2019-02-23 LAB — MYOCARDIAL PERFUSION IMAGING
LV dias vol: 131 mL (ref 46–106)
LV sys vol: 55 mL
Peak HR: 82 {beats}/min
Rest HR: 62 {beats}/min
SDS: 1
SRS: 2
SSS: 2
TID: 0.9

## 2019-02-23 MED ORDER — TECHNETIUM TC 99M TETROFOSMIN IV KIT
31.4000 | PACK | Freq: Once | INTRAVENOUS | Status: AC | PRN
  Administered 2019-02-23: 31.4 via INTRAVENOUS
  Filled 2019-02-23: qty 32

## 2019-02-25 ENCOUNTER — Ambulatory Visit (INDEPENDENT_AMBULATORY_CARE_PROVIDER_SITE_OTHER): Payer: Medicare Other | Admitting: Cardiology

## 2019-02-25 ENCOUNTER — Encounter: Payer: Self-pay | Admitting: Cardiology

## 2019-02-25 ENCOUNTER — Other Ambulatory Visit: Payer: Self-pay

## 2019-02-25 VITALS — BP 136/90 | HR 64 | Temp 97.5°F | Ht 66.0 in | Wt 313.0 lb

## 2019-02-25 DIAGNOSIS — R079 Chest pain, unspecified: Secondary | ICD-10-CM | POA: Diagnosis not present

## 2019-02-25 DIAGNOSIS — Z01810 Encounter for preprocedural cardiovascular examination: Secondary | ICD-10-CM | POA: Diagnosis not present

## 2019-02-25 DIAGNOSIS — I119 Hypertensive heart disease without heart failure: Secondary | ICD-10-CM
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
## 2019-02-25 NOTE — Patient Instructions (Addendum)
Medication Instructions:  Your physician recommends that you continue on your current medications as directed. Please refer to the Current Medication list given to you today.  *If you need a refill on your cardiac medications before your next appointment, please call your pharmacy*  Lab Work: None ordered  If you have labs (blood work) drawn today and your tests are completely normal, you will receive your results only by: Marland Kitchen MyChart Message (if you have MyChart) OR . A paper copy in the mail If you have any lab test that is abnormal or we need to change your treatment, we will call you to review the results.  Testing/Procedures: None ordered  Follow-Up: At [facility], you and your health needs are our priority.  As part of our continuing mission to provide you with exceptional heart care, we have created designated Provider Care Teams.  These Care Teams include your primary Cardiologist (physician) and Advanced Practice Providers (APPs -  Physician Assistants and Nurse Practitioners) who all work together to provide you with the care you need, when you need it.  Your next appointment:   AS NEEDED

## 2019-02-28 DIAGNOSIS — M961 Postlaminectomy syndrome, not elsewhere classified: Secondary | ICD-10-CM | POA: Diagnosis not present

## 2019-02-28 DIAGNOSIS — G894 Chronic pain syndrome: Secondary | ICD-10-CM | POA: Diagnosis not present

## 2019-02-28 DIAGNOSIS — M47816 Spondylosis without myelopathy or radiculopathy, lumbar region: Secondary | ICD-10-CM | POA: Diagnosis not present

## 2019-02-28 DIAGNOSIS — M47812 Spondylosis without myelopathy or radiculopathy, cervical region: Secondary | ICD-10-CM | POA: Diagnosis not present

## 2019-03-28 ENCOUNTER — Encounter: Payer: Medicare Other | Attending: Surgery | Admitting: Skilled Nursing Facility1

## 2019-03-28 ENCOUNTER — Other Ambulatory Visit: Payer: Self-pay

## 2019-03-28 DIAGNOSIS — Z833 Family history of diabetes mellitus: Secondary | ICD-10-CM | POA: Diagnosis not present

## 2019-03-28 DIAGNOSIS — Z8349 Family history of other endocrine, nutritional and metabolic diseases: Secondary | ICD-10-CM | POA: Diagnosis not present

## 2019-03-28 DIAGNOSIS — Z823 Family history of stroke: Secondary | ICD-10-CM | POA: Insufficient documentation

## 2019-03-28 DIAGNOSIS — Z7982 Long term (current) use of aspirin: Secondary | ICD-10-CM | POA: Insufficient documentation

## 2019-03-28 DIAGNOSIS — Z885 Allergy status to narcotic agent status: Secondary | ICD-10-CM | POA: Insufficient documentation

## 2019-03-28 DIAGNOSIS — Z880 Allergy status to penicillin: Secondary | ICD-10-CM | POA: Insufficient documentation

## 2019-03-28 DIAGNOSIS — E079 Disorder of thyroid, unspecified: Secondary | ICD-10-CM | POA: Insufficient documentation

## 2019-03-28 DIAGNOSIS — Z8249 Family history of ischemic heart disease and other diseases of the circulatory system: Secondary | ICD-10-CM | POA: Insufficient documentation

## 2019-03-28 DIAGNOSIS — Z79899 Other long term (current) drug therapy: Secondary | ICD-10-CM | POA: Insufficient documentation

## 2019-03-28 DIAGNOSIS — E669 Obesity, unspecified: Secondary | ICD-10-CM

## 2019-03-28 DIAGNOSIS — Z6841 Body Mass Index (BMI) 40.0 and over, adult: Secondary | ICD-10-CM | POA: Diagnosis not present

## 2019-03-28 DIAGNOSIS — Z79890 Hormone replacement therapy (postmenopausal): Secondary | ICD-10-CM | POA: Diagnosis not present

## 2019-03-28 DIAGNOSIS — F17210 Nicotine dependence, cigarettes, uncomplicated: Secondary | ICD-10-CM | POA: Insufficient documentation

## 2019-03-28 DIAGNOSIS — I10 Essential (primary) hypertension: Secondary | ICD-10-CM | POA: Diagnosis not present

## 2019-03-28 DIAGNOSIS — Z713 Dietary counseling and surveillance: Secondary | ICD-10-CM | POA: Insufficient documentation

## 2019-03-28 NOTE — Progress Notes (Signed)
Pre-Operative Nutrition Class:  Appt start time: 3979   End time:  1830.  Patient was seen on 03/28/2019 for Pre-Operative Bariatric Surgery Education at [facility].   Surgery date:  Surgery type: sleeve Start weight at [facility]: 313 Weight today: 312.7  Samples given per MNT protocol. Patient educated on appropriate usage: Bariatric Advantage Multivitamin Lot #[id number] Exp:08/21  Bariatric Advantage Calcium  Lot #[id number] Exp:01/16/2020  Protein Shake Lot #[id number] Exp:05/30/2020  The following the learning objectives were met by the patient during this course:  Identify Pre-Op Dietary Goals and will begin 2 weeks pre-operatively  Identify appropriate sources of fluids and proteins   State protein recommendations and appropriate sources pre and post-operatively  Identify Post-Operative Dietary Goals and will follow for 2 weeks post-operatively  Identify appropriate multivitamin and calcium sources  Describe the need for physical activity post-operatively and will follow MD recommendations  State when to call healthcare provider regarding medication questions or post-operative complications  Handouts given during class include:  Pre-Op Bariatric Surgery Diet Handout  Protein Shake Handout  Post-Op Bariatric Surgery Nutrition Handout  BELT Program Information Flyer  Support Group Information Flyer  [facility] Bariatric Supplements Price List  Follow-Up Plan: Patient will follow-up at [facility] 2 weeks post operatively for diet advancement per MD.

## 2019-04-04 DIAGNOSIS — M961 Postlaminectomy syndrome, not elsewhere classified: Secondary | ICD-10-CM | POA: Diagnosis not present

## 2019-04-04 DIAGNOSIS — G894 Chronic pain syndrome: Secondary | ICD-10-CM | POA: Diagnosis not present

## 2019-04-04 DIAGNOSIS — M47816 Spondylosis without myelopathy or radiculopathy, lumbar region: Secondary | ICD-10-CM | POA: Diagnosis not present

## 2019-04-04 DIAGNOSIS — M47812 Spondylosis without myelopathy or radiculopathy, cervical region: Secondary | ICD-10-CM | POA: Diagnosis not present

## 2019-04-04 NOTE — Patient Instructions (Addendum)
DUE TO COVID-19 ONLY ONE VISITOR IS ALLOWED TO COME WITH YOU AND STAY IN THE WAITING ROOM ONLY DURING PRE OP AND PROCEDURE DAY OF SURGERY. THE 1 VISITOR MAY VISIT WITH YOU AFTER SURGERY IN YOUR PRIVATE ROOM DURING VISITING HOURS ONLY!  YOU NEED TO HAVE A COVID 19 TEST ON 04-07-19@ 2:50 PM, THIS TEST MUST BE DONE BEFORE SURGERY, COME  [place] [address], [place] , [address].  ([place]) ONCE YOUR COVID TEST IS COMPLETED, PLEASE BEGIN THE QUARANTINE INSTRUCTIONS AS OUTLINED IN YOUR HANDOUT.                Megan [name] [facility]  04/04/2019   Your procedure is scheduled on: 04-11-19    Report to [facility] Main  Entrance    Report to Admitting at 5:30 AM     Call this number if you have problems the morning of surgery [phone]    Remember: Do not eat food or drink liquids :After Midnight.     Take these medicines the morning of surgery with A SIP OF WATER: Metoprolol Tartrate, Synthroid, and Oxy IR, prn   BRUSH YOUR TEETH MORNING OF SURGERY AND RINSE YOUR MOUTH OUT, NO CHEWING GUM CANDY OR MINTS.                                  You may not have any metal on your body including hair pins and              piercings    Do not wear jewelry, make-up, lotions, powders or perfumes, deodorant             Do not wear nail polish on your fingernails.  Do not shave  48 hours prior to surgery.                 Do not bring valuables to the hospital. [place].  Contacts, dentures or bridgework may not be worn into surgery.  You may bring an overnight bag.   Special Instructions: N/A              Please read over the following fact sheets you were given: _____________________________________________________________________             [facility] - Preparing for Surgery Before surgery, you can play an important role.  Because skin is not sterile, your skin needs to be as free of germs as possible.  You can reduce  the number of germs on your skin by washing with CHG (chlorahexidine gluconate) soap before surgery.  CHG is an antiseptic cleaner which kills germs and bonds with the skin to continue killing germs even after washing. Please DO NOT use if you have an allergy to CHG or antibacterial soaps.  If your skin becomes reddened/irritated stop using the CHG and inform your nurse when you arrive at Short Stay. Do not shave (including legs and underarms) for at least 48 hours prior to the first CHG shower.  You may shave your face/neck. Please follow these instructions carefully:  1.  Shower with CHG Soap the night before surgery and the  morning of Surgery.  2.  If you choose to wash your hair, wash your hair first as usual with your  normal  shampoo.  3.  After you shampoo,  rinse your hair and body thoroughly to remove the  shampoo.                           4.  Use CHG as you would any other liquid soap.  You can apply chg directly  to the skin and wash                       Gently with a scrungie or clean washcloth.  5.  Apply the CHG Soap to your body ONLY FROM THE NECK DOWN.   Do not use on face/ open                           Wound or open sores. Avoid contact with eyes, ears mouth and genitals (private parts).                       Wash face,  Genitals (private parts) with your normal soap.             6.  Wash thoroughly, paying special attention to the area where your surgery  will be performed.  7.  Thoroughly rinse your body with warm water from the neck down.  8.  DO NOT shower/wash with your normal soap after using and rinsing off  the CHG Soap.                9.  Pat yourself dry with a clean towel.            10.  Wear clean pajamas.            11.  Place clean sheets on your bed the night of your first shower and do not  sleep with pets. Day of Surgery : Do not apply any lotions/deodorants the morning of surgery.  Please wear clean clothes to the hospital/surgery center.  FAILURE TO FOLLOW  THESE INSTRUCTIONS MAY RESULT IN THE CANCELLATION OF YOUR SURGERY PATIENT SIGNATURE_________________________________  NURSE SIGNATURE__________________________________  ________________________________________________________________________

## 2019-04-04 NOTE — Progress Notes (Signed)
PCP - [name], NP Cardiologist -   Chest x-ray - 08-05-18  EKG - 01-25-19  Stress Test - 02/23/19 w/ cardiac clearance in cardiologist  note date 02-25-19  Note  ECHO - 10-01-17   Cardiac Cath -   Sleep Study -  CPAP -   Fasting Blood Sugar -  Checks Blood Sugar _____ times a day  Blood Thinner Instructions: Aspirin Instructions: 81 mg Last Dose:  Anesthesia review:   Patient denies shortness of breath, fever, cough and chest pain at PAT appointment   Patient verbalized understanding of instructions that were given to them at the PAT appointment. Patient was also instructed that they will need to review over the PAT instructions again at home before surgery.

## 2019-04-04 NOTE — Progress Notes (Signed)
Please place surgery orders. Pt is scheduled for PAT appointment on 04-05-19

## 2019-04-05 ENCOUNTER — Encounter (HOSPITAL_COMMUNITY): Payer: Self-pay

## 2019-04-05 ENCOUNTER — Encounter (HOSPITAL_COMMUNITY)
Admission: RE | Admit: 2019-04-05 | Discharge: 2019-04-05 | Disposition: A | Discharge status: Home / Self Care | Payer: Medicare Other | Source: Ambulatory Visit | Attending: Surgery | Admitting: Surgery

## 2019-04-05 ENCOUNTER — Other Ambulatory Visit: Payer: Self-pay

## 2019-04-05 DIAGNOSIS — Z79899 Other long term (current) drug therapy: Secondary | ICD-10-CM | POA: Insufficient documentation

## 2019-04-05 DIAGNOSIS — E039 Hypothyroidism, unspecified: Secondary | ICD-10-CM | POA: Diagnosis not present

## 2019-04-05 DIAGNOSIS — Z7982 Long term (current) use of aspirin: Secondary | ICD-10-CM | POA: Diagnosis not present

## 2019-04-05 DIAGNOSIS — Z87891 Personal history of nicotine dependence: Secondary | ICD-10-CM | POA: Diagnosis not present

## 2019-04-05 DIAGNOSIS — Z01812 Encounter for preprocedural laboratory examination: Secondary | ICD-10-CM | POA: Insufficient documentation

## 2019-04-05 DIAGNOSIS — I10 Essential (primary) hypertension: Secondary | ICD-10-CM | POA: Insufficient documentation

## 2019-04-05 DIAGNOSIS — Z79890 Hormone replacement therapy (postmenopausal): Secondary | ICD-10-CM | POA: Insufficient documentation

## 2019-04-05 LAB — CBC
HCT: 36.4 % (ref 36.0–46.0)
Hemoglobin: 11.5 g/dL — ABNORMAL LOW (ref 12.0–15.0)
MCH: 28.8 pg (ref 26.0–34.0)
MCHC: 31.6 g/dL (ref 30.0–36.0)
MCV: 91 fL (ref 80.0–100.0)
Platelets: 179 10*3/uL (ref 150–400)
RBC: 4 MIL/uL (ref 3.87–5.11)
RDW: 14.8 % (ref 11.5–15.5)
WBC: 5.8 10*3/uL (ref 4.0–10.5)
nRBC: 0 % (ref 0.0–0.2)

## 2019-04-05 LAB — BASIC METABOLIC PANEL
Anion gap: 7 (ref 5–15)
BUN: 14 mg/dL (ref 6–20)
CO2: 30 mmol/L (ref 22–32)
Calcium: 8.9 mg/dL (ref 8.9–10.3)
Chloride: 102 mmol/L (ref 98–111)
Creatinine, Ser: 0.64 mg/dL (ref 0.44–1.00)
GFR calc Af Amer: >60 mL/min (ref >60)
GFR calc non Af Amer: >60 mL/min (ref >60)
Glucose, Bld: 94 mg/dL (ref 70–99)
Potassium: 4.4 mmol/L (ref 3.5–5.1)
Sodium: 139 mmol/L (ref 135–145)

## 2019-04-07 ENCOUNTER — Other Ambulatory Visit (HOSPITAL_COMMUNITY)
Admission: RE | Admit: 2019-04-07 | Discharge: 2019-04-07 | Disposition: A | Discharge status: Home / Self Care | Payer: Medicare Other | Source: Ambulatory Visit | Attending: Surgery | Admitting: Surgery

## 2019-04-07 DIAGNOSIS — Z01812 Encounter for preprocedural laboratory examination: Secondary | ICD-10-CM | POA: Insufficient documentation

## 2019-04-07 DIAGNOSIS — Z20822 Contact with and (suspected) exposure to covid-19: Secondary | ICD-10-CM | POA: Diagnosis not present

## 2019-04-07 LAB — SARS CORONAVIRUS 2 (TAT 6-24 HRS): SARS Coronavirus 2: NEGATIVE

## 2019-04-08 NOTE — H&P (Signed)
Megan [name] Megan [name] Documented: 07/09/2018 3:01 PM Location: Megan [name] Patient #: [id number] DOB: 29-May-1965 Divorced / Language: Megan [name] / Race: White Female   History of Present Illness Megan Key B. Hassell Done MD; 07/09/2018 4:06 PM) The patient is a 54 year old female who presents for a bariatric [name] evaluation. 54 year old WF with lifelong hx of obesity and family obesity. 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 Arnoldo Morale and has chronic pain. She has autoimmune hepatitis. She has gastroparesis and is followed by Dr. Benson Norway. She has hypertension but no DM; no DVT. She has had an open chole; a c section and laparoscopy. She wants a sleeve like her brother who has [name] great (he has passed the [employer]) As such she is very knowledgeable about bariatric [name] and sleeve gastrectomy specifically. She denies GER but has had gastroparesis and constipation.   Past Surgical History Megan [name], [place]; 07/09/2018 3:01 PM) Cesarean Section - 1  Gallbladder [name] - Open  Oral [name]  Spinal [name] - Lower Back   Diagnostic Studies History Megan [name], CMA; 07/09/2018 3:01 PM) Colonoscopy  1-5 years ago Mammogram  1-3 years ago Pap Smear  1-5 years ago  Allergies Megan [name], [place]; 07/09/2018 3:03 PM) Penicillins  CODEINE   Medication History (Megan [name], CMA; 07/09/2018 3:04 PM) Synthroid (175MCG Tablet, Oral) Active. Metoprolol Tartrate (25MG  Tablet, Oral) Active. amLODIPine Besylate-Valsartan (5-160MG  Tablet, Oral) Active. oxyCODONE HCl (5MG  Tablet, Oral) Active. ProAir RespiClick ([id number] (90 Base)MCG/ACT Aero Pow Br Act, Inhalation) Active. Aspirin (81MG  Tablet, Oral) Active. Medications Reconciled  Social History Megan [name], CMA; 07/09/2018 3:01 PM) Caffeine use  Tea. No alcohol use  No drug use  Tobacco use  Current every day smoker.  Family History Megan [name], [place];  07/09/2018 3:01 PM) Arthritis  Mother. Bleeding disorder  Mother. Cerebrovascular Accident  Father. Colon Polyps  Mother, Sister. Diabetes Mellitus  Father. Heart Disease  Father, Mother. Heart disease in female family member before age 1  Heart disease in female family member before age 46  Hypertension  Sister. Ischemic Bowel Disease  Mother. Kidney Disease  Father. Migraine Headache  Sister. Thyroid problems  Mother.  Pregnancy / Birth History Megan [name], [place]; 07/09/2018 3:01 PM) Age at menarche  52 years. Age of menopause  22-50 Gravida  2 Maternal age  56-20 Para  2 Regular periods   Other Problems Megan [name], CMA; 07/09/2018 3:01 PM) Arthritis  Back Pain  Cholelithiasis  Hepatitis  High blood pressure  Other disease, cancer, significant illness  Thyroid Disease  Transfusion history     Review of Systems (Megan [name] CMA; 07/09/2018 3:01 PM) General Present- Weight Gain. Not Present- Appetite Loss, Chills, Fatigue, Fever, Night Sweats and Weight Loss. Skin Not Present- Change in Wart/Mole, Dryness, Hives, Jaundice, New Lesions, Non-Healing Wounds, Rash and Ulcer. HEENT Present- Visual Disturbances. Not Present- Earache, Hearing Loss, Hoarseness, Nose Bleed, Oral Ulcers, Ringing in the Ears, Seasonal Allergies, Sinus Pain, Sore Throat, Wears glasses/contact lenses and Yellow Eyes. Respiratory Not Present- Bloody sputum, Chronic Cough, Difficulty Breathing, Snoring and Wheezing. Breast Not Present- Breast Mass, Breast Pain, Nipple Discharge and Skin Changes. Cardiovascular Present- Leg Cramps. Not Present- Chest Pain, Difficulty Breathing Lying Down, Palpitations, Rapid Heart Rate, Shortness of Breath and Swelling of Extremities. Gastrointestinal Present- Bloating, Constipation and Nausea. Not Present- Abdominal Pain, Bloody Stool, Change in Bowel Habits, Chronic diarrhea, Difficulty Swallowing, Excessive gas, Gets full quickly at meals,  Hemorrhoids,  Indigestion, Rectal Pain and Vomiting. Female Genitourinary Not Present- Frequency, Nocturia, Painful Urination, Pelvic Pain and Urgency. Musculoskeletal Present- Back Pain, Joint Pain and Muscle Weakness. Not Present- Joint Stiffness, Muscle Pain and Swelling of Extremities. Neurological Present- Trouble walking. Not Present- Decreased Memory, Fainting, Headaches, Numbness, Seizures, Tingling, Tremor and Weakness. Psychiatric Not Present- Anxiety, Bipolar, Change in Sleep Pattern, Depression, Fearful and Frequent crying. Endocrine Not Present- Cold Intolerance, Excessive Hunger, Hair Changes, Heat Intolerance, Hot flashes and New Diabetes. Hematology Present- Gland problems. Not Present- Blood Thinners, Easy Bruising, Excessive bleeding, HIV and Persistent Infections.  Vitals (Megan [name] CMA; 07/09/2018 3:02 PM) 07/09/2018 3:02 PM Weight: 342 lb Height: 65in Body Surface Area: 2.48 m Body Mass Index: 56.91 kg/m  Temp.: 98.33F  Pulse: 71 (Regular)  P.OX: 95% (Room air) BP: 124/74 (Sitting, Left Arm, Standard)       Physical Exam (Megan Kabel B. Hassell Done MD; 07/09/2018 4:07 PM) The physical exam findings are as follows: Note:Obese WF NAD right eye artificial Chest clear Heart SR Abdomen obese with multiple scars from open chole Ext FROM    Assessment & Plan Megan Key B. Hassell Done MD; 07/09/2018 4:10 PM) MORBID OBESITY, UNSPECIFIED OBESITY TYPE (E66.01) Story: Two weeks prior to [name] Go on the extremely low carb liquid diet One week prior to [name] No aspirin products. Tylenol is acceptable Stop smoking 24 hours prior to [name] No alcoholic beverages Report fever greater than 100.5 or excessive nasal drainage suggesting infection Continue bariatric preop diet Perform bowel prep if ordered Do not eat or drink anything after midnight the night before surgery Do not take any medications except those instructed by the anesthesiologist Morning of  [name] Please arrive at the hospital at least 2 hours before your scheduled [name] time. No makeup, fingernail polish or jewelry Bring insurance cards with you Bring your CPAP mask if you use this Impression: I think that Ms. Eisenhart is a good candidate for bariatric [name] specifically a sleeve gastrectomy. She is motivated by her brother who has [name] well and hopes for resolution of her weight related comorbidities. Current Plans CBC, PLATELETS & AUT DIFF ([id number]) METABOLIC PANEL, COMPREHENSIVE (80053) TSH (THYROID STIMULATING HORMONE) (95188) HEMOGLOBIN GLYCLATED (HGB A1C) (83036) LIPID PANEL (41660) IRON (83540) VITAMIN B-12 (CYANOCOBALAMIN) (63016) VITAMIN D, 1, 25-DIHYDROXY IB:[id number]) H. PYLORI ANALYSIS UREASE ACTIVITY (83013) THIAMINE (B-1) (84425) Pregnancy, serum Pt Education - EMW_bariatric pathway Pt Education - [facility] H Pylori: discussed with patient and provided information. Referred to Psychology, for evaluation and follow up (Psychologist). Routine. Referred to Nutritional Counseling, for evaluation and follow up (Dietician/Health Nutritionist). Routine. CHEST X-RAY, PA AND LATERAL (01093) UGI W/ KUB PB:[id number]) EKG (93000) Signed by Pedro Earls, MD

## 2019-04-09 NOTE — Anesthesia Preprocedure Evaluation (Addendum)
Anesthesia Evaluation  Patient identified by MRN, date of birth, ID band Patient awake    Reviewed: Allergy & Precautions, NPO status , Patient's Chart, lab work & pertinent test results  Airway Mallampati: II  TM Distance: >3 FB     Dental   Pulmonary asthma , former smoker,    breath sounds clear to auscultation       Cardiovascular hypertension,  Rhythm:Regular Rate:Normal     Neuro/Psych  Neuromuscular disease    GI/Hepatic (+) Hepatitis -History noted CG   Endo/Other  Hypothyroidism   Renal/GU      Musculoskeletal  (+) Arthritis , Fibromyalgia -  Abdominal   Peds  Hematology  (+) anemia ,   Anesthesia Other Findings   Reproductive/Obstetrics                            Anesthesia Physical Anesthesia Plan  ASA: III  Anesthesia Plan: General   Post-op Pain Management:    Induction: Intravenous  PONV Risk Score and Plan: 3 and Dexamethasone, Ondansetron and Midazolam  Airway Management Planned: Oral ETT  Additional Equipment:   Intra-op Plan:   Post-operative Plan: Possible Post-op intubation/ventilation  Informed Consent: I have reviewed the patients History and Physical, chart, labs and discussed the procedure including the risks, benefits and alternatives for the proposed anesthesia with the patient or authorized representative who has indicated his/her understanding and acceptance.     Dental advisory given  Plan Discussed with: Anesthesiologist and CRNA  Anesthesia Plan Comments:        Anesthesia Quick Evaluation

## 2019-04-10 MED ORDER — BUPIVACAINE LIPOSOME 1.3 % IJ SUSP
20.0000 mL | Freq: Once | INTRAMUSCULAR | Status: DC
  Filled 2019-04-10: qty 20

## 2019-04-11 ENCOUNTER — Encounter (HOSPITAL_COMMUNITY): Payer: Self-pay | Admitting: Surgery

## 2019-04-11 ENCOUNTER — Other Ambulatory Visit: Payer: Self-pay

## 2019-04-11 ENCOUNTER — Inpatient Hospital Stay (HOSPITAL_COMMUNITY)
Admission: RE | Admit: 2019-04-11 | Discharge: 2019-04-13 | DRG: 621 | Disposition: A | Discharge status: Home / Self Care | Payer: Medicare Other | Attending: Surgery | Admitting: Surgery

## 2019-04-11 ENCOUNTER — Inpatient Hospital Stay (HOSPITAL_COMMUNITY): Payer: Medicare Other | Admitting: Anesthesiology

## 2019-04-11 ENCOUNTER — Encounter (HOSPITAL_COMMUNITY)
Admission: RE | Disposition: A | Discharge status: Home / Self Care | Payer: Self-pay | Source: Home / Self Care | Attending: Surgery

## 2019-04-11 DIAGNOSIS — K754 Autoimmune hepatitis: Secondary | ICD-10-CM | POA: Diagnosis not present

## 2019-04-11 DIAGNOSIS — Z880 Allergy status to penicillin: Secondary | ICD-10-CM | POA: Diagnosis not present

## 2019-04-11 DIAGNOSIS — Z9001 Acquired absence of eye: Secondary | ICD-10-CM | POA: Diagnosis not present

## 2019-04-11 DIAGNOSIS — Z7982 Long term (current) use of aspirin: Secondary | ICD-10-CM

## 2019-04-11 DIAGNOSIS — Z9884 Bariatric surgery status: Secondary | ICD-10-CM

## 2019-04-11 DIAGNOSIS — I10 Essential (primary) hypertension: Secondary | ICD-10-CM | POA: Diagnosis present

## 2019-04-11 DIAGNOSIS — F17210 Nicotine dependence, cigarettes, uncomplicated: Secondary | ICD-10-CM | POA: Diagnosis present

## 2019-04-11 DIAGNOSIS — Z6841 Body Mass Index (BMI) 40.0 and over, adult: Secondary | ICD-10-CM

## 2019-04-11 DIAGNOSIS — K5900 Constipation, unspecified: Secondary | ICD-10-CM | POA: Diagnosis present

## 2019-04-11 DIAGNOSIS — E039 Hypothyroidism, unspecified: Secondary | ICD-10-CM | POA: Diagnosis not present

## 2019-04-11 DIAGNOSIS — Z8249 Family history of ischemic heart disease and other diseases of the circulatory system: Secondary | ICD-10-CM

## 2019-04-11 DIAGNOSIS — Z885 Allergy status to narcotic agent status: Secondary | ICD-10-CM | POA: Diagnosis not present

## 2019-04-11 DIAGNOSIS — Z79891 Long term (current) use of opiate analgesic: Secondary | ICD-10-CM

## 2019-04-11 DIAGNOSIS — Z79899 Other long term (current) drug therapy: Secondary | ICD-10-CM

## 2019-04-11 DIAGNOSIS — K3184 Gastroparesis: Secondary | ICD-10-CM | POA: Diagnosis not present

## 2019-04-11 DIAGNOSIS — M1990 Unspecified osteoarthritis, unspecified site: Secondary | ICD-10-CM | POA: Diagnosis not present

## 2019-04-11 DIAGNOSIS — Z79890 Hormone replacement therapy (postmenopausal): Secondary | ICD-10-CM | POA: Diagnosis not present

## 2019-04-11 DIAGNOSIS — K7460 Unspecified cirrhosis of liver: Secondary | ICD-10-CM | POA: Diagnosis not present

## 2019-04-11 DIAGNOSIS — K449 Diaphragmatic hernia without obstruction or gangrene: Secondary | ICD-10-CM | POA: Diagnosis present

## 2019-04-11 DIAGNOSIS — G8929 Other chronic pain: Secondary | ICD-10-CM | POA: Diagnosis present

## 2019-04-11 HISTORY — PX: LAPAROSCOPIC GASTRIC SLEEVE RESECTION: SHX5895

## 2019-04-11 HISTORY — DX: Bariatric surgery status: Z98.84

## 2019-04-11 LAB — COMPREHENSIVE METABOLIC PANEL
ALT: 37 U/L (ref 0–44)
AST: 40 U/L (ref 15–41)
Albumin: 3.5 g/dL (ref 3.5–5.0)
Alkaline Phosphatase: 89 U/L (ref 38–126)
Anion gap: 8 (ref 5–15)
BUN: 13 mg/dL (ref 6–20)
CO2: 26 mmol/L (ref 22–32)
Calcium: 8.7 mg/dL — ABNORMAL LOW (ref 8.9–10.3)
Chloride: 104 mmol/L (ref 98–111)
Creatinine, Ser: 0.59 mg/dL (ref 0.44–1.00)
GFR calc Af Amer: >60 mL/min (ref >60)
GFR calc non Af Amer: >60 mL/min (ref >60)
Glucose, Bld: 100 mg/dL — ABNORMAL HIGH (ref 70–99)
Potassium: 3.6 mmol/L (ref 3.5–5.1)
Sodium: 138 mmol/L (ref 135–145)
Total Bilirubin: 0.6 mg/dL (ref 0.3–1.2)
Total Protein: 7.4 g/dL (ref 6.5–8.1)

## 2019-04-11 LAB — CREATININE, SERUM
Creatinine, Ser: 0.73 mg/dL (ref 0.44–1.00)
GFR calc Af Amer: >60 mL/min (ref >60)
GFR calc non Af Amer: >60 mL/min (ref >60)

## 2019-04-11 LAB — TYPE AND SCREEN
ABO/RH(D): A POS
Antibody Screen: NEGATIVE

## 2019-04-11 LAB — CBC
HCT: 36.3 % (ref 36.0–46.0)
Hemoglobin: 11.3 g/dL — ABNORMAL LOW (ref 12.0–15.0)
MCH: 28.4 pg (ref 26.0–34.0)
MCHC: 31.1 g/dL (ref 30.0–36.0)
MCV: 91.2 fL (ref 80.0–100.0)
Platelets: 155 10*3/uL (ref 150–400)
RBC: 3.98 MIL/uL (ref 3.87–5.11)
RDW: 14.6 % (ref 11.5–15.5)
WBC: 4 10*3/uL (ref 4.0–10.5)
nRBC: 0 % (ref 0.0–0.2)

## 2019-04-11 LAB — HEMOGLOBIN AND HEMATOCRIT, BLOOD
HCT: 37.1 % (ref 36.0–46.0)
Hemoglobin: 11.9 g/dL — ABNORMAL LOW (ref 12.0–15.0)

## 2019-04-11 LAB — ABO/RH: ABO/RH(D): A POS

## 2019-04-11 SURGERY — GASTRECTOMY, SLEEVE, LAPAROSCOPIC
Anesthesia: General | Site: Abdomen

## 2019-04-11 MED ORDER — OXYCODONE HCL 5 MG/5ML PO SOLN
5.0000 mg | Freq: Four times a day (QID) | ORAL | Status: DC | PRN
  Administered 2019-04-11 – 2019-04-13 (×6): 5 mg via ORAL
  Filled 2019-04-11 (×6): qty 5

## 2019-04-11 MED ORDER — LACTATED RINGERS IR SOLN
Status: DC | PRN
  Administered 2019-04-11: 1000 mL

## 2019-04-11 MED ORDER — GLYCOPYRROLATE 0.2 MG/ML IJ SOLN
INTRAMUSCULAR | Status: DC | PRN
  Administered 2019-04-11: .2 mg via INTRAVENOUS

## 2019-04-11 MED ORDER — SODIUM CHLORIDE 0.9 % IV SOLN
2.0000 g | INTRAVENOUS | Status: AC
  Administered 2019-04-11: 08:00:00 2 g via INTRAVENOUS

## 2019-04-11 MED ORDER — FENTANYL CITRATE (PF) 250 MCG/5ML IJ SOLN
INTRAMUSCULAR | Status: AC
  Filled 2019-04-11: qty 5

## 2019-04-11 MED ORDER — FENTANYL CITRATE (PF) 100 MCG/2ML IJ SOLN
25.0000 ug | INTRAMUSCULAR | Status: DC | PRN
  Administered 2019-04-11: 50 ug via INTRAVENOUS

## 2019-04-11 MED ORDER — METOCLOPRAMIDE HCL 5 MG/ML IJ SOLN
INTRAMUSCULAR | Status: AC
  Filled 2019-04-11: qty 2

## 2019-04-11 MED ORDER — GLYCOPYRROLATE PF 0.2 MG/ML IJ SOSY
PREFILLED_SYRINGE | INTRAMUSCULAR | Status: AC
  Filled 2019-04-11: qty 1

## 2019-04-11 MED ORDER — ACETAMINOPHEN 160 MG/5ML PO SOLN
1000.0000 mg | Freq: Three times a day (TID) | ORAL | Status: DC
  Administered 2019-04-12 – 2019-04-13 (×2): 1000 mg via ORAL
  Filled 2019-04-11 (×3): qty 40.6

## 2019-04-11 MED ORDER — DEXAMETHASONE SODIUM PHOSPHATE 10 MG/ML IJ SOLN
INTRAMUSCULAR | Status: DC | PRN
  Administered 2019-04-11: 10 mg via INTRAVENOUS

## 2019-04-11 MED ORDER — LIDOCAINE 20MG/ML (2%) 15 ML SYRINGE OPTIME
INTRAMUSCULAR | Status: DC | PRN
  Administered 2019-04-11: 1.5 mg/kg/h via INTRAVENOUS

## 2019-04-11 MED ORDER — SODIUM CHLORIDE (PF) 0.9 % IJ SOLN
INTRAMUSCULAR | Status: AC
  Filled 2019-04-11: qty 10

## 2019-04-11 MED ORDER — 0.9 % SODIUM CHLORIDE (POUR BTL) OPTIME
TOPICAL | Status: DC | PRN
  Administered 2019-04-11: 1000 mL

## 2019-04-11 MED ORDER — MIDAZOLAM HCL 5 MG/5ML IJ SOLN
INTRAMUSCULAR | Status: DC | PRN
  Administered 2019-04-11: 2 mg via INTRAVENOUS

## 2019-04-11 MED ORDER — GABAPENTIN 300 MG PO CAPS
ORAL_CAPSULE | ORAL | Status: AC
  Administered 2019-04-11: 300 mg via ORAL
  Filled 2019-04-11: qty 1

## 2019-04-11 MED ORDER — MIDAZOLAM HCL 2 MG/2ML IJ SOLN
INTRAMUSCULAR | Status: AC
  Filled 2019-04-11: qty 2

## 2019-04-11 MED ORDER — PROPOFOL 10 MG/ML IV BOLUS
INTRAVENOUS | Status: DC | PRN
  Administered 2019-04-11: 170 mg via INTRAVENOUS

## 2019-04-11 MED ORDER — SODIUM CHLORIDE 0.9 % IV SOLN
INTRAVENOUS | Status: AC
  Filled 2019-04-11: qty 2

## 2019-04-11 MED ORDER — SUCCINYLCHOLINE CHLORIDE 200 MG/10ML IV SOSY
PREFILLED_SYRINGE | INTRAVENOUS | Status: DC | PRN
  Administered 2019-04-11: 140 mg via INTRAVENOUS

## 2019-04-11 MED ORDER — APREPITANT 40 MG PO CAPS: 40.0000 mg | ORAL_CAPSULE | ORAL | Status: AC

## 2019-04-11 MED ORDER — APREPITANT 40 MG PO CAPS
ORAL_CAPSULE | ORAL | Status: AC
  Administered 2019-04-11: 40 mg via ORAL
  Filled 2019-04-11: qty 1

## 2019-04-11 MED ORDER — LIDOCAINE HCL 2 % IJ SOLN
INTRAMUSCULAR | Status: AC
  Filled 2019-04-11: qty 20

## 2019-04-11 MED ORDER — METOPROLOL TARTRATE 5 MG/5ML IV SOLN
5.0000 mg | Freq: Four times a day (QID) | INTRAVENOUS | Status: DC | PRN
  Administered 2019-04-13: 5 mg via INTRAVENOUS
  Filled 2019-04-11: qty 5

## 2019-04-11 MED ORDER — SUCCINYLCHOLINE CHLORIDE 200 MG/10ML IV SOSY
PREFILLED_SYRINGE | INTRAVENOUS | Status: AC
  Filled 2019-04-11: qty 10

## 2019-04-11 MED ORDER — FENTANYL CITRATE (PF) 100 MCG/2ML IJ SOLN
INTRAMUSCULAR | Status: AC
  Filled 2019-04-11: qty 2

## 2019-04-11 MED ORDER — CHLORHEXIDINE GLUCONATE CLOTH 2 % EX PADS: 6.0000 | MEDICATED_PAD | Freq: Once | CUTANEOUS | Status: DC

## 2019-04-11 MED ORDER — ONDANSETRON HCL 4 MG/2ML IJ SOLN
4.0000 mg | INTRAMUSCULAR | Status: DC | PRN
  Administered 2019-04-11 – 2019-04-13 (×5): 4 mg via INTRAVENOUS
  Filled 2019-04-11 (×6): qty 2

## 2019-04-11 MED ORDER — FENTANYL CITRATE (PF) 100 MCG/2ML IJ SOLN
12.5000 ug | INTRAMUSCULAR | Status: DC | PRN
  Administered 2019-04-13: 12.5 ug via INTRAVENOUS
  Filled 2019-04-11: qty 2

## 2019-04-11 MED ORDER — LIDOCAINE 2% (20 MG/ML) 5 ML SYRINGE
INTRAMUSCULAR | Status: AC
  Filled 2019-04-11: qty 5

## 2019-04-11 MED ORDER — PANTOPRAZOLE SODIUM 40 MG IV SOLR
40.0000 mg | Freq: Every day | INTRAVENOUS | Status: DC
  Administered 2019-04-11 – 2019-04-12 (×2): 40 mg via INTRAVENOUS
  Filled 2019-04-11 (×2): qty 40

## 2019-04-11 MED ORDER — ONDANSETRON HCL 4 MG/2ML IJ SOLN
INTRAMUSCULAR | Status: AC
  Filled 2019-04-11: qty 2

## 2019-04-11 MED ORDER — EPHEDRINE 5 MG/ML INJ
INTRAVENOUS | Status: AC
  Filled 2019-04-11: qty 10

## 2019-04-11 MED ORDER — HEPARIN SODIUM (PORCINE) 5000 UNIT/ML IJ SOLN
5000.0000 [IU] | Freq: Three times a day (TID) | INTRAMUSCULAR | Status: DC
  Administered 2019-04-11 – 2019-04-13 (×7): 5000 [IU] via SUBCUTANEOUS
  Filled 2019-04-11 (×7): qty 1

## 2019-04-11 MED ORDER — PROMETHAZINE HCL 25 MG/ML IJ SOLN
12.5000 mg | Freq: Four times a day (QID) | INTRAMUSCULAR | Status: DC | PRN
  Administered 2019-04-11 – 2019-04-12 (×2): 12.5 mg via INTRAVENOUS
  Filled 2019-04-11 (×2): qty 1

## 2019-04-11 MED ORDER — EPHEDRINE SULFATE-NACL 50-0.9 MG/10ML-% IV SOSY
PREFILLED_SYRINGE | INTRAVENOUS | Status: DC | PRN
  Administered 2019-04-11: 20 mg via INTRAVENOUS

## 2019-04-11 MED ORDER — SODIUM CHLORIDE (PF) 0.9 % IJ SOLN
INTRAMUSCULAR | Status: DC | PRN
  Administered 2019-04-11: 10 mL

## 2019-04-11 MED ORDER — HEPARIN SODIUM (PORCINE) 5000 UNIT/ML IJ SOLN
INTRAMUSCULAR | Status: AC
  Administered 2019-04-11: 5000 [IU] via SUBCUTANEOUS
  Filled 2019-04-11: qty 1

## 2019-04-11 MED ORDER — KETAMINE HCL 10 MG/ML IJ SOLN
INTRAMUSCULAR | Status: DC | PRN
  Administered 2019-04-11: 30 mg via INTRAVENOUS

## 2019-04-11 MED ORDER — SUGAMMADEX SODIUM 200 MG/2ML IV SOLN
INTRAVENOUS | Status: DC | PRN
  Administered 2019-04-11: 500 mg via INTRAVENOUS

## 2019-04-11 MED ORDER — LIP MEDEX EX OINT
TOPICAL_OINTMENT | CUTANEOUS | Status: DC | PRN
  Filled 2019-04-11: qty 7

## 2019-04-11 MED ORDER — LACTATED RINGERS IV SOLN: INTRAVENOUS | Status: DC

## 2019-04-11 MED ORDER — KETAMINE HCL 10 MG/ML IJ SOLN
INTRAMUSCULAR | Status: AC
  Filled 2019-04-11: qty 1

## 2019-04-11 MED ORDER — SCOPOLAMINE 1 MG/3DAYS TD PT72
MEDICATED_PATCH | TRANSDERMAL | Status: AC
  Administered 2019-04-11: 1.5 mg via TRANSDERMAL
  Filled 2019-04-11: qty 1

## 2019-04-11 MED ORDER — GABAPENTIN 300 MG PO CAPS: 300.0000 mg | ORAL_CAPSULE | ORAL | Status: AC

## 2019-04-11 MED ORDER — BUPIVACAINE LIPOSOME 1.3 % IJ SUSP
INTRAMUSCULAR | Status: DC | PRN
  Administered 2019-04-11: 20 mL

## 2019-04-11 MED ORDER — SUGAMMADEX SODIUM 500 MG/5ML IV SOLN
INTRAVENOUS | Status: AC
  Filled 2019-04-11: qty 5

## 2019-04-11 MED ORDER — SCOPOLAMINE 1 MG/3DAYS TD PT72: 1.0000 | MEDICATED_PATCH | TRANSDERMAL | Status: DC

## 2019-04-11 MED ORDER — ACETAMINOPHEN 500 MG PO TABS
ORAL_TABLET | ORAL | Status: AC
  Administered 2019-04-11: 1000 mg via ORAL
  Filled 2019-04-11: qty 2

## 2019-04-11 MED ORDER — METOCLOPRAMIDE HCL 5 MG/ML IJ SOLN
5.0000 mg | Freq: Once | INTRAMUSCULAR | Status: AC
  Administered 2019-04-11: 5 mg via INTRAVENOUS

## 2019-04-11 MED ORDER — LIDOCAINE 2% (20 MG/ML) 5 ML SYRINGE
INTRAMUSCULAR | Status: DC | PRN
  Administered 2019-04-11: 80 mg via INTRAVENOUS

## 2019-04-11 MED ORDER — HEPARIN SODIUM (PORCINE) 5000 UNIT/ML IJ SOLN: 5000.0000 [IU] | INTRAMUSCULAR | Status: AC

## 2019-04-11 MED ORDER — ACETAMINOPHEN 500 MG PO TABS
1000.0000 mg | ORAL_TABLET | Freq: Three times a day (TID) | ORAL | Status: DC
  Administered 2019-04-11 – 2019-04-13 (×4): 1000 mg via ORAL
  Filled 2019-04-11 (×4): qty 2

## 2019-04-11 MED ORDER — ONDANSETRON HCL 4 MG/2ML IJ SOLN
INTRAMUSCULAR | Status: DC | PRN
  Administered 2019-04-11: 4 mg via INTRAVENOUS

## 2019-04-11 MED ORDER — ROCURONIUM BROMIDE 10 MG/ML (PF) SYRINGE
PREFILLED_SYRINGE | INTRAVENOUS | Status: AC
  Filled 2019-04-11: qty 10

## 2019-04-11 MED ORDER — ROCURONIUM BROMIDE 10 MG/ML (PF) SYRINGE
PREFILLED_SYRINGE | INTRAVENOUS | Status: DC | PRN
  Administered 2019-04-11: 70 mg via INTRAVENOUS
  Administered 2019-04-11: 20 mg via INTRAVENOUS

## 2019-04-11 MED ORDER — KCL IN DEXTROSE-NACL 20-5-0.45 MEQ/L-%-% IV SOLN
INTRAVENOUS | Status: DC
  Filled 2019-04-11 (×6): qty 1000

## 2019-04-11 MED ORDER — PROPOFOL 10 MG/ML IV BOLUS
INTRAVENOUS | Status: AC
  Filled 2019-04-11: qty 40

## 2019-04-11 MED ORDER — FENTANYL CITRATE (PF) 250 MCG/5ML IJ SOLN
INTRAMUSCULAR | Status: DC | PRN
  Administered 2019-04-11: 100 ug via INTRAVENOUS
  Administered 2019-04-11 (×3): 50 ug via INTRAVENOUS

## 2019-04-11 MED ORDER — ENSURE MAX PROTEIN PO LIQD
2.0000 [oz_av] | ORAL | Status: DC
  Administered 2019-04-13 (×3): 2 [oz_av] via ORAL

## 2019-04-11 MED ORDER — ACETAMINOPHEN 500 MG PO TABS: 1000.0000 mg | ORAL_TABLET | ORAL | Status: AC

## 2019-04-11 MED ORDER — DEXAMETHASONE SODIUM PHOSPHATE 10 MG/ML IJ SOLN
INTRAMUSCULAR | Status: AC
  Filled 2019-04-11: qty 1

## 2019-04-11 SURGICAL SUPPLY — 67 items
ADH SKN CLS APL DERMABOND .7 (GAUZE/BANDAGES/DRESSINGS) ×1
APL SWBSTK 6 STRL LF DISP (MISCELLANEOUS)
APPLICATOR COTTON TIP 6 STRL (MISCELLANEOUS) IMPLANT
APPLICATOR COTTON TIP 6IN STRL (MISCELLANEOUS)
APPLIER CLIP 5 13 M/L LIGAMAX5 (MISCELLANEOUS)
APPLIER CLIP ROT 10 11.4 M/L (STAPLE)
APPLIER CLIP ROT 13.4 12 LRG (CLIP)
APR CLP LRG 13.4X12 ROT 20 MLT (CLIP)
APR CLP MED LRG 11.4X10 (STAPLE)
APR CLP MED LRG 5 ANG JAW (MISCELLANEOUS)
BLADE SURG 15 STRL LF DISP TIS (BLADE) ×1 IMPLANT
BLADE SURG 15 STRL SS (BLADE) ×2
CABLE HIGH FREQUENCY MONO STRZ (ELECTRODE) ×1 IMPLANT
CLIP APPLIE 5 13 M/L LIGAMAX5 (MISCELLANEOUS) IMPLANT
CLIP APPLIE ROT 10 11.4 M/L (STAPLE) IMPLANT
CLIP APPLIE ROT 13.4 12 LRG (CLIP) IMPLANT
COVER WAND RF STERILE (DRAPES) IMPLANT
DECANTER SPIKE VIAL GLASS SM (MISCELLANEOUS) ×2 IMPLANT
DERMABOND ADVANCED (GAUZE/BANDAGES/DRESSINGS) ×1
DERMABOND ADVANCED .7 DNX12 (GAUZE/BANDAGES/DRESSINGS) IMPLANT
DEVICE SUT QUICK LOAD TK 5 (STAPLE) ×1 IMPLANT
DEVICE SUT TI-KNOT TK 5X26 (MISCELLANEOUS) ×1 IMPLANT
DEVICE SUTURE ENDOST 10MM (ENDOMECHANICALS) ×1 IMPLANT
DISSECTOR BLUNT TIP ENDO 5MM (MISCELLANEOUS) ×1 IMPLANT
ELECT REM PT RETURN 15FT ADLT (MISCELLANEOUS) ×2 IMPLANT
GAUZE SPONGE 4X4 12PLY STRL (GAUZE/BANDAGES/DRESSINGS) IMPLANT
GLOVE BIOGEL M 8.0 STRL (GLOVE) ×2 IMPLANT
GOWN STRL REUS W/TWL XL LVL3 (GOWN DISPOSABLE) ×10 IMPLANT
GRASPER SUT TROCAR 14GX15 (MISCELLANEOUS) ×2 IMPLANT
HANDLE STAPLE EGIA 4 XL (STAPLE) ×2 IMPLANT
HOVERMATT SINGLE USE (MISCELLANEOUS) ×2 IMPLANT
KIT BASIN OR (CUSTOM PROCEDURE TRAY) ×2 IMPLANT
KIT TURNOVER KIT A (KITS) ×1 IMPLANT
MARKER SKIN DUAL TIP RULER LAB (MISCELLANEOUS) ×2 IMPLANT
NDL SPNL 22GX3.5 QUINCKE BK (NEEDLE) ×1 IMPLANT
NEEDLE SPNL 22GX3.5 QUINCKE BK (NEEDLE) ×2 IMPLANT
PACK CARDIOVASCULAR III (CUSTOM PROCEDURE TRAY) ×2 IMPLANT
RELOAD STAPLE 45 PURP MED/THCK (STAPLE) IMPLANT
RELOAD TRI 45 ART MED THCK BLK (STAPLE) ×2 IMPLANT
RELOAD TRI 45 ART MED THCK PUR (STAPLE) IMPLANT
RELOAD TRI 60 ART MED THCK BLK (STAPLE) ×2 IMPLANT
RELOAD TRI 60 ART MED THCK PUR (STAPLE) ×2 IMPLANT
SCISSORS LAP 5X45 EPIX DISP (ENDOMECHANICALS) IMPLANT
SET IRRIG TUBING LAPAROSCOPIC (IRRIGATION / IRRIGATOR) ×2 IMPLANT
SET TUBE SMOKE EVAC HIGH FLOW (TUBING) ×2 IMPLANT
SHEARS HARMONIC ACE PLUS 45CM (MISCELLANEOUS) ×2 IMPLANT
SLEEVE ADV FIXATION 5X100MM (TROCAR) ×4 IMPLANT
SLEEVE GASTRECTOMY 36FR VISIGI (MISCELLANEOUS) ×2 IMPLANT
SOL ANTI FOG 6CC (MISCELLANEOUS) ×1 IMPLANT
SOLUTION ANTI FOG 6CC (MISCELLANEOUS) ×1
SPONGE LAP 18X18 RF (DISPOSABLE) ×2 IMPLANT
STAPLER VISISTAT 35W (STAPLE) IMPLANT
SUT MNCRL AB 4-0 PS2 18 (SUTURE) ×4 IMPLANT
SUT SURGIDAC NAB ES-9 0 48 120 (SUTURE) ×1 IMPLANT
SUT VICRYL 0 TIES 12 18 (SUTURE) ×2 IMPLANT
SYR 10ML ECCENTRIC (SYRINGE) ×2 IMPLANT
SYR 20ML LL LF (SYRINGE) ×2 IMPLANT
SYR 50ML LL SCALE MARK (SYRINGE) ×2 IMPLANT
TOWEL OR 17X26 10 PK STRL BLUE (TOWEL DISPOSABLE) ×2 IMPLANT
TOWEL OR NON WOVEN STRL DISP B (DISPOSABLE) ×2 IMPLANT
TRAY FOLEY MTR SLVR 16FR STAT (SET/KITS/TRAYS/PACK) IMPLANT
TROCAR ADV FIXATION 5X100MM (TROCAR) ×2 IMPLANT
TROCAR BLADELESS 15MM (ENDOMECHANICALS) ×2 IMPLANT
TROCAR BLADELESS OPT 5 100 (ENDOMECHANICALS) ×2 IMPLANT
TUBE CALIBRATION LAPBAND (TUBING) IMPLANT
TUBING CONNECTING 10 (TUBING) ×4 IMPLANT
TUBING ENDO SMARTCAP (MISCELLANEOUS) ×2 IMPLANT

## 2019-04-11 NOTE — Progress Notes (Signed)
Patient has walked twice to bathroom on her own. She has persistent complaints on nausea. Dr. Hassell Done has been paged. Incentive spirometer has not yet been demonstrated due to patient complaints of nausea. Will try again later.

## 2019-04-11 NOTE — Transfer of Care (Signed)
Immediate Anesthesia Transfer of Care Note  Patient: [place] Schwertner [facility]  Procedure(s) Performed: LAPAROSCOPIC GASTRIC SLEEVE RESECTION, WITH HIATAL HERNIA REPAIR, Upper Endo, ERAS Pathway (N/A Abdomen)  Patient Location: PACU  Anesthesia Type:General  Level of Consciousness: sedated  Airway & Oxygen Therapy: Patient Spontanous Breathing and Patient connected to face mask oxygen  Post-op Assessment: Report given to RN and Post -op Vital signs reviewed and stable  Post vital signs: Reviewed and stable  Last Vitals:  Vitals Value Taken Time  BP 140/84 04/11/19 1002  Temp    Pulse 61 04/11/19 1003  Resp 18 04/11/19 1003  SpO2 100 % 04/11/19 1003  Vitals shown include unvalidated device data.  Last Pain: There were no vitals filed for this visit.       Complications: No apparent anesthesia complications

## 2019-04-11 NOTE — Discharge Instructions (Signed)
° ° ° °GASTRIC BYPASS/SLEEVE ° Home Care Instructions ° ° These instructions are to help you care for yourself when you go home. ° °Call: If you have any problems. °• Call [phone] and ask for the surgeon on call °• If you need immediate help, come to the ER at [facility].  °• Tell the ER staff that you are a new post-op gastric bypass or gastric sleeve patient °  °Signs and symptoms to report: • Severe vomiting or nausea °o If you cannot keep down clear liquids for longer than 1 day, call your surgeon  °• Abdominal pain that does not get better after taking your pain medication °• Fever over 100.4° F with chills °• Heart beating over 100 beats a minute °• Shortness of breath at rest °• Chest pain °•  Redness, swelling, drainage, or foul odor at incision (surgical) sites °•  If your incisions open or pull apart °• Swelling or pain in calf (lower leg) °• Diarrhea (Loose bowel movements that happen often), frequent watery, uncontrolled bowel movements °• Constipation, (no bowel movements for 3 days) if this happens: Pick one °o Milk of Magnesia, 2 tablespoons by mouth, 3 times a day for 2 days if needed °o Stop taking Milk of Magnesia once you have a bowel movement °o Call your doctor if constipation continues °Or °o Miralax  (instead of Milk of Magnesia) following the label instructions °o Stop taking Miralax once you have a bowel movement °o Call your doctor if constipation continues °• Anything you think is not normal °  °Normal side effects after surgery: • Unable to sleep at night or unable to focus °• Irritability or moody °• Being tearful (crying) or depressed °These are common complaints, possibly related to your anesthesia medications that put you to sleep, stress of surgery, and change in lifestyle.  This usually goes away a few weeks after surgery.  If these feelings continue, call your primary care doctor. °  °Wound Care: You may have surgical glue, steri-strips, or staples over your incisions after  surgery °• Surgical glue:  Looks like a clear film over your incisions and will wear off a little at a time °• Steri-strips: Strips of tape over your incisions. You may notice a yellowish color on the skin under the steri-strips. This is used to make the   steri-strips stick better. Do not pull the steri-strips off - let them fall off °• Staples: Staples may be removed before you leave the hospital °o If you go home with staples, call [facility], [phone] at for an appointment with your surgeon’s nurse to have staples removed 10 days after surgery. °• Showering: You may shower two (2) days after your surgery unless your surgeon tells you differently °o Wash gently around incisions with warm soapy water, rinse well, and gently pat dry  °o No tub baths until staples are removed, steri-strips fall off or glue is gone.  °  °Medications: • Medications should be liquid or crushed if larger than the size of a dime °• Extended release pills (medication that release a little bit at a time through the day) should NOT be crushed or cut. (examples include XL, ER, DR, SR) °• Depending on the size and number of medications you take, you may need to space (take a few throughout the day)/change the time you take your medications so that you do not over-fill your pouch (smaller stomach) °• Make sure you follow-up with your primary care doctor to   make medication changes needed during rapid weight loss and life-style changes °• If you have diabetes, follow up with the doctor that orders your diabetes medication(s) within one week after surgery and check your blood sugar regularly. °• Do not drive while taking prescription pain medication  °• It is ok to take Tylenol by the bottle instructions with your pain medicine or instead of your pain medicine as needed.  DO NOT TAKE NSAIDS (EXAMPLES OF NSAIDS:  IBUPROFREN/ NAPROXEN)  °Diet:                    First 2 Weeks ° You will see the dietician t about two (2) weeks  after your surgery. The dietician will increase the types of foods you can eat if you are handling liquids well: °• If you have severe vomiting or nausea and cannot keep down clear liquids lasting longer than 1 day, call your surgeon @ ([phone]) °Protein Shake °• Drink at least 2 ounces of shake 5-6 times per day °• Each serving of protein shakes (usually 8 - 12 ounces) should have: °o 15 grams of protein  °o And no more than 5 grams of carbohydrate  °• Goal for protein each day: °o Men = 80 grams per day °o Women = 60 grams per day °• Protein powder may be added to fluids such as non-fat milk or Lactaid milk or unsweetened Soy/Almond milk (limit to 35 grams added protein powder per serving) ° °Hydration °• Slowly increase the amount of water and other clear liquids as tolerated (See Acceptable Fluids) °• Slowly increase the amount of protein shake as tolerated  °•  Sip fluids slowly and throughout the day.  Do not use straws. °• May use sugar substitutes in small amounts (no more than 6 - 8 packets per day; i.e. Splenda) ° °Fluid Goal °• The first goal is to drink at least 8 ounces of protein shake/drink per day (or as directed by the nutritionist); some examples of protein shakes are Syntrax Nectar, Adkins Advantage, EAS Edge HP, and Unjury. See handout from pre-op Bariatric Education Class: °o Slowly increase the amount of protein shake you drink as tolerated °o You may find it easier to slowly sip shakes throughout the day °o It is important to get your proteins in first °• Your fluid goal is to drink 64 - 100 ounces of fluid daily °o It may take a few weeks to build up to this °• 32 oz (or more) should be clear liquids  °And  °• 32 oz (or more) should be full liquids (see below for examples) °• Liquids should not contain sugar, caffeine, or carbonation ° °Clear Liquids: °• Water or Sugar-free flavored water (i.e. Fruit H2O, Propel) °• Decaffeinated coffee or tea (sugar-free) °• Crystal Lite, Wyler’s Lite,  Minute Maid Lite °• Sugar-free Jell-O °• Bouillon or broth °• Sugar-free Popsicle:   *Less than 20 calories each; Limit 1 per day ° °Full Liquids: °Protein Shakes/Drinks + 2 choices per day of other full liquids °• Full liquids must be: °o No More Than 15 grams of Carbs per serving  °o No More Than 3 grams of Fat per serving °• Strained low-fat cream soup (except Cream of Potato or Tomato) °• Non-Fat milk °• Fat-free Lactaid Milk °• Unsweetened Soy Or Unsweetened Almond Milk °• Low Sugar yogurt (Dannon Lite & Fit, Greek yogurt; Oikos Triple Zero; Chobani Simply 100; Yoplait 100 calorie Greek - No Fruit on the Bottom) ° °  °Vitamins   and Minerals • Start 1 day after surgery unless otherwise directed by your surgeon °• 2 Chewable Bariatric Specific Multivitamin / Multimineral Supplement with iron (Example: Bariatric Advantage Multi EA) °• Chewable Calcium with Vitamin D-3 °(Example: 3 Chewable Calcium Plus 600 with Vitamin D-3) °o Take 500 mg three (3) times a day for a total of 1500 mg each day °o Do not take all 3 doses of calcium at one time as it may cause constipation, and you can only absorb 500 mg  at a time  °o Do not mix multivitamins containing iron with calcium supplements; take 2 hours apart °• Menstruating women and those with a history of anemia (a blood disease that causes weakness) may need extra iron °o Talk with your doctor to see if you need more iron °• Do not stop taking or change any vitamins or minerals until you talk to your dietitian or surgeon °• Your Dietitian and/or surgeon must approve all vitamin and mineral supplements °  °Activity and Exercise: Limit your physical activity as instructed by your doctor.  It is important to continue walking at home.  During this time, use these guidelines: °• Do not lift anything greater than ten (10) pounds for at least two (2) weeks °• Do not go back to work or drive until your surgeon says you can °• You may have sex when you feel comfortable  °o It is  VERY important for female patients to use a reliable birth control method; fertility often increases after surgery  °o All hormonal birth control will be ineffective for 30 days after surgery due to medications given during surgery a barrier method must be used. °o Do not get pregnant for at least 18 months °• Start exercising as soon as your doctor tells you that you can °o Make sure your doctor approves any physical activity °• Start with a simple walking program °• Walk 5-15 minutes each day, 7 days per week.  °• Slowly increase until you are walking 30-45 minutes per day °Consider joining our BELT program. [phone] or email [email or web address] °  °Special Instructions Things to remember: °• Use your CPAP when sleeping if this applies to you ° °• [facility] has two free Bariatric Surgery Support Groups that meet monthly °o The 3rd Thursday of each month, 6 pm, [facility]  °o The 2nd Friday of each month, 11:45 am in the private dining room in the basement of [facility] °• It is very important to keep all follow up appointments with your surgeon, dietitian, primary care physician, and behavioral health practitioner °• Routine follow up schedule with your surgeon include appointments at 2-3 weeks, 6-8 weeks, 6 months, and 1 year at a minimum.  Your surgeon may request to see you more often.   °o After the first year, please follow up with your bariatric surgeon and dietitian at least once a year in order to maintain best weight loss results °[facility]: [phone] °[facility]: [phone] °Bariatric Nurse Coordinator: [phone] °  °   Reviewed and Endorsed  °by [facility] Patient Education Committee, June, 2016 °Edits Approved: Aug, 2018 ° ° ° °

## 2019-04-11 NOTE — Interval H&P Note (Signed)
History and Physical Interval Note:  04/11/2019 7:05 AM  Megan [name]  has presented today for surgery, with the diagnosis of Morbid Obesity, HTN, Hypothyroidsim, Arthritis,.  The various methods of treatment have been discussed with the patient and family. After consideration of risks, benefits and other options for treatment, the patient has consented to  Procedure(s): LAPAROSCOPIC GASTRIC SLEEVE RESECTION, Upper Endo, ERAS Pathway (N/A) as a surgical intervention.  The patient's history has been reviewed, patient examined, no change in status, stable for surgery.  I have reviewed the patient's chart and labs.  Questions were answered to the patient's satisfaction.     Pedro Earls

## 2019-04-11 NOTE — Anesthesia Postprocedure Evaluation (Signed)
Anesthesia Post Note  Patient: Megan [name]  Procedure(s) Performed: LAPAROSCOPIC GASTRIC SLEEVE RESECTION, WITH HIATAL HERNIA REPAIR, Upper Endo, ERAS Pathway (N/A Abdomen)     Patient location during evaluation: PACU Anesthesia Type: General Level of consciousness: awake Pain management: pain level controlled Vital Signs Assessment: post-procedure vital signs reviewed and stable Respiratory status: spontaneous breathing Cardiovascular status: stable Postop Assessment: no apparent nausea or vomiting Anesthetic complications: no    Last Vitals:  Vitals:   04/11/19 1334 04/11/19 1442  BP: 104/81 (!) 147/97  Pulse: 60 71  Resp: 18 18  Temp: (!) 36.3 C 36.5 C  SpO2: 100% 100%    Last Pain:  Vitals:   04/11/19 1452  TempSrc:   PainSc: 7                  Inri Sobieski

## 2019-04-11 NOTE — Progress Notes (Signed)
NUTRITION NOTE  Consult received for bariatric diet education per DROP protocol. Patient is POD #0 gastric sleeve resection. Bariatric Nurse Educator to provide all post-op/pre-d/c education, including diet-related information.   If additional nutrition-related needs arise, please re-consult RD.     Jarome Matin, MS, RD, LDN, CNSC Inpatient Clinical Dietitian RD pager # available in [place]  After hours/weekend pager # available in [facility]

## 2019-04-11 NOTE — Progress Notes (Signed)
Discussed post op day goals with patient including ambulation, IS, diet progression, pain, and nausea control.  BSTOP education provided including BSTOP information guide, "Guide for Pain Management after your Bariatric Procedure".  Questions answered. 

## 2019-04-11 NOTE — Progress Notes (Signed)
Patient has started water. 

## 2019-04-11 NOTE — Anesthesia Procedure Notes (Signed)
Procedure Name: Intubation Date/Time: 04/11/2019 7:37 AM Performed by: Talbot Grumbling, CRNA Pre-anesthesia Checklist: Patient identified, Emergency Drugs available, Suction available and Patient being monitored Patient Re-evaluated:Patient Re-evaluated prior to induction Oxygen Delivery Method: Circle system utilized Preoxygenation: Pre-oxygenation with 100% oxygen Induction Type: IV induction Ventilation: Mask ventilation without difficulty Laryngoscope Size: Mac and 3 Grade View: Grade I Tube type: Oral Tube size: 7.5 mm Number of attempts: 1 Airway Equipment and Method: Stylet Placement Confirmation: ETT inserted through vocal cords under direct vision,  positive ETCO2 and breath sounds checked- equal and bilateral Secured at: 22 cm Tube secured with: Tape Dental Injury: Teeth and Oropharynx as per pre-operative assessment

## 2019-04-11 NOTE — Op Note (Signed)
11 April 2019  Surgeon: Kaylyn Lim, MD, FACS  Asst:  Alphonsa Overall, MD, FACS  Anes:  General endotracheal  Procedure: Laparoscopic one suture posterior repair of hiatus,  sleeve gastrectomy and upper endoscopy  Diagnosis: Morbid obesity and hiatal hernia by UGI--cirrhosis Complications: Stomach was full of green beans and white fish  EBL:   15 cc  Description of Procedure:  The patient was take to OR 1 and given general anesthesia.  The abdomen was prepped with Chloroprep and draped sterilely.  A timeout was performed.  Access to the abdomen was achieved with a 5 mm Optiview through the left upper quadrant.  Following insufflation, the state of the abdomen was found to be adheased from open gallbladder and marked chronic cirrhosis was noted.   These adhesions were taken down with sharp scissors and Harmonic dissection.  A dimple was noted and the balloon test was performed and this was negative for hiatal hernia-this may have been because of the volume of green beans that were subsequently found .  A posterior dissection was done and the right and left crus were identified and a single #1 Surgidek was placed with the Endostitch.   The ViSiGi 36Fr tube was inserted to deflate the stomach and was pulled back into the esophagus.    The pylorus was identified and we measured 5 cm back and marked the antrum.  At that point we began dissection to take down the greater curvature of the stomach using the Harmonic scalpel.  This dissection was taken all the way up to the left crus.  Posterior attachments of the stomach were also taken down.       The ViSiGi tube was then passed into the antrum and suction applied so that it was snug along the lessor curvature.  The "crow's foot" or incisura was identified.  The sleeve gastrectomy was begun using the [employer] stapler beginning with a 4.5 cm black load with TRS and then a 6 cm black load with TRS and the sleeve was completed with multiple  purple loads 6 cm with TRS.  When the sleeve was complete the tube was taken off suction and insufflated briefly.  The tube was withdrawn.  Upper endoscopy was then performed by Dr. Lucia Gaskins.  The sleeve was reinspected for foreign material and leaks along the staple line.     The specimen was extracted through the 15 trocar site.  Local was provided by infiltrating with 30 cc of Exparel injected as a TAP block and closed 4-0 Monocryl and Dermabond.    Matt B. Hassell Done, [place], [facility], [place]
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
## 2019-04-11 NOTE — Progress Notes (Signed)
Name:  JENCY ELLINWOOD MRN: [id number] Date of Surgery: 04/11/2019  Preop Diagnosis:  Morbid Obesity  Postop Diagnosis:  Morbid Obesity (Weight - 140 kg, BMI - 48.4), S/P Gastric Sleeve resection  Procedure:  Upper endoscopy  (Intraoperative)  Surgeon:  Alphonsa Overall, [name]  Anesthesia:  GET  Indications for procedure: KONSTANCE DAVERN is a 54 y.o. female whose primary care physician is [name], NP and has completed a gastric sleeve resection today for weight loss by Dr. Hassell Done.  I am doing an intraoperative upper endoscopy to evaluate the gastric pouch after the sleeve gastrectomy.  Operative Note: The patient is under general anesthesia.  Dr. Hassell Done is laparoscoping the patient while I do an upper endoscopy to evaluate the stomach pouch.  With the patient intubated, I passed the Olympus upper endoscope without difficulty down the esophagus.  The esophagus was unremarkable.  The esophago-gastric junction was at 37 cm.    The mucosa of the stomach looked viable and the staple line was intact without bleeding.  She had retained food, which obstructed about 1/2 of the lumen of the sleeve pouch.  I advanced the scope to the pylorus, but did not go through it.  While I insufflated the stomach pouch with air, Dr. Hassell Done flooded the upper abdomen with saline to put the gastric pouch under saline.  There was no bubbling or evidence of a leak.  There was no evidence of narrowing of the pouch and the gastric sleeve looked tubular.  The scope was then withdrawn.  The esophagus was unremarkable and the patient tolerated the endoscopy without difficulty.  Alphonsa Overall, MD, [facility] Office phone:  [phone]

## 2019-04-12 LAB — CBC WITH DIFFERENTIAL/PLATELET
Abs Immature Granulocytes: 0.05 10*3/uL (ref 0.00–0.07)
Basophils Absolute: 0 10*3/uL (ref 0.0–0.1)
Basophils Relative: 0 %
Eosinophils Absolute: 0 10*3/uL (ref 0.0–0.5)
Eosinophils Relative: 0 %
HCT: 34 % — ABNORMAL LOW (ref 36.0–46.0)
Hemoglobin: 10.8 g/dL — ABNORMAL LOW (ref 12.0–15.0)
Immature Granulocytes: 0 %
Lymphocytes Relative: 7 %
Lymphs Abs: 0.8 10*3/uL (ref 0.7–4.0)
MCH: 28.6 pg (ref 26.0–34.0)
MCHC: 31.8 g/dL (ref 30.0–36.0)
MCV: 90.2 fL (ref 80.0–100.0)
Monocytes Absolute: 1 10*3/uL (ref 0.1–1.0)
Monocytes Relative: 8 %
Neutro Abs: 9.9 10*3/uL — ABNORMAL HIGH (ref 1.7–7.7)
Neutrophils Relative %: 85 %
Platelets: 155 10*3/uL (ref 150–400)
RBC: 3.77 MIL/uL — ABNORMAL LOW (ref 3.87–5.11)
RDW: 14.7 % (ref 11.5–15.5)
WBC: 11.8 10*3/uL — ABNORMAL HIGH (ref 4.0–10.5)
nRBC: 0 % (ref 0.0–0.2)

## 2019-04-12 LAB — SURGICAL PATHOLOGY

## 2019-04-12 MED ORDER — METOCLOPRAMIDE HCL 5 MG/ML IJ SOLN
5.0000 mg | Freq: Four times a day (QID) | INTRAMUSCULAR | Status: DC
  Administered 2019-04-12 – 2019-04-13 (×4): 5 mg via INTRAVENOUS
  Filled 2019-04-12 (×4): qty 2

## 2019-04-12 MED ORDER — METOCLOPRAMIDE HCL 5 MG/ML IJ SOLN
5.0000 mg | Freq: Once | INTRAMUSCULAR | Status: AC
  Administered 2019-04-12: 5 mg via INTRAVENOUS
  Filled 2019-04-12: qty 2

## 2019-04-12 NOTE — Progress Notes (Signed)
Discussed patient nausea with Dr Hassell Done.  Added dose of Reglan, will continue to monitor.

## 2019-04-12 NOTE — Progress Notes (Signed)
RN attempted to get patient out of bed to walk in hall. Patient stated that she will try when she doesn't feel sick. RN educated pt that ambulating can aide in recovery and that additional medication can be given after ambulation. Patient became mute and would not answer RN. Will attempt later.

## 2019-04-12 NOTE — Progress Notes (Signed)
Patient alert and oriented, Post op day 1.  Provided support and encouragement.  Encouraged pulmonary toilet, ambulation and small sips of liquids.  Completed 2 ounces of water, still has complaints of pain/nausea.  RN medicated.  Encouraged patient to walk in the hall today to help decrease gas discomfort. All questions answered.  Will continue to monitor.

## 2019-04-12 NOTE — Progress Notes (Signed)
Patient has finished the first 2 oz of water. Incentive spirometer demonstrated. Patient complains of persistent nausea and "feeling sick". Zofran given. Patient states her pain is a 5 all over and that she lives in pain but her main concern is feeling nauseated. Patient has walked to the restroom and has been educated on the need to ambulate more on the unit. Will attempt later.

## 2019-04-12 NOTE — Progress Notes (Signed)
Patient decreased nausea with dose of Reglan.  Will continue every 6 hours.  Discussed with patient and RN.  Encouraged patient to drink upright and mobilize at least 2 more times this afternoon and one more time before bed.

## 2019-04-13 LAB — CBC WITH DIFFERENTIAL/PLATELET
Abs Immature Granulocytes: 0.04 10*3/uL (ref 0.00–0.07)
Basophils Absolute: 0 10*3/uL (ref 0.0–0.1)
Basophils Relative: 0 %
Eosinophils Absolute: 0 10*3/uL (ref 0.0–0.5)
Eosinophils Relative: 0 %
HCT: 33.1 % — ABNORMAL LOW (ref 36.0–46.0)
Hemoglobin: 10.5 g/dL — ABNORMAL LOW (ref 12.0–15.0)
Immature Granulocytes: 1 %
Lymphocytes Relative: 15 %
Lymphs Abs: 1.1 10*3/uL (ref 0.7–4.0)
MCH: 28.8 pg (ref 26.0–34.0)
MCHC: 31.7 g/dL (ref 30.0–36.0)
MCV: 90.9 fL (ref 80.0–100.0)
Monocytes Absolute: 0.6 10*3/uL (ref 0.1–1.0)
Monocytes Relative: 8 %
Neutro Abs: 5.9 10*3/uL (ref 1.7–7.7)
Neutrophils Relative %: 76 %
Platelets: 139 10*3/uL — ABNORMAL LOW (ref 150–400)
RBC: 3.64 MIL/uL — ABNORMAL LOW (ref 3.87–5.11)
RDW: 14.8 % (ref 11.5–15.5)
WBC: 7.6 10*3/uL (ref 4.0–10.5)
nRBC: 0 % (ref 0.0–0.2)

## 2019-04-13 MED ORDER — OXYCODONE HCL 5 MG PO TABS: 5.0000 mg | ORAL_TABLET | ORAL | 0 refills | Status: DC | PRN

## 2019-04-13 MED ORDER — AMLODIPINE BESYLATE 5 MG PO TABS
5.0000 mg | ORAL_TABLET | Freq: Every day | ORAL | Status: DC
  Administered 2019-04-13: 5 mg via ORAL
  Filled 2019-04-13: qty 1

## 2019-04-13 MED ORDER — OXYCODONE HCL 5 MG PO TABS: 5.0000 mg | ORAL_TABLET | Freq: Four times a day (QID) | ORAL | 0 refills | Status: DC | PRN

## 2019-04-13 MED ORDER — IRBESARTAN 150 MG PO TABS
150.0000 mg | ORAL_TABLET | Freq: Every day | ORAL | Status: DC
  Administered 2019-04-13: 150 mg via ORAL
  Filled 2019-04-13: qty 1

## 2019-04-13 MED ORDER — OXYCODONE HCL 5 MG PO TABS: 5.0000 mg | ORAL_TABLET | ORAL | Status: DC | PRN

## 2019-04-13 NOTE — Progress Notes (Signed)
Patient alert and oriented, pain is controlled. Patient is tolerating fluids, advanced to protein shake today, patient is tolerating well.  Reviewed Gastric sleeve discharge instructions with patient and patient is able to articulate understanding.  Provided information on BELT program, Support Group and [facility] outpatient pharmacy. All questions answered, will continue to monitor.  Total fluid intake 400 Per dehydration protocol call back on Friday 4/2

## 2019-04-13 NOTE — Progress Notes (Signed)
Note from sister Pam to call with an update. Per patient, "please call Pam and tell her I don't feel good and I'll call her later". Call to [facility] with above message. Donne Hazel, RN

## 2019-04-13 NOTE — Progress Notes (Signed)
Patient alert and oriented, Post op day 2.  Provided support and encouragement.  Encouraged pulmonary toilet, ambulation and small sips of liquids.  Drinking water well, started protein shake this am.  All questions answered.  Will continue to monitor.

## 2019-04-13 NOTE — Plan of Care (Signed)

## 2019-04-13 NOTE — Progress Notes (Signed)
Patient's BP elevated. She has a PRN order for Lopressor but due to bradycardia with heart rate 56, this RN did not give. Dr Hassell Done notified and will address shortly. Donne Hazel, RN

## 2019-04-13 NOTE — Progress Notes (Signed)
Pt called 3E unit at [facility] with medication questions, stating she was discharged earlier in the day. This RN spoke with pt and informed her that her nurse had already left for the day and discharge instructions should be listed in the discharge packet. Pt states she needs to know when her last dose of pain medication and BP medication was given. RN pulled up her chart and gave the times and mg of last BP and pain meds given as listed in chart.

## 2019-04-13 NOTE — Discharge Summary (Signed)
Physician Discharge Summary  Patient ID: Megan [name] MRN: [id number] DOB/AGE: 56-24-1967 56 y.o.  PCP: [name], NP  Admit date: 04/11/2019 Discharge date: 04/13/2019  Admission Diagnoses:  Morbid obesity  Discharge Diagnoses:  same  Active Problems:   S/P laparoscopic sleeve gastrectomy   Surgery:  Lap sleeve gastrectomy hiatal hernia repair  Discharged Condition: improved  Hospital Course:   Had surgery.  Wasn't drinking enough for discharge on pd 1 and was kept until PD 2  Consults: none  Significant Diagnostic Studies: none    Discharge Exam: Blood pressure (!) 168/102, pulse (!) 57, temperature 98.8 F (37.1 C), temperature source Oral, resp. rate 19, height 5\' 7"  (1.702 m), weight (!) 140.2 kg, last menstrual period 04/10/2013, SpO2 99 %. Incisions ok  Disposition: Discharge disposition: 01-Home or Self Care       Discharge Instructions    Ambulate hourly while awake   Complete by: As directed    Ambulate hourly while awake   Complete by: As directed    Call MD for:  difficulty breathing, headache or visual disturbances   Complete by: As directed    Call MD for:  difficulty breathing, headache or visual disturbances   Complete by: As directed    Call MD for:  persistant dizziness or light-headedness   Complete by: As directed    Call MD for:  persistant dizziness or light-headedness   Complete by: As directed    Call MD for:  persistant nausea and vomiting   Complete by: As directed    Call MD for:  persistant nausea and vomiting   Complete by: As directed    Call MD for:  redness, tenderness, or signs of infection (pain, swelling, redness, odor or green/yellow discharge around incision site)   Complete by: As directed    Call MD for:  redness, tenderness, or signs of infection (pain, swelling, redness, odor or green/yellow discharge around incision site)   Complete by: As directed    Call MD for:  severe uncontrolled pain   Complete by:  As directed    Call MD for:  severe uncontrolled pain   Complete by: As directed    Call MD for:  temperature >101 F   Complete by: As directed    Call MD for:  temperature >101 F   Complete by: As directed    Diet bariatric full liquid   Complete by: As directed    Diet bariatric full liquid   Complete by: As directed    Incentive spirometry   Complete by: As directed    Perform hourly while awake   Incentive spirometry   Complete by: As directed    Perform hourly while awake     Allergies as of 04/13/2019      Reactions   Hydrocodone Anaphylaxis   Tolerates oxycodone   Penicillins Rash   Rash as a child, has not had since childhood, patient states,04/11/19 Throat swells up as well Did it involve swelling of the face/tongue/throat, SOB, or low BP? Yes Did it involve sudden or severe rash/hives, skin peeling, or any reaction on the inside of your mouth or nose? Yes Did you need to seek medical attention at a hospital or doctor's office? Yes When did it last happen?childhood reaction. If all above answers are "NO", may proceed with cephalosporin use.   Gadolinium Derivatives    Reactions have happened twice, after leaving facility. Pt reports feeling hot in trunk but extremities were icy cold; shivering, vomiting the  first time after receiving contrast.  Symptoms lasted 12-18 hours.     Chantix [varenicline] Nausea Only   Cymbalta [duloxetine Hcl]    headach   Glucosamine Forte [nutritional Supplements] Other (See Comments)   Elevated Blood Pressure   Morphine Other (See Comments)   REACTION: irregular heart beat   Codeine Rash      Medication List    STOP taking these medications   acetaminophen 325 MG tablet Commonly known as: TYLENOL     TAKE these medications   amLODipine-valsartan 5-160 MG tablet Commonly known as: EXFORGE TAKE 1 TABLET BY MOUTH EVERY DAY Notes to patient: Monitor Blood Pressure Daily and keep a log for primary care physician.  You may need  to make changes to your medications with rapid weight loss.     aspirin EC 81 MG tablet Take 81 mg by mouth daily.   Maxitrol 0.1 % Oint Generic drug: neomycin-polymyxin-dexameth Apply 1 Application Left Eye twice a day   metoprolol tartrate 25 MG tablet Commonly known as: LOPRESSOR TAKE 1 TABLET BY MOUTH TWICE A DAY Notes to patient: Monitor Blood Pressure Daily and keep a log for primary care physician.  You may need to make changes to your medications with rapid weight loss.     nicotine 7 mg/24hr patch Commonly known as: NICODERM CQ - dosed in mg/24 hr Place 7 mg onto the skin daily.   oxyCODONE 5 MG immediate release tablet Commonly known as: Oxy IR/ROXICODONE Take 1.5 mg by mouth every 6 (six) hours as needed for pain. What changed: Another medication with the same name was added. Make sure you understand how and when to take each.   oxyCODONE 5 MG immediate release tablet Commonly known as: Oxy IR/ROXICODONE Take 1 tablet (5 mg total) by mouth every 4 (four) hours as needed for severe pain. What changed: You were already taking a medication with the same name, and this prescription was added. Make sure you understand how and when to take each.   oxyCODONE 5 MG immediate release tablet Commonly known as: Oxy IR/ROXICODONE Take 1 tablet (5 mg total) by mouth every 6 (six) hours as needed for severe pain. What changed: You were already taking a medication with the same name, and this prescription was added. Make sure you understand how and when to take each.   Synthroid 175 MCG tablet Generic drug: levothyroxine TAKE 1 TABLET BY MOUTH DAILY BEFORE BREAKFAST. What changed:   how much to take  how to take this  when to take this  additional instructions   Systane Nighttime Oint Place 1 application into the right eye 3 (three) times daily as needed (Ocular prosthesis).   TobraDex ophthalmic ointment Generic drug: tobramycin-dexamethasone Place 1 application into  the left eye in the morning and at bedtime.      Follow-up Information    Johnathan Hausen, MD. Go on 05/06/2019.   Specialty: General Surgery Why: at 400 pm.  Please arrive 15 minutes prior to appointment time.  Thank you Contact information: [address] [place] [address] [phone]        Carlena Hurl, PA-C. Go on 06/02/2019.   Specialty: General Surgery Why: at 915 am Contact information: [place] [address] [phone]           Signed: Pedro Earls 04/13/2019, 9:12 PM

## 2019-04-18 ENCOUNTER — Telehealth (HOSPITAL_COMMUNITY): Payer: Self-pay

## 2019-04-18 NOTE — Telephone Encounter (Signed)
Patient called to discuss post bariatric surgery follow up questions.  See below: ° ° °1.  Tell me about your pain and pain management? ° °2.  Let's talk about fluid intake.  How much total fluid are you taking in? ° °3.  How much protein have you taken in the last 2 days? ° °4.  Have you had nausea?  Tell me about when have experienced nausea and what you did to help? ° °5.  Has the frequency or color changed with your urine? ° °6.  Tell me what your incisions look like? ° °7.  Have you been passing gas? BM? ° °8.  If a problem or question were to arise who would you call?  Do you know contact numbers for [facility], [facility], and [facility]? ° °9.  How has the walking going? ° °10.  How are your vitamins and calcium going?  How are you taking them? °

## 2019-04-22 ENCOUNTER — Other Ambulatory Visit: Payer: Self-pay | Admitting: Family

## 2019-04-26 ENCOUNTER — Encounter: Payer: Medicare Other | Attending: Surgery | Admitting: Skilled Nursing Facility1

## 2019-04-26 ENCOUNTER — Ambulatory Visit: Payer: Medicare Other | Admitting: Family

## 2019-04-26 ENCOUNTER — Other Ambulatory Visit: Payer: Self-pay

## 2019-04-26 DIAGNOSIS — Z79899 Other long term (current) drug therapy: Secondary | ICD-10-CM | POA: Insufficient documentation

## 2019-04-26 DIAGNOSIS — Z713 Dietary counseling and surveillance: Secondary | ICD-10-CM | POA: Insufficient documentation

## 2019-04-26 DIAGNOSIS — Z833 Family history of diabetes mellitus: Secondary | ICD-10-CM | POA: Insufficient documentation

## 2019-04-26 DIAGNOSIS — Z7982 Long term (current) use of aspirin: Secondary | ICD-10-CM | POA: Insufficient documentation

## 2019-04-26 DIAGNOSIS — Z823 Family history of stroke: Secondary | ICD-10-CM | POA: Diagnosis not present

## 2019-04-26 DIAGNOSIS — Z6841 Body Mass Index (BMI) 40.0 and over, adult: Secondary | ICD-10-CM | POA: Insufficient documentation

## 2019-04-26 DIAGNOSIS — Z79890 Hormone replacement therapy (postmenopausal): Secondary | ICD-10-CM | POA: Diagnosis not present

## 2019-04-26 DIAGNOSIS — Z885 Allergy status to narcotic agent status: Secondary | ICD-10-CM | POA: Insufficient documentation

## 2019-04-26 DIAGNOSIS — I10 Essential (primary) hypertension: Secondary | ICD-10-CM | POA: Diagnosis not present

## 2019-04-26 DIAGNOSIS — Z8349 Family history of other endocrine, nutritional and metabolic diseases: Secondary | ICD-10-CM | POA: Diagnosis not present

## 2019-04-26 DIAGNOSIS — Z8249 Family history of ischemic heart disease and other diseases of the circulatory system: Secondary | ICD-10-CM | POA: Insufficient documentation

## 2019-04-26 DIAGNOSIS — E669 Obesity, unspecified: Secondary | ICD-10-CM

## 2019-04-26 DIAGNOSIS — Z880 Allergy status to penicillin: Secondary | ICD-10-CM | POA: Insufficient documentation

## 2019-04-26 DIAGNOSIS — E079 Disorder of thyroid, unspecified: Secondary | ICD-10-CM | POA: Insufficient documentation

## 2019-04-26 DIAGNOSIS — F17210 Nicotine dependence, cigarettes, uncomplicated: Secondary | ICD-10-CM | POA: Diagnosis not present

## 2019-04-27 NOTE — Progress Notes (Signed)
2 Week Post-Operative Nutrition Class   Patient was seen on 03/09/18 for Post-Operative Nutrition education at the [facility].    Surgery date: 04/11/2019 Surgery type: sleeve Start weight at [facility]: 313 Weight today: 296.1   Body Composition Scale Declined  Total Body Fat %   Visceral Fat   Fat-Free Mass %    Total Body Water %    Muscle-Mass lbs   Body Fat Displacement          Torso  lbs          Left Leg  lbs          Right Leg  lbs          Left Arm  lbs          Right Arm   lbs      The following the learning objectives were met by the patient during this course:  Identifies Phase 3 (Soft, High Proteins) Dietary Goals and will begin from 2 weeks post-operatively to 2 months post-operatively  Identifies appropriate sources of fluids and proteins   States protein recommendations and appropriate sources post-operatively  Identifies the need for appropriate texture modifications, mastication, and bite sizes when consuming solids  Identifies appropriate multivitamin and calcium sources post-operatively  Describes the need for physical activity post-operatively and will follow MD recommendations  States when to call healthcare provider regarding medication questions or post-operative complications   Handouts given during class include:  Phase 3A: Soft, High Protein Diet Handout   Follow-Up Plan: Patient will follow-up at [facility] in 6 weeks for 2 month post-op nutrition visit for diet advancement per MD.

## 2019-05-02 ENCOUNTER — Telehealth: Payer: Self-pay | Admitting: Skilled Nursing Facility1

## 2019-05-02 NOTE — Telephone Encounter (Signed)
RD called pt to verify fluid intake once starting soft, solid proteins 2 week post-bariatric surgery.   Daily Fluid intake: 56+ Daily Protein intake: 60+  Concerns/issues:   Pt states she thinks spices make her sick. Pt states she will try meatless crumbles. Pt states she is constipated and is using mirilax.

## 2019-05-04 ENCOUNTER — Other Ambulatory Visit: Payer: Self-pay | Admitting: Family

## 2019-05-05 DIAGNOSIS — H00022 Hordeolum internum right lower eyelid: Secondary | ICD-10-CM | POA: Diagnosis not present

## 2019-05-05 DIAGNOSIS — H00032 Abscess of right lower eyelid: Secondary | ICD-10-CM | POA: Diagnosis not present

## 2019-05-09 DIAGNOSIS — M47812 Spondylosis without myelopathy or radiculopathy, cervical region: Secondary | ICD-10-CM | POA: Diagnosis not present

## 2019-05-09 DIAGNOSIS — G894 Chronic pain syndrome: Secondary | ICD-10-CM | POA: Diagnosis not present

## 2019-05-09 DIAGNOSIS — M47816 Spondylosis without myelopathy or radiculopathy, lumbar region: Secondary | ICD-10-CM | POA: Diagnosis not present

## 2019-05-09 DIAGNOSIS — M961 Postlaminectomy syndrome, not elsewhere classified: Secondary | ICD-10-CM | POA: Diagnosis not present

## 2019-05-10 ENCOUNTER — Other Ambulatory Visit: Payer: Self-pay

## 2019-05-10 ENCOUNTER — Ambulatory Visit (INDEPENDENT_AMBULATORY_CARE_PROVIDER_SITE_OTHER): Payer: Medicare Other | Admitting: Family

## 2019-05-10 ENCOUNTER — Encounter: Payer: Self-pay | Admitting: Family

## 2019-05-10 VITALS — BP 106/76 | HR 71 | Temp 97.9°F | Resp 16 | Ht 66.0 in | Wt 288.0 lb

## 2019-05-10 DIAGNOSIS — E039 Hypothyroidism, unspecified: Secondary | ICD-10-CM | POA: Diagnosis not present

## 2019-05-10 DIAGNOSIS — K754 Autoimmune hepatitis: Secondary | ICD-10-CM | POA: Diagnosis not present

## 2019-05-10 MED ORDER — METOPROLOL TARTRATE 25 MG PO TABS: ORAL_TABLET | ORAL | 1 refills | Status: DC

## 2019-05-10 NOTE — Patient Instructions (Signed)
Change metoprolol 25mg  to 1/2 tab twice daily.
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
## 2019-05-16 ENCOUNTER — Other Ambulatory Visit: Payer: Self-pay | Admitting: Family

## 2019-05-23 ENCOUNTER — Ambulatory Visit: Payer: Medicare Other | Admitting: Family

## 2019-05-30 ENCOUNTER — Other Ambulatory Visit: Payer: Self-pay

## 2019-05-30 ENCOUNTER — Ambulatory Visit (INDEPENDENT_AMBULATORY_CARE_PROVIDER_SITE_OTHER): Payer: Medicare Other | Admitting: Family

## 2019-05-30 ENCOUNTER — Encounter: Payer: Self-pay | Admitting: Family

## 2019-05-30 VITALS — BP 104/73 | HR 96 | Temp 98.2°F | Resp 16 | Wt 272.0 lb

## 2019-05-30 DIAGNOSIS — K921 Melena: Secondary | ICD-10-CM

## 2019-05-30 DIAGNOSIS — E039 Hypothyroidism, unspecified: Secondary | ICD-10-CM

## 2019-05-30 DIAGNOSIS — I10 Essential (primary) hypertension: Secondary | ICD-10-CM

## 2019-05-30 DIAGNOSIS — E1159 Type 2 diabetes mellitus with other circulatory complications: Secondary | ICD-10-CM | POA: Diagnosis not present

## 2019-05-30 DIAGNOSIS — K625 Hemorrhage of anus and rectum: Secondary | ICD-10-CM

## 2019-05-30 DIAGNOSIS — I152 Hypertension secondary to endocrine disorders: Secondary | ICD-10-CM

## 2019-05-30 LAB — CBC WITH DIFFERENTIAL/PLATELET
Basophils Absolute: 0 10*3/uL (ref 0.0–0.1)
Basophils Relative: 0.6 % (ref 0.0–3.0)
Eosinophils Absolute: 0.2 10*3/uL (ref 0.0–0.7)
Eosinophils Relative: 2.5 % (ref 0.0–5.0)
HCT: 36.6 % (ref 36.0–46.0)
Hemoglobin: 11.9 g/dL — ABNORMAL LOW (ref 12.0–15.0)
Lymphocytes Relative: 15.6 % (ref 12.0–46.0)
Lymphs Abs: 1 10*3/uL (ref 0.7–4.0)
MCHC: 32.5 g/dL (ref 30.0–36.0)
MCV: 80.8 fl (ref 78.0–100.0)
Monocytes Absolute: 0.4 10*3/uL (ref 0.1–1.0)
Monocytes Relative: 7.2 % (ref 3.0–12.0)
Neutro Abs: 4.6 10*3/uL (ref 1.4–7.7)
Neutrophils Relative %: 74.1 % (ref 43.0–77.0)
Platelets: 170 10*3/uL (ref 150.0–400.0)
RBC: 4.53 Mil/uL (ref 3.87–5.11)
RDW: 15.9 % — ABNORMAL HIGH (ref 11.5–15.5)
WBC: 6.1 10*3/uL (ref 4.0–10.5)

## 2019-05-30 LAB — BASIC METABOLIC PANEL
BUN: 17 mg/dL (ref 6–23)
CO2: 29 mEq/L (ref 19–32)
Calcium: 8.9 mg/dL (ref 8.4–10.5)
Chloride: 104 mEq/L (ref 96–112)
Creatinine, Ser: 0.56 mg/dL (ref 0.40–1.20)
GFR: 111.91 mL/min (ref >60.00)
Glucose, Bld: 114 mg/dL — ABNORMAL HIGH (ref 70–99)
Potassium: 4.1 mEq/L (ref 3.5–5.1)
Sodium: 138 mEq/L (ref 135–145)

## 2019-05-30 MED ORDER — SYNTHROID 175 MCG PO TABS: 175.0000 ug | ORAL_TABLET | Freq: Every day | ORAL | 1 refills | Status: DC

## 2019-05-30 MED ORDER — METOPROLOL SUCCINATE ER 25 MG PO TB24: 25.0000 mg | ORAL_TABLET | Freq: Every day | ORAL | 2 refills | Status: DC
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
## 2019-05-30 NOTE — Patient Instructions (Signed)
Change your metoprolol  toprol xl to 25mg  once daily.

## 2019-05-31 LAB — TSH: TSH: 0.14 u[IU]/mL — ABNORMAL LOW (ref 0.35–4.50)

## 2019-06-01 ENCOUNTER — Telehealth: Payer: Self-pay | Admitting: Family

## 2019-06-01 MED ORDER — SYNTHROID 150 MCG PO TABS: 150.0000 ug | ORAL_TABLET | Freq: Every day | ORAL | 5 refills | Status: DC

## 2019-06-01 NOTE — Telephone Encounter (Signed)
Please advise pt that lab work shows that we need to decrease her synthroid. I have sent 158mcg to her pharmacy.  Repeat tsh in 6 weeks, dx hypothyroid.

## 2019-06-01 NOTE — Telephone Encounter (Signed)
Results and provider's advised given to patient. 4 weeks follow up moved up to 6 weeks at patient's request so she can come in one time in June for ov and labs.

## 2019-06-07 ENCOUNTER — Other Ambulatory Visit: Payer: Self-pay

## 2019-06-07 ENCOUNTER — Encounter: Payer: Self-pay | Admitting: Dietician

## 2019-06-07 ENCOUNTER — Encounter: Payer: Medicare Other | Attending: Surgery | Admitting: Dietician

## 2019-06-07 DIAGNOSIS — Z823 Family history of stroke: Secondary | ICD-10-CM | POA: Diagnosis not present

## 2019-06-07 DIAGNOSIS — E079 Disorder of thyroid, unspecified: Secondary | ICD-10-CM | POA: Diagnosis not present

## 2019-06-07 DIAGNOSIS — Z79899 Other long term (current) drug therapy: Secondary | ICD-10-CM | POA: Diagnosis not present

## 2019-06-07 DIAGNOSIS — Z7982 Long term (current) use of aspirin: Secondary | ICD-10-CM | POA: Diagnosis not present

## 2019-06-07 DIAGNOSIS — Z6841 Body Mass Index (BMI) 40.0 and over, adult: Secondary | ICD-10-CM | POA: Insufficient documentation

## 2019-06-07 DIAGNOSIS — I10 Essential (primary) hypertension: Secondary | ICD-10-CM | POA: Diagnosis not present

## 2019-06-07 DIAGNOSIS — Z713 Dietary counseling and surveillance: Secondary | ICD-10-CM | POA: Diagnosis not present

## 2019-06-07 DIAGNOSIS — Z833 Family history of diabetes mellitus: Secondary | ICD-10-CM | POA: Diagnosis not present

## 2019-06-07 DIAGNOSIS — Z880 Allergy status to penicillin: Secondary | ICD-10-CM | POA: Insufficient documentation

## 2019-06-07 DIAGNOSIS — E669 Obesity, unspecified: Secondary | ICD-10-CM

## 2019-06-07 DIAGNOSIS — Z885 Allergy status to narcotic agent status: Secondary | ICD-10-CM | POA: Insufficient documentation

## 2019-06-07 DIAGNOSIS — F17210 Nicotine dependence, cigarettes, uncomplicated: Secondary | ICD-10-CM | POA: Diagnosis not present

## 2019-06-07 DIAGNOSIS — Z8349 Family history of other endocrine, nutritional and metabolic diseases: Secondary | ICD-10-CM | POA: Insufficient documentation

## 2019-06-07 DIAGNOSIS — Z79890 Hormone replacement therapy (postmenopausal): Secondary | ICD-10-CM | POA: Insufficient documentation

## 2019-06-07 DIAGNOSIS — Z8249 Family history of ischemic heart disease and other diseases of the circulatory system: Secondary | ICD-10-CM | POA: Insufficient documentation

## 2019-06-07 NOTE — Progress Notes (Signed)
Bariatric Nutrition Follow-Up Visit Medical Nutrition Therapy  Appt Start Time: 2:00pm    End Time: 2:30pm  2 Months Post-Operative Sleeve Surgery Surgery Date: 04/11/2019  Pt's Expectations of Surgery/ Goals: to correct gastroparesis, joint pain, and other comorbidities; to come off medications; to live longer and be better able to care for grandchildren  Pt Reported Successes: less nausea, weight loss    NUTRITION ASSESSMENT  Anthropometrics  Start weight at [facility]: 313 lbs (date: 01/26/2019) Today's weight: 274.2 lbs  Body Composition Scale 06/07/2019  Weight  lbs 274.2  BMI 44.2  Total Body Fat  % 47.4     Visceral Fat 19  Fat-Free Mass  % 52.5     Total Body Water  % 40.7     Muscle-Mass  lbs 32  Body Fat Displacement ---         Torso  lbs 80.6         Left Leg  lbs 16.1         Right Leg  lbs 16.1         Left Arm  lbs 8         Right Arm  lbs 8    Lifestyle & Dietary Hx Patient states she is still experiencing tiredness, and thinks this may be related to thyroid disease. States she is having "trouble with foods," specifically having an appetite for the protein foods specifically. States she doesn't have the desire to eat because she doesn't have the taste for meat. Able to have cream of chicken soup, beans, eggs sometimes, and yogurt. May snack on sugar-free jello. States she grazes throughout the day and her "meals" or "snacks" are only a few bites.   24-Hr Dietary Recall First Meal: protein shake  Snack: egg  Second Meal: [place] yogurt Snack: -  Third Meal: beans Snack: sugar-free jello  Beverages: water, Protein water  Estimated daily fluid intake: 50-80 oz Estimated daily protein intake: ~60 g Supplements: bariatric MVI, calcium Current average weekly physical activity: walking   Post-Op Goals/ Signs/ Symptoms Using straws: no Drinking while eating: no Chewing/swallowing difficulties: no Changes in vision: no Changes to mood/headaches: no Hair loss/changes  to skin/nails: no Difficulty focusing/concentrating: some, including tiredness  Sweating: no Dizziness/lightheadedness: no (no longer a problem since decreasing/coming off of meds)  Palpitations: no  Carbonated/caffeinated beverages: no N/V/D/C/Gas: constipation  Abdominal pain: no Dumping syndrome: no   NUTRITION DIAGNOSIS  Overweight/obesity (Ruby-3.3) related to past poor dietary habits and physical inactivity as evidenced by completed bariatric surgery and following dietary guidelines for continued weight loss and healthy nutrition status.   NUTRITION INTERVENTION Nutrition counseling (C-1) and education (E-2) to facilitate bariatric surgery goals, including: . Diet advancement to the next phase (phase 4) now including non-starchy vegetables  . The importance of consuming adequate calories as well as certain nutrients daily due to the body's need for essential vitamins, minerals, and fats . The importance of daily physical activity and to reach a goal of at least 150 minutes of moderate to vigorous physical activity weekly (or as directed by their physician) due to benefits such as increased musculature and improved lab values  Handouts Provided Include   Phase 4: Protein + Non-Starchy Vegetables   Learning Style & Readiness for Change Teaching method utilized: Visual & Auditory  Demonstrated degree of understanding via: Teach Back  Barriers to learning/adherence to lifestyle change: None Identified    MONITORING & EVALUATION Dietary intake, weekly physical activity, body weight, and goals in 4  months.  Next Steps Patient is to follow-up in 4 months for 6 month post-op follow-up.

## 2019-06-07 NOTE — Patient Instructions (Signed)

## 2019-06-09 DIAGNOSIS — M961 Postlaminectomy syndrome, not elsewhere classified: Secondary | ICD-10-CM | POA: Diagnosis not present

## 2019-06-09 DIAGNOSIS — G894 Chronic pain syndrome: Secondary | ICD-10-CM | POA: Diagnosis not present

## 2019-06-09 DIAGNOSIS — M47816 Spondylosis without myelopathy or radiculopathy, lumbar region: Secondary | ICD-10-CM | POA: Diagnosis not present

## 2019-06-09 DIAGNOSIS — M47812 Spondylosis without myelopathy or radiculopathy, cervical region: Secondary | ICD-10-CM | POA: Diagnosis not present

## 2019-06-20 ENCOUNTER — Other Ambulatory Visit: Payer: Self-pay | Admitting: Family

## 2019-06-20 DIAGNOSIS — Z1231 Encounter for screening mammogram for malignant neoplasm of breast: Secondary | ICD-10-CM

## 2019-06-28 ENCOUNTER — Ambulatory Visit
Admission: RE | Admit: 2019-06-28 | Discharge: 2019-06-28 | Disposition: A | Discharge status: Home / Self Care | Payer: Medicare Other | Source: Ambulatory Visit | Attending: Family | Admitting: Family

## 2019-06-28 ENCOUNTER — Other Ambulatory Visit: Payer: Self-pay

## 2019-06-28 ENCOUNTER — Ambulatory Visit: Payer: Medicare Other | Admitting: Family

## 2019-06-28 DIAGNOSIS — Z1231 Encounter for screening mammogram for malignant neoplasm of breast: Secondary | ICD-10-CM | POA: Diagnosis not present

## 2019-07-04 DIAGNOSIS — K7469 Other cirrhosis of liver: Secondary | ICD-10-CM | POA: Diagnosis not present

## 2019-07-04 DIAGNOSIS — K754 Autoimmune hepatitis: Secondary | ICD-10-CM | POA: Diagnosis not present

## 2019-07-04 DIAGNOSIS — K760 Fatty (change of) liver, not elsewhere classified: Secondary | ICD-10-CM | POA: Diagnosis not present

## 2019-07-05 ENCOUNTER — Other Ambulatory Visit: Payer: Self-pay | Admitting: Nurse Practitioner

## 2019-07-05 DIAGNOSIS — K7460 Unspecified cirrhosis of liver: Secondary | ICD-10-CM

## 2019-07-07 ENCOUNTER — Ambulatory Visit: Payer: Medicare Other | Attending: Internal Medicine

## 2019-07-07 DIAGNOSIS — Z23 Encounter for immunization: Secondary | ICD-10-CM

## 2019-07-07 NOTE — Progress Notes (Signed)
   Covid-19 Vaccination Clinic  Name:  KORTNIE STOVALL    MRN: [id number] DOB: [birth year]  07/07/2019  Ms. Tawil was observed post Covid-19 immunization for 15 minutes without incident. She was provided with Vaccine Information Sheet and instruction to access the V-Safe system.   Ms. Furber was instructed to call 911 with any severe reactions post vaccine: Marland Kitchen Difficulty breathing  . Swelling of face and throat  . A fast heartbeat  . A bad rash all over body  . Dizziness and weakness   Immunizations Administered    Name Date Dose VIS Date Route   Pfizer COVID-19 Vaccine 07/07/2019  2:17 PM 0.3 mL 03/09/2018 Intramuscular   Manufacturer: [employer], [employer]   Lot: [id number]   [place]: [id number]

## 2019-07-12 ENCOUNTER — Telehealth: Payer: Self-pay | Admitting: Gastroenterology

## 2019-07-12 ENCOUNTER — Other Ambulatory Visit: Payer: Self-pay

## 2019-07-12 ENCOUNTER — Encounter: Payer: Self-pay | Admitting: Family

## 2019-07-12 ENCOUNTER — Ambulatory Visit (INDEPENDENT_AMBULATORY_CARE_PROVIDER_SITE_OTHER): Payer: Medicare Other | Admitting: Family

## 2019-07-12 VITALS — BP 116/72 | HR 67 | Temp 97.8°F | Resp 16 | Ht 66.0 in | Wt 248.0 lb

## 2019-07-12 DIAGNOSIS — E039 Hypothyroidism, unspecified: Secondary | ICD-10-CM | POA: Diagnosis not present

## 2019-07-12 DIAGNOSIS — K754 Autoimmune hepatitis: Secondary | ICD-10-CM

## 2019-07-12 DIAGNOSIS — E669 Obesity, unspecified: Secondary | ICD-10-CM

## 2019-07-12 DIAGNOSIS — I10 Essential (primary) hypertension: Secondary | ICD-10-CM | POA: Diagnosis not present

## 2019-07-12 DIAGNOSIS — K7469 Other cirrhosis of liver: Secondary | ICD-10-CM | POA: Diagnosis not present

## 2019-07-12 LAB — TSH: TSH: 0.04 u[IU]/mL — ABNORMAL LOW (ref 0.35–4.50)

## 2019-07-12 MED ORDER — METOPROLOL SUCCINATE ER 25 MG PO TB24: 25.0000 mg | ORAL_TABLET | Freq: Every day | ORAL | Status: DC
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
## 2019-07-12 NOTE — Patient Instructions (Signed)
Please complete lab work prior to leaving.   

## 2019-07-12 NOTE — Telephone Encounter (Signed)
Yes, of course, if that is her preference  - HD 

## 2019-07-13 DIAGNOSIS — M47812 Spondylosis without myelopathy or radiculopathy, cervical region: Secondary | ICD-10-CM | POA: Diagnosis not present

## 2019-07-13 DIAGNOSIS — M961 Postlaminectomy syndrome, not elsewhere classified: Secondary | ICD-10-CM | POA: Diagnosis not present

## 2019-07-13 DIAGNOSIS — M47816 Spondylosis without myelopathy or radiculopathy, lumbar region: Secondary | ICD-10-CM | POA: Diagnosis not present

## 2019-07-13 DIAGNOSIS — G894 Chronic pain syndrome: Secondary | ICD-10-CM | POA: Diagnosis not present

## 2019-07-14 ENCOUNTER — Telehealth: Payer: Self-pay | Admitting: Family

## 2019-07-14 MED ORDER — LEVOTHYROXINE SODIUM 125 MCG PO TABS: 125.0000 ug | ORAL_TABLET | Freq: Every day | ORAL | 1 refills | Status: DC

## 2019-07-14 NOTE — Telephone Encounter (Signed)
Dr. Cirigliano, Will you accept this patient? °

## 2019-07-14 NOTE — Telephone Encounter (Signed)
Please advise pt that her synthroid should be reduced to 151mcg once daily.  Repeat tsh in 6 weeks.

## 2019-07-15 ENCOUNTER — Other Ambulatory Visit: Payer: Self-pay

## 2019-07-15 DIAGNOSIS — E039 Hypothyroidism, unspecified: Secondary | ICD-10-CM

## 2019-07-15 NOTE — Telephone Encounter (Signed)
Patient advised of results and new dose. She was scheduled for labs 09-01-2019

## 2019-07-19 ENCOUNTER — Encounter: Payer: Self-pay | Admitting: Gastroenterology

## 2019-07-19 NOTE — Telephone Encounter (Signed)
LM on vmail to call back to schedule an appt with Dr. Bryan Lemma

## 2019-07-19 NOTE — Telephone Encounter (Signed)
Absolutely, would be happy to see her. Thanks.

## 2019-07-20 DIAGNOSIS — H00022 Hordeolum internum right lower eyelid: Secondary | ICD-10-CM | POA: Diagnosis not present

## 2019-07-20 DIAGNOSIS — H0102B Squamous blepharitis left eye, upper and lower eyelids: Secondary | ICD-10-CM | POA: Diagnosis not present

## 2019-07-20 DIAGNOSIS — H0102A Squamous blepharitis right eye, upper and lower eyelids: Secondary | ICD-10-CM | POA: Diagnosis not present

## 2019-07-21 ENCOUNTER — Other Ambulatory Visit: Payer: Medicare Other

## 2019-07-28 ENCOUNTER — Ambulatory Visit
Admission: RE | Admit: 2019-07-28 | Discharge: 2019-07-28 | Disposition: A | Discharge status: Home / Self Care | Payer: Medicare Other | Source: Ambulatory Visit | Attending: Nurse Practitioner | Admitting: Nurse Practitioner

## 2019-07-28 ENCOUNTER — Ambulatory Visit: Payer: Medicare Other | Attending: Internal Medicine

## 2019-07-28 ENCOUNTER — Telehealth: Payer: Self-pay | Admitting: Family

## 2019-07-28 DIAGNOSIS — K7460 Unspecified cirrhosis of liver: Secondary | ICD-10-CM | POA: Diagnosis not present

## 2019-07-28 DIAGNOSIS — Q600 Renal agenesis, unilateral: Secondary | ICD-10-CM | POA: Diagnosis not present

## 2019-07-28 DIAGNOSIS — Z23 Encounter for immunization: Secondary | ICD-10-CM

## 2019-07-28 DIAGNOSIS — K838 Other specified diseases of biliary tract: Secondary | ICD-10-CM | POA: Diagnosis not present

## 2019-07-28 DIAGNOSIS — K7689 Other specified diseases of liver: Secondary | ICD-10-CM | POA: Diagnosis not present

## 2019-07-28 NOTE — Telephone Encounter (Signed)
I don't see that she has had hep B either. Let's have her come in for Twinrix (HepA + Hep B). She should wait at least 2 weeks from her second covid shot.  Schedule is now, 1 month and 6 months.

## 2019-07-28 NOTE — Progress Notes (Signed)
   Covid-19 Vaccination Clinic  Name:  Megan [name]    MRN: [id number] DOB: 03-Feb-1965  07/28/2019  Megan [name] was observed post Covid-19 immunization for 30 minutes based on pre-vaccination screening without incident. She was provided with Vaccine Information Sheet and instruction to access the V-Safe system.   Megan [name] was instructed to call 911 with any severe reactions post vaccine: Marland Kitchen Difficulty breathing  . Swelling of face and throat  . A fast heartbeat  . A bad rash all over body  . Dizziness and weakness   Immunizations Administered    Name Date Dose VIS Date Route   Pfizer COVID-19 Vaccine 07/28/2019  1:44 PM 0.3 mL 03/09/2018 Intramuscular   Manufacturer: [employer], [employer]   Lot: [id number]   [place]: [address]-3

## 2019-07-28 NOTE — Telephone Encounter (Signed)
Caller Shane Crutch Call Back # [phone]  Patient states that she needs a HEP A vaccine due to her liver disease.  Patient had her Second Covid Vaccine today [phone].

## 2019-07-29 NOTE — Telephone Encounter (Signed)
Per patient she will wait until her next appointment here on 09-13-2019 to have injection

## 2019-07-29 NOTE — Telephone Encounter (Signed)
Per patient liver specialist said she has Hep b immunity and just needs Hep A.

## 2019-08-05 ENCOUNTER — Other Ambulatory Visit: Payer: Self-pay

## 2019-08-05 ENCOUNTER — Other Ambulatory Visit: Payer: Self-pay | Admitting: Family

## 2019-08-05 ENCOUNTER — Telehealth (INDEPENDENT_AMBULATORY_CARE_PROVIDER_SITE_OTHER): Payer: Medicare Other | Admitting: Medical

## 2019-08-05 DIAGNOSIS — J029 Acute pharyngitis, unspecified: Secondary | ICD-10-CM | POA: Diagnosis not present

## 2019-08-05 DIAGNOSIS — J3489 Other specified disorders of nose and nasal sinuses: Secondary | ICD-10-CM

## 2019-08-05 MED ORDER — BENZONATATE 100 MG PO CAPS: 100.0000 mg | ORAL_CAPSULE | Freq: Three times a day (TID) | ORAL | 0 refills | Status: DC | PRN

## 2019-08-05 MED ORDER — AZITHROMYCIN 250 MG PO TABS: ORAL_TABLET | ORAL | 0 refills | Status: DC

## 2019-08-05 NOTE — Patient Instructions (Addendum)
3 days of recent nasal congestion,  sinus pressure ,sore throat, subjective fever and faint achiness.  Symptoms started after you got over side effect from Covid vaccine.  During the interim your exposure to both grand children who have been sick.  Both with sore throat.  The rapid strep test was negative but this does introduce the possibility of strep throat.   We will prescribe azithromycin antibiotic and benzonatate for cough.  You also mentions some medicine nasal congestion with clear nasal drainage at times blood-tinged.  You could use Afrin very briefly but only for 2 days.  If you use for more days could get rebound nasal congestion.  With your liver history recommend ED evaluation if you had severe constant nosebleed.  Recommend that you stay at home and rest for the next 3 days or so.  Very unlikely that you had Covid since you have been fully vaccinated but not impossible.  So if your signs/symptoms worsen or change might recommend getting tested.  Please give [place] an update on how you are doing Monday afternoon or Tuesday morning.
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
## 2019-08-11 DIAGNOSIS — M961 Postlaminectomy syndrome, not elsewhere classified: Secondary | ICD-10-CM | POA: Diagnosis not present

## 2019-08-11 DIAGNOSIS — M47812 Spondylosis without myelopathy or radiculopathy, cervical region: Secondary | ICD-10-CM | POA: Diagnosis not present

## 2019-08-11 DIAGNOSIS — M47816 Spondylosis without myelopathy or radiculopathy, lumbar region: Secondary | ICD-10-CM | POA: Diagnosis not present

## 2019-08-11 DIAGNOSIS — G894 Chronic pain syndrome: Secondary | ICD-10-CM | POA: Diagnosis not present

## 2019-08-18 ENCOUNTER — Encounter: Payer: Self-pay | Admitting: Family

## 2019-08-18 DIAGNOSIS — H0102A Squamous blepharitis right eye, upper and lower eyelids: Secondary | ICD-10-CM | POA: Diagnosis not present

## 2019-08-18 DIAGNOSIS — R7309 Other abnormal glucose: Secondary | ICD-10-CM | POA: Diagnosis not present

## 2019-08-18 DIAGNOSIS — H35412 Lattice degeneration of retina, left eye: Secondary | ICD-10-CM | POA: Diagnosis not present

## 2019-08-18 DIAGNOSIS — H35362 Drusen (degenerative) of macula, left eye: Secondary | ICD-10-CM | POA: Diagnosis not present

## 2019-08-18 LAB — HM DIABETES EYE EXAM

## 2019-08-22 ENCOUNTER — Other Ambulatory Visit: Payer: Self-pay

## 2019-08-22 ENCOUNTER — Ambulatory Visit (INDEPENDENT_AMBULATORY_CARE_PROVIDER_SITE_OTHER): Payer: Medicare Other | Admitting: Internal Medicine

## 2019-08-22 ENCOUNTER — Encounter: Payer: Self-pay | Admitting: Internal Medicine

## 2019-08-22 VITALS — BP 101/74 | HR 68 | Temp 98.2°F | Resp 12 | Ht 67.0 in | Wt 231.4 lb

## 2019-08-22 DIAGNOSIS — M25531 Pain in right wrist: Secondary | ICD-10-CM

## 2019-08-22 DIAGNOSIS — M25431 Effusion, right wrist: Secondary | ICD-10-CM

## 2019-08-22 DIAGNOSIS — E039 Hypothyroidism, unspecified: Secondary | ICD-10-CM

## 2019-08-22 NOTE — Patient Instructions (Signed)
Make an appointment for blood work tomorrow  Go to the first floor and get your x-rays  We are referring you to a hand surgeon
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
## 2019-08-23 ENCOUNTER — Other Ambulatory Visit (INDEPENDENT_AMBULATORY_CARE_PROVIDER_SITE_OTHER): Payer: Medicare Other

## 2019-08-23 ENCOUNTER — Ambulatory Visit (HOSPITAL_BASED_OUTPATIENT_CLINIC_OR_DEPARTMENT_OTHER)
Admission: RE | Admit: 2019-08-23 | Discharge: 2019-08-23 | Disposition: A | Discharge status: Home / Self Care | Payer: Medicare Other | Source: Ambulatory Visit | Attending: Internal Medicine | Admitting: Internal Medicine

## 2019-08-23 DIAGNOSIS — M25531 Pain in right wrist: Secondary | ICD-10-CM | POA: Diagnosis not present

## 2019-08-23 DIAGNOSIS — M19031 Primary osteoarthritis, right wrist: Secondary | ICD-10-CM | POA: Diagnosis not present

## 2019-08-23 DIAGNOSIS — M7989 Other specified soft tissue disorders: Secondary | ICD-10-CM | POA: Diagnosis not present

## 2019-08-23 LAB — CBC WITH DIFFERENTIAL/PLATELET
Basophils Absolute: 0 10*3/uL (ref 0.0–0.1)
Basophils Relative: 0.6 % (ref 0.0–3.0)
Eosinophils Absolute: 0.2 10*3/uL (ref 0.0–0.7)
Eosinophils Relative: 3.4 % (ref 0.0–5.0)
HCT: 38.5 % (ref 36.0–46.0)
Hemoglobin: 12.7 g/dL (ref 12.0–15.0)
Lymphocytes Relative: 20.7 % (ref 12.0–46.0)
Lymphs Abs: 1.1 10*3/uL (ref 0.7–4.0)
MCHC: 32.9 g/dL (ref 30.0–36.0)
MCV: 79.5 fl (ref 78.0–100.0)
Monocytes Absolute: 0.4 10*3/uL (ref 0.1–1.0)
Monocytes Relative: 8.2 % (ref 3.0–12.0)
Neutro Abs: 3.7 10*3/uL (ref 1.4–7.7)
Neutrophils Relative %: 67.1 % (ref 43.0–77.0)
Platelets: 120 10*3/uL — ABNORMAL LOW (ref 150.0–400.0)
RBC: 4.84 Mil/uL (ref 3.87–5.11)
RDW: 19.2 % — ABNORMAL HIGH (ref 11.5–15.5)
WBC: 5.5 10*3/uL (ref 4.0–10.5)

## 2019-08-23 LAB — SEDIMENTATION RATE: Sed Rate: 27 mm/hr (ref 0–30)

## 2019-08-23 LAB — URIC ACID: Uric Acid, Serum: 4 mg/dL (ref 2.4–7.0)

## 2019-08-23 MED ORDER — PREDNISONE 10 MG PO TABS: ORAL_TABLET | ORAL | 0 refills | Status: DC

## 2019-08-31 ENCOUNTER — Encounter: Payer: Self-pay | Admitting: Family Medicine

## 2019-08-31 ENCOUNTER — Ambulatory Visit (INDEPENDENT_AMBULATORY_CARE_PROVIDER_SITE_OTHER): Payer: Medicare Other | Admitting: Family Medicine

## 2019-08-31 ENCOUNTER — Other Ambulatory Visit: Payer: Self-pay

## 2019-08-31 DIAGNOSIS — M79642 Pain in left hand: Secondary | ICD-10-CM

## 2019-08-31 DIAGNOSIS — M79641 Pain in right hand: Secondary | ICD-10-CM

## 2019-08-31 DIAGNOSIS — W57XXXS Bitten or stung by nonvenomous insect and other nonvenomous arthropods, sequela: Secondary | ICD-10-CM | POA: Diagnosis not present

## 2019-08-31 DIAGNOSIS — M25532 Pain in left wrist: Secondary | ICD-10-CM | POA: Diagnosis not present

## 2019-08-31 DIAGNOSIS — M25531 Pain in right wrist: Secondary | ICD-10-CM

## 2019-08-31 MED ORDER — COLCHICINE 0.6 MG PO CAPS: ORAL_CAPSULE | ORAL | 3 refills | Status: DC

## 2019-08-31 MED ORDER — PREDNISONE 10 MG PO TABS: ORAL_TABLET | ORAL | 0 refills | Status: DC

## 2019-08-31 NOTE — Progress Notes (Signed)
Swelling in both hands after covid vaccine in July Was put on prednisone  The was bit by a tick as well Just finished a round of steroids yesterday She is fully functional when on steroids Has cirrhosis of the lever Recently lost 100lbs without trying

## 2019-08-31 NOTE — Addendum Note (Signed)
Addended by: Hortencia Pilar on: 08/31/2019 03:33 PM   Modules accepted: Orders

## 2019-08-31 NOTE — Addendum Note (Signed)
Addended by: Marlyne Beards on: 08/31/2019 03:48 PM   Modules accepted: Orders
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
## 2019-09-01 ENCOUNTER — Other Ambulatory Visit (INDEPENDENT_AMBULATORY_CARE_PROVIDER_SITE_OTHER): Payer: Medicare Other

## 2019-09-01 DIAGNOSIS — E039 Hypothyroidism, unspecified: Secondary | ICD-10-CM

## 2019-09-01 LAB — TSH: TSH: 0.86 u[IU]/mL (ref 0.35–4.50)

## 2019-09-02 ENCOUNTER — Encounter: Payer: Self-pay | Admitting: Family

## 2019-09-02 LAB — RHEUMATOID FACTOR: Rheumatoid fact SerPl-aCnc: 15 IU/mL — ABNORMAL HIGH (ref <14)

## 2019-09-02 LAB — CYCLIC CITRUL PEPTIDE ANTIBODY, IGG: Cyclic Citrullin Peptide Ab: <16 UNITS

## 2019-09-02 LAB — C-REACTIVE PROTEIN: CRP: 3.4 mg/L (ref <8.0)

## 2019-09-02 LAB — ANA: Anti Nuclear Antibody (ANA): NEGATIVE

## 2019-09-02 NOTE — Progress Notes (Signed)
Mailed out to pt 

## 2019-09-05 ENCOUNTER — Telehealth: Payer: Self-pay | Admitting: Family Medicine

## 2019-09-05 NOTE — Telephone Encounter (Signed)
Lvm for pt to call back. 

## 2019-09-05 NOTE — Telephone Encounter (Signed)
Labs show a borderline-positive rheumatoid factor.  Other results were normal.  If symptoms don't improve, will refer to rheumatologist.

## 2019-09-06 ENCOUNTER — Telehealth: Payer: Self-pay | Admitting: Family Medicine

## 2019-09-06 LAB — B. BURGDORFI ANTIBODIES BY WB

## 2019-09-06 NOTE — Telephone Encounter (Signed)
Lyme titers were negative/normal.

## 2019-09-07 NOTE — Telephone Encounter (Signed)
I called and lmom advising of lab results, advised to call be if she has any questions

## 2019-09-09 ENCOUNTER — Telehealth: Payer: Self-pay | Admitting: Family Medicine

## 2019-09-09 DIAGNOSIS — M79641 Pain in right hand: Secondary | ICD-10-CM

## 2019-09-09 DIAGNOSIS — M25531 Pain in right wrist: Secondary | ICD-10-CM

## 2019-09-09 DIAGNOSIS — M79642 Pain in left hand: Secondary | ICD-10-CM

## 2019-09-09 DIAGNOSIS — M0580 Other rheumatoid arthritis with rheumatoid factor of unspecified site: Secondary | ICD-10-CM

## 2019-09-09 DIAGNOSIS — M25532 Pain in left wrist: Secondary | ICD-10-CM

## 2019-09-09 NOTE — Telephone Encounter (Signed)
See below, Megan [name] called with her labs and she is calling with her pain  Please advise

## 2019-09-09 NOTE — Telephone Encounter (Signed)
Patient returned call asked for a call back concerning message she received. Patient said her hands are swelling and she's having pain again at the joint. The number to contact patient is [phone]

## 2019-09-09 NOTE — Telephone Encounter (Signed)
Referral made to rheumatology 

## 2019-09-12 ENCOUNTER — Other Ambulatory Visit (INDEPENDENT_AMBULATORY_CARE_PROVIDER_SITE_OTHER): Payer: Medicare Other

## 2019-09-12 ENCOUNTER — Ambulatory Visit (INDEPENDENT_AMBULATORY_CARE_PROVIDER_SITE_OTHER): Payer: Medicare Other | Admitting: Gastroenterology

## 2019-09-12 ENCOUNTER — Encounter: Payer: Self-pay | Admitting: Gastroenterology

## 2019-09-12 VITALS — BP 116/78 | HR 64 | Ht 67.0 in | Wt 227.0 lb

## 2019-09-12 DIAGNOSIS — K754 Autoimmune hepatitis: Secondary | ICD-10-CM

## 2019-09-12 DIAGNOSIS — K7460 Unspecified cirrhosis of liver: Secondary | ICD-10-CM | POA: Diagnosis not present

## 2019-09-12 DIAGNOSIS — K921 Melena: Secondary | ICD-10-CM

## 2019-09-12 DIAGNOSIS — K5900 Constipation, unspecified: Secondary | ICD-10-CM | POA: Diagnosis not present

## 2019-09-12 LAB — COMPREHENSIVE METABOLIC PANEL
ALT: 28 U/L (ref 0–35)
AST: 27 U/L (ref 0–37)
Albumin: 3.9 g/dL (ref 3.5–5.2)
Alkaline Phosphatase: 70 U/L (ref 39–117)
BUN: 17 mg/dL (ref 6–23)
CO2: 28 mEq/L (ref 19–32)
Calcium: 9.1 mg/dL (ref 8.4–10.5)
Chloride: 103 mEq/L (ref 96–112)
Creatinine, Ser: 0.49 mg/dL (ref 0.40–1.20)
GFR: 130.42 mL/min (ref >60.00)
Glucose, Bld: 128 mg/dL — ABNORMAL HIGH (ref 70–99)
Potassium: 4.2 mEq/L (ref 3.5–5.1)
Sodium: 137 mEq/L (ref 135–145)
Total Bilirubin: 0.5 mg/dL (ref 0.2–1.2)
Total Protein: 7.1 g/dL (ref 6.0–8.3)

## 2019-09-12 LAB — PROTIME-INR
INR: 1.1 ratio — ABNORMAL HIGH (ref 0.8–1.0)
Prothrombin Time: 12.5 s (ref 9.6–13.1)

## 2019-09-12 MED ORDER — CLENPIQ 10-3.5-12 MG-GM -GM/160ML PO SOLN: 1.0000 | ORAL | 0 refills | Status: DC
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
## 2019-09-12 NOTE — Patient Instructions (Addendum)
If you are age 56 or   older, your body mass index should be between 23-30. Your Body mass index is 35.55 kg/m. If this is out of the aforementioned range listed, please consider follow up with your Primary Care Provider.  If you are age 48 or younger, your body mass index should be between 19-25. Your Body mass index is 35.55 kg/m. If this is out of the aformentioned range listed, please consider follow up with your Primary Care Provider.   You have been scheduled for an endoscopy. Please follow written instructions given to you at your visit today. If you use inhalers (even only as needed), please bring them with you on the day of your procedure.  We have sent the following medications to your pharmacy for you to pick up at your convenience: [facility] provider has requested that you go to the basement level for lab work at [place]. [place], [place] . Press "B" on the elevator. The lab is located at the first door on the left as you exit the elevator.     It was a pleasure to see you today!  Vito Cirigliano, D.O.

## 2019-09-13 ENCOUNTER — Ambulatory Visit: Payer: Medicare Other | Admitting: Family

## 2019-09-16 ENCOUNTER — Ambulatory Visit (INDEPENDENT_AMBULATORY_CARE_PROVIDER_SITE_OTHER): Payer: Medicare Other | Admitting: Medical

## 2019-09-16 ENCOUNTER — Other Ambulatory Visit: Payer: Self-pay

## 2019-09-16 VITALS — BP 126/57 | HR 60 | Temp 98.0°F | Resp 18 | Ht 67.0 in | Wt 222.0 lb

## 2019-09-16 DIAGNOSIS — M2550 Pain in unspecified joint: Secondary | ICD-10-CM

## 2019-09-16 DIAGNOSIS — M0580 Other rheumatoid arthritis with rheumatoid factor of unspecified site: Secondary | ICD-10-CM

## 2019-09-16 MED ORDER — PREDNISONE 10 MG (21) PO TBPK: ORAL_TABLET | ORAL | 0 refills | Status: DC

## 2019-09-16 NOTE — Patient Instructions (Addendum)
You do have diffuse polyarthralgias over the last month.  Slight increased rheumatoid factor.  3 times on prednisone resolved symptoms completely but then rapid recurrence within 24 hours after prednisone stopped.  Colchicine had no impact.  Patient cannot take NSAIDs.  We will prescribe 6-day taper dose of prednisone.  We will get rheumatoid factor, sed rate and CBC today.  Want update on how you are doing on day 5 of your treatment.  Will plan to discuss medication treatment with your PCP.  Will see if  she agrees to continue low-dose 10 mg or 5 mg until you get in with rheumatologist.

## 2019-09-16 NOTE — Progress Notes (Signed)
   Subjective:    Patient ID: Megan [name], female    DOB: 1965/02/07, 54 y.o.   MRN: [id number]  HPI Pt seen by Dr. Larose Kells and he referred to Dr. Derry Lory.  Dr. Derry Lory did blood work as well.  Those labs are in epic.  Pt had normal uric acid and norma wbc. Had this done by Dr. Larose Kells.  Pt had tick bite studies which were negative.  Pt states rheuamtoid factor was elevated. Pt has 2 relative with RA.  Pt states she got colchine and it did not help at all.  Pt states pain and swelling went away with prednisone. Went away very quickly. After stop prednisone pain will return.   Pt states Melissa, Dr Larose Kells and Dr. Derry Lory gave prednisone.  Last round of prednisone gvien last week. Pt taking care of 14 month grandchild. Pt daughter passed away. So she needs to be able to help.  Pt is not diabetic.  Pt has appointment oct 1 with rheumatologist.  Review of Systems  Constitutional: Negative for chills, fatigue and fever.  Respiratory: Negative for chest tightness and shortness of breath.   Cardiovascular: Negative for chest pain and palpitations.  Gastrointestinal: Negative for abdominal pain.  Musculoskeletal: Positive for arthralgias. Negative for myalgias.  Skin: Negative for rash.  Neurological: Negative for dizziness, numbness and headaches.  Hematological: Negative for adenopathy. Does not bruise/bleed easily.  Psychiatric/Behavioral: Negative for behavioral problems and confusion.       Objective:   Physical Exam    General- No acute distress. Pleasant patient. Neurologic- CNII- XII grossly intact. Joints-bilaterally both hands appear to have mild swelling at MCP PIP and DIP joints. Left wrist looks mildly swollen with faint tenderness to palpation.  Faint warmth presently. Right knee-mild tenderness to palpation.  More so medial aspect. Right elbow-looks mildly swollen.  Reports pain on flexion extension.         Assessment & Plan:  You do have diffuse polyarthralgias over the  last month.  Slight increased rheumatoid factor.  3 times on prednisone resolved symptoms completely but then rapid recurrence within 24 hours after prednisone stopped.  Colchicine had no impact.  Patient cannot take NSAIDs.  We will prescribe 6-day taper dose of prednisone.  We will get rheumatoid factor, sed rate and CBC today.  Want update on how you are doing on day 5 of your treatment.  Will plan to discuss medication treatment with your PCP.  Will see if  she agrees to continue low-dose 10 mg or 5 mg until you get in with rheumatologist.  Mackie Pai, PA-C

## 2019-09-17 LAB — CBC WITH DIFFERENTIAL/PLATELET
Absolute Monocytes: 488 cells/uL (ref 200–950)
Basophils Absolute: 33 cells/uL (ref 0–200)
Basophils Relative: 0.5 %
Eosinophils Absolute: 228 cells/uL (ref 15–500)
Eosinophils Relative: 3.5 %
HCT: 39.5 % (ref 35.0–45.0)
Hemoglobin: 13 g/dL (ref 11.7–15.5)
Lymphs Abs: 1060 cells/uL (ref 850–3900)
MCH: 26.8 pg — ABNORMAL LOW (ref 27.0–33.0)
MCHC: 32.9 g/dL (ref 32.0–36.0)
MCV: 81.4 fL (ref 80.0–100.0)
MPV: 12.5 fL (ref 7.5–12.5)
Monocytes Relative: 7.5 %
Neutro Abs: 4693 cells/uL (ref 1500–7800)
Neutrophils Relative %: 72.2 %
Platelets: 122 10*3/uL — ABNORMAL LOW (ref 140–400)
RBC: 4.85 10*6/uL (ref 3.80–5.10)
RDW: 16.8 % — ABNORMAL HIGH (ref 11.0–15.0)
Total Lymphocyte: 16.3 %
WBC: 6.5 10*3/uL (ref 3.8–10.8)

## 2019-09-17 LAB — RHEUMATOID FACTOR: Rheumatoid fact SerPl-aCnc: <14 IU/mL (ref <14)

## 2019-09-17 LAB — SEDIMENTATION RATE: Sed Rate: 2 mm/h (ref 0–30)

## 2019-09-21 ENCOUNTER — Other Ambulatory Visit: Payer: Self-pay | Admitting: Family

## 2019-09-22 ENCOUNTER — Other Ambulatory Visit: Payer: Self-pay

## 2019-09-22 ENCOUNTER — Ambulatory Visit (INDEPENDENT_AMBULATORY_CARE_PROVIDER_SITE_OTHER): Payer: Medicare Other | Admitting: Medical

## 2019-09-22 VITALS — BP 111/76 | HR 52 | Temp 98.4°F | Resp 18 | Ht 67.0 in | Wt 226.0 lb

## 2019-09-22 DIAGNOSIS — M2550 Pain in unspecified joint: Secondary | ICD-10-CM | POA: Diagnosis not present

## 2019-09-22 MED ORDER — PREDNISONE 10 MG PO TABS: ORAL_TABLET | ORAL | 0 refills | Status: DC

## 2019-09-22 NOTE — Patient Instructions (Addendum)
You have persisting and recurrent joint pain with  elevated rheumatoid factor and various clinical response to taper prednisone.   Your pain is coming back and want to avoid pain and inflammation when you come off prednisone completley.  I sent message to your pcp to see if she agrees with prednisone 10 mg daily until you see rheumatologist. Awaiting for response. Will rx 5 days of prednisone 10 mg daily. When Melissa gets back with me can send remaining tabs pending rheumatologist appointment on oct 1st.  Notify us/update [place] if you have severe flair despite treatment.
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
## 2019-09-23 ENCOUNTER — Other Ambulatory Visit: Payer: Self-pay | Admitting: Family

## 2019-09-23 MED ORDER — LEVOTHYROXINE SODIUM 125 MCG PO TABS: 125.0000 ug | ORAL_TABLET | Freq: Every day | ORAL | 1 refills | Status: DC

## 2019-09-26 ENCOUNTER — Telehealth: Payer: Self-pay | Admitting: Medical

## 2019-09-26 MED ORDER — PREDNISONE 5 MG PO TABS: ORAL_TABLET | ORAL | 0 refills | Status: DC

## 2019-09-26 NOTE — Telephone Encounter (Signed)
Rx prednisone sent to pt pharmacy. 

## 2019-09-27 ENCOUNTER — Other Ambulatory Visit: Payer: Self-pay

## 2019-09-27 ENCOUNTER — Encounter: Payer: Medicare Other | Attending: Surgery | Admitting: Skilled Nursing Facility1

## 2019-09-27 DIAGNOSIS — E669 Obesity, unspecified: Secondary | ICD-10-CM

## 2019-09-27 DIAGNOSIS — Z713 Dietary counseling and surveillance: Secondary | ICD-10-CM | POA: Diagnosis not present

## 2019-09-28 ENCOUNTER — Encounter: Payer: Self-pay | Admitting: Certified Registered Nurse Anesthetist

## 2019-09-28 DIAGNOSIS — K7460 Unspecified cirrhosis of liver: Secondary | ICD-10-CM | POA: Diagnosis not present

## 2019-09-29 ENCOUNTER — Other Ambulatory Visit: Payer: Self-pay

## 2019-09-29 ENCOUNTER — Ambulatory Visit (AMBULATORY_SURGERY_CENTER): Payer: Medicare Other | Admitting: Gastroenterology

## 2019-09-29 ENCOUNTER — Encounter: Payer: Self-pay | Admitting: Gastroenterology

## 2019-09-29 VITALS — BP 102/52 | HR 51 | Temp 97.5°F | Resp 11 | Ht 67.0 in | Wt 227.0 lb

## 2019-09-29 DIAGNOSIS — K7460 Unspecified cirrhosis of liver: Secondary | ICD-10-CM | POA: Diagnosis not present

## 2019-09-29 DIAGNOSIS — K317 Polyp of stomach and duodenum: Secondary | ICD-10-CM

## 2019-09-29 DIAGNOSIS — Z538 Procedure and treatment not carried out for other reasons: Secondary | ICD-10-CM

## 2019-09-29 DIAGNOSIS — K5900 Constipation, unspecified: Secondary | ICD-10-CM | POA: Diagnosis not present

## 2019-09-29 DIAGNOSIS — K754 Autoimmune hepatitis: Secondary | ICD-10-CM

## 2019-09-29 DIAGNOSIS — K2970 Gastritis, unspecified, without bleeding: Secondary | ICD-10-CM

## 2019-09-29 DIAGNOSIS — K641 Second degree hemorrhoids: Secondary | ICD-10-CM | POA: Diagnosis not present

## 2019-09-29 DIAGNOSIS — K921 Melena: Secondary | ICD-10-CM | POA: Diagnosis not present

## 2019-09-29 DIAGNOSIS — K3189 Other diseases of stomach and duodenum: Secondary | ICD-10-CM | POA: Diagnosis not present

## 2019-09-29 MED ORDER — SODIUM CHLORIDE 0.9 % IV SOLN: 500.0000 mL | Freq: Once | INTRAVENOUS | Status: DC

## 2019-09-29 NOTE — Patient Instructions (Signed)
YOU HAD AN ENDOSCOPIC PROCEDURE TODAY AT THE [facility]:   Refer to the procedure report that was given to you for any specific questions about what was found during the examination.  If the procedure report does not answer your questions, please call your gastroenterologist to clarify.  If you requested that your care partner not be given the details of your procedure findings, then the procedure report has been included in a sealed envelope for you to review at your convenience later.  YOU SHOULD EXPECT: Some feelings of bloating in the abdomen. Passage of more gas than usual.  Walking can help get rid of the air that was put into your GI tract during the procedure and reduce the bloating. If you had a lower endoscopy (such as a colonoscopy or flexible sigmoidoscopy) you may notice spotting of blood in your stool or on the toilet paper. If you underwent a bowel prep for your procedure, you may not have a normal bowel movement for a few days.  Please Note:  You might notice some irritation and congestion in your nose or some drainage.  This is from the oxygen used during your procedure.  There is no need for concern and it should clear up in a day or so.  SYMPTOMS TO REPORT IMMEDIATELY:   Following lower endoscopy (colonoscopy or flexible sigmoidoscopy):  Excessive amounts of blood in the stool  Significant tenderness or worsening of abdominal pains  Swelling of the abdomen that is new, acute  Fever of 100F or higher   Following upper endoscopy (EGD)  Vomiting of blood or coffee ground material  New chest pain or pain under the shoulder blades  Painful or persistently difficult swallowing  New shortness of breath  Fever of 100F or higher  Black, tarry-looking stools  For urgent or emergent issues, a gastroenterologist can be reached at any hour by calling [phone]. Do not use MyChart messaging for urgent concerns.    DIET:  We do recommend a small meal at first, but  then you may proceed to your regular diet.  Drink plenty of fluids but you should avoid alcoholic beverages for 24 hours.  ACTIVITY:  You should plan to take it easy for the rest of today and you should NOT DRIVE or use heavy machinery until tomorrow (because of the sedation medicines used during the test).    FOLLOW UP: Our staff will call the number listed on your records 48-72 hours following your procedure to check on you and address any questions or concerns that you may have regarding the information given to you following your procedure. If we do not reach you, we will leave a message.  We will attempt to reach you two times.  During this call, we will ask if you have developed any symptoms of COVID 19. If you develop any symptoms (ie: fever, flu-like symptoms, shortness of breath, cough etc.) before then, please call [phone].  If you test positive for Covid 19 in the 2 weeks post procedure, please call and report this information to us.    If any biopsies were taken you will be contacted by phone or by letter within the next 1-3 weeks.  Please call us at [phone] if you have not heard about the biopsies in 3 weeks.    SIGNATURES/CONFIDENTIALITY: You and/or your care partner have signed paperwork which will be entered into your electronic medical record.  These signatures attest to the fact that that the information above on   your After Visit Summary has been reviewed and is understood.  Full responsibility of the confidentiality of this discharge information lies with you and/or your care-partner. 

## 2019-09-29 NOTE — Op Note (Signed)
[facility] Patient Name: Megan [name] Procedure Date: 09/29/2019 11:24 AM MRN: [id number] Endoscopist: Gerrit Heck , MD Age: 54 Referring MD:  Date of Birth: 08-02-1965 Gender: Female Account #: [id number] Procedure:                Colonoscopy Indications:              Hematochezia, Constipation Medicines:                Monitored Anesthesia Care Procedure:                Pre-Anesthesia Assessment:                           - Prior to the procedure, a History and Physical                            was performed, and patient medications and                            allergies were reviewed. The patient's tolerance of                            previous anesthesia was also reviewed. The risks                            and benefits of the procedure and the sedation                            options and risks were discussed with the patient.                            All questions were answered, and informed consent                            was obtained. Prior Anticoagulants: The patient has                            taken no previous anticoagulant or antiplatelet                            agents. ASA Grade Assessment: III - A patient with                            severe systemic disease. After reviewing the risks                            and benefits, the patient was deemed in                            satisfactory condition to undergo the procedure.                           After obtaining informed consent, the colonoscope  was passed under direct vision. Throughout the                            procedure, the patient's blood pressure, pulse, and                            oxygen saturations were monitored continuously. The                            Colonoscope was introduced through the anus with                            the intention of advancing to the cecum. The scope                            was advanced to the distal  sigmoid colon before the                            procedure was aborted. Medications were given. The                            colonoscopy was technically difficult and complex                            due to poor bowel prep. The patient tolerated the                            procedure well. The quality of the bowel                            preparation was poor. The rectum was photographed. Scope In: 11:41:46 AM Scope Out: 11:45:56 AM Total Procedure Duration: 0 hours 4 minutes 10 seconds  Findings:                 Hemorrhoids were found on perianal exam.                           A large amount of solid stool was found in the                            rectum, in the recto-sigmoid colon and in the                            distal sigmoid colon, precluding visualization.                            Unable to advance the colonoscope any further and                            the procedure was then aborted.                           Non-bleeding internal hemorrhoids were found during  anoscopy. The hemorrhoids were medium-sized and                            Grade II (internal hemorrhoids that prolapse but                            reduce spontaneously). Complications:            No immediate complications. Estimated Blood Loss:     Estimated blood loss: none. Impression:               - Preparation of the colon was poor.                           - Hemorrhoids found on perianal exam.                           - Stool in the rectum, in the recto-sigmoid colon                            and in the distal sigmoid colon.                           - Non-bleeding internal hemorrhoids.                           - No specimens collected. Recommendation:           - Patient has a contact number available for                            emergencies. The signs and symptoms of potential                            delayed complications were discussed with the                             patient. Return to normal activities tomorrow.                            Written discharge instructions were provided to the                            patient.                           - Resume previous diet.                           - Continue present medications.                           - Repeat colonoscopy at the next available                            appointment because the bowel preparation was poor.  Plan for extended 2 day bowel preparation with                            clear liquids x2 days, Dulcolax 10 mg PO BID x2                            days prior, Fleets enema x1 prior to starting bowel                            prep, then bowel preparation per protocol. Gerrit Heck, MD 09/29/2019 12:02:04 PM

## 2019-09-29 NOTE — Progress Notes (Signed)
1130 Robinul 0.1 mg IV given due large amount of secretions upon assessment.  MD made aware, vss 

## 2019-09-29 NOTE — Progress Notes (Signed)
Pt's states no medical or surgical changes since previsit or office visit. 

## 2019-09-29 NOTE — Progress Notes (Signed)
Artifical right eye.

## 2019-09-29 NOTE — Progress Notes (Signed)
Called to room to assist during endoscopic procedure.  Patient ID and intended procedure confirmed with present staff. Received instructions for my participation in the procedure from the performing physician.  

## 2019-09-29 NOTE — Progress Notes (Signed)
Follow-up visit:  Post-Operative sleeve Surgery  Medical Nutrition Therapy:  Appt start time: 6:00pm end time:  7:00pm  Primary concerns today: Post-operative Bariatric Surgery Nutrition Management 6 Month Post-Op Class  Surgery type: sleeve Start weight at [facility]: 313 Weight today: 222.1   Body Composition Scale   Weight  lbs 222.1  Total Body Fat  % 41.5     Visceral Fat 13  Fat-Free Mass  % 58.4     Total Body Water  % 43.7     Muscle-Mass  lbs 32.4  BMI 34.5  Body Fat Displacement ---        Torso  lbs 57.1        Left Leg  lbs 11.4        Right Leg  lbs 11.4        Left Arm  lbs 5.7        Right Arm  lbs 5.7     Information Reviewed/ Discussed During Appointment: -Review of composition scale numbers -Fluid requirements (64-100 ounces) -Protein requirements (60-80g) -Strategies for tolerating diet -Advancement of diet to include Starchy vegetables -Barriers to inclusion of new foods -Inclusion of appropriate multivitamin and calcium supplements  -Exercise recommendations   Fluid intake: adequate   Medications: See List Supplementation: appropriate   Using straws: no Drinking while eating: no Having you been chewing well: yes Chewing/swallowing difficulties: no Changes in vision: no Changes to mood/headaches: no Hair loss/Cahnges to skin/Changes to nails: no Any difficulty focusing or concentrating: no Sweating: no Dizziness/Lightheaded: no Palpitations: no  Carbonated beverages: no N/V/D/C/GAS: no Abdominal Pain: no Dumping syndrome: no  Recent physical activity:  ADL's  Progress Towards Goal(s):  In Progress  Handouts given during visit include:  Phase V diet Progression   Goals Sheet  The Benefits of Exercise are endless.....  Support Group Topics  Pt Chosen Goals: Nutrition Goals: I will eat 3 meals a day 5 days a week by (specific date) ______december_______  Teaching Method Utilized:  Visual Auditory Hands on  Demonstrated  degree of understanding via:  Teach Back   Monitoring/Evaluation:  Dietary intake, exercise, and body weight. Follow up in 3 months for 9 month post-op visit.

## 2019-09-29 NOTE — Op Note (Signed)
[place] Patient Name: Megan [name] Procedure Date: 09/29/2019 11:24 AM MRN: [id number] Endoscopist: Gerrit Heck , MD Age: 54 Referring MD:  Date of Birth: 08/10/1965 Gender: Female Account #: [id number] Procedure:                Upper GI endoscopy Indications:              Cirrhosis rule out esophageal varices                           54 yo female with histor of AIH with cirrhotic                            appearing liver during recent gastric sleeve,                            presents for variceal screening. She follows in the                            [facility] for her cirrhosis. Medicines:                Monitored Anesthesia Care Procedure:                Pre-Anesthesia Assessment:                           - Prior to the procedure, a History and Physical                            was performed, and patient medications and                            allergies were reviewed. The patient's tolerance of                            previous anesthesia was also reviewed. The risks                            and benefits of the procedure and the sedation                            options and risks were discussed with the patient.                            All questions were answered, and informed consent                            was obtained. Prior Anticoagulants: The patient has                            taken no previous anticoagulant or antiplatelet                            agents. ASA Grade Assessment: III - A patient with  severe systemic disease. After reviewing the risks                            and benefits, the patient was deemed in                            satisfactory condition to undergo the procedure.                           After obtaining informed consent, the endoscope was                            passed under direct vision. Throughout the                            procedure, the patient's blood  pressure, pulse, and                            oxygen saturations were monitored continuously. The                            Endoscope was introduced through the mouth, and                            advanced to the second part of duodenum. The upper                            GI endoscopy was accomplished without difficulty.                            The patient tolerated the procedure well. Scope In: Scope Out: Findings:                 The examined esophagus was normal.                           The Z-line was regular and was found 37 cm from the                            incisors.                           Evidence of a sleeve gastrectomy was found in the                            gastric fundus and in the gastric body. This was                            characterized by healthy appearing mucosa.                           A single 4 mm sessile polyp with no bleeding was  found in the gastric body. The polyp was removed                            with a cold biopsy forceps. Resection and retrieval                            were complete. Estimated blood loss was minimal.                           The gastroesophageal flap valve was visualized                            endoscopically and classified as Hill Grade III                            (minimal fold, loose to endoscope, hiatal hernia                            likely).                           Localized inflammation characterized by erythema                            and polypoid mucosa was found in the gastric                            antrum, in the prepyloric region of the stomach and                            at the pylorus. The pylorus was widely patent.                            Biopsies were taken with a cold forceps for                            histology. Estimated blood loss was minimal.                           The duodenal bulb, first portion of the duodenum                             and second portion of the duodenum were normal. Complications:            No immediate complications. Estimated Blood Loss:     Estimated blood loss was minimal. Impression:               - Normal esophagus.                           - Z-line regular, 37 cm from the incisors.                           - A sleeve gastrectomy was found, characterized by  healthy appearing mucosa.                           - A single gastric polyp. Resected and retrieved.                           - Gastroesophageal flap valve classified as Hill                            Grade III (minimal fold, loose to endoscope, hiatal                            hernia likely).                           - Gastritis. Biopsied.                           - Normal duodenal bulb, first portion of the                            duodenum and second portion of the duodenum. Recommendation:           - Patient has a contact number available for                            emergencies. The signs and symptoms of potential                            delayed complications were discussed with the                            patient. Return to normal activities tomorrow.                            Written discharge instructions were provided to the                            patient.                           - Resume previous diet.                           - Continue present medications.                           - Await pathology results.                           - Perform a colonoscopy today. Gerrit Heck, MD 09/29/2019 11:56:04 AM

## 2019-09-29 NOTE — Progress Notes (Signed)
Report given to PACU, vss 

## 2019-10-03 ENCOUNTER — Telehealth: Payer: Self-pay | Admitting: *Deleted

## 2019-10-03 NOTE — Telephone Encounter (Signed)
  Follow up Call-  Call back number 09/29/2019  Post procedure Call Back phone  # [phone]  Permission to leave phone message Yes  Some recent data might be hidden     Patient questions:  Do you have a fever, pain , or abdominal swelling? No. Pain Score  0 *  Have you tolerated food without any problems? Yes.    Have you been able to return to your normal activities? Yes.    Do you have any questions about your discharge instructions: Diet   No. Medications  No. Follow up visit  No.  Do you have questions or concerns about your Care? No.  Actions: * If pain score is 4 or above: No action needed, pain <4.  1. Have you developed a fever since your procedure? NO  2.   Have you had an respiratory symptoms (SOB or cough) since your procedure? NO  3.   Have you tested positive for COVID 19 since your procedure NO  4.   Have you had any family members/close contacts diagnosed with the COVID 19 since your procedure?  NO   If yes to any of these questions please route to Joylene John, RN and Joella Prince, RN

## 2019-10-06 DIAGNOSIS — M47816 Spondylosis without myelopathy or radiculopathy, lumbar region: Secondary | ICD-10-CM | POA: Diagnosis not present

## 2019-10-06 DIAGNOSIS — G894 Chronic pain syndrome: Secondary | ICD-10-CM | POA: Diagnosis not present

## 2019-10-06 DIAGNOSIS — Z79891 Long term (current) use of opiate analgesic: Secondary | ICD-10-CM | POA: Diagnosis not present

## 2019-10-06 DIAGNOSIS — M961 Postlaminectomy syndrome, not elsewhere classified: Secondary | ICD-10-CM | POA: Diagnosis not present

## 2019-10-06 DIAGNOSIS — M47812 Spondylosis without myelopathy or radiculopathy, cervical region: Secondary | ICD-10-CM | POA: Diagnosis not present

## 2019-10-14 ENCOUNTER — Other Ambulatory Visit: Payer: Self-pay

## 2019-10-14 ENCOUNTER — Encounter: Payer: Self-pay | Admitting: Internal Medicine

## 2019-10-14 ENCOUNTER — Ambulatory Visit (INDEPENDENT_AMBULATORY_CARE_PROVIDER_SITE_OTHER): Payer: Medicare Other | Admitting: Internal Medicine

## 2019-10-14 VITALS — BP 105/69 | HR 53 | Resp 18 | Ht 66.75 in | Wt 217.0 lb

## 2019-10-14 DIAGNOSIS — M25531 Pain in right wrist: Secondary | ICD-10-CM

## 2019-10-14 DIAGNOSIS — K754 Autoimmune hepatitis: Secondary | ICD-10-CM

## 2019-10-14 DIAGNOSIS — M797 Fibromyalgia: Secondary | ICD-10-CM

## 2019-10-14 DIAGNOSIS — I10 Essential (primary) hypertension: Secondary | ICD-10-CM

## 2019-10-14 DIAGNOSIS — M25532 Pain in left wrist: Secondary | ICD-10-CM

## 2019-10-14 DIAGNOSIS — M1990 Unspecified osteoarthritis, unspecified site: Secondary | ICD-10-CM | POA: Diagnosis not present

## 2019-10-14 HISTORY — DX: Pain in right wrist: M25.531

## 2019-10-14 MED ORDER — PREDNISONE 5 MG PO TABS: ORAL_TABLET | ORAL | 0 refills | Status: DC
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
## 2019-10-14 NOTE — Assessment & Plan Note (Addendum)
Megan [name] experiencing pain involving bilateral wrists and right shoulder right elbow and right knee with with prolonged morning stiffness up to 3 hours as well as persistent symptoms throughout the day.  She does not have any prior history of inflammatory arthritis, although has a well-documented history with autoimmune hepatitis and autoimmune thyroid disease.  These are associated with increased likelihood of rheumatoid arthritis.  Weakly positive RF of 15 and negative CCP so I would consider this seronegative.  Normal inflammatory markers are unreliable in setting of current corticosteroid use and I would not repeat these today for the same reason.  Physical exam does show some swelling and tenderness in the wrists but nothing in other joints, again also still currently on prednisone.  For patients with established inflammatory arthritis incidence of up to about 7% have been reported with flares secondary to COVID-19 vaccination series this could represent a similar process although she did not have a clinically apparent pre-existing inflammatory arthritis.  She is not a candidate for methotrexate or leflunomide due to existing liver disease.  We discussed that if this is reactive arthritis majority of patients resolve without requiring long-term therapy, or some may be 20% will develop persistent symptoms.  She prefers to not initiate additional immunomodulatory drug would rather wait and see and remain on low-dose prednisone.  Recent x-rays reviewed did not show any erosive change so I think this is a reasonable plan due to the complexity from her underlying liver disease.  We spent a significant portion of the visit reviewing these possible options and her prior diagnostic work-up.  Continue prednisone 5 mg p.o. daily for now, plan to taper if doing well or initiate DMARD if not doing well at follow-up Return to clinic in 8 weeks for reassessment

## 2019-10-18 DIAGNOSIS — H0102B Squamous blepharitis left eye, upper and lower eyelids: Secondary | ICD-10-CM | POA: Diagnosis not present

## 2019-10-18 DIAGNOSIS — H0102A Squamous blepharitis right eye, upper and lower eyelids: Secondary | ICD-10-CM | POA: Diagnosis not present

## 2019-10-18 DIAGNOSIS — H00021 Hordeolum internum right upper eyelid: Secondary | ICD-10-CM | POA: Diagnosis not present

## 2019-10-19 ENCOUNTER — Ambulatory Visit (AMBULATORY_SURGERY_CENTER): Payer: Self-pay | Admitting: *Deleted

## 2019-10-19 ENCOUNTER — Other Ambulatory Visit: Payer: Self-pay

## 2019-10-19 VITALS — Ht 67.0 in | Wt 214.0 lb

## 2019-10-19 DIAGNOSIS — K921 Melena: Secondary | ICD-10-CM

## 2019-10-19 MED ORDER — NA SULFATE-K SULFATE-MG SULF 17.5-3.13-1.6 GM/177ML PO SOLN: ORAL | 0 refills | Status: DC

## 2019-10-19 NOTE — Progress Notes (Signed)
Patient is here in-person for PV. Patient denies any allergies to eggs or soy. Patient denies any problems with anesthesia/sedation. Patient denies any oxygen use at home. Patient denies taking any diet/weight loss medications or blood thinners. Patient is not being treated for MRSA or C-diff. Patient is aware of our care-partner policy and [id number] safety protocol.COVID-19 vaccines completed on 07/28/2019, per patient.

## 2019-10-21 DIAGNOSIS — H00021 Hordeolum internum right upper eyelid: Secondary | ICD-10-CM | POA: Diagnosis not present

## 2019-10-25 ENCOUNTER — Other Ambulatory Visit: Payer: Self-pay

## 2019-10-25 ENCOUNTER — Ambulatory Visit (AMBULATORY_SURGERY_CENTER): Payer: Medicare Other | Admitting: Gastroenterology

## 2019-10-25 ENCOUNTER — Encounter: Payer: Self-pay | Admitting: Gastroenterology

## 2019-10-25 VITALS — BP 91/53 | HR 53 | Temp 96.0°F | Resp 26 | Ht 67.0 in | Wt 214.0 lb

## 2019-10-25 DIAGNOSIS — K921 Melena: Secondary | ICD-10-CM | POA: Diagnosis not present

## 2019-10-25 DIAGNOSIS — K5900 Constipation, unspecified: Secondary | ICD-10-CM | POA: Diagnosis not present

## 2019-10-25 DIAGNOSIS — D125 Benign neoplasm of sigmoid colon: Secondary | ICD-10-CM

## 2019-10-25 DIAGNOSIS — K635 Polyp of colon: Secondary | ICD-10-CM

## 2019-10-25 DIAGNOSIS — D122 Benign neoplasm of ascending colon: Secondary | ICD-10-CM

## 2019-10-25 DIAGNOSIS — D124 Benign neoplasm of descending colon: Secondary | ICD-10-CM

## 2019-10-25 DIAGNOSIS — K641 Second degree hemorrhoids: Secondary | ICD-10-CM

## 2019-10-25 MED ORDER — SODIUM CHLORIDE 0.9 % IV SOLN: 500.0000 mL | Freq: Once | INTRAVENOUS | Status: DC

## 2019-10-25 NOTE — Patient Instructions (Signed)
Please read handouts provided. Continue present medications. Await pathology results. Return to GI clinic as needed.     YOU HAD AN ENDOSCOPIC PROCEDURE TODAY AT THE [facility]:   Refer to the procedure report that was given to you for any specific questions about what was found during the examination.  If the procedure report does not answer your questions, please call your gastroenterologist to clarify.  If you requested that your care partner not be given the details of your procedure findings, then the procedure report has been included in a sealed envelope for you to review at your convenience later.  YOU SHOULD EXPECT: Some feelings of bloating in the abdomen. Passage of more gas than usual.  Walking can help get rid of the air that was put into your GI tract during the procedure and reduce the bloating. If you had a lower endoscopy (such as a colonoscopy or flexible sigmoidoscopy) you may notice spotting of blood in your stool or on the toilet paper. If you underwent a bowel prep for your procedure, you may not have a normal bowel movement for a few days.  Please Note:  You might notice some irritation and congestion in your nose or some drainage.  This is from the oxygen used during your procedure.  There is no need for concern and it should clear up in a day or so.  SYMPTOMS TO REPORT IMMEDIATELY:   Following lower endoscopy (colonoscopy or flexible sigmoidoscopy):  Excessive amounts of blood in the stool  Significant tenderness or worsening of abdominal pains  Swelling of the abdomen that is new, acute  Fever of 100F or higher    For urgent or emergent issues, a gastroenterologist can be reached at any hour by calling [phone]. Do not use MyChart messaging for urgent concerns.    DIET:  We do recommend a small meal at first, but then you may proceed to your regular diet.  Drink plenty of fluids but you should avoid alcoholic beverages for 24  hours.  ACTIVITY:  You should plan to take it easy for the rest of today and you should NOT DRIVE or use heavy machinery until tomorrow (because of the sedation medicines used during the test).    FOLLOW UP: Our staff will call the number listed on your records 48-72 hours following your procedure to check on you and address any questions or concerns that you may have regarding the information given to you following your procedure. If we do not reach you, we will leave a message.  We will attempt to reach you two times.  During this call, we will ask if you have developed any symptoms of COVID 19. If you develop any symptoms (ie: fever, flu-like symptoms, shortness of breath, cough etc.) before then, please call [phone].  If you test positive for Covid 19 in the 2 weeks post procedure, please call and report this information to us.    If any biopsies were taken you will be contacted by phone or by letter within the next 1-3 weeks.  Please call us at [phone] if you have not heard about the biopsies in 3 weeks.    SIGNATURES/CONFIDENTIALITY: You and/or your care partner have signed paperwork which will be entered into your electronic medical record.  These signatures attest to the fact that that the information above on your After Visit Summary has been reviewed and is understood.  Full responsibility of the confidentiality of this discharge information lies with you   and/or your care-partner. 

## 2019-10-25 NOTE — Op Note (Signed)
[facility] Patient Name: Megan [name] Procedure Date: 10/25/2019 10:15 AM MRN: [id number] Endoscopist: Gerrit Heck , MD Age: 54 Referring MD:  Date of Birth: September 13, 1965 Gender: Female Account #: [id number] Procedure:                Colonoscopy Indications:              Hematochezia, Constipation Medicines:                Monitored Anesthesia Care Procedure:                Pre-Anesthesia Assessment:                           - Prior to the procedure, a History and Physical                            was performed, and patient medications and                            allergies were reviewed. The patient's tolerance of                            previous anesthesia was also reviewed. The risks                            and benefits of the procedure and the sedation                            options and risks were discussed with the patient.                            All questions were answered, and informed consent                            was obtained. Prior Anticoagulants: The patient has                            taken no previous anticoagulant or antiplatelet                            agents. ASA Grade Assessment: III - A patient with                            severe systemic disease. After reviewing the risks                            and benefits, the patient was deemed in                            satisfactory condition to undergo the procedure.                           After obtaining informed consent, the colonoscope  was passed under direct vision. Throughout the                            procedure, the patient's blood pressure, pulse, and                            oxygen saturations were monitored continuously. The                            Colonoscope was introduced through the anus and                            advanced to the the terminal ileum. The colonoscopy                            was performed without  difficulty. The patient                            tolerated the procedure well. The quality of the                            bowel preparation was good. The terminal ileum,                            ileocecal valve, appendiceal orifice, and rectum                            were photographed. Scope In: 10:29:02 AM Scope Out: 10:54:44 AM Scope Withdrawal Time: 0 hours 16 minutes 31 seconds  Total Procedure Duration: 0 hours 25 minutes 42 seconds  Findings:                 Hemorrhoids were found on perianal exam.                           Five sessile polyps were found in the sigmoid colon                            (3), descending colon (1), and ascending colon (1).                            The polyps were 3 to 5 mm in size. These polyps                            were removed with a cold snare. Resection and                            retrieval were complete. Estimated blood loss was                            minimal.                           Non-bleeding internal hemorrhoids were found during  retroflexion. The hemorrhoids were small and Grade                            II (internal hemorrhoids that prolapse but reduce                            spontaneously).                           The exam was otherwise normal throughout the                            remainder of the colon. No areas of mucosal                            erythema, edema, luminal narrowing, ulcers, or                            erosions noted.                           The terminal ileum appeared normal. Complications:            No immediate complications. Estimated Blood Loss:     Estimated blood loss was minimal. Impression:               - Hemorrhoids found on perianal exam.                           - Five 3 to 5 mm polyps in the sigmoid colon, in                            the descending colon and in the ascending colon,                            removed with a cold snare.  Resected and retrieved.                           - Non-bleeding internal hemorrhoids.                           - The examined portion of the ileum was normal. Recommendation:           - Patient has a contact number available for                            emergencies. The signs and symptoms of potential                            delayed complications were discussed with the                            patient. Return to normal activities tomorrow.                            Written discharge  instructions were provided to the                            patient.                           - Resume previous diet.                           - Continue present medications.                           - Await pathology results.                           - Repeat colonoscopy for surveillance based on                            pathology results.                           - Return to GI office PRN.                           - Internal hemorrhoids were noted on this study and                            may be amenable to hemorrhoid band ligation. If you                            are interested in further treatment of these                            hemorrhoids with band ligation, please contact my                            clinic to set up an appointment for evaluation and                            treatment. Gerrit Heck, MD 10/25/2019 11:00:26 AM

## 2019-10-25 NOTE — Progress Notes (Signed)
Called to room to assist during endoscopic procedure.  Patient ID and intended procedure confirmed with present staff. Received instructions for my participation in the procedure from the performing physician.  

## 2019-10-25 NOTE — Progress Notes (Signed)
Pt's states no medical or surgical changes since previsit or office visit. 

## 2019-10-25 NOTE — Progress Notes (Signed)
A/ox3, pleased with MAC, report to RN 

## 2019-10-27 ENCOUNTER — Telehealth: Payer: Self-pay

## 2019-10-27 NOTE — Telephone Encounter (Signed)
  Follow up Call-  Call back number 10/25/2019 09/29/2019  Post procedure Call Back phone  # [phone]  Permission to leave phone message Yes Yes  Some recent data might be hidden     Patient questions:  Do you have a fever, pain , or abdominal swelling? No. Pain Score  0 *  Have you tolerated food without any problems? Yes.    Have you been able to return to your normal activities? Yes.    Do you have any questions about your discharge instructions: Diet   No. Medications  No. Follow up visit  No.  Do you have questions or concerns about your Care? No.  Actions: * If pain score is 4 or above: No action needed, pain <4. 1. Have you developed a fever since your procedure? no  2.   Have you had an respiratory symptoms (SOB or cough) since your procedure? no  3.   Have you tested positive for COVID 19 since your procedure no  4.   Have you had any family members/close contacts diagnosed with the COVID 19 since your procedure?  no   If yes to any of these questions please route to Joylene John, RN and Joella Prince, RN

## 2019-11-02 ENCOUNTER — Other Ambulatory Visit: Payer: Self-pay | Admitting: Family

## 2019-11-04 ENCOUNTER — Encounter: Payer: Self-pay | Admitting: Gastroenterology

## 2019-11-04 ENCOUNTER — Ambulatory Visit: Payer: Medicare Other | Admitting: Internal Medicine

## 2019-11-08 DIAGNOSIS — M47812 Spondylosis without myelopathy or radiculopathy, cervical region: Secondary | ICD-10-CM | POA: Diagnosis not present

## 2019-11-08 DIAGNOSIS — M961 Postlaminectomy syndrome, not elsewhere classified: Secondary | ICD-10-CM | POA: Diagnosis not present

## 2019-11-08 DIAGNOSIS — G894 Chronic pain syndrome: Secondary | ICD-10-CM | POA: Diagnosis not present

## 2019-11-08 DIAGNOSIS — M47816 Spondylosis without myelopathy or radiculopathy, lumbar region: Secondary | ICD-10-CM | POA: Diagnosis not present

## 2019-11-14 ENCOUNTER — Telehealth (INDEPENDENT_AMBULATORY_CARE_PROVIDER_SITE_OTHER): Payer: Medicare Other | Admitting: Family Medicine

## 2019-11-14 ENCOUNTER — Encounter: Payer: Self-pay | Admitting: Family Medicine

## 2019-11-14 ENCOUNTER — Other Ambulatory Visit: Payer: Self-pay

## 2019-11-14 ENCOUNTER — Other Ambulatory Visit: Payer: Medicare Other

## 2019-11-14 DIAGNOSIS — Z20822 Contact with and (suspected) exposure to covid-19: Secondary | ICD-10-CM

## 2019-11-14 DIAGNOSIS — T7840XA Allergy, unspecified, initial encounter: Secondary | ICD-10-CM | POA: Diagnosis not present

## 2019-11-14 MED ORDER — FLUTICASONE PROPIONATE 50 MCG/ACT NA SUSP: 2.0000 | Freq: Every day | NASAL | 6 refills | Status: DC

## 2019-11-14 MED ORDER — AZELASTINE HCL 0.1 % NA SOLN: 1.0000 | Freq: Two times a day (BID) | NASAL | 12 refills | Status: DC
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
## 2019-11-15 LAB — SARS-COV-2, NAA 2 DAY TAT

## 2019-11-15 LAB — NOVEL CORONAVIRUS, NAA: SARS-CoV-2, NAA: NOT DETECTED

## 2019-11-18 ENCOUNTER — Other Ambulatory Visit: Payer: Self-pay

## 2019-11-18 ENCOUNTER — Encounter: Payer: Self-pay | Admitting: Family

## 2019-11-18 ENCOUNTER — Telehealth (INDEPENDENT_AMBULATORY_CARE_PROVIDER_SITE_OTHER): Payer: Medicare Other | Admitting: Family

## 2019-11-18 DIAGNOSIS — J029 Acute pharyngitis, unspecified: Secondary | ICD-10-CM | POA: Diagnosis not present

## 2019-11-18 MED ORDER — CEFUROXIME AXETIL 500 MG PO TABS: 500.0000 mg | ORAL_TABLET | Freq: Two times a day (BID) | ORAL | 0 refills | Status: DC

## 2019-11-18 NOTE — Patient Instructions (Signed)
Please begin ceftin. Call if symptoms worsen or if symptoms do not improve in 3-4 days.

## 2019-11-18 NOTE — Progress Notes (Signed)
Virtual Visit via Video Note  I connected with Megan [name] on 11/18/19 at 10:40 AM EDT by a video enabled telemedicine application and verified that I am speaking with the correct person using two identifiers.  Location: Patient: home Provider: work   I discussed the limitations of evaluation and management by telemedicine and the availability of in person appointments. The patient expressed understanding and agreed to proceed. Only the patient and myself were present for today's video call.   History of Present Illness:  Reports that her 56 yr old grand-daughter is living with her.  Symptoms began on 11/12/19. Reports that she is on prednisone from rheumatology.  Reports "burning sensation in her throat." Some nasal drainage which has improved with nasal spray.Reports that she does not feel like she is getting better. Had a negative covid test on 11/14/19. Reports that she feels feverish at night.  Not getting better like it should be.    Wt Readings from Last 3 Encounters:  10/25/19 214 lb (97.1 kg)  10/19/19 214 lb (97.1 kg)  10/14/19 217 lb (98.4 kg)   Reports sore throat 6-7/10.    Observations/Objective:   Gen: Awake, alert, no acute distress  Ent: voice is hoarse Resp: Breathing is even and non-labored Psych: calm/pleasant demeanor Neuro: Alert and Oriented x 3, + facial symmetry, speech is clear.   Assessment and Plan:  Pharyngitis- has pen allergy but has tolerated cephalosporins. Will plan empiric rx with ceftin. She is advised to call if symptoms worsen or if symptoms are not improved in 3-4 days. Pt verbalizes understanding.  Follow Up Instructions:    I discussed the assessment and treatment plan with the patient. The patient was provided an opportunity to ask questions and all were answered. The patient agreed with the plan and demonstrated an understanding of the instructions.   The patient was advised to call back or seek an in-person evaluation if the symptoms  worsen or if the condition fails to improve as anticipated.  Nance Pear, NP

## 2019-11-28 ENCOUNTER — Ambulatory Visit: Payer: Medicaid Other | Admitting: Family

## 2019-11-29 ENCOUNTER — Ambulatory Visit (INDEPENDENT_AMBULATORY_CARE_PROVIDER_SITE_OTHER): Payer: Medicare Other | Admitting: Gastroenterology

## 2019-11-29 ENCOUNTER — Encounter: Payer: Self-pay | Admitting: Gastroenterology

## 2019-11-29 ENCOUNTER — Other Ambulatory Visit (HOSPITAL_COMMUNITY)
Admission: RE | Admit: 2019-11-29 | Discharge: 2019-11-29 | Disposition: A | Discharge status: Home / Self Care | Payer: Medicare Other | Source: Ambulatory Visit | Attending: Family | Admitting: Family

## 2019-11-29 ENCOUNTER — Other Ambulatory Visit: Payer: Self-pay

## 2019-11-29 ENCOUNTER — Ambulatory Visit (INDEPENDENT_AMBULATORY_CARE_PROVIDER_SITE_OTHER): Payer: Medicare Other | Admitting: Family

## 2019-11-29 VITALS — BP 110/70 | HR 50 | Temp 97.8°F | Resp 16 | Ht 67.0 in | Wt 201.0 lb

## 2019-11-29 VITALS — BP 104/72 | HR 74 | Ht 67.0 in | Wt 204.2 lb

## 2019-11-29 DIAGNOSIS — K5900 Constipation, unspecified: Secondary | ICD-10-CM

## 2019-11-29 DIAGNOSIS — Z01419 Encounter for gynecological examination (general) (routine) without abnormal findings: Secondary | ICD-10-CM | POA: Insufficient documentation

## 2019-11-29 DIAGNOSIS — R300 Dysuria: Secondary | ICD-10-CM

## 2019-11-29 DIAGNOSIS — N760 Acute vaginitis: Secondary | ICD-10-CM

## 2019-11-29 DIAGNOSIS — Z23 Encounter for immunization: Secondary | ICD-10-CM | POA: Diagnosis not present

## 2019-11-29 DIAGNOSIS — I10 Essential (primary) hypertension: Secondary | ICD-10-CM | POA: Diagnosis not present

## 2019-11-29 DIAGNOSIS — K7460 Unspecified cirrhosis of liver: Secondary | ICD-10-CM

## 2019-11-29 DIAGNOSIS — K641 Second degree hemorrhoids: Secondary | ICD-10-CM

## 2019-11-29 DIAGNOSIS — K31A0 Gastric intestinal metaplasia, unspecified: Secondary | ICD-10-CM

## 2019-11-29 DIAGNOSIS — K754 Autoimmune hepatitis: Secondary | ICD-10-CM

## 2019-11-29 LAB — POC URINALSYSI DIPSTICK (AUTOMATED)
Bilirubin, UA: NEGATIVE
Blood, UA: NEGATIVE
Glucose, UA: NEGATIVE
Ketones, UA: NEGATIVE
Leukocytes, UA: NEGATIVE
Nitrite, UA: NEGATIVE
Protein, UA: NEGATIVE
Spec Grav, UA: 1.015 (ref 1.010–1.025)
Urobilinogen, UA: 0.2 E.U./dL
pH, UA: 6.5 (ref 5.0–8.0)

## 2019-11-29 NOTE — Patient Instructions (Signed)

## 2019-11-29 NOTE — Patient Instructions (Addendum)
Stop metoprolol.  We will let you know how your culture/swab and pap turns out.
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
## 2019-11-30 LAB — URINE CULTURE
MICRO NUMBER:: 11209519
Result:: NO GROWTH
SPECIMEN QUALITY:: ADEQUATE

## 2019-12-01 LAB — CERVICOVAGINAL ANCILLARY ONLY
Bacterial Vaginitis (gardnerella): NEGATIVE
Candida Glabrata: NEGATIVE
Candida Vaginitis: NEGATIVE
Comment: NEGATIVE
Comment: NEGATIVE
Comment: NEGATIVE

## 2019-12-02 ENCOUNTER — Encounter: Payer: Self-pay | Admitting: Family

## 2019-12-02 ENCOUNTER — Telehealth: Payer: Self-pay | Admitting: Family

## 2019-12-02 LAB — CYTOLOGY - PAP
Comment: NEGATIVE
Diagnosis: NEGATIVE
High risk HPV: NEGATIVE

## 2019-12-02 NOTE — Telephone Encounter (Signed)
Lvm for patient to call back about her results 

## 2019-12-02 NOTE — Telephone Encounter (Signed)
Patient advised all testing was negative. She will try the replens and call next week if not better.

## 2019-12-02 NOTE — Telephone Encounter (Signed)
Also, pap smear is normal.

## 2019-12-02 NOTE — Telephone Encounter (Signed)
Urine culture is negative for infection.  Swab negative for yeast or BV.  I think that her discomfort is related to post menopausal dryness.  I would recommend that she purchase over the counter Replens vaginal moisturizer.  Give this a few weeks.  Let me know if symptoms do not improve.

## 2019-12-02 NOTE — Telephone Encounter (Signed)
Sent to [facility] in error- Rod Holler, please see below.

## 2019-12-07 ENCOUNTER — Other Ambulatory Visit: Payer: Self-pay | Admitting: Internal Medicine

## 2019-12-07 DIAGNOSIS — G894 Chronic pain syndrome: Secondary | ICD-10-CM | POA: Diagnosis not present

## 2019-12-07 DIAGNOSIS — M47812 Spondylosis without myelopathy or radiculopathy, cervical region: Secondary | ICD-10-CM | POA: Diagnosis not present

## 2019-12-07 DIAGNOSIS — M1990 Unspecified osteoarthritis, unspecified site: Secondary | ICD-10-CM

## 2019-12-07 DIAGNOSIS — M25531 Pain in right wrist: Secondary | ICD-10-CM

## 2019-12-07 DIAGNOSIS — M961 Postlaminectomy syndrome, not elsewhere classified: Secondary | ICD-10-CM | POA: Diagnosis not present

## 2019-12-07 DIAGNOSIS — M47816 Spondylosis without myelopathy or radiculopathy, lumbar region: Secondary | ICD-10-CM | POA: Diagnosis not present

## 2019-12-11 ENCOUNTER — Other Ambulatory Visit: Payer: Self-pay | Admitting: Family
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
## 2019-12-12 ENCOUNTER — Telehealth: Payer: Self-pay

## 2019-12-12 ENCOUNTER — Ambulatory Visit: Payer: Medicare Other | Admitting: Internal Medicine

## 2019-12-12 NOTE — Telephone Encounter (Signed)
Patient left a voicemail stating she is scheduled for an appointment this afternoon at 3:00 pm, but has a "bad cold."   Patient is requesting a return call to let her know if she should reschedule or if Dr. Benjamine Mola wants to change it to a virtual appointment.

## 2019-12-12 NOTE — Telephone Encounter (Signed)
Spoke with patient, she has been rescheduled for Wednesday 12/21/2019 at 3pm.

## 2019-12-13 MED ORDER — LEVOTHYROXINE SODIUM 125 MCG PO TABS
125.0000 ug | ORAL_TABLET | Freq: Every day | ORAL | 1 refills | Status: DC
Start: 2019-12-13 — End: 2020-01-22

## 2019-12-16 ENCOUNTER — Ambulatory Visit: Payer: Medicare Other | Admitting: Family

## 2019-12-20 ENCOUNTER — Ambulatory Visit: Payer: Medicare Other | Admitting: Family
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
## 2019-12-21 ENCOUNTER — Other Ambulatory Visit: Payer: Self-pay

## 2019-12-21 ENCOUNTER — Ambulatory Visit (INDEPENDENT_AMBULATORY_CARE_PROVIDER_SITE_OTHER): Payer: Medicare Other | Admitting: Internal Medicine

## 2019-12-21 ENCOUNTER — Encounter: Payer: Self-pay | Admitting: Internal Medicine

## 2019-12-21 VITALS — BP 100/65 | HR 58 | Ht 67.0 in | Wt 199.0 lb

## 2019-12-21 DIAGNOSIS — M25531 Pain in right wrist: Secondary | ICD-10-CM

## 2019-12-21 DIAGNOSIS — M25532 Pain in left wrist: Secondary | ICD-10-CM | POA: Diagnosis not present

## 2019-12-21 LAB — SEDIMENTATION RATE: Sed Rate: 9 mm/h (ref 0–30)

## 2019-12-24 NOTE — Progress Notes (Signed)
Sedimentation rate is normal so no evidence of ongoing inflammation. No follow up recommended unless as needed for new problems.

## 2019-12-27 ENCOUNTER — Other Ambulatory Visit: Payer: Self-pay

## 2019-12-27 ENCOUNTER — Encounter: Payer: Medicare Other | Attending: Surgery | Admitting: Skilled Nursing Facility1

## 2019-12-27 DIAGNOSIS — E669 Obesity, unspecified: Secondary | ICD-10-CM | POA: Diagnosis present

## 2019-12-27 DIAGNOSIS — Z9884 Bariatric surgery status: Secondary | ICD-10-CM | POA: Insufficient documentation

## 2019-12-27 DIAGNOSIS — Z713 Dietary counseling and surveillance: Secondary | ICD-10-CM | POA: Insufficient documentation

## 2019-12-27 NOTE — Progress Notes (Signed)
Follow-up visit:  Post-Operative sleeve Surgery  Medical Nutrition Therapy  Surgery type: sleeve Start weight at [facility]: 313 Weight today: 195.6   Body Composition Scale  12/27/2019  Weight  lbs 222.1 195.6  Total Body Fat  % 41.5 38.3     Visceral Fat 13 11  Fat-Free Mass  % 58.4 61.6     Total Body Water  % 43.7 45.3     Muscle-Mass  lbs 32.4 31.5  BMI 34.5 31.1  Body Fat Displacement ---         Torso  lbs 57.1 46.4        Left Leg  lbs 11.4 9.2        Right Leg  lbs 11.4 9.2        Left Arm  lbs 5.7 4.6        Right Arm  lbs 5.7 4.6    Pt states she is excited [facility] BEL[facility]. Pt states she had to be on a steroid due to a reaction to the covid vaccine. Pt states he still having trouble with eating food. Pt states if she cuts a n apple into small pieces she has no problem. Pt states with meat her tummy hurts. Pt states her brother was told he could not have any potato with having had the sleeve and the pt does not understand that because se eats potatoes and still loses weight.  Pt state she has chronic pain in her back due to surgeries and rods. Pt states her daughter just passed away so is caring for her grandaughter here and there. Pt states she does not deprive herself of anything which keeps her from overindulging.   24hr recall: Breakfast: protein shake Snack: yogurt Lunch: greek yogurt Snack: beans or bean soup or broccoli cheese soup Dinner: beans or soup Snack: yogurt   Beverages: water, sometimes protein water  Fluid intake: adequate   Medications: See List Supplementation: appropriate   Using straws: no Drinking while eating: no Having you been chewing well: yes Chewing/swallowing difficulties: no Changes in vision: no Changes to mood/headaches: no Hair loss/Cahnges to skin/Changes to nails: no Any difficulty focusing or concentrating: no Sweating: no Dizziness/Lightheaded:  no Palpitations: no  Carbonated beverages: no N/V/D/C/GAS: no Abdominal Pain: no Dumping syndrome: no  Recent physical activity:  ADL's  Progress Towards Goal(s):  In Progress    Teaching Method Utilized:  Visual Auditory Hands on  Demonstrated degree of understanding via:  Teach Back   Monitoring/Evaluation:  Dietary intake, exercise, and body weight. Follow up in 2 months

## 2019-12-28 ENCOUNTER — Ambulatory Visit: Payer: Medicare Other | Admitting: Family

## 2020-01-03 ENCOUNTER — Other Ambulatory Visit: Payer: Self-pay | Admitting: Nurse Practitioner

## 2020-01-03 DIAGNOSIS — K754 Autoimmune hepatitis: Secondary | ICD-10-CM | POA: Diagnosis not present

## 2020-01-03 DIAGNOSIS — K760 Fatty (change of) liver, not elsewhere classified: Secondary | ICD-10-CM | POA: Diagnosis not present

## 2020-01-03 DIAGNOSIS — K7469 Other cirrhosis of liver: Secondary | ICD-10-CM | POA: Diagnosis not present

## 2020-01-03 DIAGNOSIS — K7460 Unspecified cirrhosis of liver: Secondary | ICD-10-CM

## 2020-01-04 ENCOUNTER — Ambulatory Visit: Payer: Medicare Other | Admitting: Gastroenterology

## 2020-01-05 DIAGNOSIS — M961 Postlaminectomy syndrome, not elsewhere classified: Secondary | ICD-10-CM | POA: Diagnosis not present

## 2020-01-05 DIAGNOSIS — M47816 Spondylosis without myelopathy or radiculopathy, lumbar region: Secondary | ICD-10-CM | POA: Diagnosis not present

## 2020-01-05 DIAGNOSIS — M47812 Spondylosis without myelopathy or radiculopathy, cervical region: Secondary | ICD-10-CM | POA: Diagnosis not present

## 2020-01-05 DIAGNOSIS — G894 Chronic pain syndrome: Secondary | ICD-10-CM | POA: Diagnosis not present

## 2020-01-16 ENCOUNTER — Ambulatory Visit: Payer: Medicare Other | Admitting: Family

## 2020-01-17 ENCOUNTER — Ambulatory Visit: Payer: Medicare Other | Admitting: Gastroenterology

## 2020-01-20 ENCOUNTER — Encounter: Payer: Self-pay | Admitting: Family

## 2020-01-20 ENCOUNTER — Other Ambulatory Visit: Payer: Self-pay

## 2020-01-20 ENCOUNTER — Ambulatory Visit (INDEPENDENT_AMBULATORY_CARE_PROVIDER_SITE_OTHER): Payer: Medicare Other | Admitting: Family

## 2020-01-20 VITALS — BP 117/76 | HR 60 | Temp 98.4°F | Resp 16 | Ht 67.0 in | Wt 188.0 lb

## 2020-01-20 DIAGNOSIS — E039 Hypothyroidism, unspecified: Secondary | ICD-10-CM

## 2020-01-20 DIAGNOSIS — L989 Disorder of the skin and subcutaneous tissue, unspecified: Secondary | ICD-10-CM

## 2020-01-20 NOTE — Patient Instructions (Signed)
Please complete lab work prior to leaving.   
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
## 2020-01-21 LAB — TSH: TSH: 12.01 mIU/L — ABNORMAL HIGH (ref 0.40–4.50)

## 2020-01-22 ENCOUNTER — Telehealth: Payer: Self-pay | Admitting: Family

## 2020-01-22 MED ORDER — LEVOTHYROXINE SODIUM 150 MCG PO TABS: 150.0000 ug | ORAL_TABLET | Freq: Every day | ORAL | 1 refills | Status: DC

## 2020-01-22 NOTE — Telephone Encounter (Signed)
Lab work shows synthroid needs to be increased to 150 mcg. Please increase and repeat tsh in 6 weeks, dx hypothyroid.

## 2020-01-23 NOTE — Telephone Encounter (Signed)
Patient advised of results and new dose. She will schedule lab appointment when she returns in 2 weeks

## 2020-01-24 DIAGNOSIS — H0102B Squamous blepharitis left eye, upper and lower eyelids: Secondary | ICD-10-CM | POA: Diagnosis not present

## 2020-01-24 DIAGNOSIS — H0102A Squamous blepharitis right eye, upper and lower eyelids: Secondary | ICD-10-CM | POA: Diagnosis not present

## 2020-01-24 DIAGNOSIS — H00021 Hordeolum internum right upper eyelid: Secondary | ICD-10-CM | POA: Diagnosis not present

## 2020-01-25 ENCOUNTER — Ambulatory Visit
Admission: RE | Admit: 2020-01-25 | Discharge: 2020-01-25 | Disposition: A | Discharge status: Home / Self Care | Payer: Medicare Other | Source: Ambulatory Visit | Attending: Nurse Practitioner | Admitting: Nurse Practitioner

## 2020-01-25 DIAGNOSIS — K7460 Unspecified cirrhosis of liver: Secondary | ICD-10-CM

## 2020-01-25 DIAGNOSIS — Z8719 Personal history of other diseases of the digestive system: Secondary | ICD-10-CM | POA: Diagnosis not present

## 2020-01-31 ENCOUNTER — Ambulatory Visit: Payer: Medicare Other | Admitting: Gastroenterology

## 2020-02-02 DIAGNOSIS — M961 Postlaminectomy syndrome, not elsewhere classified: Secondary | ICD-10-CM | POA: Diagnosis not present

## 2020-02-02 DIAGNOSIS — M47816 Spondylosis without myelopathy or radiculopathy, lumbar region: Secondary | ICD-10-CM | POA: Diagnosis not present

## 2020-02-02 DIAGNOSIS — M47812 Spondylosis without myelopathy or radiculopathy, cervical region: Secondary | ICD-10-CM | POA: Diagnosis not present

## 2020-02-02 DIAGNOSIS — G894 Chronic pain syndrome: Secondary | ICD-10-CM | POA: Diagnosis not present

## 2020-02-03 ENCOUNTER — Other Ambulatory Visit: Payer: Self-pay | Admitting: Family

## 2020-02-14 ENCOUNTER — Ambulatory Visit (INDEPENDENT_AMBULATORY_CARE_PROVIDER_SITE_OTHER): Payer: Medicare Other | Admitting: Family

## 2020-02-14 ENCOUNTER — Encounter: Payer: Self-pay | Admitting: Family

## 2020-02-14 ENCOUNTER — Other Ambulatory Visit: Payer: Self-pay

## 2020-02-14 VITALS — BP 102/65 | HR 51 | Temp 98.7°F | Resp 16 | Ht 67.0 in | Wt 187.0 lb

## 2020-02-14 DIAGNOSIS — H9201 Otalgia, right ear: Secondary | ICD-10-CM | POA: Diagnosis not present

## 2020-02-14 DIAGNOSIS — D229 Melanocytic nevi, unspecified: Secondary | ICD-10-CM

## 2020-02-14 DIAGNOSIS — L821 Other seborrheic keratosis: Secondary | ICD-10-CM | POA: Diagnosis not present

## 2020-02-14 NOTE — Patient Instructions (Signed)
  Skin Biopsy, Care After This sheet gives you information about how to care for yourself after your procedure. Your health care provider may also give you more specific instructions. If you have problems or questions, contact your health care provider. What can I expect after the procedure? After the procedure, it is common to have:  Soreness.  Bruising.  Itching. Follow these instructions at home: Biopsy site care Follow instructions from your health care provider about how to take care of your biopsy site. Make sure you:  Wash your hands with soap and water before and after you change your bandage (dressing). If soap and water are not available, use hand sanitizer.  Apply ointment on your biopsy site as directed by your health care provider.  Change your dressing as told by your health care provider.  Leave stitches (sutures), skin glue, or adhesive strips in place. These skin closures may need to stay in place for 2 weeks or longer. If adhesive strip edges start to loosen and curl up, you may trim the loose edges. Do not remove adhesive strips completely unless your health care provider tells you to do that.  If the biopsy area bleeds, apply gentle pressure for 10 minutes. Check your biopsy site every day for signs of infection. Check for:  Redness, swelling, or pain.  Fluid or blood.  Warmth.  Pus or a bad smell.   General instructions  Rest and then return to your normal activities as told by your health care provider.  Take over-the-counter and prescription medicines only as told by your health care provider.  Keep all follow-up visits as told by your health care provider. This is important. Contact a health care provider if:  You have redness, swelling, or pain around your biopsy site.  You have fluid or blood coming from your biopsy site.  Your biopsy site feels warm to the touch.  You have pus or a bad smell coming from your biopsy site.  You have a  fever.  Your sutures, skin glue, or adhesive strips loosen or come off sooner than expected. Get help right away if:  You have bleeding that does not stop with pressure or a dressing. Summary  After the procedure, it is common to have soreness, bruising, and itching at the site.  Follow instructions from your health care provider about how to take care of your biopsy site.  Check your biopsy site every day for signs of infection.  Contact a health care provider if you have redness, swelling, or pain around your biopsy site, or your biopsy site feels warm to the touch.  Keep all follow-up visits as told by your health care provider. This is important. This information is not intended to replace advice given to you by your health care provider. Make sure you discuss any questions you have with your health care provider. Document Revised: 06/29/2017 Document Reviewed: 06/29/2017 Elsevier Patient Education  2021 Elsevier Inc.  
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
## 2020-02-16 ENCOUNTER — Ambulatory Visit (INDEPENDENT_AMBULATORY_CARE_PROVIDER_SITE_OTHER): Payer: Medicare Other | Admitting: Gastroenterology

## 2020-02-16 ENCOUNTER — Encounter: Payer: Self-pay | Admitting: Gastroenterology

## 2020-02-16 VITALS — BP 104/66 | HR 56 | Ht 67.0 in | Wt 187.4 lb

## 2020-02-16 DIAGNOSIS — K7460 Unspecified cirrhosis of liver: Secondary | ICD-10-CM

## 2020-02-16 DIAGNOSIS — K641 Second degree hemorrhoids: Secondary | ICD-10-CM

## 2020-02-16 DIAGNOSIS — K921 Melena: Secondary | ICD-10-CM

## 2020-02-16 DIAGNOSIS — K5900 Constipation, unspecified: Secondary | ICD-10-CM

## 2020-02-16 DIAGNOSIS — K31A0 Gastric intestinal metaplasia, unspecified: Secondary | ICD-10-CM
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
## 2020-02-16 NOTE — Patient Instructions (Addendum)
If you are age 57 or older, your body mass index should be between 23-30. Your Body mass index is 29.35 kg/m. If this is out of the aforementioned range listed, please consider follow up with your Primary Care Provider.  If you are age 82 or younger, your body mass index should be between 19-25. Your Body mass index is 29.35 kg/m. If this is out of the aformentioned range listed, please consider follow up with your Primary Care Provider.   HEMORRHOID BANDING PROCEDURE    FOLLOW-UP CARE   1. The procedure you have had should have been relatively painless since the banding of the area involved does not have nerve endings and there is no pain sensation.  The rubber band cuts off the blood supply to the hemorrhoid and the band may fall off as soon as 48 hours after the banding (the band may occasionally be seen in the toilet bowl following a bowel movement). You may notice a temporary feeling of fullness in the rectum which should respond adequately to plain Tylenol or Motrin.  2. Following the banding, avoid strenuous exercise that evening and resume full activity the next day.  A sitz bath (soaking in a warm tub) or bidet is soothing, and can be useful for cleansing the area after bowel movements.     3. To avoid constipation, take two tablespoons of natural wheat bran, natural oat bran, flax, Benefiber or any over the counter fiber supplement and increase your water intake to 7-8 glasses daily.    4. Unless you have been prescribed anorectal medication, do not put anything inside your rectum for two weeks: No suppositories, enemas, fingers, etc.  5. Occasionally, you may have more bleeding than usual after the banding procedure.  This is often from the untreated hemorrhoids rather than the treated one.  Don't be concerned if there is a tablespoon or so of blood.  If there is more blood than this, lie flat with your bottom higher than your head and apply an ice pack to the area. If the bleeding  does not stop within a half an hour or if you feel faint, call our office at [phone] or go to the emergency room.  6. Problems are not common; however, if there is a substantial amount of bleeding, severe pain, chills, fever or difficulty passing urine (very rare) or other problems, you should call us at [phone] or report to the nearest emergency room.  7. Do not stay seated continuously for more than 2-3 hours for a day or two after the procedure.  Tighten your buttock muscles 10-15 times every two hours and take 10-15 deep breaths every 1-2 hours.  Do not spend more than a few minutes on the toilet if you cannot empty your bowel; instead re-visit the toilet at a later time.    March 20, 2020 for next banding

## 2020-02-28 ENCOUNTER — Encounter: Payer: Medicare Other | Attending: Surgery | Admitting: Skilled Nursing Facility1

## 2020-02-28 ENCOUNTER — Other Ambulatory Visit: Payer: Self-pay

## 2020-02-28 DIAGNOSIS — E669 Obesity, unspecified: Secondary | ICD-10-CM | POA: Insufficient documentation

## 2020-02-28 NOTE — Progress Notes (Signed)
Follow-up visit:  Post-Operative sleeve Surgery  Medical Nutrition Therapy  Surgery type: sleeve Start weight at [facility]: 313 Weight today: 185   Body Composition Scale  12/27/2019 02/28/2020  Weight  lbs 222.1 195.6 185  Total Body Fat  % 41.5 38.3 35.9     Visceral Fat 13 11 9   Fat-Free Mass  % 58.4 61.6 64     Total Body Water  % 43.7 45.3 46.5     Muscle-Mass  lbs 32.4 31.5 32  BMI 34.5 31.1 28.7  Body Fat Displacement ---          Torso  lbs 57.1 46.4 41.1        Left Leg  lbs 11.4 9.2 8.2        Right Leg  lbs 11.4 9.2 8.2        Left Arm  lbs 5.7 4.6 4.1        Right Arm  lbs 5.7 4.6 4.1   Pt states she watches her granddaughter here and there.  Pt state she felt she was losing weight too quickly so she added protein bars back in. Pt states she tried edamame and she really likes it. Pt states rice feels funny in her stomach. Pt state she gets about 100+ grams of protein per day.   Educated pt on incomplete proteins and needing to increase complete proteins due to a lack of grains as well as decreasing her overall protein intake.   24hr recall: protein water and protein shakes and protein bars Breakfast: protein shake Snack: yogurt Lunch:  bean soup or broccoli + cheese soup Snack: half peeled apple Dinner: baked potato and beans Snack: yogurt + granola with nuts  Beverages: water, sometimes protein water, orange juice  Fluid intake: unknown  Medications: See List Supplementation: calcium and multi  Using straws: no Drinking while eating: no Having you been chewing well: yes Chewing/swallowing difficulties: no Changes in vision: no Changes to mood/headaches: no Hair loss/[place] to skin/Changes to nails: no Any difficulty focusing or concentrating: no Sweating: no Dizziness/Lightheaded: no Palpitations: no  Carbonated beverages: no N/V/D/C/GAS: no Abdominal Pain: no Dumping syndrome: no  Recent physical activity:  BELT and walking  Progress Towards  Goal(s):  In Progress  Goals: Try [employer] products in the frozen food aisle Try tofu  Try edamame Try beyond meat brand Try impossible brand Try Quinoa  Max out at 80 grams of protein per day  Teaching Method Utilized:  Visual Auditory Hands on  Demonstrated degree of understanding via:  Teach Back   Monitoring/Evaluation:  Dietary intake, exercise, and body weight. Follow up in 2 months

## 2020-03-07 DIAGNOSIS — Q111 Other anophthalmos: Secondary | ICD-10-CM | POA: Diagnosis not present

## 2020-03-08 DIAGNOSIS — M47812 Spondylosis without myelopathy or radiculopathy, cervical region: Secondary | ICD-10-CM | POA: Diagnosis not present

## 2020-03-08 DIAGNOSIS — G894 Chronic pain syndrome: Secondary | ICD-10-CM | POA: Diagnosis not present

## 2020-03-08 DIAGNOSIS — M47816 Spondylosis without myelopathy or radiculopathy, lumbar region: Secondary | ICD-10-CM | POA: Diagnosis not present

## 2020-03-08 DIAGNOSIS — M961 Postlaminectomy syndrome, not elsewhere classified: Secondary | ICD-10-CM | POA: Diagnosis not present

## 2020-03-14 ENCOUNTER — Other Ambulatory Visit: Payer: Self-pay

## 2020-03-14 ENCOUNTER — Other Ambulatory Visit (INDEPENDENT_AMBULATORY_CARE_PROVIDER_SITE_OTHER): Payer: Medicare Other

## 2020-03-14 DIAGNOSIS — E039 Hypothyroidism, unspecified: Secondary | ICD-10-CM

## 2020-03-15 ENCOUNTER — Other Ambulatory Visit: Payer: Self-pay

## 2020-03-15 ENCOUNTER — Telehealth: Payer: Self-pay | Admitting: Family

## 2020-03-15 DIAGNOSIS — E039 Hypothyroidism, unspecified: Secondary | ICD-10-CM

## 2020-03-15 LAB — TSH: TSH: 5.62 u[IU]/mL — ABNORMAL HIGH (ref 0.35–4.50)

## 2020-03-15 MED ORDER — LEVOTHYROXINE SODIUM 175 MCG PO TABS: 175.0000 ug | ORAL_TABLET | Freq: Every day | ORAL | 1 refills | Status: DC

## 2020-03-15 NOTE — Telephone Encounter (Signed)
Called pt and advised of results and RX sent, scheduled a lab visit for 6 weeks for TSH. -JMA

## 2020-03-15 NOTE — Telephone Encounter (Signed)
Please advise pt that her synthroid needs to be increased slightly. I would like her to change to 175 mcg once daily. Repeat TSH in 6 weeks.

## 2020-03-20 ENCOUNTER — Ambulatory Visit (INDEPENDENT_AMBULATORY_CARE_PROVIDER_SITE_OTHER): Payer: Medicare Other | Admitting: Gastroenterology

## 2020-03-20 ENCOUNTER — Encounter: Payer: Self-pay | Admitting: Gastroenterology

## 2020-03-20 VITALS — BP 100/66 | HR 63 | Ht 67.0 in | Wt 189.0 lb

## 2020-03-20 DIAGNOSIS — K31A0 Gastric intestinal metaplasia, unspecified: Secondary | ICD-10-CM

## 2020-03-20 DIAGNOSIS — K641 Second degree hemorrhoids: Secondary | ICD-10-CM

## 2020-03-20 DIAGNOSIS — K7460 Unspecified cirrhosis of liver: Secondary | ICD-10-CM

## 2020-03-20 DIAGNOSIS — K5900 Constipation, unspecified: Secondary | ICD-10-CM

## 2020-03-20 DIAGNOSIS — K754 Autoimmune hepatitis: Secondary | ICD-10-CM

## 2020-03-20 NOTE — Patient Instructions (Addendum)
If you are age 57 or older, your body mass index should be between 23-30. Your Body mass index is 29.6 kg/m. If this is out of the aforementioned range listed, please consider follow up with your Primary Care Provider.  If you are age 66 or younger, your body mass index should be between 19-25. Your Body mass index is 29.6 kg/m. If this is out of the aformentioned range listed, please consider follow up with your Primary Care Provider.   HEMORRHOID BANDING PROCEDURE    FOLLOW-UP CARE   1. The procedure you have had should have been relatively painless since the banding of the area involved does not have nerve endings and there is no pain sensation.  The rubber band cuts off the blood supply to the hemorrhoid and the band may fall off as soon as 48 hours after the banding (the band may occasionally be seen in the toilet bowl following a bowel movement). You may notice a temporary feeling of fullness in the rectum which should respond adequately to plain Tylenol or Motrin.  2. Following the banding, avoid strenuous exercise that evening and resume full activity the next day.  A sitz bath (soaking in a warm tub) or bidet is soothing, and can be useful for cleansing the area after bowel movements.     3. To avoid constipation, take two tablespoons of natural wheat bran, natural oat bran, flax, Benefiber or any over the counter fiber supplement and increase your water intake to 7-8 glasses daily.    4. Unless you have been prescribed anorectal medication, do not put anything inside your rectum for two weeks: No suppositories, enemas, fingers, etc.  5. Occasionally, you may have more bleeding than usual after the banding procedure.  This is often from the untreated hemorrhoids rather than the treated one.  Don't be concerned if there is a tablespoon or so of blood.  If there is more blood than this, lie flat with your bottom higher than your head and apply an ice pack to the area. If the bleeding  does not stop within a half an hour or if you feel faint, call our office at [phone] or go to the emergency room.  6. Problems are not common; however, if there is a substantial amount of bleeding, severe pain, chills, fever or difficulty passing urine (very rare) or other problems, you should call us at [phone] or report to the nearest emergency room.  7. Do not stay seated continuously for more than 2-3 hours for a day or two after the procedure.  Tighten your buttock muscles 10-15 times every two hours and take 10-15 deep breaths every 1-2 hours.  Do not spend more than a few minutes on the toilet if you cannot empty your bowel; instead re-visit the toilet at a later time.  Due to recent changes in healthcare laws, you may see the results of your imaging and laboratory studies on MyChart before your provider has had a chance to review them.  We understand that in some cases there may be results that are confusing or concerning to you. Not all laboratory results come back in the same time frame and the provider may be waiting for multiple results in order to interpret others.  Please give [place] 48 hours in order for your provider to thoroughly review all the results before contacting the office for clarification of your results.   Take Miralax 1 capful mixed in 8 ounces of water at bed  time for constipation as tolerated.  Due to recent changes in healthcare laws, you may see the results of your imaging and laboratory studies on MyChart before your provider has had a chance to review them.  We understand that in some cases there may be results that are confusing or concerning to you. Not all laboratory results come back in the same time frame and the provider may be waiting for multiple results in order to interpret others.  Please give [place] 48 hours in order for your provider to thoroughly review all the results before contacting the office for clarification of your results.      Thank you  for choosing me and [facility].  Vito Cirigliano, D.O.
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
## 2020-04-12 DIAGNOSIS — G894 Chronic pain syndrome: Secondary | ICD-10-CM | POA: Diagnosis not present

## 2020-04-12 DIAGNOSIS — M961 Postlaminectomy syndrome, not elsewhere classified: Secondary | ICD-10-CM | POA: Diagnosis not present

## 2020-04-12 DIAGNOSIS — M47812 Spondylosis without myelopathy or radiculopathy, cervical region: Secondary | ICD-10-CM | POA: Diagnosis not present

## 2020-04-12 DIAGNOSIS — M47816 Spondylosis without myelopathy or radiculopathy, lumbar region: Secondary | ICD-10-CM | POA: Diagnosis not present

## 2020-04-26 ENCOUNTER — Other Ambulatory Visit: Payer: Self-pay

## 2020-04-26 ENCOUNTER — Ambulatory Visit (HOSPITAL_BASED_OUTPATIENT_CLINIC_OR_DEPARTMENT_OTHER)
Admission: RE | Admit: 2020-04-26 | Discharge: 2020-04-26 | Disposition: A | Discharge status: Home / Self Care | Payer: Medicare Other | Source: Ambulatory Visit | Attending: Physical Medicine and Rehabilitation | Admitting: Physical Medicine and Rehabilitation

## 2020-04-26 ENCOUNTER — Telehealth: Payer: Self-pay | Admitting: Family

## 2020-04-26 ENCOUNTER — Other Ambulatory Visit (HOSPITAL_BASED_OUTPATIENT_CLINIC_OR_DEPARTMENT_OTHER): Payer: Self-pay | Admitting: Physical Medicine and Rehabilitation

## 2020-04-26 ENCOUNTER — Other Ambulatory Visit (INDEPENDENT_AMBULATORY_CARE_PROVIDER_SITE_OTHER): Payer: Medicare Other

## 2020-04-26 DIAGNOSIS — M542 Cervicalgia: Secondary | ICD-10-CM | POA: Insufficient documentation

## 2020-04-26 DIAGNOSIS — E039 Hypothyroidism, unspecified: Secondary | ICD-10-CM

## 2020-04-26 LAB — TSH: TSH: 1.94 u[IU]/mL (ref 0.35–4.50)

## 2020-04-26 NOTE — Telephone Encounter (Signed)
Pt came in office stating that her Liver specialist (Dr Beverley Fiedler) is wanting her to have a liver panel done in labs, if possible to put in an order for pt to have orders done in our office. Please advise.

## 2020-04-30 NOTE — Telephone Encounter (Signed)
Called patient for lab appt., she reports she has been getting random palpitations, she is scheduled to see cardiology 06-14-2020. She wanted to see pcp for check up. Appointment was set up for 05-04-20. She will get labs the same day

## 2020-05-01 ENCOUNTER — Ambulatory Visit: Payer: Medicare Other | Admitting: Skilled Nursing Facility1

## 2020-05-04 ENCOUNTER — Other Ambulatory Visit: Payer: Self-pay

## 2020-05-04 ENCOUNTER — Ambulatory Visit (INDEPENDENT_AMBULATORY_CARE_PROVIDER_SITE_OTHER): Payer: Medicare Other | Admitting: Family

## 2020-05-04 DIAGNOSIS — R002 Palpitations: Secondary | ICD-10-CM | POA: Insufficient documentation

## 2020-05-04 DIAGNOSIS — K7460 Unspecified cirrhosis of liver: Secondary | ICD-10-CM | POA: Diagnosis not present

## 2020-05-04 DIAGNOSIS — Z9884 Bariatric surgery status: Secondary | ICD-10-CM | POA: Diagnosis not present

## 2020-05-04 DIAGNOSIS — R5383 Other fatigue: Secondary | ICD-10-CM | POA: Insufficient documentation

## 2020-05-04 DIAGNOSIS — D649 Anemia, unspecified: Secondary | ICD-10-CM | POA: Diagnosis not present

## 2020-05-04 HISTORY — DX: Other fatigue: R53.83

## 2020-05-04 HISTORY — DX: Palpitations: R00.2

## 2020-05-04 LAB — B12 AND FOLATE PANEL
Folate: 18.9 ng/mL
Vitamin B-12: 761 pg/mL (ref 200–1100)

## 2020-05-04 NOTE — Assessment & Plan Note (Signed)
New. TSH normal. Obtain CMET, b12, cbc.
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
## 2020-05-04 NOTE — Assessment & Plan Note (Signed)
EKG tracing is personally reviewed.  EKG notes NSR.  No acute changes.  TSH normal. Check CMET to assess electrolytes, CBC to assess for anemia.

## 2020-05-08 LAB — CBC WITH DIFFERENTIAL/PLATELET
Absolute Monocytes: 365 cells/uL (ref 200–950)
Basophils Absolute: 29 cells/uL (ref 0–200)
Basophils Relative: 0.7 %
Eosinophils Absolute: 197 cells/uL (ref 15–500)
Eosinophils Relative: 4.8 %
HCT: 38.1 % (ref 35.0–45.0)
Hemoglobin: 12.9 g/dL (ref 11.7–15.5)
Lymphs Abs: 1160 cells/uL (ref 850–3900)
MCH: 28.4 pg (ref 27.0–33.0)
MCHC: 33.9 g/dL (ref 32.0–36.0)
MCV: 83.9 fL (ref 80.0–100.0)
MPV: 11.7 fL (ref 7.5–12.5)
Monocytes Relative: 8.9 %
Neutro Abs: 2349 cells/uL (ref 1500–7800)
Neutrophils Relative %: 57.3 %
Platelets: 136 10*3/uL — ABNORMAL LOW (ref 140–400)
RBC: 4.54 10*6/uL (ref 3.80–5.10)
RDW: 13.7 % (ref 11.0–15.0)
Total Lymphocyte: 28.3 %
WBC: 4.1 10*3/uL (ref 3.8–10.8)

## 2020-05-08 LAB — METHYLMALONIC ACID, SERUM: Methylmalonic Acid, Quant: 211 nmol/L (ref 87–318)

## 2020-05-08 LAB — COMPREHENSIVE METABOLIC PANEL
AG Ratio: 1.2 (calc) (ref 1.0–2.5)
ALT: 122 U/L — ABNORMAL HIGH (ref 6–29)
AST: 106 U/L — ABNORMAL HIGH (ref 10–35)
Albumin: 3.7 g/dL (ref 3.6–5.1)
Alkaline phosphatase (APISO): 109 U/L (ref 37–153)
BUN/Creatinine Ratio: 46 (calc) — ABNORMAL HIGH (ref 6–22)
BUN: 27 mg/dL — ABNORMAL HIGH (ref 7–25)
CO2: 30 mmol/L (ref 20–32)
Calcium: 8.9 mg/dL (ref 8.6–10.4)
Chloride: 100 mmol/L (ref 98–110)
Creat: 0.59 mg/dL (ref 0.50–1.05)
Globulin: 3.2 g/dL (calc) (ref 1.9–3.7)
Glucose, Bld: 74 mg/dL (ref 65–99)
Potassium: 5 mmol/L (ref 3.5–5.3)
Sodium: 138 mmol/L (ref 135–146)
Total Bilirubin: 0.4 mg/dL (ref 0.2–1.2)
Total Protein: 6.9 g/dL (ref 6.1–8.1)

## 2020-05-09 DIAGNOSIS — M47816 Spondylosis without myelopathy or radiculopathy, lumbar region: Secondary | ICD-10-CM | POA: Diagnosis not present

## 2020-05-09 DIAGNOSIS — G894 Chronic pain syndrome: Secondary | ICD-10-CM | POA: Diagnosis not present

## 2020-05-09 DIAGNOSIS — M47812 Spondylosis without myelopathy or radiculopathy, cervical region: Secondary | ICD-10-CM | POA: Diagnosis not present

## 2020-05-09 DIAGNOSIS — M961 Postlaminectomy syndrome, not elsewhere classified: Secondary | ICD-10-CM | POA: Diagnosis not present

## 2020-05-15 DIAGNOSIS — K7469 Other cirrhosis of liver: Secondary | ICD-10-CM | POA: Diagnosis not present

## 2020-05-15 DIAGNOSIS — R5383 Other fatigue: Secondary | ICD-10-CM | POA: Diagnosis not present

## 2020-05-15 DIAGNOSIS — K760 Fatty (change of) liver, not elsewhere classified: Secondary | ICD-10-CM | POA: Insufficient documentation

## 2020-05-15 DIAGNOSIS — K7460 Unspecified cirrhosis of liver: Secondary | ICD-10-CM | POA: Insufficient documentation

## 2020-05-15 DIAGNOSIS — K754 Autoimmune hepatitis: Secondary | ICD-10-CM | POA: Diagnosis not present

## 2020-05-15 HISTORY — DX: Unspecified cirrhosis of liver: K74.60

## 2020-05-15 HISTORY — DX: Fatty (change of) liver, not elsewhere classified: K76.0

## 2020-05-16 ENCOUNTER — Other Ambulatory Visit: Payer: Self-pay | Admitting: Nurse Practitioner

## 2020-05-16 DIAGNOSIS — K754 Autoimmune hepatitis: Secondary | ICD-10-CM

## 2020-05-16 DIAGNOSIS — K760 Fatty (change of) liver, not elsewhere classified: Secondary | ICD-10-CM

## 2020-05-16 DIAGNOSIS — K7469 Other cirrhosis of liver: Secondary | ICD-10-CM

## 2020-05-22 DIAGNOSIS — K754 Autoimmune hepatitis: Secondary | ICD-10-CM | POA: Insufficient documentation

## 2020-05-22 DIAGNOSIS — M2550 Pain in unspecified joint: Secondary | ICD-10-CM | POA: Insufficient documentation

## 2020-05-22 DIAGNOSIS — M1990 Unspecified osteoarthritis, unspecified site: Secondary | ICD-10-CM | POA: Insufficient documentation

## 2020-05-22 DIAGNOSIS — I10 Essential (primary) hypertension: Secondary | ICD-10-CM | POA: Insufficient documentation

## 2020-05-22 DIAGNOSIS — H359 Unspecified retinal disorder: Secondary | ICD-10-CM | POA: Insufficient documentation

## 2020-05-22 DIAGNOSIS — G8929 Other chronic pain: Secondary | ICD-10-CM | POA: Insufficient documentation

## 2020-05-22 DIAGNOSIS — I4819 Other persistent atrial fibrillation: Secondary | ICD-10-CM | POA: Insufficient documentation

## 2020-05-22 DIAGNOSIS — E063 Autoimmune thyroiditis: Secondary | ICD-10-CM | POA: Insufficient documentation

## 2020-05-22 DIAGNOSIS — H5440 Blindness, one eye, unspecified eye: Secondary | ICD-10-CM | POA: Insufficient documentation

## 2020-05-22 DIAGNOSIS — M549 Dorsalgia, unspecified: Secondary | ICD-10-CM | POA: Insufficient documentation

## 2020-05-22 DIAGNOSIS — I4891 Unspecified atrial fibrillation: Secondary | ICD-10-CM | POA: Insufficient documentation

## 2020-05-22 DIAGNOSIS — I499 Cardiac arrhythmia, unspecified: Secondary | ICD-10-CM | POA: Insufficient documentation

## 2020-05-22 DIAGNOSIS — E079 Disorder of thyroid, unspecified: Secondary | ICD-10-CM | POA: Insufficient documentation

## 2020-05-22 DIAGNOSIS — K760 Fatty (change of) liver, not elsewhere classified: Secondary | ICD-10-CM | POA: Insufficient documentation

## 2020-05-25 ENCOUNTER — Other Ambulatory Visit: Payer: Self-pay | Admitting: Family

## 2020-05-25 DIAGNOSIS — Z1231 Encounter for screening mammogram for malignant neoplasm of breast: Secondary | ICD-10-CM

## 2020-06-02 ENCOUNTER — Other Ambulatory Visit: Payer: Self-pay

## 2020-06-02 ENCOUNTER — Emergency Department (HOSPITAL_BASED_OUTPATIENT_CLINIC_OR_DEPARTMENT_OTHER)
Admission: EM | Admit: 2020-06-02 | Discharge: 2020-06-02 | Disposition: A | Discharge status: Home / Self Care | Payer: Medicare Other | Attending: Emergency Medicine | Admitting: Emergency Medicine

## 2020-06-02 ENCOUNTER — Encounter (HOSPITAL_BASED_OUTPATIENT_CLINIC_OR_DEPARTMENT_OTHER): Payer: Self-pay

## 2020-06-02 DIAGNOSIS — E039 Hypothyroidism, unspecified: Secondary | ICD-10-CM | POA: Diagnosis not present

## 2020-06-02 DIAGNOSIS — Z87891 Personal history of nicotine dependence: Secondary | ICD-10-CM | POA: Diagnosis not present

## 2020-06-02 DIAGNOSIS — R0981 Nasal congestion: Secondary | ICD-10-CM | POA: Diagnosis present

## 2020-06-02 DIAGNOSIS — U071 COVID-19: Secondary | ICD-10-CM

## 2020-06-02 DIAGNOSIS — J45909 Unspecified asthma, uncomplicated: Secondary | ICD-10-CM | POA: Diagnosis not present

## 2020-06-02 DIAGNOSIS — Z79899 Other long term (current) drug therapy: Secondary | ICD-10-CM | POA: Insufficient documentation

## 2020-06-02 DIAGNOSIS — I10 Essential (primary) hypertension: Secondary | ICD-10-CM | POA: Diagnosis not present

## 2020-06-02 DIAGNOSIS — D691 Qualitative platelet defects: Secondary | ICD-10-CM | POA: Insufficient documentation

## 2020-06-02 LAB — CBC WITH DIFFERENTIAL/PLATELET
Abs Immature Granulocytes: 0.02 10*3/uL (ref 0.00–0.07)
Basophils Absolute: 0 10*3/uL (ref 0.0–0.1)
Basophils Relative: 0 %
Eosinophils Absolute: 0.1 10*3/uL (ref 0.0–0.5)
Eosinophils Relative: 1 %
HCT: 39.3 % (ref 36.0–46.0)
Hemoglobin: 12.8 g/dL (ref 12.0–15.0)
Immature Granulocytes: 0 %
Lymphocytes Relative: 14 %
Lymphs Abs: 0.9 10*3/uL (ref 0.7–4.0)
MCH: 28.1 pg (ref 26.0–34.0)
MCHC: 32.6 g/dL (ref 30.0–36.0)
MCV: 86.2 fL (ref 80.0–100.0)
Monocytes Absolute: 0.7 10*3/uL (ref 0.1–1.0)
Monocytes Relative: 11 %
Neutro Abs: 4.5 10*3/uL (ref 1.7–7.7)
Neutrophils Relative %: 74 %
Platelets: 116 10*3/uL — ABNORMAL LOW (ref 150–400)
RBC: 4.56 MIL/uL (ref 3.87–5.11)
RDW: 14.4 % (ref 11.5–15.5)
WBC: 6.1 10*3/uL (ref 4.0–10.5)
nRBC: 0 % (ref 0.0–0.2)

## 2020-06-02 LAB — RESP PANEL BY RT-PCR (FLU A&B, COVID) ARPGX2
Influenza A by PCR: NEGATIVE — AB
Influenza B by PCR: NEGATIVE — AB
SARS Coronavirus 2 by RT PCR: POSITIVE — AB

## 2020-06-02 LAB — COMPREHENSIVE METABOLIC PANEL
ALT: 56 U/L — ABNORMAL HIGH (ref 0–44)
AST: 39 U/L (ref 15–41)
Albumin: 3.7 g/dL (ref 3.5–5.0)
Alkaline Phosphatase: 80 U/L (ref 38–126)
Anion gap: 8 (ref 5–15)
BUN: 21 mg/dL — ABNORMAL HIGH (ref 6–20)
CO2: 28 mmol/L (ref 22–32)
Calcium: 8.8 mg/dL — ABNORMAL LOW (ref 8.9–10.3)
Chloride: 100 mmol/L (ref 98–111)
Creatinine, Ser: 0.54 mg/dL (ref 0.44–1.00)
GFR, Estimated: >60 mL/min (ref >60)
Glucose, Bld: 130 mg/dL — ABNORMAL HIGH (ref 70–99)
Potassium: 3.7 mmol/L (ref 3.5–5.1)
Sodium: 136 mmol/L (ref 135–145)
Total Bilirubin: 0.5 mg/dL (ref 0.3–1.2)
Total Protein: 7.1 g/dL (ref 6.5–8.1)

## 2020-06-02 LAB — PROTIME-INR
INR: 1.1 (ref 0.8–1.2)
Prothrombin Time: 14.4 seconds (ref 11.4–15.2)

## 2020-06-02 MED ORDER — NIRMATRELVIR/RITONAVIR (PAXLOVID)TABLET: 3.0000 | ORAL_TABLET | Freq: Two times a day (BID) | ORAL | 0 refills | Status: AC
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
## 2020-06-02 NOTE — ED Triage Notes (Signed)
Patient here POV from Home with Fatigue, Headache, Congestion, Intermittent Fevers for 24 Hours.  Several Family Members Positive for COVID-19.   Hx of Cirrhosis. PCP recommended ER Visit to assess Liver Enzymes and Metabolic Panel.   Ambulatory, NAD, GCS 15.

## 2020-06-02 NOTE — ED Notes (Signed)
EDP Hong made aware of COVID-19 Test Results; no new orders at this time.

## 2020-06-02 NOTE — Discharge Instructions (Addendum)
If you had any testing done today, the results will show up on your MyChart phone app in 24 hours.  Call your primary care doctor in the next 1-2 days to arrange video follow-up.   Use a finger pulse oximeter at home.  You may purchase one at [facility] or [facility] or online.  If the numbers drops and stays below 90%, return immediately back to the ER.  Otherwise increase your fluid intake, isolate at home for 5 days after symptoms resolve, and inform recent close contacts of the need to test for Covid.  

## 2020-06-04 ENCOUNTER — Telehealth: Payer: Self-pay | Admitting: *Deleted

## 2020-06-04 NOTE — Telephone Encounter (Signed)
Patient went to ED on 06/02/20

## 2020-06-04 NOTE — Telephone Encounter (Signed)
Contact Type Call Who Is Calling Patient / Member / Family / Caregiver Call Type Triage / Clinical Relationship To Patient Self Return Phone Number [phone] (Primary) Chief Complaint Unclassified Symptom Reason for Call Symptomatic / Request for Health Information Initial Comment Caller states her grand daughter has covid and they advised to contact her dr to let her dr know she has been exposed to covid. She is sick Translation No Nurse Assessment Nurse: Hassell Done, RN, Melanie Date/Time (Eastern Time): 06/01/2020 10:05:35 PM Confirm and document reason for call. If symptomatic, describe symptoms. ---Caller states her grandaughter that lives with her tested positive for covid. She was told to contact her Dr and she has cirrhosis. Her liver Dr is at a conference. Her liver levels have been elevated for quite some time

## 2020-06-07 ENCOUNTER — Encounter: Payer: Medicare Other | Attending: General Surgery | Admitting: Skilled Nursing Facility1

## 2020-06-07 ENCOUNTER — Other Ambulatory Visit: Payer: Self-pay

## 2020-06-07 DIAGNOSIS — Z9884 Bariatric surgery status: Secondary | ICD-10-CM | POA: Insufficient documentation

## 2020-06-07 DIAGNOSIS — E669 Obesity, unspecified: Secondary | ICD-10-CM | POA: Diagnosis present

## 2020-06-07 DIAGNOSIS — Z713 Dietary counseling and surveillance: Secondary | ICD-10-CM | POA: Insufficient documentation

## 2020-06-07 NOTE — Progress Notes (Signed)
Follow-up visit:  Post-Operative sleeve Surgery  Medical Nutrition Therapy  Surgery: 04/11/2019 Surgery type: sleeve Start weight at [facility]: 313 Weight today: telephone visit; pt agreed to the limitations this visit type; pt identified by name and DOB   Body Composition Scale  12/27/2019 02/28/2020  Weight  lbs 222.1 195.6 185  Total Body Fat  % 41.5 38.3 35.9     Visceral Fat 13 11 9   Fat-Free Mass  % 58.4 61.6 64     Total Body Water  % 43.7 45.3 46.5     Muscle-Mass  lbs 32.4 31.5 32  BMI 34.5 31.1 28.7  Body Fat Displacement ---          Torso  lbs 57.1 46.4 41.1        Left Leg  lbs 11.4 9.2 8.2        Right Leg  lbs 11.4 9.2 8.2        Left Arm  lbs 5.7 4.6 4.1        Right Arm  lbs 5.7 4.6 4.1    Pt state she does currently have covid but feels pretty well overall. Pt states she has started a steroid due to her liver enzymes being high stating she will ned to be on them for a long while. Pt states her only other option for liver care will be a liver transplant.  Pt states she still has a lot of challenges with eating meat but can tolerate eggs. Pt states any food with oil or grease causes nausea for her. Pt states she has not tried tofu. Pt states she is going to try sushi this month. Pt states she did try meatles crumbles stating she does not like them but states she will try sushi. Pt states too many seasonings can make her feel not well.  Pt states she avoids trying new things due to fear of her stomach hurting. Pt states she is not into the whole microwave thing. Pt states she has not been doing BELT due to having to watch her grand kids. Pt states he is not trying to lose any more weight.   Labs: glucose 130, BUN 21, Calcium 8.8, ALT 56, B12 761  24hr recall: protein water and protein shakes and protein bars Breakfast: protein shake Snack: yogurt, granola bar Lunch:  Protein bar or boiled egg or [employer] broccoli and cheese soup or bean soup Snack: half peeled  apple Dinner: baked potato + splenda + butter or sweet potato Snack: granola bar + sugar free pudding   Beverages: water, sometimes protein water, orange juice (states she needs it for cramping)  Fluid intake: unknown  Medications: See List Supplementation: calcium and multi  Using straws: no Drinking while eating: no Having you been chewing well: yes Chewing/swallowing difficulties: no Changes in vision: no Changes to mood/headaches: no Hair loss/Cahnges to skin/Changes to nails: no Any difficulty focusing or concentrating: no Sweating: no Dizziness/Lightheaded: no Palpitations: no  Carbonated beverages: no N/V/D/C/GAS: no Abdominal Pain: no Dumping syndrome: no  Recent physical activity:  ADL's  Progress Towards Goal(s):  In Progress  Education: Importance of vegetables To have an overall healthy diet, adult men and women are recommended to consume anywhere from 2-3 cups of vegetables daily. Vegetables provide a wide range of vitamins and minerals such as vitamin A, vitamin C, potassium, and folic acid. According to the [employer], including fruit and vegetables daily may reduce the risk of cardiovascular disease, certain cancers, and other non-communicable diseases.  Encouraged patient to honor their body's internal hunger and fullness cues.  Throughout the day, check in mentally and rate hunger. Stop eating when satisfied not full regardless of how much food is left on the plate.  Get more if still hungry 20-30 minutes later.  The key is to honor satisfaction so throughout the meal, rate fullness factor and stop when comfortably satisfied not physically full. The key is to honor hunger and fullness without any feelings of guilt or shame.  Pay attention to what the internal cues are, rather than any external factors. This will enhance the confidence you have in listening to your own body and following those internal cues enabling you to increase how often you eat  when you are hungry not out of appetite and stop when you are satisfied not full.  Encouraged pt to continue to eat balanced meals inclusive of non starchy vegetables 2 times a day 7 days a week Encouraged pt to choose lean protein sources: limiting beef, pork, sausage, hotdogs, and lunch meat Encourage pt to choose healthy fats such as plant based limiting animal fats Encouraged pt to continue to drink a minium 64 fluid ounces with half being plain water to satisfy proper hydration   Goals: -try the fish on top of the rice without trying the rice -Aim to have non starchy vegetables 2 times a day 7 days a week -try [place]    Teaching Method Utilized:  Visual Auditory Hands on  Demonstrated degree of understanding via:  Teach Back   Monitoring/Evaluation:  Dietary intake, exercise, and body weight.

## 2020-06-13 ENCOUNTER — Ambulatory Visit: Payer: Medicare Other | Admitting: Cardiology

## 2020-06-18 ENCOUNTER — Ambulatory Visit
Admission: RE | Admit: 2020-06-18 | Discharge: 2020-06-18 | Disposition: A | Discharge status: Home / Self Care | Payer: Medicare Other | Source: Ambulatory Visit | Attending: Nurse Practitioner | Admitting: Nurse Practitioner

## 2020-06-18 DIAGNOSIS — K754 Autoimmune hepatitis: Secondary | ICD-10-CM

## 2020-06-18 DIAGNOSIS — K7469 Other cirrhosis of liver: Secondary | ICD-10-CM

## 2020-06-18 DIAGNOSIS — Z0389 Encounter for observation for other suspected diseases and conditions ruled out: Secondary | ICD-10-CM | POA: Diagnosis not present

## 2020-06-18 DIAGNOSIS — K760 Fatty (change of) liver, not elsewhere classified: Secondary | ICD-10-CM

## 2020-07-05 DIAGNOSIS — M47816 Spondylosis without myelopathy or radiculopathy, lumbar region: Secondary | ICD-10-CM | POA: Diagnosis not present

## 2020-07-05 DIAGNOSIS — M47812 Spondylosis without myelopathy or radiculopathy, cervical region: Secondary | ICD-10-CM | POA: Diagnosis not present

## 2020-07-05 DIAGNOSIS — M961 Postlaminectomy syndrome, not elsewhere classified: Secondary | ICD-10-CM | POA: Diagnosis not present

## 2020-07-05 DIAGNOSIS — G894 Chronic pain syndrome: Secondary | ICD-10-CM | POA: Diagnosis not present

## 2020-07-17 ENCOUNTER — Ambulatory Visit: Payer: Medicare Other | Admitting: Family

## 2020-07-18 ENCOUNTER — Ambulatory Visit: Payer: Medicare Other | Admitting: Family

## 2020-07-20 ENCOUNTER — Ambulatory Visit
Admission: RE | Admit: 2020-07-20 | Discharge: 2020-07-20 | Disposition: A | Discharge status: Home / Self Care | Payer: Medicare Other | Source: Ambulatory Visit | Attending: Family | Admitting: Family

## 2020-07-20 ENCOUNTER — Other Ambulatory Visit: Payer: Self-pay

## 2020-07-20 DIAGNOSIS — K754 Autoimmune hepatitis: Secondary | ICD-10-CM | POA: Diagnosis not present

## 2020-07-20 DIAGNOSIS — Z1231 Encounter for screening mammogram for malignant neoplasm of breast: Secondary | ICD-10-CM

## 2020-08-01 ENCOUNTER — Other Ambulatory Visit: Payer: Self-pay

## 2020-08-01 ENCOUNTER — Ambulatory Visit
Admission: RE | Admit: 2020-08-01 | Discharge: 2020-08-01 | Disposition: A | Discharge status: Home / Self Care | Payer: Medicare Other | Source: Ambulatory Visit | Attending: Family | Admitting: Family

## 2020-08-01 DIAGNOSIS — Z1231 Encounter for screening mammogram for malignant neoplasm of breast: Secondary | ICD-10-CM | POA: Diagnosis not present

## 2020-08-16 DIAGNOSIS — G894 Chronic pain syndrome: Secondary | ICD-10-CM | POA: Diagnosis not present

## 2020-08-16 DIAGNOSIS — M961 Postlaminectomy syndrome, not elsewhere classified: Secondary | ICD-10-CM | POA: Diagnosis not present

## 2020-08-16 DIAGNOSIS — M47812 Spondylosis without myelopathy or radiculopathy, cervical region: Secondary | ICD-10-CM | POA: Diagnosis not present

## 2020-08-16 DIAGNOSIS — M47816 Spondylosis without myelopathy or radiculopathy, lumbar region: Secondary | ICD-10-CM | POA: Diagnosis not present

## 2020-08-18 ENCOUNTER — Other Ambulatory Visit: Payer: Self-pay | Admitting: Family

## 2020-08-20 DIAGNOSIS — H0102A Squamous blepharitis right eye, upper and lower eyelids: Secondary | ICD-10-CM | POA: Diagnosis not present

## 2020-08-20 DIAGNOSIS — R7309 Other abnormal glucose: Secondary | ICD-10-CM | POA: Diagnosis not present

## 2020-08-20 DIAGNOSIS — H0102B Squamous blepharitis left eye, upper and lower eyelids: Secondary | ICD-10-CM | POA: Diagnosis not present

## 2020-08-20 LAB — HM DIABETES EYE EXAM

## 2020-08-21 ENCOUNTER — Ambulatory Visit (INDEPENDENT_AMBULATORY_CARE_PROVIDER_SITE_OTHER): Payer: Medicare Other | Admitting: Family

## 2020-08-21 ENCOUNTER — Other Ambulatory Visit: Payer: Self-pay

## 2020-08-21 VITALS — BP 121/78 | HR 64 | Temp 98.6°F | Resp 16 | Wt 207.0 lb

## 2020-08-21 DIAGNOSIS — K754 Autoimmune hepatitis: Secondary | ICD-10-CM

## 2020-08-21 DIAGNOSIS — E039 Hypothyroidism, unspecified: Secondary | ICD-10-CM | POA: Diagnosis not present

## 2020-08-21 DIAGNOSIS — I10 Essential (primary) hypertension: Secondary | ICD-10-CM | POA: Diagnosis not present

## 2020-08-21 NOTE — Assessment & Plan Note (Signed)
She is currently maintained on prednisone which she decreased on her own a few weeks ago from 15 to '5mg'$ .  I advised her to reach out to [facility] NP to alert her about this as she may wish to drop her dose more slowly. She is concerned about the weight gain on the prednisone. Discussed, diet/exercise.

## 2020-08-21 NOTE — Patient Instructions (Signed)
Please complete lab work prior to leaving.   

## 2020-08-21 NOTE — Assessment & Plan Note (Signed)
She is having some fatigue. Check TSH, continue synthroid 171mg once daily.
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
## 2020-08-21 NOTE — Assessment & Plan Note (Signed)
BP Readings from Last 3 Encounters:  08/21/20 121/78  06/02/20 105/66  05/04/20 115/72   BP stable. Not on antihypertensive. Monitor.

## 2020-08-22 LAB — COMPREHENSIVE METABOLIC PANEL
ALT: 34 U/L (ref 0–35)
AST: 37 U/L (ref 0–37)
Albumin: 3.9 g/dL (ref 3.5–5.2)
Alkaline Phosphatase: 76 U/L (ref 39–117)
BUN: 25 mg/dL — ABNORMAL HIGH (ref 6–23)
CO2: 31 mEq/L (ref 19–32)
Calcium: 8.9 mg/dL (ref 8.4–10.5)
Chloride: 100 mEq/L (ref 96–112)
Creatinine, Ser: 0.59 mg/dL (ref 0.40–1.20)
GFR: 100.05 mL/min (ref >60.00)
Glucose, Bld: 92 mg/dL (ref 70–99)
Potassium: 4.5 mEq/L (ref 3.5–5.1)
Sodium: 138 mEq/L (ref 135–145)
Total Bilirubin: 0.6 mg/dL (ref 0.2–1.2)
Total Protein: 7 g/dL (ref 6.0–8.3)

## 2020-08-22 LAB — TSH: TSH: 1.58 u[IU]/mL (ref 0.35–5.50)

## 2020-08-25 ENCOUNTER — Encounter: Payer: Self-pay | Admitting: Family

## 2020-08-27 NOTE — Progress Notes (Signed)
Mailed out to pt 

## 2020-09-03 DIAGNOSIS — K754 Autoimmune hepatitis: Secondary | ICD-10-CM | POA: Diagnosis not present

## 2020-09-03 DIAGNOSIS — K7469 Other cirrhosis of liver: Secondary | ICD-10-CM | POA: Diagnosis not present

## 2020-09-10 ENCOUNTER — Telehealth: Payer: Self-pay | Admitting: Family

## 2020-09-10 NOTE — Telephone Encounter (Signed)
Left message for patient to call back and schedule Medicare Annual Wellness Visit (AWV) in office.  ° °If not able to come in office, please offer to do virtually or by telephone.  Left office number and my jabber #[phone]. ° °Due for AWVI ° °Please schedule at anytime with Nurse Health Advisor. °  °

## 2020-09-12 ENCOUNTER — Ambulatory Visit: Payer: Medicare Other | Admitting: Cardiology

## 2020-09-18 DIAGNOSIS — M961 Postlaminectomy syndrome, not elsewhere classified: Secondary | ICD-10-CM | POA: Diagnosis not present

## 2020-09-18 DIAGNOSIS — G894 Chronic pain syndrome: Secondary | ICD-10-CM | POA: Diagnosis not present

## 2020-09-18 DIAGNOSIS — M47812 Spondylosis without myelopathy or radiculopathy, cervical region: Secondary | ICD-10-CM | POA: Diagnosis not present

## 2020-09-18 DIAGNOSIS — M47816 Spondylosis without myelopathy or radiculopathy, lumbar region: Secondary | ICD-10-CM | POA: Diagnosis not present

## 2020-09-19 ENCOUNTER — Other Ambulatory Visit: Payer: Self-pay | Admitting: Physical Medicine and Rehabilitation

## 2020-09-19 DIAGNOSIS — M5416 Radiculopathy, lumbar region: Secondary | ICD-10-CM

## 2020-09-19 DIAGNOSIS — M48061 Spinal stenosis, lumbar region without neurogenic claudication: Secondary | ICD-10-CM

## 2020-10-02 ENCOUNTER — Encounter: Payer: Self-pay | Admitting: Gastroenterology

## 2020-10-05 ENCOUNTER — Encounter: Payer: Medicare Other | Admitting: Family

## 2020-10-09 DIAGNOSIS — K754 Autoimmune hepatitis: Secondary | ICD-10-CM | POA: Diagnosis not present

## 2020-10-15 ENCOUNTER — Ambulatory Visit
Admission: RE | Admit: 2020-10-15 | Discharge: 2020-10-15 | Disposition: A | Discharge status: Home / Self Care | Payer: Medicare Other | Source: Ambulatory Visit | Attending: Physical Medicine and Rehabilitation | Admitting: Physical Medicine and Rehabilitation

## 2020-10-15 DIAGNOSIS — M5126 Other intervertebral disc displacement, lumbar region: Secondary | ICD-10-CM | POA: Diagnosis not present

## 2020-10-15 DIAGNOSIS — M5416 Radiculopathy, lumbar region: Secondary | ICD-10-CM

## 2020-10-15 DIAGNOSIS — M48061 Spinal stenosis, lumbar region without neurogenic claudication: Secondary | ICD-10-CM

## 2020-10-15 MED ORDER — GADOBUTROL 1 MMOL/ML IV SOLN
9.0000 mL | Freq: Once | INTRAVENOUS | Status: AC | PRN
  Administered 2020-10-15: 5 mL via INTRAVENOUS

## 2020-10-18 DIAGNOSIS — G894 Chronic pain syndrome: Secondary | ICD-10-CM | POA: Diagnosis not present

## 2020-10-18 DIAGNOSIS — M47816 Spondylosis without myelopathy or radiculopathy, lumbar region: Secondary | ICD-10-CM | POA: Diagnosis not present

## 2020-10-18 DIAGNOSIS — Z79891 Long term (current) use of opiate analgesic: Secondary | ICD-10-CM | POA: Diagnosis not present

## 2020-10-18 DIAGNOSIS — M47812 Spondylosis without myelopathy or radiculopathy, cervical region: Secondary | ICD-10-CM | POA: Diagnosis not present

## 2020-10-18 DIAGNOSIS — M961 Postlaminectomy syndrome, not elsewhere classified: Secondary | ICD-10-CM | POA: Diagnosis not present

## 2020-10-23 ENCOUNTER — Ambulatory Visit: Payer: Medicare Other | Admitting: Cardiology

## 2020-11-09 ENCOUNTER — Ambulatory Visit: Payer: Medicare Other | Admitting: Cardiology

## 2020-11-12 DIAGNOSIS — K754 Autoimmune hepatitis: Secondary | ICD-10-CM | POA: Diagnosis not present

## 2020-11-12 DIAGNOSIS — K7469 Other cirrhosis of liver: Secondary | ICD-10-CM | POA: Diagnosis not present

## 2020-11-13 DIAGNOSIS — K7469 Other cirrhosis of liver: Secondary | ICD-10-CM | POA: Diagnosis not present

## 2020-11-13 DIAGNOSIS — K754 Autoimmune hepatitis: Secondary | ICD-10-CM | POA: Diagnosis not present

## 2020-11-13 DIAGNOSIS — D696 Thrombocytopenia, unspecified: Secondary | ICD-10-CM

## 2020-11-13 HISTORY — DX: Thrombocytopenia, unspecified: D69.6

## 2020-11-14 ENCOUNTER — Other Ambulatory Visit: Payer: Self-pay | Admitting: Nurse Practitioner

## 2020-11-14 DIAGNOSIS — K754 Autoimmune hepatitis: Secondary | ICD-10-CM

## 2020-11-16 ENCOUNTER — Other Ambulatory Visit: Payer: Self-pay | Admitting: Family

## 2020-11-20 DIAGNOSIS — M961 Postlaminectomy syndrome, not elsewhere classified: Secondary | ICD-10-CM | POA: Diagnosis not present

## 2020-11-20 DIAGNOSIS — M47816 Spondylosis without myelopathy or radiculopathy, lumbar region: Secondary | ICD-10-CM | POA: Diagnosis not present

## 2020-11-20 DIAGNOSIS — G894 Chronic pain syndrome: Secondary | ICD-10-CM | POA: Diagnosis not present

## 2020-11-20 DIAGNOSIS — M47812 Spondylosis without myelopathy or radiculopathy, cervical region: Secondary | ICD-10-CM | POA: Diagnosis not present

## 2020-11-29 DIAGNOSIS — H0011 Chalazion right upper eyelid: Secondary | ICD-10-CM | POA: Diagnosis not present

## 2020-11-29 DIAGNOSIS — H0102A Squamous blepharitis right eye, upper and lower eyelids: Secondary | ICD-10-CM | POA: Diagnosis not present

## 2020-11-29 DIAGNOSIS — H0102B Squamous blepharitis left eye, upper and lower eyelids: Secondary | ICD-10-CM | POA: Diagnosis not present

## 2020-12-20 DIAGNOSIS — M961 Postlaminectomy syndrome, not elsewhere classified: Secondary | ICD-10-CM | POA: Diagnosis not present

## 2020-12-20 DIAGNOSIS — M47812 Spondylosis without myelopathy or radiculopathy, cervical region: Secondary | ICD-10-CM | POA: Diagnosis not present

## 2020-12-20 DIAGNOSIS — G894 Chronic pain syndrome: Secondary | ICD-10-CM | POA: Diagnosis not present

## 2020-12-20 DIAGNOSIS — M47816 Spondylosis without myelopathy or radiculopathy, lumbar region: Secondary | ICD-10-CM | POA: Diagnosis not present

## 2020-12-26 ENCOUNTER — Ambulatory Visit: Payer: Medicare Other | Admitting: Cardiology
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
## 2021-01-15 ENCOUNTER — Encounter: Payer: Self-pay | Admitting: Cardiology

## 2021-01-15 ENCOUNTER — Other Ambulatory Visit: Payer: Self-pay

## 2021-01-15 ENCOUNTER — Ambulatory Visit (INDEPENDENT_AMBULATORY_CARE_PROVIDER_SITE_OTHER): Payer: Medicare Other | Admitting: Cardiology

## 2021-01-15 VITALS — BP 135/80 | HR 62 | Ht 67.0 in | Wt 213.8 lb

## 2021-01-15 DIAGNOSIS — I119 Hypertensive heart disease without heart failure: Secondary | ICD-10-CM | POA: Diagnosis not present

## 2021-01-15 DIAGNOSIS — I471 Supraventricular tachycardia: Secondary | ICD-10-CM

## 2021-01-15 NOTE — Patient Instructions (Signed)

## 2021-01-16 ENCOUNTER — Encounter: Payer: Self-pay | Admitting: Hematology & Oncology

## 2021-01-18 ENCOUNTER — Other Ambulatory Visit: Payer: Medicare Other

## 2021-01-22 ENCOUNTER — Telehealth: Payer: Self-pay | Admitting: Family

## 2021-01-22 DIAGNOSIS — K7469 Other cirrhosis of liver: Secondary | ICD-10-CM | POA: Diagnosis not present

## 2021-01-22 DIAGNOSIS — M47812 Spondylosis without myelopathy or radiculopathy, cervical region: Secondary | ICD-10-CM | POA: Diagnosis not present

## 2021-01-22 DIAGNOSIS — K754 Autoimmune hepatitis: Secondary | ICD-10-CM | POA: Diagnosis not present

## 2021-01-22 DIAGNOSIS — M47816 Spondylosis without myelopathy or radiculopathy, lumbar region: Secondary | ICD-10-CM | POA: Diagnosis not present

## 2021-01-22 DIAGNOSIS — M961 Postlaminectomy syndrome, not elsewhere classified: Secondary | ICD-10-CM | POA: Diagnosis not present

## 2021-01-22 DIAGNOSIS — G894 Chronic pain syndrome: Secondary | ICD-10-CM | POA: Diagnosis not present

## 2021-01-22 NOTE — Telephone Encounter (Signed)
Left message for patient to call back and schedule Medicare Annual Wellness Visit (AWV) in office.  ° °If not able to come in office, please offer to do virtually or by telephone.  Left office number and my jabber #[phone]. ° °Due for AWVI ° °Please schedule at anytime with Nurse Health Advisor. °  °

## 2021-01-23 ENCOUNTER — Other Ambulatory Visit: Payer: Medicare Other

## 2021-01-29 DIAGNOSIS — M533 Sacrococcygeal disorders, not elsewhere classified: Secondary | ICD-10-CM | POA: Diagnosis not present

## 2021-01-29 DIAGNOSIS — M47816 Spondylosis without myelopathy or radiculopathy, lumbar region: Secondary | ICD-10-CM | POA: Diagnosis not present

## 2021-01-31 ENCOUNTER — Encounter: Payer: Self-pay | Admitting: Hematology & Oncology

## 2021-02-07 ENCOUNTER — Other Ambulatory Visit: Payer: Medicaid Other

## 2021-02-11 ENCOUNTER — Encounter: Payer: Self-pay | Admitting: Hematology & Oncology

## 2021-02-17 ENCOUNTER — Other Ambulatory Visit: Payer: Self-pay | Admitting: Family

## 2021-02-18 ENCOUNTER — Ambulatory Visit
Admission: RE | Admit: 2021-02-18 | Discharge: 2021-02-18 | Disposition: A | Discharge status: Home / Self Care | Payer: Medicare Other | Source: Ambulatory Visit | Attending: Nurse Practitioner | Admitting: Nurse Practitioner

## 2021-02-18 ENCOUNTER — Other Ambulatory Visit: Payer: Self-pay

## 2021-02-18 DIAGNOSIS — Z9049 Acquired absence of other specified parts of digestive tract: Secondary | ICD-10-CM | POA: Diagnosis not present

## 2021-02-18 DIAGNOSIS — K754 Autoimmune hepatitis: Secondary | ICD-10-CM

## 2021-02-18 DIAGNOSIS — R161 Splenomegaly, not elsewhere classified: Secondary | ICD-10-CM | POA: Diagnosis not present

## 2021-02-21 ENCOUNTER — Ambulatory Visit: Payer: Medicare Other

## 2021-02-21 DIAGNOSIS — H0102B Squamous blepharitis left eye, upper and lower eyelids: Secondary | ICD-10-CM | POA: Diagnosis not present

## 2021-02-21 DIAGNOSIS — H0102A Squamous blepharitis right eye, upper and lower eyelids: Secondary | ICD-10-CM | POA: Diagnosis not present

## 2021-02-21 DIAGNOSIS — H0011 Chalazion right upper eyelid: Secondary | ICD-10-CM | POA: Diagnosis not present
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
## 2021-02-22 ENCOUNTER — Ambulatory Visit (INDEPENDENT_AMBULATORY_CARE_PROVIDER_SITE_OTHER): Payer: Medicare Other

## 2021-02-22 VITALS — Ht 67.0 in | Wt 213.0 lb

## 2021-02-22 DIAGNOSIS — Z780 Asymptomatic menopausal state: Secondary | ICD-10-CM | POA: Diagnosis not present

## 2021-02-22 DIAGNOSIS — Z0000 Encounter for general adult medical examination without abnormal findings: Secondary | ICD-10-CM

## 2021-02-22 NOTE — Patient Instructions (Signed)
Megan [name] , Thank you for taking time to complete your Medicare Wellness Visit. I appreciate your ongoing commitment to your health goals. Please review the following plan we discussed and let me know if I can assist you in the future.   Screening recommendations/referrals: Colonoscopy: Completed 10/25/2019-Due 10/24/2024 Mammogram: Completed 08/01/2020-Due 08/01/2021 Bone Density: Ordered today. Someone will call you to schedule. Recommended yearly ophthalmology/optometry visit for glaucoma screening and checkup Recommended yearly dental visit for hygiene and checkup  Vaccinations: Influenza vaccine: Due-May obtain vaccine at our office or your local pharmacy. Pneumococcal vaccine: Due at age 94 Tdap vaccine: Up  to date Shingles vaccine: Due-May obtain vaccine at your local pharmacy. Covid-19: Unable to tolerate  Advanced directives: Information mailed today  Conditions/risks identified: See problem list  Next appointment: Follow up in one year for your annual wellness visit.   Preventive Care 40-64 Years, Female Preventive care refers to lifestyle choices and visits with your health care provider that can promote health and wellness. What does preventive care include? A yearly physical exam. This is also called an annual well check. Dental exams once or twice a year. Routine eye exams. Ask your health care provider how often you should have your eyes checked. Personal lifestyle choices, including: Daily care of your teeth and gums. Regular physical activity. Eating a healthy diet. Avoiding tobacco and drug use. Limiting alcohol use. Practicing safe sex. Taking low-dose aspirin daily starting at age 52. Taking vitamin and mineral supplements as recommended by your health care provider. What happens during an annual well check? The services and screenings done by your health care provider during your annual well check will depend on your age, overall health, lifestyle risk  factors, and family history of disease. Counseling  Your health care provider may ask you questions about your: Alcohol use. Tobacco use. Drug use. Emotional well-being. Home and relationship well-being. Sexual activity. Eating habits. Work and work Statistician. Method of birth control. Menstrual cycle. Pregnancy history. Screening  You may have the following tests or measurements: Height, weight, and BMI. Blood pressure. Lipid and cholesterol levels. These may be checked every 5 years, or more frequently if you are over 27 years old. Skin check. Lung cancer screening. You may have this screening every year starting at age 33 if you have a 30-pack-year history of smoking and currently smoke or have quit within the past 15 years. Fecal occult blood test (FOBT) of the stool. You may have this test every year starting at age 32. Flexible sigmoidoscopy or colonoscopy. You may have a sigmoidoscopy every 5 years or a colonoscopy every 10 years starting at age 36. Hepatitis C blood test. Hepatitis B blood test. Sexually transmitted disease (STD) testing. Diabetes screening. This is done by checking your blood sugar (glucose) after you have not eaten for a while (fasting). You may have this done every 1-3 years. Mammogram. This may be done every 1-2 years. Talk to your health care provider about when you should start having regular mammograms. This may depend on whether you have a family history of breast cancer. BRCA-related cancer screening. This may be done if you have a family history of breast, ovarian, tubal, or peritoneal cancers. Pelvic exam and Pap test. This may be done every 3 years starting at age 55. Starting at age 71, this may be done every 5 years if you have a Pap test in combination with an HPV test. Bone density scan. This is done to screen for osteoporosis. You may have this  scan if you are at high risk for osteoporosis. Discuss your test results, treatment options, and if  necessary, the need for more tests with your health care provider. Vaccines  Your health care provider may recommend certain vaccines, such as: Influenza vaccine. This is recommended every year. Tetanus, diphtheria, and acellular pertussis (Tdap, Td) vaccine. You may need a Td booster every 10 years. Zoster vaccine. You may need this after age 49. Pneumococcal 13-valent conjugate (PCV13) vaccine. You may need this if you have certain conditions and were not previously vaccinated. Pneumococcal polysaccharide (PPSV23) vaccine. You may need one or two doses if you smoke cigarettes or if you have certain conditions. Talk to your health care provider about which screenings and vaccines you need and how often you need them. This information is not intended to replace advice given to you by your health care provider. Make sure you discuss any questions you have with your health care provider. Document Released: 01/26/2015 Document Revised: 09/19/2015 Document Reviewed: 10/31/2014 Elsevier Interactive Patient Education  2017 [place] Prevention in the Home Falls can cause injuries. They can happen to people of all ages. There are many things you can do to make your home safe and to help prevent falls. What can I do on the outside of my home? Regularly fix the edges of walkways and driveways and fix any cracks. Remove anything that might make you trip as you walk through a door, such as a raised step or threshold. Trim any bushes or trees on the path to your home. Use bright outdoor lighting. Clear any walking paths of anything that might make someone trip, such as rocks or tools. Regularly check to see if handrails are loose or broken. Make sure that both sides of any steps have handrails. Any raised decks and porches should have guardrails on the edges. Have any leaves, snow, or ice cleared regularly. Use sand or salt on walking paths during winter. Clean up any spills in your  garage right away. This includes oil or grease spills. What can I do in the bathroom? Use night lights. Install grab bars by the toilet and in the tub and shower. Do not use towel bars as grab bars. Use non-skid mats or decals in the tub or shower. If you need to sit down in the shower, use a plastic, non-slip stool. Keep the floor dry. Clean up any water that spills on the floor as soon as it happens. Remove soap buildup in the tub or shower regularly. Attach bath mats securely with double-sided non-slip rug tape. Do not have throw rugs and other things on the floor that can make you trip. What can I do in the bedroom? Use night lights. Make sure that you have a light by your bed that is easy to reach. Do not use any sheets or blankets that are too big for your bed. They should not hang down onto the floor. Have a firm chair that has side arms. You can use this for support while you get dressed. Do not have throw rugs and other things on the floor that can make you trip. What can I do in the kitchen? Clean up any spills right away. Avoid walking on wet floors. Keep items that you use a lot in easy-to-reach places. If you need to reach something above you, use a strong step stool that has a grab bar. Keep electrical cords out of the way. Do not use floor polish or wax  that makes floors slippery. If you must use wax, use non-skid floor wax. Do not have throw rugs and other things on the floor that can make you trip. What can I do with my stairs? Do not leave any items on the stairs. Make sure that there are handrails on both sides of the stairs and use them. Fix handrails that are broken or loose. Make sure that handrails are as long as the stairways. Check any carpeting to make sure that it is firmly attached to the stairs. Fix any carpet that is loose or worn. Avoid having throw rugs at the top or bottom of the stairs. If you do have throw rugs, attach them to the floor with carpet  tape. Make sure that you have a light switch at the top of the stairs and the bottom of the stairs. If you do not have them, ask someone to add them for you. What else can I do to help prevent falls? Wear shoes that: Do not have high heels. Have rubber bottoms. Are comfortable and fit you well. Are closed at the toe. Do not wear sandals. If you use a stepladder: Make sure that it is fully opened. Do not climb a closed stepladder. Make sure that both sides of the stepladder are locked into place. Ask someone to hold it for you, if possible. Clearly mark and make sure that you can see: Any grab bars or handrails. First and last steps. Where the edge of each step is. Use tools that help you move around (mobility aids) if they are needed. These include: Canes. Walkers. Scooters. Crutches. Turn on the lights when you go into a dark area. Replace any light bulbs as soon as they burn out. Set up your furniture so you have a clear path. Avoid moving your furniture around. If any of your floors are uneven, fix them. If there are any pets around you, be aware of where they are. Review your medicines with your doctor. Some medicines can make you feel dizzy. This can increase your chance of falling. Ask your doctor what other things that you can do to help prevent falls. This information is not intended to replace advice given to you by your health care provider. Make sure you discuss any questions you have with your health care provider. Document Released: 10/26/2008 Document Revised: 06/07/2015 Document Reviewed: 02/03/2014 Elsevier Interactive Patient Education  2017 [employer].
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
## 2021-02-25 ENCOUNTER — Telehealth: Payer: Self-pay

## 2021-02-25 NOTE — Telephone Encounter (Signed)
° °  Telephone encounter was:  Successful.  02/25/2021 Name: Megan [name] MRN: [id number] DOB: 11/24/1965  Mailing resources to Patient as reqested  .al

## 2021-02-25 NOTE — Telephone Encounter (Signed)
° °  Telephone encounter was:  Successful.  02/25/2021 Name: DESHANNA KAMA MRN: [id number] DOB: 1965-02-03  Wynelle Link is a 56 y.o. year old female who is a primary care patient of [name], NP . The community resource team was consulted for assistance with Food Insecurity, Financial Difficulties related to [place], and MEDICATION  Care guide performed the following interventions: Patient provided with information about care guide support team and interviewed to confirm resource needs.Patient stated her daughter passed and now has custody of grandchildren everything is so expensive and she is financially struggling  Follow Up Plan:  Care guide will follow up with patient by phone over the next week   [facility]  [phone] [address], [place], [place] [address] Phone: [phone] Email: [email or web address]

## 2021-02-26 DIAGNOSIS — M961 Postlaminectomy syndrome, not elsewhere classified: Secondary | ICD-10-CM | POA: Diagnosis not present

## 2021-02-26 DIAGNOSIS — M47812 Spondylosis without myelopathy or radiculopathy, cervical region: Secondary | ICD-10-CM | POA: Diagnosis not present

## 2021-02-26 DIAGNOSIS — G894 Chronic pain syndrome: Secondary | ICD-10-CM | POA: Diagnosis not present

## 2021-02-26 DIAGNOSIS — M47816 Spondylosis without myelopathy or radiculopathy, lumbar region: Secondary | ICD-10-CM | POA: Diagnosis not present

## 2021-03-12 ENCOUNTER — Telehealth: Payer: Self-pay

## 2021-03-12 NOTE — Telephone Encounter (Signed)
° °  Telephone encounter was:  Successful.  03/12/2021 Name: Megan [name] MRN: [id number] DOB: [birth year]  Megan [name] is a 56 y.o. year old female who is a primary care patient of [name], NP . The community resource team was consulted for assistance with [place] guide performed the following interventions: Patient provided with information about care guide support team and interviewed to confirm resource needs.follow up for mailed resources and she did receive resources  Follow Up Plan:  No further follow up planned at this time. The patient has been provided with needed resources.    [facility]  [phone] [address], [place], [place] [address] Phone: [phone] Email: [email or web address]

## 2021-03-18 DIAGNOSIS — M533 Sacrococcygeal disorders, not elsewhere classified: Secondary | ICD-10-CM | POA: Diagnosis not present

## 2021-03-18 DIAGNOSIS — M5416 Radiculopathy, lumbar region: Secondary | ICD-10-CM | POA: Diagnosis not present

## 2021-03-18 DIAGNOSIS — M47816 Spondylosis without myelopathy or radiculopathy, lumbar region: Secondary | ICD-10-CM | POA: Diagnosis not present

## 2021-03-18 DIAGNOSIS — Z9889 Other specified postprocedural states: Secondary | ICD-10-CM | POA: Diagnosis not present

## 2021-03-28 DIAGNOSIS — G894 Chronic pain syndrome: Secondary | ICD-10-CM | POA: Diagnosis not present

## 2021-03-28 DIAGNOSIS — M47816 Spondylosis without myelopathy or radiculopathy, lumbar region: Secondary | ICD-10-CM | POA: Diagnosis not present

## 2021-03-28 DIAGNOSIS — M961 Postlaminectomy syndrome, not elsewhere classified: Secondary | ICD-10-CM | POA: Diagnosis not present

## 2021-03-28 DIAGNOSIS — M47812 Spondylosis without myelopathy or radiculopathy, cervical region: Secondary | ICD-10-CM | POA: Diagnosis not present

## 2021-04-16 DIAGNOSIS — M47816 Spondylosis without myelopathy or radiculopathy, lumbar region: Secondary | ICD-10-CM | POA: Diagnosis not present

## 2021-04-16 DIAGNOSIS — M533 Sacrococcygeal disorders, not elsewhere classified: Secondary | ICD-10-CM | POA: Diagnosis not present

## 2021-04-16 DIAGNOSIS — M5416 Radiculopathy, lumbar region: Secondary | ICD-10-CM | POA: Diagnosis not present

## 2021-04-23 ENCOUNTER — Telehealth (INDEPENDENT_AMBULATORY_CARE_PROVIDER_SITE_OTHER): Payer: Medicare Other | Admitting: Family

## 2021-04-23 DIAGNOSIS — J019 Acute sinusitis, unspecified: Secondary | ICD-10-CM

## 2021-04-23 MED ORDER — AZELASTINE HCL 0.1 % NA SOLN: 1.0000 | Freq: Two times a day (BID) | NASAL | 2 refills | Status: DC

## 2021-04-23 MED ORDER — DOXYCYCLINE HYCLATE 100 MG PO CAPS: 100.0000 mg | ORAL_CAPSULE | Freq: Two times a day (BID) | ORAL | 0 refills | Status: AC
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
## 2021-04-23 NOTE — Assessment & Plan Note (Signed)
New.  Sounds like illness began with a virus and has now developed into a bacterial sinusitis. Will rx with doxycyline.  Pt is advised to call if symptoms worsen or if symptoms fail to improve.  ?

## 2021-04-23 NOTE — Patient Instructions (Addendum)
Please begin doxycycline (antibiotic). ?Call if symptoms worsen or if symptoms do not improve.  ?

## 2021-04-30 DIAGNOSIS — G894 Chronic pain syndrome: Secondary | ICD-10-CM | POA: Diagnosis not present

## 2021-04-30 DIAGNOSIS — M961 Postlaminectomy syndrome, not elsewhere classified: Secondary | ICD-10-CM | POA: Diagnosis not present

## 2021-04-30 DIAGNOSIS — M47816 Spondylosis without myelopathy or radiculopathy, lumbar region: Secondary | ICD-10-CM | POA: Diagnosis not present

## 2021-04-30 DIAGNOSIS — M47812 Spondylosis without myelopathy or radiculopathy, cervical region: Secondary | ICD-10-CM | POA: Diagnosis not present

## 2021-05-14 ENCOUNTER — Encounter: Payer: Medicare Other | Admitting: Family

## 2021-05-18 ENCOUNTER — Other Ambulatory Visit: Payer: Self-pay | Admitting: Family

## 2021-05-30 DIAGNOSIS — M961 Postlaminectomy syndrome, not elsewhere classified: Secondary | ICD-10-CM | POA: Diagnosis not present

## 2021-05-30 DIAGNOSIS — M47816 Spondylosis without myelopathy or radiculopathy, lumbar region: Secondary | ICD-10-CM | POA: Diagnosis not present

## 2021-05-30 DIAGNOSIS — M47812 Spondylosis without myelopathy or radiculopathy, cervical region: Secondary | ICD-10-CM | POA: Diagnosis not present

## 2021-05-30 DIAGNOSIS — G894 Chronic pain syndrome: Secondary | ICD-10-CM | POA: Diagnosis not present

## 2021-06-04 ENCOUNTER — Encounter: Payer: Medicare Other | Admitting: Family

## 2021-07-02 DIAGNOSIS — M47812 Spondylosis without myelopathy or radiculopathy, cervical region: Secondary | ICD-10-CM | POA: Diagnosis not present

## 2021-07-02 DIAGNOSIS — G894 Chronic pain syndrome: Secondary | ICD-10-CM | POA: Diagnosis not present

## 2021-07-02 DIAGNOSIS — M47816 Spondylosis without myelopathy or radiculopathy, lumbar region: Secondary | ICD-10-CM | POA: Diagnosis not present

## 2021-07-02 DIAGNOSIS — M961 Postlaminectomy syndrome, not elsewhere classified: Secondary | ICD-10-CM | POA: Diagnosis not present

## 2021-07-12 ENCOUNTER — Other Ambulatory Visit: Payer: Self-pay | Admitting: Family

## 2021-07-12 DIAGNOSIS — Z1231 Encounter for screening mammogram for malignant neoplasm of breast: Secondary | ICD-10-CM

## 2021-07-19 ENCOUNTER — Other Ambulatory Visit: Payer: Self-pay | Admitting: Family

## 2021-07-22 ENCOUNTER — Encounter: Payer: Self-pay | Admitting: Physician Assistant

## 2021-07-22 ENCOUNTER — Telehealth (INDEPENDENT_AMBULATORY_CARE_PROVIDER_SITE_OTHER): Payer: Medicare Other | Admitting: Physician Assistant

## 2021-07-22 VITALS — Ht 67.0 in | Wt 213.0 lb

## 2021-07-22 DIAGNOSIS — L237 Allergic contact dermatitis due to plants, except food: Secondary | ICD-10-CM | POA: Diagnosis not present

## 2021-07-22 DIAGNOSIS — L03113 Cellulitis of right upper limb: Secondary | ICD-10-CM

## 2021-07-22 MED ORDER — DOXYCYCLINE HYCLATE 100 MG PO TABS: 100.0000 mg | ORAL_TABLET | Freq: Two times a day (BID) | ORAL | 0 refills | Status: AC

## 2021-07-22 MED ORDER — METHYLPREDNISOLONE 4 MG PO TBPK: ORAL_TABLET | ORAL | 0 refills | Status: DC

## 2021-07-22 NOTE — Progress Notes (Signed)
   Virtual Visit via Video Note  I connected with  Megan [name]  on 07/22/21 at 11:45 AM EDT by a video enabled telemedicine application and verified that I am speaking with the correct person using two identifiers.  Location: Patient: home Provider: Therapist, music at [place] present: Patient and myself   I discussed the limitations of evaluation and management by telemedicine and the availability of in person appointments. The patient expressed understanding and agreed to proceed.   History of Present Illness:  56 yo female presents for virtual visit for probable poison oak or ivy dermatitis. Has had bad allergies / sensitivity to it since childhood. [place] to pull it out by root this weekend, now has rash on both arms, worse on right, as well as torso. Very itchy. Using Technu lotion with some relief. Concerned about 2/2 skin infection on R arm now. No fever. No wheezing. Some weeping from R arm sites.    Observations/Objective:   Gen: Awake, alert, no acute distress Resp: Breathing is even and non-labored Psych: calm/pleasant demeanor Neuro: Alert and Oriented x 3, + facial symmetry, speech is clear.  Skin: Two small erythematous patches left arm with few vesicles; R arm has large erythematous patches with clear weeping vesicles, some honey crusting appearance.    Assessment and Plan:  1. Plant allergic contact dermatitis 2. Right arm cellulitis -medrol dose pak and doxycycline as directed -may cont use of Technu OTC -fluids, keep cool, keep vesicles wrapped -antihistamine daily -recheck prn  Follow Up Instructions:    I discussed the assessment and treatment plan with the patient. The patient was provided an opportunity to ask questions and all were answered. The patient agreed with the plan and demonstrated an understanding of the instructions.   The patient was advised to call back or seek an in-person  evaluation if the symptoms worsen or if the condition fails to improve as anticipated.  Harsha Yusko M Bernhardt Riemenschneider, PA-C

## 2021-08-06 DIAGNOSIS — M47812 Spondylosis without myelopathy or radiculopathy, cervical region: Secondary | ICD-10-CM | POA: Diagnosis not present

## 2021-08-06 DIAGNOSIS — G894 Chronic pain syndrome: Secondary | ICD-10-CM | POA: Diagnosis not present

## 2021-08-06 DIAGNOSIS — M47816 Spondylosis without myelopathy or radiculopathy, lumbar region: Secondary | ICD-10-CM | POA: Diagnosis not present

## 2021-08-06 DIAGNOSIS — M961 Postlaminectomy syndrome, not elsewhere classified: Secondary | ICD-10-CM | POA: Diagnosis not present

## 2021-08-19 DIAGNOSIS — K754 Autoimmune hepatitis: Secondary | ICD-10-CM | POA: Diagnosis not present

## 2021-08-19 DIAGNOSIS — K7469 Other cirrhosis of liver: Secondary | ICD-10-CM | POA: Diagnosis not present

## 2021-08-20 ENCOUNTER — Encounter: Payer: Medicare Other | Admitting: Family

## 2021-08-26 ENCOUNTER — Ambulatory Visit: Payer: Medicare Other

## 2021-08-26 ENCOUNTER — Other Ambulatory Visit: Payer: Self-pay | Admitting: Nurse Practitioner

## 2021-08-26 DIAGNOSIS — K760 Fatty (change of) liver, not elsewhere classified: Secondary | ICD-10-CM | POA: Diagnosis not present

## 2021-08-26 DIAGNOSIS — K7469 Other cirrhosis of liver: Secondary | ICD-10-CM

## 2021-08-26 DIAGNOSIS — K754 Autoimmune hepatitis: Secondary | ICD-10-CM | POA: Diagnosis not present

## 2021-09-02 ENCOUNTER — Ambulatory Visit
Admission: RE | Admit: 2021-09-02 | Discharge: 2021-09-02 | Disposition: A | Discharge status: Home / Self Care | Payer: Medicare Other | Source: Ambulatory Visit | Attending: Nurse Practitioner | Admitting: Nurse Practitioner

## 2021-09-02 DIAGNOSIS — K7469 Other cirrhosis of liver: Secondary | ICD-10-CM

## 2021-09-02 DIAGNOSIS — Z9049 Acquired absence of other specified parts of digestive tract: Secondary | ICD-10-CM | POA: Diagnosis not present

## 2021-09-03 DIAGNOSIS — Z79891 Long term (current) use of opiate analgesic: Secondary | ICD-10-CM | POA: Diagnosis not present

## 2021-09-03 DIAGNOSIS — M47816 Spondylosis without myelopathy or radiculopathy, lumbar region: Secondary | ICD-10-CM | POA: Diagnosis not present

## 2021-09-03 DIAGNOSIS — G894 Chronic pain syndrome: Secondary | ICD-10-CM | POA: Diagnosis not present

## 2021-09-03 DIAGNOSIS — M961 Postlaminectomy syndrome, not elsewhere classified: Secondary | ICD-10-CM | POA: Diagnosis not present

## 2021-09-03 DIAGNOSIS — M47812 Spondylosis without myelopathy or radiculopathy, cervical region: Secondary | ICD-10-CM | POA: Diagnosis not present

## 2021-09-10 ENCOUNTER — Ambulatory Visit: Payer: Medicare Other

## 2021-09-13 ENCOUNTER — Encounter: Payer: Self-pay | Admitting: Family

## 2021-09-13 ENCOUNTER — Ambulatory Visit (INDEPENDENT_AMBULATORY_CARE_PROVIDER_SITE_OTHER): Payer: Medicare Other | Admitting: Family

## 2021-09-13 VITALS — BP 101/73 | HR 108 | Temp 98.6°F | Resp 16 | Ht 66.0 in | Wt 222.0 lb

## 2021-09-13 DIAGNOSIS — K754 Autoimmune hepatitis: Secondary | ICD-10-CM | POA: Diagnosis not present

## 2021-09-13 DIAGNOSIS — R3129 Other microscopic hematuria: Secondary | ICD-10-CM

## 2021-09-13 DIAGNOSIS — I10 Essential (primary) hypertension: Secondary | ICD-10-CM

## 2021-09-13 DIAGNOSIS — Z23 Encounter for immunization: Secondary | ICD-10-CM

## 2021-09-13 DIAGNOSIS — E039 Hypothyroidism, unspecified: Secondary | ICD-10-CM | POA: Diagnosis not present

## 2021-09-13 DIAGNOSIS — Z0000 Encounter for general adult medical examination without abnormal findings: Secondary | ICD-10-CM | POA: Diagnosis not present

## 2021-09-13 MED ORDER — SHINGRIX 50 MCG/0.5ML IM SUSR: INTRAMUSCULAR | 1 refills | Status: DC

## 2021-09-13 MED ORDER — LEVOTHYROXINE SODIUM 175 MCG PO TABS: ORAL_TABLET | ORAL | 1 refills | Status: DC
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
## 2021-09-14 LAB — TSH: TSH: 0.11 mIU/L — ABNORMAL LOW (ref 0.40–4.50)

## 2021-09-15 ENCOUNTER — Telehealth: Payer: Self-pay | Admitting: Family

## 2021-09-15 MED ORDER — LEVOTHYROXINE SODIUM 150 MCG PO TABS: 150.0000 ug | ORAL_TABLET | Freq: Every day | ORAL | 0 refills | Status: DC

## 2021-09-15 NOTE — Telephone Encounter (Signed)
Lab work shows that synthroid dose should be lowered. I would like her to decrease to 150 mcg daily and repeat tsh in 6 weeks. Dx hypothyroid.

## 2021-09-16 ENCOUNTER — Telehealth: Payer: Self-pay | Admitting: Family

## 2021-09-16 ENCOUNTER — Encounter: Payer: Self-pay | Admitting: Family

## 2021-09-16 ENCOUNTER — Other Ambulatory Visit: Payer: Self-pay | Admitting: Family

## 2021-09-16 NOTE — Assessment & Plan Note (Addendum)
She had a remote work up with Dr. Karsten Ro at [facility], but I never received their notes.  Will request.  Addendum: reviewed notes from 2013.  Had negative CT scan and urine was sent for cytology.

## 2021-09-16 NOTE — Assessment & Plan Note (Signed)
BP Readings from Last 3 Encounters:  09/13/21 101/73  01/15/21 135/80  08/21/20 121/78   bp stable. Continue toprol.

## 2021-09-16 NOTE — Assessment & Plan Note (Signed)
She continues to follow with hepatology.  ?

## 2021-09-16 NOTE — Assessment & Plan Note (Signed)
Maintained on Synthroid, check follow up tsh.

## 2021-09-16 NOTE — Telephone Encounter (Signed)
Please request records from [facility].

## 2021-09-17 ENCOUNTER — Other Ambulatory Visit: Payer: Self-pay

## 2021-09-17 DIAGNOSIS — E039 Hypothyroidism, unspecified: Secondary | ICD-10-CM

## 2021-09-17 NOTE — Telephone Encounter (Signed)
Records release faxed 

## 2021-09-17 NOTE — Telephone Encounter (Signed)
MyChart message sent to patient with this information and to call for lab appointment tsh check in 6 weeks. Order for test entered as future.

## 2021-09-26 ENCOUNTER — Encounter: Payer: Self-pay | Admitting: Family

## 2021-10-01 ENCOUNTER — Ambulatory Visit: Payer: Medicare Other

## 2021-10-29 ENCOUNTER — Ambulatory Visit: Payer: Medicare Other

## 2021-10-31 DIAGNOSIS — M961 Postlaminectomy syndrome, not elsewhere classified: Secondary | ICD-10-CM | POA: Diagnosis not present

## 2021-10-31 DIAGNOSIS — G894 Chronic pain syndrome: Secondary | ICD-10-CM | POA: Diagnosis not present

## 2021-10-31 DIAGNOSIS — M47816 Spondylosis without myelopathy or radiculopathy, lumbar region: Secondary | ICD-10-CM | POA: Diagnosis not present

## 2021-10-31 DIAGNOSIS — M47812 Spondylosis without myelopathy or radiculopathy, cervical region: Secondary | ICD-10-CM | POA: Diagnosis not present

## 2021-11-01 ENCOUNTER — Encounter (HOSPITAL_COMMUNITY): Payer: Self-pay | Admitting: *Deleted

## 2021-11-08 DIAGNOSIS — H0102B Squamous blepharitis left eye, upper and lower eyelids: Secondary | ICD-10-CM | POA: Diagnosis not present

## 2021-11-08 DIAGNOSIS — H0102A Squamous blepharitis right eye, upper and lower eyelids: Secondary | ICD-10-CM | POA: Diagnosis not present

## 2021-11-08 DIAGNOSIS — H0011 Chalazion right upper eyelid: Secondary | ICD-10-CM | POA: Diagnosis not present

## 2021-12-02 DIAGNOSIS — M47812 Spondylosis without myelopathy or radiculopathy, cervical region: Secondary | ICD-10-CM | POA: Diagnosis not present

## 2021-12-02 DIAGNOSIS — G894 Chronic pain syndrome: Secondary | ICD-10-CM | POA: Diagnosis not present

## 2021-12-02 DIAGNOSIS — M47816 Spondylosis without myelopathy or radiculopathy, lumbar region: Secondary | ICD-10-CM | POA: Diagnosis not present

## 2021-12-02 DIAGNOSIS — M961 Postlaminectomy syndrome, not elsewhere classified: Secondary | ICD-10-CM | POA: Diagnosis not present

## 2021-12-03 ENCOUNTER — Telehealth (INDEPENDENT_AMBULATORY_CARE_PROVIDER_SITE_OTHER): Payer: Medicare Other | Admitting: Family

## 2021-12-03 ENCOUNTER — Encounter: Payer: Self-pay | Admitting: Family

## 2021-12-03 DIAGNOSIS — H00014 Hordeolum externum left upper eyelid: Secondary | ICD-10-CM

## 2021-12-03 DIAGNOSIS — E039 Hypothyroidism, unspecified: Secondary | ICD-10-CM | POA: Diagnosis not present

## 2021-12-03 DIAGNOSIS — J069 Acute upper respiratory infection, unspecified: Secondary | ICD-10-CM | POA: Insufficient documentation

## 2021-12-03 MED ORDER — LEVOTHYROXINE SODIUM 150 MCG PO TABS: 150.0000 ug | ORAL_TABLET | Freq: Every day | ORAL | 0 refills | Status: DC
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
## 2021-12-03 NOTE — Patient Instructions (Addendum)
Please apply warm compresses to stye twice daily as needed. Wash eyelids with baby shampoo and warm water gently once daily to help prevent styes.

## 2021-12-04 ENCOUNTER — Encounter: Payer: Self-pay | Admitting: Family

## 2021-12-04 DIAGNOSIS — H00014 Hordeolum externum left upper eyelid: Secondary | ICD-10-CM

## 2021-12-04 HISTORY — DX: Hordeolum externum left upper eyelid: H00.014

## 2021-12-04 NOTE — Assessment & Plan Note (Signed)
Discussed warm compresses.  Wash eyelids gently with warm water/baby shampoo once daily to help prevent recurrent styes.

## 2021-12-04 NOTE — Assessment & Plan Note (Signed)
Requesting refill of synthroid.  Due for tsh. She will call to schedule a lab appointment.

## 2021-12-04 NOTE — Assessment & Plan Note (Signed)
Discussed supportive measures including fluids, mucinex.  She is advised to call if symptoms worsen or if not improved in 3-4 days.

## 2021-12-12 ENCOUNTER — Ambulatory Visit: Payer: Medicare Other

## 2021-12-14 ENCOUNTER — Encounter: Payer: Self-pay | Admitting: Family

## 2021-12-15 ENCOUNTER — Other Ambulatory Visit: Payer: Self-pay | Admitting: Family

## 2021-12-20 ENCOUNTER — Ambulatory Visit
Admission: RE | Admit: 2021-12-20 | Discharge: 2021-12-20 | Disposition: A | Discharge status: Home / Self Care | Payer: Medicare Other | Source: Ambulatory Visit | Attending: Family | Admitting: Family

## 2021-12-20 DIAGNOSIS — Z1231 Encounter for screening mammogram for malignant neoplasm of breast: Secondary | ICD-10-CM | POA: Diagnosis not present

## 2021-12-24 ENCOUNTER — Encounter: Payer: Self-pay | Admitting: Family

## 2021-12-25 ENCOUNTER — Encounter: Payer: Self-pay | Admitting: Family

## 2021-12-30 ENCOUNTER — Encounter: Payer: Self-pay | Admitting: Family

## 2021-12-31 ENCOUNTER — Telehealth: Payer: Self-pay | Admitting: Family

## 2021-12-31 DIAGNOSIS — E039 Hypothyroidism, unspecified: Secondary | ICD-10-CM

## 2021-12-31 NOTE — Telephone Encounter (Signed)
Pt called stating that she was due to get a recheck on her TSH levels. After reviewing chart, advised pt that an active order was not seen and a message would have to be sent to have that order put in. Pt acknowledged understanding and asked if they could be put in today so that she can go ahead and get them done. Please advise.

## 2022-01-01 DIAGNOSIS — I499 Cardiac arrhythmia, unspecified: Secondary | ICD-10-CM | POA: Insufficient documentation

## 2022-01-01 DIAGNOSIS — M961 Postlaminectomy syndrome, not elsewhere classified: Secondary | ICD-10-CM | POA: Diagnosis not present

## 2022-01-01 DIAGNOSIS — G894 Chronic pain syndrome: Secondary | ICD-10-CM | POA: Diagnosis not present

## 2022-01-01 DIAGNOSIS — M47816 Spondylosis without myelopathy or radiculopathy, lumbar region: Secondary | ICD-10-CM | POA: Diagnosis not present

## 2022-01-01 DIAGNOSIS — M47812 Spondylosis without myelopathy or radiculopathy, cervical region: Secondary | ICD-10-CM | POA: Diagnosis not present

## 2022-01-01 DIAGNOSIS — Z9884 Bariatric surgery status: Secondary | ICD-10-CM | POA: Diagnosis not present

## 2022-01-01 NOTE — Telephone Encounter (Signed)
Pt called and scheduled

## 2022-01-03 ENCOUNTER — Other Ambulatory Visit: Payer: Medicare Other

## 2022-01-07 ENCOUNTER — Other Ambulatory Visit (INDEPENDENT_AMBULATORY_CARE_PROVIDER_SITE_OTHER): Payer: Medicare Other

## 2022-01-07 DIAGNOSIS — E039 Hypothyroidism, unspecified: Secondary | ICD-10-CM

## 2022-01-08 LAB — TSH: TSH: 0.43 u[IU]/mL (ref 0.35–5.50)

## 2022-01-14 ENCOUNTER — Telehealth: Payer: Medicare Other | Admitting: Physician Assistant

## 2022-01-14 DIAGNOSIS — B9689 Other specified bacterial agents as the cause of diseases classified elsewhere: Secondary | ICD-10-CM

## 2022-01-14 DIAGNOSIS — J069 Acute upper respiratory infection, unspecified: Secondary | ICD-10-CM

## 2022-01-14 MED ORDER — BENZONATATE 100 MG PO CAPS: 100.0000 mg | ORAL_CAPSULE | Freq: Three times a day (TID) | ORAL | 0 refills | Status: DC | PRN

## 2022-01-14 MED ORDER — ALBUTEROL SULFATE HFA 108 (90 BASE) MCG/ACT IN AERS: 2.0000 | INHALATION_SPRAY | Freq: Four times a day (QID) | RESPIRATORY_TRACT | 0 refills | Status: DC | PRN

## 2022-01-14 MED ORDER — DOXYCYCLINE HYCLATE 100 MG PO TABS: 100.0000 mg | ORAL_TABLET | Freq: Two times a day (BID) | ORAL | 0 refills | Status: DC

## 2022-01-14 NOTE — Progress Notes (Signed)
I have spent 5 minutes in review of e-visit questionnaire, review and updating patient chart, medical decision making and response to patient.   Yousuf Ager Cody Nicholus Chandran, PA-C    

## 2022-01-14 NOTE — Progress Notes (Signed)

## 2022-01-14 NOTE — Addendum Note (Signed)
Addended by: Brunetta Jeans on: 01/14/2022 03:34 PM   Modules accepted: Orders

## 2022-01-24 ENCOUNTER — Encounter: Payer: Self-pay | Admitting: Hematology & Oncology

## 2022-02-04 DIAGNOSIS — M47812 Spondylosis without myelopathy or radiculopathy, cervical region: Secondary | ICD-10-CM | POA: Diagnosis not present

## 2022-02-04 DIAGNOSIS — M47816 Spondylosis without myelopathy or radiculopathy, lumbar region: Secondary | ICD-10-CM | POA: Diagnosis not present

## 2022-02-04 DIAGNOSIS — G894 Chronic pain syndrome: Secondary | ICD-10-CM | POA: Diagnosis not present

## 2022-02-04 DIAGNOSIS — M961 Postlaminectomy syndrome, not elsewhere classified: Secondary | ICD-10-CM | POA: Diagnosis not present

## 2022-02-24 ENCOUNTER — Ambulatory Visit (INDEPENDENT_AMBULATORY_CARE_PROVIDER_SITE_OTHER): Payer: 59 | Admitting: *Deleted

## 2022-02-24 DIAGNOSIS — Z0000 Encounter for general adult medical examination without abnormal findings: Secondary | ICD-10-CM | POA: Diagnosis not present

## 2022-02-24 NOTE — Patient Instructions (Signed)
Megan [name] , Thank you for taking time to come for your Medicare Wellness Visit. I appreciate your ongoing commitment to your health goals. Please review the following plan we discussed and let me know if I can assist you in the future.   These are the goals we discussed:  Goals      Patient Stated     Would like to be more physically fit. Increase walking & join the gym.        This is a list of the screening recommended for you and due dates:  Health Maintenance  Topic Date Due   HIV Screening  Never done   Hepatitis C Screening: USPSTF Recommendation to screen - Ages 98-79 yo.  Never done   Zoster (Shingles) Vaccine (1 of 2) Never done   Yearly kidney health urinalysis for diabetes  12/29/2012   COVID-19 Vaccine (3 - Pfizer risk series) 08/25/2019   Eye exam for diabetics  08/20/2021   Yearly kidney function blood test for diabetes  08/21/2021   Pap Smear  11/29/2022   Medicare Annual Wellness Visit  02/25/2023   Mammogram  12/21/2023   DTaP/Tdap/Td vaccine (2 - Td or Tdap) 07/18/2024   Colon Cancer Screening  10/24/2029   Flu Shot  Completed   HPV Vaccine  Aged Out   Complete foot exam   Discontinued   Hemoglobin A1C  Discontinued      Next appointment: Follow up in one year for your annual wellness visit.   Preventive Care 40-64 Years, Female Preventive care refers to lifestyle choices and visits with your health care provider that can promote health and wellness. What does preventive care include? A yearly physical exam. This is also called an annual well check. Dental exams once or twice a year. Routine eye exams. Ask your health care provider how often you should have your eyes checked. Personal lifestyle choices, including: Daily care of your teeth and gums. Regular physical activity. Eating a healthy diet. Avoiding tobacco and drug use. Limiting alcohol use. Practicing safe sex. Taking low-dose aspirin daily starting at age 72. Taking vitamin and mineral  supplements as recommended by your health care provider. What happens during an annual well check? The services and screenings done by your health care provider during your annual well check will depend on your age, overall health, lifestyle risk factors, and family history of disease. Counseling  Your health care provider may ask you questions about your: Alcohol use. Tobacco use. Drug use. Emotional well-being. Home and relationship well-being. Sexual activity. Eating habits. Work and work Statistician. Method of birth control. Menstrual cycle. Pregnancy history. Screening  You may have the following tests or measurements: Height, weight, and BMI. Blood pressure. Lipid and cholesterol levels. These may be checked every 5 years, or more frequently if you are over 12 years old. Skin check. Lung cancer screening. You may have this screening every year starting at age 69 if you have a 30-pack-year history of smoking and currently smoke or have quit within the past 15 years. Fecal occult blood test (FOBT) of the stool. You may have this test every year starting at age 33. Flexible sigmoidoscopy or colonoscopy. You may have a sigmoidoscopy every 5 years or a colonoscopy every 10 years starting at age 18. Hepatitis C blood test. Hepatitis B blood test. Sexually transmitted disease (STD) testing. Diabetes screening. This is done by checking your blood sugar (glucose) after you have not eaten for a while (fasting). You may have this done  every 1-3 years. Mammogram. This may be done every 1-2 years. Talk to your health care provider about when you should start having regular mammograms. This may depend on whether you have a family history of breast cancer. BRCA-related cancer screening. This may be done if you have a family history of breast, ovarian, tubal, or peritoneal cancers. Pelvic exam and Pap test. This may be done every 3 years starting at age 69. Starting at age 36, this may be done  every 5 years if you have a Pap test in combination with an HPV test. Bone density scan. This is done to screen for osteoporosis. You may have this scan if you are at high risk for osteoporosis. Discuss your test results, treatment options, and if necessary, the need for more tests with your health care provider. Vaccines  Your health care provider may recommend certain vaccines, such as: Influenza vaccine. This is recommended every year. Tetanus, diphtheria, and acellular pertussis (Tdap, Td) vaccine. You may need a Td booster every 10 years. Zoster vaccine. You may need this after age 15. Pneumococcal 13-valent conjugate (PCV13) vaccine. You may need this if you have certain conditions and were not previously vaccinated. Pneumococcal polysaccharide (PPSV23) vaccine. You may need one or two doses if you smoke cigarettes or if you have certain conditions. Talk to your health care provider about which screenings and vaccines you need and how often you need them. This information is not intended to replace advice given to you by your health care provider. Make sure you discuss any questions you have with your health care provider. Document Released: 01/26/2015 Document Revised: 09/19/2015 Document Reviewed: 10/31/2014 Elsevier Interactive Patient Education  2017 [place] Prevention in the Home Falls can cause injuries. They can happen to people of all ages. There are many things you can do to make your home safe and to help prevent falls. What can I do on the outside of my home? Regularly fix the edges of walkways and driveways and fix any cracks. Remove anything that might make you trip as you walk through a door, such as a raised step or threshold. Trim any bushes or trees on the path to your home. Use bright outdoor lighting. Clear any walking paths of anything that might make someone trip, such as rocks or tools. Regularly check to see if handrails are loose or broken. Make  sure that both sides of any steps have handrails. Any raised decks and porches should have guardrails on the edges. Have any leaves, snow, or ice cleared regularly. Use sand or salt on walking paths during winter. Clean up any spills in your garage right away. This includes oil or grease spills. What can I do in the bathroom? Use night lights. Install grab bars by the toilet and in the tub and shower. Do not use towel bars as grab bars. Use non-skid mats or decals in the tub or shower. If you need to sit down in the shower, use a plastic, non-slip stool. Keep the floor dry. Clean up any water that spills on the floor as soon as it happens. Remove soap buildup in the tub or shower regularly. Attach bath mats securely with double-sided non-slip rug tape. Do not have throw rugs and other things on the floor that can make you trip. What can I do in the bedroom? Use night lights. Make sure that you have a light by your bed that is easy to reach. Do not use any sheets  or blankets that are too big for your bed. They should not hang down onto the floor. Have a firm chair that has side arms. You can use this for support while you get dressed. Do not have throw rugs and other things on the floor that can make you trip. What can I do in the kitchen? Clean up any spills right away. Avoid walking on wet floors. Keep items that you use a lot in easy-to-reach places. If you need to reach something above you, use a strong step stool that has a grab bar. Keep electrical cords out of the way. Do not use floor polish or wax that makes floors slippery. If you must use wax, use non-skid floor wax. Do not have throw rugs and other things on the floor that can make you trip. What can I do with my stairs? Do not leave any items on the stairs. Make sure that there are handrails on both sides of the stairs and use them. Fix handrails that are broken or loose. Make sure that handrails are as long as the  stairways. Check any carpeting to make sure that it is firmly attached to the stairs. Fix any carpet that is loose or worn. Avoid having throw rugs at the top or bottom of the stairs. If you do have throw rugs, attach them to the floor with carpet tape. Make sure that you have a light switch at the top of the stairs and the bottom of the stairs. If you do not have them, ask someone to add them for you. What else can I do to help prevent falls? Wear shoes that: Do not have high heels. Have rubber bottoms. Are comfortable and fit you well. Are closed at the toe. Do not wear sandals. If you use a stepladder: Make sure that it is fully opened. Do not climb a closed stepladder. Make sure that both sides of the stepladder are locked into place. Ask someone to hold it for you, if possible. Clearly mark and make sure that you can see: Any grab bars or handrails. First and last steps. Where the edge of each step is. Use tools that help you move around (mobility aids) if they are needed. These include: Canes. Walkers. Scooters. Crutches. Turn on the lights when you go into a dark area. Replace any light bulbs as soon as they burn out. Set up your furniture so you have a clear path. Avoid moving your furniture around. If any of your floors are uneven, fix them. If there are any pets around you, be aware of where they are. Review your medicines with your doctor. Some medicines can make you feel dizzy. This can increase your chance of falling. Ask your doctor what other things that you can do to help prevent falls. This information is not intended to replace advice given to you by your health care provider. Make sure you discuss any questions you have with your health care provider. Document Released: 10/26/2008 Document Revised: 06/07/2015 Document Reviewed: 02/03/2014 Elsevier Interactive Patient Education  2017 [employer].
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
## 2022-02-28 ENCOUNTER — Telehealth: Payer: Self-pay | Admitting: Gastroenterology

## 2022-02-28 NOTE — Telephone Encounter (Signed)
Received MyChart message from patient that stated:  Reason:  To address the following health maintenance concerns: Hepatitis C Screening Diabetic Kidney Evaluation - Urine ACR Diabetic Kidney Evaluation - EGFR

## 2022-03-03 NOTE — Telephone Encounter (Signed)
My chart message sent to pt.

## 2022-03-04 ENCOUNTER — Other Ambulatory Visit: Payer: Self-pay | Admitting: Family

## 2022-03-06 DIAGNOSIS — M47816 Spondylosis without myelopathy or radiculopathy, lumbar region: Secondary | ICD-10-CM | POA: Diagnosis not present

## 2022-03-06 DIAGNOSIS — M47812 Spondylosis without myelopathy or radiculopathy, cervical region: Secondary | ICD-10-CM | POA: Diagnosis not present

## 2022-03-06 DIAGNOSIS — M961 Postlaminectomy syndrome, not elsewhere classified: Secondary | ICD-10-CM | POA: Diagnosis not present

## 2022-03-06 DIAGNOSIS — G894 Chronic pain syndrome: Secondary | ICD-10-CM | POA: Diagnosis not present

## 2022-03-11 ENCOUNTER — Telehealth: Payer: Self-pay | Admitting: Family

## 2022-03-11 NOTE — Telephone Encounter (Signed)
Prescription Request  03/11/2022  Is this a "Controlled Substance" medicine? No  LOV: 09/13/2021  What is the name of the medication or equipment? SYNTHROID 150 MCG table   Have you contacted your pharmacy to request a refill? No   Which pharmacy would you like this sent to?  [id number] [place],  - [id number] [id number]: [phone]: [phone]   Patient notified that their request is being sent to the clinical staff for review and that they should receive a response within 2 business days.   [phone]

## 2022-03-12 NOTE — Telephone Encounter (Signed)
Rx was sent 03/04/2022

## 2022-03-18 ENCOUNTER — Ambulatory Visit: Payer: 59 | Admitting: Family

## 2022-03-28 ENCOUNTER — Telehealth: Payer: 59 | Admitting: Physician Assistant

## 2022-03-28 DIAGNOSIS — J029 Acute pharyngitis, unspecified: Secondary | ICD-10-CM | POA: Diagnosis not present

## 2022-03-28 DIAGNOSIS — Z20818 Contact with and (suspected) exposure to other bacterial communicable diseases: Secondary | ICD-10-CM

## 2022-03-28 MED ORDER — CEPHALEXIN 500 MG PO CAPS: 500.0000 mg | ORAL_CAPSULE | Freq: Two times a day (BID) | ORAL | 0 refills | Status: AC

## 2022-03-28 NOTE — Progress Notes (Signed)
I have spent 5 minutes in review of e-visit questionnaire, review and updating patient chart, medical decision making and response to patient.   Jimy Gates Cody Addysen Louth, PA-C    

## 2022-03-28 NOTE — Progress Notes (Signed)
E-Visit for Sore Throat - Strep Symptoms ? ?We are sorry that you are not feeling well.  Here is how we plan to help! ? ?Based on what you have shared with me it is likely that you have strep pharyngitis.  Strep pharyngitis is inflammation and infection in the back of the throat.  This is an infection cause by bacteria and is treated with antibiotics.  I have prescribed Cephalexin 500 mg twice a day for 10 days. For throat pain, we recommend over the counter oral pain relief medications such as acetaminophen or aspirin, or anti-inflammatory medications such as ibuprofen or naproxen sodium. Topical treatments such as oral throat lozenges or sprays may be used as needed. Strep infections are not as easily transmitted as other respiratory infections, however we still recommend that you avoid close contact with loved ones, especially the very young and elderly.  Remember to wash your hands thoroughly throughout the day as this is the number one way to prevent the spread of infection and wipe down door knobs and counters with disinfectant. ? ? ?Home Care: ?Only take medications as instructed by your medical team. ?Complete the entire course of an antibiotic. ?Do not take these medications with alcohol. ?A steam or ultrasonic humidifier can help congestion.  You can place a towel over your head and breathe in the steam from hot water coming from a faucet. ?Avoid close contacts especially the very young and the elderly. ?Cover your mouth when you cough or sneeze. ?Always remember to wash your hands. ? ?Get Help Right Away If: ?You develop worsening fever or sinus pain. ?You develop a severe head ache or visual changes. ?Your symptoms persist after you have completed your treatment plan. ? ?Make sure you ?Understand these instructions. ?Will watch your condition. ?Will get help right away if you are not doing well or get worse. ? ? ?Thank you for choosing an e-visit. ? ?Your e-visit answers were reviewed by a board  certified advanced clinical practitioner to complete your personal care plan. Depending upon the condition, your plan could have included both over the counter or prescription medications. ? ?Please review your pharmacy choice. Make sure the pharmacy is open so you can pick up prescription now. If there is a problem, you may contact your provider through MyChart messaging and have the prescription routed to another pharmacy.  Your safety is important to us. If you have drug allergies check your prescription carefully.  ? ?For the next 24 hours you can use MyChart to ask questions about today's visit, request a non-urgent call back, or ask for a work or school excuse. ?You will get an email in the next two days asking about your experience. I hope that your e-visit has been valuable and will speed your recovery. ? ?

## 2022-04-08 ENCOUNTER — Telehealth: Payer: Self-pay | Admitting: Cardiology

## 2022-04-08 DIAGNOSIS — K754 Autoimmune hepatitis: Secondary | ICD-10-CM | POA: Diagnosis not present

## 2022-04-08 DIAGNOSIS — R002 Palpitations: Secondary | ICD-10-CM

## 2022-04-08 DIAGNOSIS — K760 Fatty (change of) liver, not elsewhere classified: Secondary | ICD-10-CM | POA: Diagnosis not present

## 2022-04-08 DIAGNOSIS — K7469 Other cirrhosis of liver: Secondary | ICD-10-CM | POA: Diagnosis not present

## 2022-04-08 NOTE — Telephone Encounter (Signed)
Patient c/o Palpitations:  High priority if patient c/o lightheadedness, shortness of breath, or chest pain  How long have you had palpitations/irregular HR/ Afib? Are you having the symptoms now? Started today that she is aware of   Are you currently experiencing lightheadedness, SOB or CP? No   Do you have a history of afib (atrial fibrillation) or irregular heart rhythm? No   Have you checked your BP or HR? (document readings if available): 119/73 HR 87   Are you experiencing any other symptoms? Extremely tired and a lot of headaches   Liver Doctor notified her she was having "misfires" at her appt today. Recommended she make Dr. Bettina Gavia aware and be seen regarding it. Patient is willing to see another provider to stay in [place] due to distance. Please advise.

## 2022-04-08 NOTE — Telephone Encounter (Signed)
Spoke with pt who states that her transplant doctor noted her heart rate being irregular. Pt states she had palpations at times but not that it bothers her. Not sure if you wanted a monitor prior to being seen in [facility]. Please advise.

## 2022-04-09 ENCOUNTER — Ambulatory Visit: Payer: 59

## 2022-04-09 NOTE — Telephone Encounter (Signed)
Recommendations reviewed with pt as per Dr. Munley's note.  Pt verbalized understanding and had no additional questions.  

## 2022-04-09 NOTE — Addendum Note (Signed)
Addended by: Truddie Hidden on: 04/09/2022 12:55 PM   Modules accepted: Orders

## 2022-04-10 ENCOUNTER — Ambulatory Visit: Payer: 59 | Attending: Cardiology | Admitting: Cardiology

## 2022-04-10 ENCOUNTER — Encounter: Payer: Self-pay | Admitting: Cardiology

## 2022-04-10 ENCOUNTER — Telehealth: Payer: Self-pay | Admitting: Family

## 2022-04-10 ENCOUNTER — Other Ambulatory Visit: Payer: Self-pay | Admitting: Nurse Practitioner

## 2022-04-10 ENCOUNTER — Ambulatory Visit (INDEPENDENT_AMBULATORY_CARE_PROVIDER_SITE_OTHER): Payer: 59

## 2022-04-10 VITALS — BP 100/70 | HR 88 | Ht 67.0 in | Wt 224.0 lb

## 2022-04-10 DIAGNOSIS — I4891 Unspecified atrial fibrillation: Secondary | ICD-10-CM | POA: Insufficient documentation

## 2022-04-10 DIAGNOSIS — I10 Essential (primary) hypertension: Secondary | ICD-10-CM

## 2022-04-10 DIAGNOSIS — R002 Palpitations: Secondary | ICD-10-CM

## 2022-04-10 DIAGNOSIS — I480 Paroxysmal atrial fibrillation: Secondary | ICD-10-CM

## 2022-04-10 DIAGNOSIS — M47816 Spondylosis without myelopathy or radiculopathy, lumbar region: Secondary | ICD-10-CM | POA: Diagnosis not present

## 2022-04-10 DIAGNOSIS — K760 Fatty (change of) liver, not elsewhere classified: Secondary | ICD-10-CM

## 2022-04-10 DIAGNOSIS — G894 Chronic pain syndrome: Secondary | ICD-10-CM | POA: Diagnosis not present

## 2022-04-10 DIAGNOSIS — M47812 Spondylosis without myelopathy or radiculopathy, cervical region: Secondary | ICD-10-CM | POA: Diagnosis not present

## 2022-04-10 DIAGNOSIS — K754 Autoimmune hepatitis: Secondary | ICD-10-CM

## 2022-04-10 DIAGNOSIS — M961 Postlaminectomy syndrome, not elsewhere classified: Secondary | ICD-10-CM | POA: Diagnosis not present

## 2022-04-10 DIAGNOSIS — K7469 Other cirrhosis of liver: Secondary | ICD-10-CM

## 2022-04-10 DIAGNOSIS — E063 Autoimmune thyroiditis: Secondary | ICD-10-CM

## 2022-04-10 DIAGNOSIS — Z9884 Bariatric surgery status: Secondary | ICD-10-CM | POA: Diagnosis not present

## 2022-04-10 HISTORY — DX: Paroxysmal atrial fibrillation: I48.0

## 2022-04-10 NOTE — Patient Instructions (Signed)
Medication Instructions:  Your physician recommends that you continue on your current medications as directed. Please refer to the Current Medication list given to you today.  *If you need a refill on your cardiac medications before your next appointment, please call your pharmacy*   Lab Work: None Ordered If you have labs (blood work) drawn today and your tests are completely normal, you will receive your results only by: [place] (if you have MyChart) OR A paper copy in the mail If you have any lab test that is abnormal or we need to change your treatment, we will call you to review the results.   Testing/Procedures: Your physician has requested that you have an echocardiogram. Echocardiography is a painless test that uses sound waves to create images of your heart. It provides your doctor with information about the size and shape of your heart and how well your heart's chambers and valves are working. This procedure takes approximately one hour. There are no restrictions for this procedure. Please do NOT wear cologne, perfume, aftershave, or lotions (deodorant is allowed). Please arrive 15 minutes prior to your appointment time.   WHY IS MY DOCTOR PRESCRIBING ZIO? The Zio system is proven and trusted by physicians to detect and diagnose irregular heart rhythms -- and has been prescribed to hundreds of thousands of patients.  The FDA has cleared the Zio system to monitor for many different kinds of irregular heart rhythms. In a study, physicians were able to reach a diagnosis 90% of the time with the Zio system1.  You can wear the Zio monitor -- a small, discreet, comfortable patch -- during your normal day-to-day activity, including while you sleep, shower, and exercise, while it records every single heartbeat for analysis.  1Barrett, P., et al. Comparison of 24 Hour Holter Monitoring Versus 14 Day Novel Adhesive Patch Electrocardiographic Monitoring. [place],  2014.  ZIO VS. HOLTER MONITORING The Zio monitor can be comfortably worn for up to 14 days. Holter monitors can be worn for 24 to 48 hours, limiting the time to record any irregular heart rhythms you may have. Zio is able to capture data for the 51% of patients who have their first symptom-triggered arrhythmia after 48 hours.1  LIVE WITHOUT RESTRICTIONS The Zio ambulatory cardiac monitor is a small, unobtrusive, and water-resistant patch--you might even forget you're wearing it. The Zio monitor records and stores every beat of your heart, whether you're sleeping, working out, or showering.     Follow-Up: At [facility], you and your health needs are our priority.  As part of our continuing mission to provide you with exceptional heart care, we have created designated Provider Care Teams.  These Care Teams include your primary Cardiologist (physician) and Advanced Practice Providers (APPs -  Physician Assistants and Nurse Practitioners) who all work together to provide you with the care you need, when you need it.  We recommend signing up for the patient portal called "MyChart".  Sign up information is provided on this After Visit Summary.  MyChart is used to connect with patients for Virtual Visits (Telemedicine).  Patients are able to view lab/test results, encounter notes, upcoming appointments, etc.  Non-urgent messages can be sent to your provider as well.   To learn more about what you can do with MyChart, go to [email or web address].    Your next appointment:   1 month(s)  The format for your next appointment:   In Person  Provider:   Jenne Campus, MD  Other Instructions NA  

## 2022-04-10 NOTE — Progress Notes (Signed)
7lu  Nurse Visit   Date of Encounter: 04/10/2022 ID: JAVEN MARIUS, DOB 1965-09-27, MRN [id number]  PCP:  [name], NP   [facility] Providers Cardiologist:  None      Visit Details   VS:  LMP 04/10/2013 Comment: tubel ligation , BMI There is no height or weight on file to calculate BMI.  Wt Readings from Last 3 Encounters:  09/13/21 222 lb (100.7 kg)  07/22/21 213 lb (96.6 kg)  02/22/21 213 lb (96.6 kg)     Reason for visit: EKG Performed today: EKG, vitals Changes (medications, testing, etc.) : Changed to office per Dr. Bing Matter     Medications Adjustments/Labs and Tests Ordered: No orders of the defined types were placed in this encounter.  No orders of the defined types were placed in this encounter.    Signed, Neena Rhymes, RN  04/10/2022 12:07 PM

## 2022-04-10 NOTE — Telephone Encounter (Signed)
Pt states her cardiologist recommended pcp refer her to [place] as her thyroid could be causing some of her heart problems. Please advise.
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
## 2022-04-11 ENCOUNTER — Other Ambulatory Visit: Payer: Self-pay | Admitting: Cardiology

## 2022-04-11 DIAGNOSIS — I4891 Unspecified atrial fibrillation: Secondary | ICD-10-CM | POA: Diagnosis not present

## 2022-04-12 LAB — THYROID PANEL WITH TSH
Free Thyroxine Index: 2.7 (ref 1.2–4.9)
T3 Uptake Ratio: 28 % (ref 24–39)
T4, Total: 9.6 ug/dL (ref 4.5–12.0)
TSH: 0.502 u[IU]/mL (ref 0.450–4.500)

## 2022-04-17 ENCOUNTER — Telehealth: Payer: Self-pay

## 2022-04-17 NOTE — Telephone Encounter (Signed)
Patient notified of results. Results sent to PCP °

## 2022-04-17 NOTE — Telephone Encounter (Signed)
-----   Message from Park Liter, MD sent at 04/17/2022 11:02 AM EDT ----- Thyroid profile seems to be normal limits send copy to primary care physician
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
## 2022-04-22 ENCOUNTER — Encounter: Payer: Self-pay | Admitting: Family

## 2022-04-22 ENCOUNTER — Ambulatory Visit (INDEPENDENT_AMBULATORY_CARE_PROVIDER_SITE_OTHER): Payer: 59 | Admitting: Family

## 2022-04-22 VITALS — BP 105/65 | HR 97 | Temp 98.0°F | Resp 16 | Wt 227.0 lb

## 2022-04-22 DIAGNOSIS — K754 Autoimmune hepatitis: Secondary | ICD-10-CM

## 2022-04-22 DIAGNOSIS — Z8249 Family history of ischemic heart disease and other diseases of the circulatory system: Secondary | ICD-10-CM

## 2022-04-22 DIAGNOSIS — I10 Essential (primary) hypertension: Secondary | ICD-10-CM

## 2022-04-22 DIAGNOSIS — E039 Hypothyroidism, unspecified: Secondary | ICD-10-CM | POA: Diagnosis not present

## 2022-04-22 DIAGNOSIS — I4891 Unspecified atrial fibrillation: Secondary | ICD-10-CM

## 2022-04-22 DIAGNOSIS — Z1159 Encounter for screening for other viral diseases: Secondary | ICD-10-CM | POA: Diagnosis not present

## 2022-04-22 DIAGNOSIS — Z114 Encounter for screening for human immunodeficiency virus [HIV]: Secondary | ICD-10-CM | POA: Diagnosis not present

## 2022-04-22 HISTORY — DX: Family history of ischemic heart disease and other diseases of the circulatory system: Z82.49

## 2022-04-22 MED ORDER — LEVOTHYROXINE SODIUM 137 MCG PO TABS: 137.0000 ug | ORAL_TABLET | Freq: Every day | ORAL | 0 refills | Status: DC

## 2022-04-22 NOTE — Assessment & Plan Note (Signed)
Stable- management per Hepatology. They are considering giving her a trial off of imuran if she tolerates decrease in dose.

## 2022-04-22 NOTE — Assessment & Plan Note (Signed)
BP Readings from Last 3 Encounters:  04/22/22 105/65  04/10/22 100/70  09/13/21 101/73   BP stable following weight loss without medication. Monitor.

## 2022-04-22 NOTE — Assessment & Plan Note (Signed)
She reveals to me today that her mother had PE and she has a sister with hx of DVT.  I don't see that we have done a hypercoaguable panel on her before. I would like to check that.

## 2022-04-22 NOTE — Assessment & Plan Note (Signed)
TSH low normal. Given new symptoms of AF, will decrease her synthroid from to 137 mcg once daily to see if this helps her symptoms.

## 2022-04-22 NOTE — Assessment & Plan Note (Signed)
New diagnosis- wearing 14 day zio patch per cardiology. She is not felt to be a great candidate for anticoagulation due to her hx of cirrhosis of the liver.

## 2022-04-23 LAB — HEPATITIS C ANTIBODY: Hepatitis C Ab: NONREACTIVE

## 2022-04-23 LAB — HIV ANTIBODY (ROUTINE TESTING W REFLEX): HIV 1&2 Ab, 4th Generation: NONREACTIVE

## 2022-04-23 MED ORDER — SYNTHROID 137 MCG PO TABS: 137.0000 ug | ORAL_TABLET | Freq: Every day | ORAL | 0 refills | Status: DC

## 2022-04-23 NOTE — Addendum Note (Signed)
Addended by: Thelma Barge D on: 04/23/2022 02:58 PM   Modules accepted: Orders

## 2022-05-01 ENCOUNTER — Ambulatory Visit (HOSPITAL_BASED_OUTPATIENT_CLINIC_OR_DEPARTMENT_OTHER)
Admission: RE | Admit: 2022-05-01 | Discharge: 2022-05-01 | Disposition: A | Discharge status: Home / Self Care | Payer: 59 | Source: Ambulatory Visit | Attending: Cardiology | Admitting: Cardiology

## 2022-05-01 DIAGNOSIS — I4891 Unspecified atrial fibrillation: Secondary | ICD-10-CM | POA: Diagnosis not present

## 2022-05-01 DIAGNOSIS — K754 Autoimmune hepatitis: Secondary | ICD-10-CM | POA: Diagnosis not present

## 2022-05-01 DIAGNOSIS — K7469 Other cirrhosis of liver: Secondary | ICD-10-CM | POA: Diagnosis not present

## 2022-05-01 DIAGNOSIS — R002 Palpitations: Secondary | ICD-10-CM | POA: Diagnosis not present

## 2022-05-01 LAB — ECHOCARDIOGRAM COMPLETE
Area-P 1/2: 3.17 cm2
MV M vel: 4.13 m/s
MV Peak grad: 68.2 mmHg
S' Lateral: 4 cm

## 2022-05-02 ENCOUNTER — Other Ambulatory Visit: Payer: Self-pay

## 2022-05-04 ENCOUNTER — Telehealth: Payer: Self-pay | Admitting: Internal Medicine

## 2022-05-04 NOTE — Telephone Encounter (Signed)
I spoke with Ms. Megan [name] she is experiencing a flare up with joint pain and swelling this started shortly after tapering off her azathioprine as planned based on stable hepatic function and close follow up with [name] who has been managing her autoimmune hepatitis.  She reports symptom severity she has not seen since initial joint inflammation after her COVID vaccine. She already called her GI clinic about this and restarting the azathioprine. Also started taking low dose prednisone took 5 mg tablets twice in the past 8 hours with 50% reduction in swelling although still very hard to use her wrists and to get up and walk. She is afib based on last evaluation but not experiencing any dyspnea or palpitations. I recommend increasing steroid dose for the short term until symptoms calm down, as resuming azathioprine may take some days to have efficacy. 20 mg taper down by 5 mg every 3 days for 12 total doses. She reports having enough medication at home for this without new Rx but will request one if needed. If she experiences symptoms worsen again at lower dose or right after stopping she needs to contact us again.

## 2022-05-05 ENCOUNTER — Telehealth: Payer: Self-pay | Admitting: Cardiology

## 2022-05-05 NOTE — Telephone Encounter (Signed)
Pt c/o medication issue:  1. Name of Medication: prednisone  2. How are you currently taking this medication (dosage and times per day)?   3. Are you having a reaction (difficulty breathing--STAT)? No  4. What is your medication issue? Patient stated they are having a flare up with their auto immune disease. Patient stated they are now on this medication and the pt is not able to move around much until her body can get back to normal. Patient wants to make Dr. Bing Matter aware of the medication she has started and that she is currently stationary as of right now. Please advise.

## 2022-05-05 NOTE — Telephone Encounter (Signed)
Pt states that she has not been mobile in the past few days. Pt wants Dr. Bing Matter to be aware as she has afib and is not anticoagulated. Please advise.

## 2022-05-06 ENCOUNTER — Telehealth: Payer: Self-pay | Admitting: Family

## 2022-05-06 ENCOUNTER — Ambulatory Visit (INDEPENDENT_AMBULATORY_CARE_PROVIDER_SITE_OTHER): Payer: 59 | Admitting: Family

## 2022-05-06 VITALS — BP 108/47 | HR 62 | Ht 67.0 in

## 2022-05-06 DIAGNOSIS — M7989 Other specified soft tissue disorders: Secondary | ICD-10-CM | POA: Diagnosis not present

## 2022-05-06 NOTE — Telephone Encounter (Signed)
FYI: This call has been transferred to Access Nurse. Once the result note has been entered staff can address the message at that time.  Patient called in with the following symptoms: Patient is concerned for blood clots. Appt has been scheduled for this afternoon but per Clinic Mgr, it's best to triage pt to be on the safe side  Red Word: Possible Blood Clot    Please advise at Mobile [phone] (mobile)  Message is routed to Provider Pool and [facility] Triage
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
## 2022-05-06 NOTE — Telephone Encounter (Signed)
Was seen by Ria Clock

## 2022-05-07 ENCOUNTER — Telehealth: Payer: Self-pay

## 2022-05-07 DIAGNOSIS — M7989 Other specified soft tissue disorders: Secondary | ICD-10-CM

## 2022-05-07 NOTE — Telephone Encounter (Signed)
Pt called [facility] for DVT imaging but they are scheduling out a ways. She wants to know if the imaging needs to be switched locations. Please advise

## 2022-05-08 ENCOUNTER — Encounter (HOSPITAL_BASED_OUTPATIENT_CLINIC_OR_DEPARTMENT_OTHER): Payer: Self-pay | Admitting: Family

## 2022-05-08 ENCOUNTER — Other Ambulatory Visit: Payer: Self-pay

## 2022-05-08 DIAGNOSIS — R141 Gas pain: Secondary | ICD-10-CM | POA: Insufficient documentation

## 2022-05-08 DIAGNOSIS — M47816 Spondylosis without myelopathy or radiculopathy, lumbar region: Secondary | ICD-10-CM

## 2022-05-08 DIAGNOSIS — M533 Sacrococcygeal disorders, not elsewhere classified: Secondary | ICD-10-CM | POA: Insufficient documentation

## 2022-05-08 DIAGNOSIS — Z1211 Encounter for screening for malignant neoplasm of colon: Secondary | ICD-10-CM

## 2022-05-08 DIAGNOSIS — M7989 Other specified soft tissue disorders: Secondary | ICD-10-CM

## 2022-05-08 DIAGNOSIS — R195 Other fecal abnormalities: Secondary | ICD-10-CM | POA: Insufficient documentation

## 2022-05-08 DIAGNOSIS — R1011 Right upper quadrant pain: Secondary | ICD-10-CM

## 2022-05-08 DIAGNOSIS — Z9889 Other specified postprocedural states: Secondary | ICD-10-CM

## 2022-05-08 HISTORY — DX: Other fecal abnormalities: R19.5

## 2022-05-08 HISTORY — DX: Right upper quadrant pain: R10.11

## 2022-05-08 HISTORY — DX: Spondylosis without myelopathy or radiculopathy, lumbar region: M47.816

## 2022-05-08 HISTORY — DX: Other specified postprocedural states: Z98.890

## 2022-05-08 HISTORY — DX: Encounter for screening for malignant neoplasm of colon: Z12.11

## 2022-05-08 HISTORY — DX: Sacrococcygeal disorders, not elsewhere classified: M53.3

## 2022-05-08 HISTORY — DX: Gas pain: R14.1

## 2022-05-08 NOTE — Addendum Note (Signed)
Addended by: Wilford Corner on: 05/08/2022 11:17 AM   Modules accepted: Orders

## 2022-05-08 NOTE — Addendum Note (Signed)
Addended by: [name] on: 05/08/2022 08:09 AM   Modules accepted: Orders

## 2022-05-08 NOTE — Addendum Note (Signed)
Addended by: [name] on: 05/08/2022 08:24 AM   Modules accepted: Orders

## 2022-05-08 NOTE — Telephone Encounter (Signed)
We attempted to get patient scheduled today at [facility] hospital for doppler.  She is not available today.  Next available appointment is not until Monday after today. Unfortunately she declined ED at time of her visit.

## 2022-05-08 NOTE — Telephone Encounter (Signed)
Order changed to [facility].

## 2022-05-08 NOTE — Addendum Note (Signed)
Addended by: Wilford Corner on: 05/08/2022 12:54 PM   Modules accepted: Orders

## 2022-05-09 ENCOUNTER — Other Ambulatory Visit: Payer: 59

## 2022-05-09 ENCOUNTER — Ambulatory Visit (HOSPITAL_BASED_OUTPATIENT_CLINIC_OR_DEPARTMENT_OTHER)
Admission: RE | Admit: 2022-05-09 | Discharge: 2022-05-09 | Disposition: A | Discharge status: Home / Self Care | Payer: 59 | Source: Ambulatory Visit | Attending: Family | Admitting: Family

## 2022-05-09 ENCOUNTER — Telehealth: Payer: Self-pay

## 2022-05-09 DIAGNOSIS — M79662 Pain in left lower leg: Secondary | ICD-10-CM | POA: Diagnosis not present

## 2022-05-09 DIAGNOSIS — M7989 Other specified soft tissue disorders: Secondary | ICD-10-CM | POA: Diagnosis not present

## 2022-05-09 DIAGNOSIS — M79661 Pain in right lower leg: Secondary | ICD-10-CM | POA: Diagnosis not present

## 2022-05-09 NOTE — Telephone Encounter (Signed)
LMOM - returned patient's call.

## 2022-05-09 NOTE — Telephone Encounter (Signed)
I called patient, patient does not want to come in until she has her ultrasound.

## 2022-05-09 NOTE — Telephone Encounter (Signed)
Patient contacted the office stating she is having excruciating pain and stiffness in her legs. Patient states she feels that she is walking on tacks. Patient states she is still having swelling in her left knee and it is very painful. Patient states she is following the steroid regimen. Patient states she is not getting mobility back in her legs like she was before. Patient states she is getting an ultrasound on both legs for blood clots due to her limited mobility and patient states she is in atrial fibrillation. Patient states she can hardly bend her right knee. Patient states both ankle joints are very stiff and there is a raised area on her left ankle. Patient states she feels that she is having a whole body adverse reaction. Patient inquires if there is anything that she can do to help mobility and decrease blood clots. Patient states she is on day three of 15 mg Prednisone. Patient call back number is [phone]. Please advise.

## 2022-05-09 NOTE — Telephone Encounter (Signed)
Left VM to call back x1. This sounds like a problem that needs in person evaluation if not improving after several days on the prednisone. Unfortunately we are closed this afternoon and can't work her in. If she can come early next week we can work her in. I do not know increasing the steroid dosing is safe without seeing her, as it can increase risks with her afib. If the knee pain and swelling is not improving we could aspirate or inject the joint with less systemic risk. I see is scheduled for ultrasound at 4:00 today, I will check to see on this result later.

## 2022-05-12 ENCOUNTER — Telehealth: Payer: Self-pay

## 2022-05-12 DIAGNOSIS — M47816 Spondylosis without myelopathy or radiculopathy, lumbar region: Secondary | ICD-10-CM | POA: Diagnosis not present

## 2022-05-12 DIAGNOSIS — M47812 Spondylosis without myelopathy or radiculopathy, cervical region: Secondary | ICD-10-CM | POA: Diagnosis not present

## 2022-05-12 DIAGNOSIS — I10 Essential (primary) hypertension: Secondary | ICD-10-CM

## 2022-05-12 DIAGNOSIS — M961 Postlaminectomy syndrome, not elsewhere classified: Secondary | ICD-10-CM | POA: Diagnosis not present

## 2022-05-12 DIAGNOSIS — G894 Chronic pain syndrome: Secondary | ICD-10-CM | POA: Diagnosis not present

## 2022-05-12 NOTE — Telephone Encounter (Signed)
Echo Results reviewed with pt as per Dr. Vanetta Shawl note.  Pt verbalized understanding and had no additional questions. She will discuss further at appt 05-13-22 Routed to PCP

## 2022-05-12 NOTE — Telephone Encounter (Signed)
Spoke with pt today- She has appt to see Dr. Bing Matter tomorrow. She will discuss her Echo and questions at appt tomorrow.

## 2022-05-13 ENCOUNTER — Telehealth: Payer: Self-pay | Admitting: Gastroenterology

## 2022-05-13 ENCOUNTER — Encounter: Payer: Self-pay | Admitting: Cardiology

## 2022-05-13 ENCOUNTER — Ambulatory Visit: Payer: 59 | Attending: Cardiology | Admitting: Cardiology

## 2022-05-13 VITALS — BP 100/62 | HR 113 | Ht 67.0 in | Wt 220.0 lb

## 2022-05-13 DIAGNOSIS — I7121 Aneurysm of the ascending aorta, without rupture: Secondary | ICD-10-CM

## 2022-05-13 DIAGNOSIS — I10 Essential (primary) hypertension: Secondary | ICD-10-CM | POA: Diagnosis not present

## 2022-05-13 DIAGNOSIS — K754 Autoimmune hepatitis: Secondary | ICD-10-CM | POA: Diagnosis not present

## 2022-05-13 DIAGNOSIS — I420 Dilated cardiomyopathy: Secondary | ICD-10-CM

## 2022-05-13 DIAGNOSIS — I4819 Other persistent atrial fibrillation: Secondary | ICD-10-CM

## 2022-05-13 DIAGNOSIS — K7460 Unspecified cirrhosis of liver: Secondary | ICD-10-CM

## 2022-05-13 DIAGNOSIS — K7469 Other cirrhosis of liver: Secondary | ICD-10-CM | POA: Diagnosis not present
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
## 2022-05-13 NOTE — Telephone Encounter (Signed)
Pt states Megan [name] cardiologist wants Megan [name] to have an EGD asap. Pt has atrial fib and she states Dr. Bing Matter needs Megan [name] to have the EGD so he can try and get Megan [name] out of a fib. See OV note:  I think the best option would be to perform a transesophageal echocardiogram and then attempt cardioversion without anticoagulation but again the same concern possibility of varicosis. She did have EGD done in 2021 which showed no varicosis. She will be scheduled to see our EP team for consideration of antiarrhythmic therapy choice which is horribly difficult because of Megan [name] liver problem, I will wait for EGD if EGD showed no evidence of varicosis will consider doing TEE with cardioversion.   Please advise regarding scheduling EGD.

## 2022-05-13 NOTE — Telephone Encounter (Signed)
Inbound call from patient, states she saw her cardiologist today and he is wishing to schedule an EGD urgently. Recall is placed in epic, however patient states she is having severe health issues such as low blood pressure and Afib. Advised patient I would have to get approval from the provider prior to scheduling directly because of these symptoms. Patient is aware and would like to speak with a nurse to further advise on when she can be scheduled for an EGD.

## 2022-05-13 NOTE — Patient Instructions (Signed)
Medication Instructions:  Your physician recommends that you continue on your current medications as directed. Please refer to the Current Medication list given to you today.  *If you need a refill on your cardiac medications before your next appointment, please call your pharmacy*   Lab Work: None Ordered If you have labs (blood work) drawn today and your tests are completely normal, you will receive your results only by: MyChart Message (if you have MyChart) OR A paper copy in the mail If you have any lab test that is abnormal or we need to change your treatment, we will call you to review the results.   Testing/Procedures: None Ordered   Follow-Up: At [facility], you and your health needs are our priority.  As part of our continuing mission to provide you with exceptional heart care, we have created designated Provider Care Teams.  These Care Teams include your primary Cardiologist (physician) and Advanced Practice Providers (APPs -  Physician Assistants and Nurse Practitioners) who all work together to provide you with the care you need, when you need it.  We recommend signing up for the patient portal called "MyChart".  Sign up information is provided on this After Visit Summary.  MyChart is used to connect with patients for Virtual Visits (Telemedicine).  Patients are able to view lab/test results, encounter notes, upcoming appointments, etc.  Non-urgent messages can be sent to your provider as well.   To learn more about what you can do with MyChart, go to [email or web address].    Your next appointment:   6 week(s)  The format for your next appointment:   In Person  Provider:   Gypsy Balsam, MD    Other Instructions Referral to Dr. Elberta Fortis

## 2022-05-14 LAB — BASIC METABOLIC PANEL
BUN/Creatinine Ratio: 22 (ref 9–23)
BUN: 15 mg/dL (ref 6–24)
CO2: 25 mmol/L (ref 20–29)
Calcium: 8.8 mg/dL (ref 8.7–10.2)
Chloride: 99 mmol/L (ref 96–106)
Creatinine, Ser: 0.67 mg/dL (ref 0.57–1.00)
Glucose: 179 mg/dL — ABNORMAL HIGH (ref 70–99)
Potassium: 4.8 mmol/L (ref 3.5–5.2)
Sodium: 140 mmol/L (ref 134–144)
eGFR: 101 mL/min/{1.73_m2} (ref >59)

## 2022-05-14 NOTE — Telephone Encounter (Signed)
Called and spoke with patient to schedule EGD in the [facility]. I offered patient an appt this week with another provider, pt prefers to see Dr. Barron Alvine. I offered pt the appt on 05/27/22 but there is a scheduling conflict (she has to take kids to school). Pt has been scheduled for EGD in the [facility] on 05/28/22 at 10 am. Patient understands that she needs to arrive by 9 am with a care partner. Pt knows to expect her instructions via MyChart. Pt verbalized understanding and had no concerns at the end of the call.  Ambulatory referral to GI in epic. EGD instructions sent to patient via MyChart.

## 2022-05-20 DIAGNOSIS — R69 Illness, unspecified: Secondary | ICD-10-CM | POA: Diagnosis not present

## 2022-05-23 ENCOUNTER — Ambulatory Visit (INDEPENDENT_AMBULATORY_CARE_PROVIDER_SITE_OTHER): Payer: 59 | Admitting: Family

## 2022-05-23 ENCOUNTER — Telehealth: Payer: Self-pay

## 2022-05-23 VITALS — BP 111/70 | HR 84 | Temp 98.1°F | Resp 16 | Ht 67.0 in | Wt 222.0 lb

## 2022-05-23 DIAGNOSIS — Z0184 Encounter for antibody response examination: Secondary | ICD-10-CM | POA: Diagnosis not present

## 2022-05-23 DIAGNOSIS — A159 Respiratory tuberculosis unspecified: Secondary | ICD-10-CM | POA: Diagnosis not present

## 2022-05-23 DIAGNOSIS — M25561 Pain in right knee: Secondary | ICD-10-CM | POA: Diagnosis not present

## 2022-05-23 DIAGNOSIS — Z111 Encounter for screening for respiratory tuberculosis: Secondary | ICD-10-CM | POA: Diagnosis not present

## 2022-05-23 NOTE — Telephone Encounter (Signed)
-----   Message from Georgeanna Lea, MD sent at 05/15/2022  8:43 AM EDT ----- Chem-7 is good except sugar being elevated

## 2022-05-23 NOTE — Telephone Encounter (Signed)
Patient notified through my chart. Results sent to PCP
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
## 2022-05-24 ENCOUNTER — Encounter: Payer: Self-pay | Admitting: Family

## 2022-05-24 ENCOUNTER — Telehealth: Payer: Self-pay | Admitting: Family

## 2022-05-24 LAB — MEASLES/MUMPS/RUBELLA IMMUNITY
Mumps IgG: 100 AU/mL
Rubella: 9.15 Index
Rubeola IgG: >300 AU/mL

## 2022-05-24 LAB — HEPATITIS B SURFACE ANTIBODY, QUANTITATIVE: Hep B S AB Quant (Post): 119 m[IU]/mL (ref >=10)

## 2022-05-24 NOTE — Telephone Encounter (Signed)
Is it possible to add on an A1C by chance to Friday's labs? Dx hyperglycemia.

## 2022-05-26 LAB — QUANTIFERON-TB GOLD PLUS
Mitogen-NIL: 6.07 IU/mL
NIL: 0.02 IU/mL
QuantiFERON-TB Gold Plus: NEGATIVE
TB1-NIL: 0 IU/mL
TB2-NIL: 0 IU/mL

## 2022-05-26 NOTE — Telephone Encounter (Signed)
Unfortunately not.  That will require a type of tube that wasn't needed for original order.

## 2022-05-28 ENCOUNTER — Ambulatory Visit (AMBULATORY_SURGERY_CENTER): Payer: 59 | Admitting: Gastroenterology

## 2022-05-28 ENCOUNTER — Encounter: Payer: Self-pay | Admitting: Gastroenterology

## 2022-05-28 VITALS — BP 108/86 | HR 79 | Temp 97.7°F | Resp 11 | Ht 65.5 in | Wt 220.0 lb

## 2022-05-28 DIAGNOSIS — I4891 Unspecified atrial fibrillation: Secondary | ICD-10-CM | POA: Diagnosis not present

## 2022-05-28 DIAGNOSIS — K754 Autoimmune hepatitis: Secondary | ICD-10-CM

## 2022-05-28 DIAGNOSIS — K2970 Gastritis, unspecified, without bleeding: Secondary | ICD-10-CM

## 2022-05-28 DIAGNOSIS — K2950 Unspecified chronic gastritis without bleeding: Secondary | ICD-10-CM | POA: Diagnosis not present

## 2022-05-28 DIAGNOSIS — K31A0 Gastric intestinal metaplasia, unspecified: Secondary | ICD-10-CM | POA: Diagnosis not present

## 2022-05-28 DIAGNOSIS — M797 Fibromyalgia: Secondary | ICD-10-CM | POA: Diagnosis not present

## 2022-05-28 DIAGNOSIS — K7460 Unspecified cirrhosis of liver: Secondary | ICD-10-CM

## 2022-05-28 MED ORDER — SODIUM CHLORIDE 0.9 % IV SOLN
500.0000 mL | INTRAVENOUS | Status: DC
Start: 2022-05-28 — End: 2022-05-28
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
## 2022-05-28 NOTE — Patient Instructions (Signed)
Please read handouts provided. Continue present medications. Await pathology results. Repeat upper endoscopy in 2-3 years. Follow-up with Dr. Bing Matter . Follow-up with [facility].   YOU HAD AN ENDOSCOPIC PROCEDURE TODAY AT THE [facility]:   Refer to the procedure report that was given to you for any specific questions about what was found during the examination.  If the procedure report does not answer your questions, please call your gastroenterologist to clarify.  If you requested that your care partner not be given the details of your procedure findings, then the procedure report has been included in a sealed envelope for you to review at your convenience later.  YOU SHOULD EXPECT: Some feelings of bloating in the abdomen. Passage of more gas than usual.  Walking can help get rid of the air that was put into your GI tract during the procedure and reduce the bloating. If you had a lower endoscopy (such as a colonoscopy or flexible sigmoidoscopy) you may notice spotting of blood in your stool or on the toilet paper. If you underwent a bowel prep for your procedure, you may not have a normal bowel movement for a few days.  Please Note:  You might notice some irritation and congestion in your nose or some drainage.  This is from the oxygen used during your procedure.  There is no need for concern and it should clear up in a day or so.  SYMPTOMS TO REPORT IMMEDIATELY:  Following upper endoscopy (EGD)  Vomiting of blood or coffee ground material  New chest pain or pain under the shoulder blades  Painful or persistently difficult swallowing  New shortness of breath  Fever of 100F or higher  Black, tarry-looking stools  For urgent or emergent issues, a gastroenterologist can be reached at any hour by calling [phone]. Do not use MyChart messaging for urgent concerns.    DIET:  We do recommend a small meal at first, but then you may proceed to your regular  diet.  Drink plenty of fluids but you should avoid alcoholic beverages for 24 hours.  ACTIVITY:  You should plan to take it easy for the rest of today and you should NOT DRIVE or use heavy machinery until tomorrow (because of the sedation medicines used during the test).    FOLLOW UP: Our staff will call the number listed on your records the next business day following your procedure.  We will call around 7:15- 8:00 am to check on you and address any questions or concerns that you may have regarding the information given to you following your procedure. If we do not reach you, we will leave a message.     If any biopsies were taken you will be contacted by phone or by letter within the next 1-3 weeks.  Please call us at [phone] if you have not heard about the biopsies in 3 weeks.    SIGNATURES/CONFIDENTIALITY: You and/or your care partner have signed paperwork which will be entered into your electronic medical record.  These signatures attest to the fact that that the information above on your After Visit Summary has been reviewed and is understood.  Full responsibility of the confidentiality of this discharge information lies with you and/or your care-partner.

## 2022-05-28 NOTE — Progress Notes (Signed)
Called to room to assist during endoscopic procedure.  Patient ID and intended procedure confirmed with present staff. Received instructions for my participation in the procedure from the performing physician.  

## 2022-05-28 NOTE — Op Note (Signed)
[facility] Patient Name: Megan [name] Procedure Date: 05/28/2022 10:17 AM MRN: [id number] Endoscopist: Doristine Locks , MD, [id number] Age: 57 Referring MD:  Date of Birth: 11-Nov-1965 Gender: Female Account #: [id number] Procedure:                Upper GI endoscopy Indications:              Cirrhosis rule out esophageal varices                           57 y.o. female who presents for EGD for EV                            screening and pre-operative assessment prior to TEE                            w/ possible cardioversion planned.                           She follows with Megan [name] at [facility]                            for her AIH with cirrhosis, last seen 05/15/2022. Medicines:                Monitored Anesthesia Care Procedure:                Pre-Anesthesia Assessment:                           - Prior to the procedure, a History and Physical                            was performed, and patient medications and                            allergies were reviewed. The patient's tolerance of                            previous anesthesia was also reviewed. The risks                            and benefits of the procedure and the sedation                            options and risks were discussed with the patient.                            All questions were answered, and informed consent                            was obtained. Prior Anticoagulants: The patient has                            taken no anticoagulant or antiplatelet agents. ASA  Grade Assessment: III - A patient with severe                            systemic disease. After reviewing the risks and                            benefits, the patient was deemed in satisfactory                            condition to undergo the procedure.                           After obtaining informed consent, the endoscope was                            passed under direct vision.  Throughout the                            procedure, the patient's blood pressure, pulse, and                            oxygen saturations were monitored continuously. The                            Olympus Scope [id number] was introduced through the                            mouth, and advanced to the second part of duodenum.                            The upper GI endoscopy was accomplished without                            difficulty. The patient tolerated the procedure                            well. Scope In: Scope Out: Findings:                 A single small bleb was found in the middle third                            of the esophagus, 28 cm from the incisors.                           The examined esophagus was otherwise normal.                            Specifically, no esophageal varices noted on this                            study.                           The Z-line was regular and was found  41 cm from the                            incisors.                           Evidence of a sleeve gastrectomy was found in the                            gastric fundus and in the gastric body. This was                            characterized by healthy appearing mucosa.                           Localized mucosal variance characterized by                            granularity was found in the prepyloric region of                            the stomach. Biopsies were taken with a cold                            forceps for histology. Additional biopsies were                            taken throughout the stomach to evaluate for                            intestinal metaplasia per GIM mapping protocol.                            Estimated blood loss was minimal.                           The examined duodenum was normal. Complications:            No immediate complications. Estimated Blood Loss:     Estimated blood loss was minimal. Impression:               - Bleb found in the  esophagus.                           - Normal esophagus.                           - Z-line regular, 41 cm from the incisors.                           - A sleeve gastrectomy was found, characterized by                            healthy appearing mucosa.                           -  Gastric mucosal variant. Biopsied.                           - Normal examined duodenum. Recommendation:           - Patient has a contact number available for                            emergencies. The signs and symptoms of potential                            delayed complications were discussed with the                            patient. Return to normal activities tomorrow.                            Written discharge instructions were provided to the                            patient.                           - Resume previous diet.                           - Continue present medications.                           - Await pathology results.                           - Repeat upper endoscopy in 2-3 years for ongoing                            variceal screening.                           - Follow-up with Dr. Bing Matter in the Cardiology                            Clinic.                           - Follow-up in the [facility] as                            scheduled. Doristine Locks, MD 05/28/2022 10:49:02 AM

## 2022-05-28 NOTE — Progress Notes (Signed)
Uneventful anesthetic. Report to pacu rn. Vss. Care resumed by rn. 

## 2022-05-29 ENCOUNTER — Telehealth: Payer: Self-pay | Admitting: *Deleted

## 2022-05-29 ENCOUNTER — Telehealth: Payer: Self-pay | Admitting: Cardiology

## 2022-05-29 LAB — HYPERCOAGULABLE PANEL, COMPREHENSIVE
APTT: 27.1 s
AT III Act/Nor PPP Chro: 105 %
Act. Prt C Resist w/FV Defic.: 2.9 ratio
Anticardiolipin Ab, IgG: <10 [GPL'U]
Anticardiolipin Ab, IgM: <10 [MPL'U]
Beta-2 Glycoprotein I, IgA: <10 SAU
Beta-2 Glycoprotein I, IgG: <10 SGU
Beta-2 Glycoprotein I, IgM: <10 SMU
DRVVT Screen Seconds: 35.8 s
Factor VII Antigen**: 125 %
Factor VIII Activity: 117 %
Hexagonal Phospholipid Neutral: 9 s
Homocysteine: 9.5 umol/L
Prot C Ag Act/Nor PPP Imm: 70 %
Prot S Ag Act/Nor PPP Imm: 67 %
Protein C Ag/FVII Ag Ratio**: 0.6 ratio
Protein S Ag/FVII Ag Ratio**: 0.5 ratio

## 2022-05-29 NOTE — Telephone Encounter (Signed)
Pt stated she needs a simple note from MD stating she's under his care and he's seeing her for. She needs this letter for school and stated it can be emailed to her at [email or web address], mailed to her address or she can come by the office and pick it up. Pt stated please call if you have any questions or concerns. Please advise

## 2022-05-29 NOTE — Telephone Encounter (Signed)
  Follow up Call-     05/28/2022   10:05 AM 10/25/2019   10:05 AM 09/29/2019   10:49 AM  Call back number  Post procedure Call Back phone  # [phone]  Permission to leave phone message Yes Yes Yes     Patient questions:  Do you have a fever, pain , or abdominal swelling? No. Pain Score  0 *  Have you tolerated food without any problems? Yes.    Have you been able to return to your normal activities? Yes.    Do you have any questions about your discharge instructions: Diet   No. Medications  No. Follow up visit  No.  Do you have questions or concerns about your Care? No.  Actions: * If pain score is 4 or above: No action needed, pain <4.

## 2022-06-03 ENCOUNTER — Other Ambulatory Visit (INDEPENDENT_AMBULATORY_CARE_PROVIDER_SITE_OTHER): Payer: 59

## 2022-06-03 DIAGNOSIS — E039 Hypothyroidism, unspecified: Secondary | ICD-10-CM

## 2022-06-03 LAB — TSH: TSH: 1.31 u[IU]/mL (ref 0.35–5.50)

## 2022-06-10 ENCOUNTER — Other Ambulatory Visit: Payer: 59

## 2022-06-13 ENCOUNTER — Ambulatory Visit: Payer: 59 | Attending: Cardiology | Admitting: Cardiology

## 2022-06-13 ENCOUNTER — Encounter: Payer: Self-pay | Admitting: Cardiology

## 2022-06-13 VITALS — BP 140/82 | HR 86 | Ht 65.5 in | Wt 223.0 lb

## 2022-06-13 DIAGNOSIS — D6869 Other thrombophilia: Secondary | ICD-10-CM | POA: Diagnosis not present

## 2022-06-13 DIAGNOSIS — I4819 Other persistent atrial fibrillation: Secondary | ICD-10-CM | POA: Diagnosis not present

## 2022-06-13 DIAGNOSIS — I5022 Chronic systolic (congestive) heart failure: Secondary | ICD-10-CM
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
## 2022-06-16 NOTE — Telephone Encounter (Signed)
Spoke with pt about letter needed. Letter sent to pts email.

## 2022-06-16 NOTE — Telephone Encounter (Signed)
Left message for pt to call regarding letter message from 2 weeks ago.

## 2022-06-18 DIAGNOSIS — M47816 Spondylosis without myelopathy or radiculopathy, lumbar region: Secondary | ICD-10-CM | POA: Diagnosis not present

## 2022-06-18 DIAGNOSIS — M961 Postlaminectomy syndrome, not elsewhere classified: Secondary | ICD-10-CM | POA: Diagnosis not present

## 2022-06-18 DIAGNOSIS — G894 Chronic pain syndrome: Secondary | ICD-10-CM | POA: Diagnosis not present

## 2022-06-18 DIAGNOSIS — M47812 Spondylosis without myelopathy or radiculopathy, cervical region: Secondary | ICD-10-CM | POA: Diagnosis not present

## 2022-07-08 ENCOUNTER — Other Ambulatory Visit: Payer: 59

## 2022-07-08 ENCOUNTER — Telehealth: Payer: Self-pay | Admitting: *Deleted

## 2022-07-08 NOTE — Telephone Encounter (Signed)
[name], MD  Baird Lyons, RN See GI recommendations as below.  Would be able to start Eliquis and plan for either dofetilide or ablation based on patient's desires.       Previous Messages    ----- Message ----- From: Shellia Cleverly, DO Sent: 06/18/2022   2:07 PM EDT To: [name], MD  Will,  Based on her recent EGD, I think it should be fine for TEE, temperature probe, and starting anticoagulation.  There was a single small bleb in the middle third of the esophagus, located 28 cm from the incisors.  This is a benign, incidental finding, and not a varix.  It is small and should not interfere with scope passage at all.  Last colonoscopy was 2021 and without any stigmata of bleeding that would preclude starting anticoagulation either.  As for ablation vs dofetilide, I would certainly defer to your expertise and no objection to either from a GI standpoint.  Thanks for taking care of her!  Vito  ----- Message ----- From: [name], MD Sent: 06/13/2022   4:27 PM EDT To: Shellia Cleverly, DO  I saw Megan [name] today.  She is unfortunately in atrial fibrillation.  She feels mildly short of breath and fatigue.  Additionally, her ejection fraction has gone down to 40 to 45%.  If we can keep her in sinus rhythm, that would be beneficial.  She will need to be anticoagulated for this.  It does appear that she had an endoscopy without evidence of varices.  Do think that starting her on Eliquis would be a reasonable option.  If so, would potentially plan for ablation versus dofetilide.  Do either of these sound better than the other?  Hopefully if we can get her in sinus rhythm, then we can stop her anticoagulation.  Additionally, if we are going to perform ablation, she would need an esophageal temperature probe.  Do think that it would be reasonable to place this with her recent endoscopy and no varices?  Thanks for your input.  Will Camnitz

## 2022-07-08 NOTE — Telephone Encounter (Signed)
Left message to call back  (Needs 3 mo f/u appt as well)

## 2022-07-08 NOTE — Telephone Encounter (Signed)
Informed pt that GI did respond/advise. Pt asking if Camnitz has discussed w/ her heptalogist.  Informed I am not sure but will check with Camnitz. She does not want to start Tikosyn, if recommended.  She is very against this medication after investigating. We help 11/7 ablation date for now. Asking if proceed with procedure will she have to stay overnight d/t her medical hx. Aware forwarding to MD and will let her know. Pt agreeable to plan.

## 2022-07-10 NOTE — Telephone Encounter (Signed)
Routed [employer] OV note to [name], NP w/ hepatology via epic

## 2022-07-14 ENCOUNTER — Encounter: Payer: Self-pay | Admitting: Cardiology

## 2022-07-14 ENCOUNTER — Ambulatory Visit: Payer: 59 | Attending: Cardiology | Admitting: Cardiology

## 2022-07-14 VITALS — BP 110/76 | HR 100 | Ht 66.0 in | Wt 217.0 lb

## 2022-07-14 DIAGNOSIS — I7121 Aneurysm of the ascending aorta, without rupture: Secondary | ICD-10-CM

## 2022-07-14 DIAGNOSIS — I4819 Other persistent atrial fibrillation: Secondary | ICD-10-CM | POA: Diagnosis not present

## 2022-07-14 DIAGNOSIS — I10 Essential (primary) hypertension: Secondary | ICD-10-CM | POA: Diagnosis not present

## 2022-07-14 DIAGNOSIS — I420 Dilated cardiomyopathy: Secondary | ICD-10-CM
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
## 2022-07-14 NOTE — Patient Instructions (Signed)
Medication Instructions:  Your physician recommends that you continue on your current medications as directed. Please refer to the Current Medication list given to you today.  *If you need a refill on your cardiac medications before your next appointment, please call your pharmacy*   Lab Work: None Ordered If you have labs (blood work) drawn today and your tests are completely normal, you will receive your results only by: MyChart Message (if you have MyChart) OR A paper copy in the mail If you have any lab test that is abnormal or we need to change your treatment, we will call you to review the results.   Testing/Procedures: None Ordered   Follow-Up: At [facility], you and your health needs are our priority.  As part of our continuing mission to provide you with exceptional heart care, we have created designated Provider Care Teams.  These Care Teams include your primary Cardiologist (physician) and Advanced Practice Providers (APPs -  Physician Assistants and Nurse Practitioners) who all work together to provide you with the care you need, when you need it.  We recommend signing up for the patient portal called "MyChart".  Sign up information is provided on this After Visit Summary.  MyChart is used to connect with patients for Virtual Visits (Telemedicine).  Patients are able to view lab/test results, encounter notes, upcoming appointments, etc.  Non-urgent messages can be sent to your provider as well.   To learn more about what you can do with MyChart, go to [email or web address].    Your next appointment:   2 month(s)  The format for your next appointment:   In Person  Provider:   Robert Krasowski, MD    Other Instructions NA  

## 2022-07-15 ENCOUNTER — Other Ambulatory Visit: Payer: Self-pay | Admitting: Family

## 2022-07-16 DIAGNOSIS — M961 Postlaminectomy syndrome, not elsewhere classified: Secondary | ICD-10-CM | POA: Diagnosis not present

## 2022-07-16 DIAGNOSIS — M47816 Spondylosis without myelopathy or radiculopathy, lumbar region: Secondary | ICD-10-CM | POA: Diagnosis not present

## 2022-07-16 DIAGNOSIS — G894 Chronic pain syndrome: Secondary | ICD-10-CM | POA: Diagnosis not present

## 2022-07-16 DIAGNOSIS — M47812 Spondylosis without myelopathy or radiculopathy, cervical region: Secondary | ICD-10-CM | POA: Diagnosis not present

## 2022-07-28 ENCOUNTER — Encounter: Payer: Self-pay | Admitting: Cardiology

## 2022-07-31 DIAGNOSIS — H0011 Chalazion right upper eyelid: Secondary | ICD-10-CM | POA: Diagnosis not present

## 2022-08-18 DIAGNOSIS — G894 Chronic pain syndrome: Secondary | ICD-10-CM | POA: Diagnosis not present

## 2022-08-18 DIAGNOSIS — M47812 Spondylosis without myelopathy or radiculopathy, cervical region: Secondary | ICD-10-CM | POA: Diagnosis not present

## 2022-08-18 DIAGNOSIS — M47816 Spondylosis without myelopathy or radiculopathy, lumbar region: Secondary | ICD-10-CM | POA: Diagnosis not present

## 2022-08-18 DIAGNOSIS — M961 Postlaminectomy syndrome, not elsewhere classified: Secondary | ICD-10-CM | POA: Diagnosis not present

## 2022-08-18 NOTE — Telephone Encounter (Signed)
Pt is calling back about this issue. Please advise. She states it is in regards to her last appt and discussion about ablation

## 2022-09-09 ENCOUNTER — Other Ambulatory Visit: Payer: 59

## 2022-09-10 ENCOUNTER — Other Ambulatory Visit: Payer: 59

## 2022-09-11 NOTE — Telephone Encounter (Addendum)
I have faxed a request to [name], NP twice but have not heard back. I have not had the chance to call her office to inquire further, and told the patient as such beginning of this month.  I also told her I would look into this when [place] (been back two weeks this week). I will try calling the office tomorrow to see if I can get further on this.  Or if you are able to reach them that would be great (let me know if you do, but I will  try them tomorrow)   Are we still attempting ablation, as her note below seems to indicate she spoke with you and said: After speaking to Dr. Bing Matter during my last office visit a few weeks ago, I do not believe he feels that the Cardiac ablation procedure is the best option at this point in my situation.  Waiting for my medical team to provide guidance on the next steps.   Thanks [employer]

## 2022-09-12 NOTE — Telephone Encounter (Signed)
Called pt to let he know we still have not heard from Drazek's office.   Aware I was unable to try and call them today, but would attempt next week.  She sees Dr. Bing Matter 9/10 and will further discuss things with him as well. She appreciates the call and update, and that we have not forgotten her.

## 2022-09-18 DIAGNOSIS — M961 Postlaminectomy syndrome, not elsewhere classified: Secondary | ICD-10-CM | POA: Diagnosis not present

## 2022-09-18 DIAGNOSIS — M47812 Spondylosis without myelopathy or radiculopathy, cervical region: Secondary | ICD-10-CM | POA: Diagnosis not present

## 2022-09-18 DIAGNOSIS — Z79891 Long term (current) use of opiate analgesic: Secondary | ICD-10-CM | POA: Diagnosis not present

## 2022-09-18 DIAGNOSIS — G894 Chronic pain syndrome: Secondary | ICD-10-CM | POA: Diagnosis not present

## 2022-09-18 DIAGNOSIS — M47816 Spondylosis without myelopathy or radiculopathy, lumbar region: Secondary | ICD-10-CM | POA: Diagnosis not present

## 2022-09-19 ENCOUNTER — Other Ambulatory Visit: Payer: 59

## 2022-09-19 ENCOUNTER — Other Ambulatory Visit: Payer: Self-pay | Admitting: Medical Genetics

## 2022-09-19 DIAGNOSIS — Z006 Encounter for examination for normal comparison and control in clinical research program: Secondary | ICD-10-CM

## 2022-09-19 NOTE — Telephone Encounter (Signed)
Late Entry: Spoke w/ Drazek's office Wednesday. Dr. Gershon Crane number given to call him directly to discuss. Re-faxed OV notes to their office as well.

## 2022-09-23 ENCOUNTER — Encounter: Payer: Self-pay | Admitting: Cardiology

## 2022-09-23 ENCOUNTER — Ambulatory Visit: Payer: 59 | Attending: Cardiology | Admitting: Cardiology

## 2022-09-23 VITALS — BP 112/82 | HR 67 | Ht 67.0 in | Wt 214.0 lb

## 2022-09-23 DIAGNOSIS — I10 Essential (primary) hypertension: Secondary | ICD-10-CM

## 2022-09-23 DIAGNOSIS — I420 Dilated cardiomyopathy: Secondary | ICD-10-CM

## 2022-09-23 DIAGNOSIS — R0609 Other forms of dyspnea: Secondary | ICD-10-CM | POA: Diagnosis not present

## 2022-09-23 DIAGNOSIS — I4819 Other persistent atrial fibrillation: Secondary | ICD-10-CM

## 2022-09-23 NOTE — Addendum Note (Signed)
Addended by: Baldo Ash D on: 09/23/2022 03:10 PM   Modules accepted: Orders
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
## 2022-09-23 NOTE — Patient Instructions (Signed)
Medication Instructions:  Your physician recommends that you continue on your current medications as directed. Please refer to the Current Medication list given to you today.  *If you need a refill on your cardiac medications before your next appointment, please call your pharmacy*   Lab Work: 3rd Floor- [address] BMP, CBC, ProBNP- today If you have labs (blood work) drawn today and your tests are completely normal, you will receive your results only by: MyChart Message (if you have MyChart) OR A paper copy in the mail If you have any lab test that is abnormal or we need to change your treatment, we will call you to review the results.   Testing/Procedures: None Ordered   Follow-Up: At [facility], you and your health needs are our priority.  As part of our continuing mission to provide you with exceptional heart care, we have created designated Provider Care Teams.  These Care Teams include your primary Cardiologist (physician) and Advanced Practice Providers (APPs -  Physician Assistants and Nurse Practitioners) who all work together to provide you with the care you need, when you need it.  We recommend signing up for the patient portal called "MyChart".  Sign up information is provided on this After Visit Summary.  MyChart is used to connect with patients for Virtual Visits (Telemedicine).  Patients are able to view lab/test results, encounter notes, upcoming appointments, etc.  Non-urgent messages can be sent to your provider as well.   To learn more about what you can do with MyChart, go to [email or web address].    Your next appointment:   6 week(s)  The format for your next appointment:   In Person  Provider:   Gypsy Balsam, MD    Other Instructions NA    I got a message from Dr. Elberta Fortis that he spoke to her hepatologist and it is okay to initiate anticoagulation, please check her CBC stop aspirin start Eliquis 5 mg twice daily she needs to have CBC done week  later I will need to see her back in the office within 4 weeks

## 2022-09-24 LAB — BASIC METABOLIC PANEL
BUN/Creatinine Ratio: 30 — ABNORMAL HIGH (ref 9–23)
BUN: 19 mg/dL (ref 6–24)
CO2: 30 mmol/L — ABNORMAL HIGH (ref 20–29)
Calcium: 9.1 mg/dL (ref 8.7–10.2)
Chloride: 100 mmol/L (ref 96–106)
Creatinine, Ser: 0.64 mg/dL (ref 0.57–1.00)
Glucose: 73 mg/dL (ref 70–99)
Potassium: 5.1 mmol/L (ref 3.5–5.2)
Sodium: 140 mmol/L (ref 134–144)
eGFR: 102 mL/min/{1.73_m2} (ref >59)

## 2022-09-24 LAB — CBC
Hematocrit: 42 % (ref 34.0–46.6)
Hemoglobin: 14.3 g/dL (ref 11.1–15.9)
MCH: 30.1 pg (ref 26.6–33.0)
MCHC: 34 g/dL (ref 31.5–35.7)
MCV: 88 fL (ref 79–97)
Platelets: 159 10*3/uL (ref 150–450)
RBC: 4.75 x10E6/uL (ref 3.77–5.28)
RDW: 13 % (ref 11.7–15.4)
WBC: 4.9 10*3/uL (ref 3.4–10.8)

## 2022-09-24 LAB — PRO B NATRIURETIC PEPTIDE: NT-Pro BNP: 968 pg/mL — ABNORMAL HIGH (ref 0–287)

## 2022-09-24 MED ORDER — APIXABAN 5 MG PO TABS: 5.0000 mg | ORAL_TABLET | Freq: Two times a day (BID) | ORAL | 11 refills | Status: DC

## 2022-09-24 NOTE — Addendum Note (Signed)
Addended by: Baldo Ash D on: 09/24/2022 10:28 AM   Modules accepted: Orders

## 2022-09-24 NOTE — Telephone Encounter (Signed)
Left pt message that I would follow up in next week/two.

## 2022-09-26 DIAGNOSIS — Z79891 Long term (current) use of opiate analgesic: Secondary | ICD-10-CM | POA: Diagnosis not present

## 2022-09-26 DIAGNOSIS — G894 Chronic pain syndrome: Secondary | ICD-10-CM | POA: Diagnosis not present

## 2022-09-29 ENCOUNTER — Other Ambulatory Visit: Payer: Self-pay | Admitting: Physical Medicine and Rehabilitation

## 2022-09-29 ENCOUNTER — Ambulatory Visit
Admission: RE | Admit: 2022-09-29 | Discharge: 2022-09-29 | Disposition: A | Discharge status: Home / Self Care | Payer: 59 | Source: Ambulatory Visit | Attending: Nurse Practitioner

## 2022-09-29 ENCOUNTER — Ambulatory Visit
Admission: RE | Admit: 2022-09-29 | Discharge: 2022-09-29 | Disposition: A | Discharge status: Home / Self Care | Payer: 59 | Source: Ambulatory Visit | Attending: Physical Medicine and Rehabilitation | Admitting: Physical Medicine and Rehabilitation

## 2022-09-29 DIAGNOSIS — K7469 Other cirrhosis of liver: Secondary | ICD-10-CM

## 2022-09-29 DIAGNOSIS — M545 Low back pain, unspecified: Secondary | ICD-10-CM

## 2022-09-29 DIAGNOSIS — K754 Autoimmune hepatitis: Secondary | ICD-10-CM

## 2022-09-29 DIAGNOSIS — M25552 Pain in left hip: Secondary | ICD-10-CM | POA: Diagnosis not present

## 2022-09-29 DIAGNOSIS — M25551 Pain in right hip: Secondary | ICD-10-CM

## 2022-09-29 DIAGNOSIS — K7460 Unspecified cirrhosis of liver: Secondary | ICD-10-CM | POA: Diagnosis not present

## 2022-09-29 DIAGNOSIS — K760 Fatty (change of) liver, not elsewhere classified: Secondary | ICD-10-CM

## 2022-09-30 ENCOUNTER — Telehealth: Payer: Self-pay

## 2022-09-30 NOTE — Telephone Encounter (Signed)
Spoke with pt about lab results per Dr. Vanetta Shawl note. Eliquis started and she will follow up with CBC. Routed to PCP.

## 2022-10-01 ENCOUNTER — Encounter: Payer: Self-pay | Admitting: Cardiology

## 2022-10-09 ENCOUNTER — Encounter: Payer: Self-pay | Admitting: Internal Medicine

## 2022-10-09 ENCOUNTER — Ambulatory Visit: Payer: 59 | Attending: Internal Medicine | Admitting: Internal Medicine

## 2022-10-09 VITALS — BP 119/88 | HR 93 | Resp 16 | Ht 67.0 in | Wt 214.0 lb

## 2022-10-09 DIAGNOSIS — M1990 Unspecified osteoarthritis, unspecified site: Secondary | ICD-10-CM

## 2022-10-09 DIAGNOSIS — K754 Autoimmune hepatitis: Secondary | ICD-10-CM

## 2022-10-09 DIAGNOSIS — E063 Autoimmune thyroiditis: Secondary | ICD-10-CM

## 2022-10-09 LAB — SEDIMENTATION RATE: Sed Rate: 6 mm/h (ref 0–30)
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
## 2022-10-13 ENCOUNTER — Other Ambulatory Visit: Payer: Self-pay | Admitting: Family

## 2022-10-14 DIAGNOSIS — R252 Cramp and spasm: Secondary | ICD-10-CM | POA: Diagnosis not present

## 2022-10-14 DIAGNOSIS — R791 Abnormal coagulation profile: Secondary | ICD-10-CM | POA: Diagnosis not present

## 2022-10-14 DIAGNOSIS — R772 Abnormality of alphafetoprotein: Secondary | ICD-10-CM | POA: Diagnosis not present

## 2022-10-14 DIAGNOSIS — K7469 Other cirrhosis of liver: Secondary | ICD-10-CM | POA: Diagnosis not present

## 2022-10-14 DIAGNOSIS — K754 Autoimmune hepatitis: Secondary | ICD-10-CM | POA: Diagnosis not present

## 2022-10-14 DIAGNOSIS — R7989 Other specified abnormal findings of blood chemistry: Secondary | ICD-10-CM | POA: Diagnosis not present

## 2022-10-14 DIAGNOSIS — R768 Other specified abnormal immunological findings in serum: Secondary | ICD-10-CM | POA: Diagnosis not present

## 2022-10-14 DIAGNOSIS — L538 Other specified erythematous conditions: Secondary | ICD-10-CM | POA: Diagnosis not present

## 2022-10-14 DIAGNOSIS — R799 Abnormal finding of blood chemistry, unspecified: Secondary | ICD-10-CM | POA: Diagnosis not present

## 2022-10-16 LAB — C-REACTIVE PROTEIN: CRP: <3 mg/L (ref <8.0)

## 2022-10-16 LAB — MUTATED CITRULLINATED VIMENTIN (MCV) ANTIBODY: MUTATED CITRULLINATED VIMENTIN (MCV) AB: <20 U/mL (ref <20)

## 2022-10-16 LAB — RHEUMATOID FACTOR: Rheumatoid fact SerPl-aCnc: 11 [IU]/mL (ref <14)

## 2022-10-20 DIAGNOSIS — M961 Postlaminectomy syndrome, not elsewhere classified: Secondary | ICD-10-CM | POA: Diagnosis not present

## 2022-10-20 DIAGNOSIS — G894 Chronic pain syndrome: Secondary | ICD-10-CM | POA: Diagnosis not present

## 2022-10-20 DIAGNOSIS — M47816 Spondylosis without myelopathy or radiculopathy, lumbar region: Secondary | ICD-10-CM | POA: Diagnosis not present

## 2022-10-20 DIAGNOSIS — M47812 Spondylosis without myelopathy or radiculopathy, cervical region: Secondary | ICD-10-CM | POA: Diagnosis not present

## 2022-10-21 ENCOUNTER — Telehealth: Payer: Self-pay | Admitting: Cardiology

## 2022-10-21 NOTE — Telephone Encounter (Signed)
Spoke with pt to schedule follow up with Dr. Elberta Fortis. She stated she wanted me to let Dr. Bing Matter know that she is feeling much better since starting blood thinners. She stated she is not having SOB anymore and she is not having as many nose bleeds. She is scheduled to see Dr. Bing Matter on 11/19/22 in [place] and Dr. Elberta Fortis on 11/24/22 in [place].

## 2022-11-19 ENCOUNTER — Encounter: Payer: Self-pay | Admitting: Cardiology

## 2022-11-19 ENCOUNTER — Ambulatory Visit: Payer: 59 | Attending: Cardiology | Admitting: Cardiology

## 2022-11-19 VITALS — BP 118/80 | HR 88 | Ht 67.0 in | Wt 216.0 lb

## 2022-11-19 DIAGNOSIS — I4819 Other persistent atrial fibrillation: Secondary | ICD-10-CM | POA: Diagnosis not present

## 2022-11-19 DIAGNOSIS — M961 Postlaminectomy syndrome, not elsewhere classified: Secondary | ICD-10-CM | POA: Diagnosis not present

## 2022-11-19 DIAGNOSIS — E063 Autoimmune thyroiditis: Secondary | ICD-10-CM

## 2022-11-19 DIAGNOSIS — I872 Venous insufficiency (chronic) (peripheral): Secondary | ICD-10-CM | POA: Diagnosis not present

## 2022-11-19 DIAGNOSIS — M47812 Spondylosis without myelopathy or radiculopathy, cervical region: Secondary | ICD-10-CM | POA: Diagnosis not present

## 2022-11-19 DIAGNOSIS — K754 Autoimmune hepatitis: Secondary | ICD-10-CM

## 2022-11-19 DIAGNOSIS — G894 Chronic pain syndrome: Secondary | ICD-10-CM | POA: Diagnosis not present

## 2022-11-19 DIAGNOSIS — I420 Dilated cardiomyopathy: Secondary | ICD-10-CM

## 2022-11-19 DIAGNOSIS — M47816 Spondylosis without myelopathy or radiculopathy, lumbar region: Secondary | ICD-10-CM | POA: Diagnosis not present
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
## 2022-11-19 NOTE — Addendum Note (Signed)
Addended by: Baldo Ash D on: 11/19/2022 11:11 AM   Modules accepted: Orders
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
## 2022-11-19 NOTE — Patient Instructions (Signed)
Medication Instructions:  Your physician recommends that you continue on your current medications as directed. Please refer to the Current Medication list given to you today.  *If you need a refill on your cardiac medications before your next appointment, please call your pharmacy*   Lab Work: None Ordered If you have labs (blood work) drawn today and your tests are completely normal, you will receive your results only by: MyChart Message (if you have MyChart) OR A paper copy in the mail If you have any lab test that is abnormal or we need to change your treatment, we will call you to review the results.   Testing/Procedures:     Dear Megan [name]  You are scheduled for a TEE (Transesophageal Echocardiogram) Guided Cardioversion on Monday, November 18 with Dr. Bjorn Pippin.  Please arrive at the [facility] (Main Entrance A) at [facility]: [address] [place], [place] [address] at 8:30 AM (This time is 1.5 hour(s) before your procedure to ensure your preparation). Free valet parking service is available. You will check in at ADMITTING. The support person will be asked to wait in the waiting room.  It is OK to have someone drop you off and come back when you are ready to be discharged.      DIET:  Nothing to eat or drink after midnight except a sip of water with medications (see medication instructions below)  Continue taking your anticoagulant (blood thinner): Apixaban (Eliquis).  You will need to continue this after your procedure until you are told by your provider that it is safe to stop.    LABS:  BMP, CBC- today 3rd floor [address]  FYI:  For your safety, and to allow [place] to monitor your vital signs accurately during the surgery/procedure we request: If you have artificial nails, gel coating, SNS etc, please have those removed prior to your surgery/procedure. Not having the nail coverings /polish removed may result in cancellation or delay of your  surgery/procedure.  You must have a responsible person to drive you home and stay in the waiting area during your procedure. Failure to do so could result in cancellation.  Bring your insurance cards.  *Special Note: Every effort is made to have your procedure done on time. Occasionally there are emergencies that occur at the hospital that may cause delays. Please be patient if a delay does occur.       Follow-Up: At [facility], you and your health needs are our priority.  As part of our continuing mission to provide you with exceptional heart care, we have created designated Provider Care Teams.  These Care Teams include your primary Cardiologist (physician) and Advanced Practice Providers (APPs -  Physician Assistants and Nurse Practitioners) who all work together to provide you with the care you need, when you need it.  We recommend signing up for the patient portal called "MyChart".  Sign up information is provided on this After Visit Summary.  MyChart is used to connect with patients for Virtual Visits (Telemedicine).  Patients are able to view lab/test results, encounter notes, upcoming appointments, etc.  Non-urgent messages can be sent to your provider as well.   To learn more about what you can do with MyChart, go to [email or web address].    Your next appointment:   2 month(s)  The format for your next appointment:   In Person  Provider:   Gypsy Balsam, MD    Other Instructions NA

## 2022-11-20 LAB — BASIC METABOLIC PANEL
BUN/Creatinine Ratio: 29 — ABNORMAL HIGH (ref 9–23)
BUN: 17 mg/dL (ref 6–24)
CO2: 27 mmol/L (ref 20–29)
Calcium: 9.1 mg/dL (ref 8.7–10.2)
Chloride: 101 mmol/L (ref 96–106)
Creatinine, Ser: 0.58 mg/dL (ref 0.57–1.00)
Glucose: 74 mg/dL (ref 70–99)
Potassium: 4.8 mmol/L (ref 3.5–5.2)
Sodium: 141 mmol/L (ref 134–144)
eGFR: 104 mL/min/{1.73_m2} (ref >59)

## 2022-11-20 LAB — CBC
Hematocrit: 39.3 % (ref 34.0–46.6)
Hemoglobin: 12.9 g/dL (ref 11.1–15.9)
MCH: 29.3 pg (ref 26.6–33.0)
MCHC: 32.8 g/dL (ref 31.5–35.7)
MCV: 89 fL (ref 79–97)
Platelets: 133 10*3/uL — ABNORMAL LOW (ref 150–450)
RBC: 4.41 x10E6/uL (ref 3.77–5.28)
RDW: 12.7 % (ref 11.7–15.4)
WBC: 3.7 10*3/uL (ref 3.4–10.8)

## 2022-11-21 ENCOUNTER — Telehealth: Payer: Self-pay

## 2022-11-21 NOTE — Telephone Encounter (Signed)
Left message on My Chart with normal results per Dr. Krasowski's note. Routed to PCP. 

## 2022-11-24 ENCOUNTER — Ambulatory Visit: Payer: 59 | Admitting: Cardiology

## 2022-11-25 ENCOUNTER — Telehealth: Payer: Self-pay

## 2022-11-25 NOTE — Telephone Encounter (Signed)
Patient viewed results on My Chart per Dr. Vanetta Shawl note. Routed to PCP.

## 2022-11-28 NOTE — OR Nursing (Signed)
Called patient with pre-procedure instructions for tomorrow.   Patient informed of:   Time to arrive for procedure. 0745 Remain NPO past midnight.  Must have a ride home and a responsible adult to remain with them for 24 ours post procedure.  Confirmed blood thinner. Confirmed no breaks in taking blood thinner for 3+ weeks prior to procedure. Confirmed patient stopped all GLP-1s and GLP-2s for at least one week before procedure.   Spoke with patient regarding above information. She cannot arrive until 8:30.

## 2022-12-01 ENCOUNTER — Ambulatory Visit (HOSPITAL_BASED_OUTPATIENT_CLINIC_OR_DEPARTMENT_OTHER)
Admission: RE | Admit: 2022-12-01 | Discharge: 2022-12-01 | Disposition: A | Discharge status: Home / Self Care | Payer: 59 | Source: Ambulatory Visit | Attending: Cardiology | Admitting: Cardiology

## 2022-12-01 ENCOUNTER — Ambulatory Visit (HOSPITAL_COMMUNITY)
Admission: RE | Admit: 2022-12-01 | Discharge: 2022-12-01 | Disposition: A | Discharge status: Home / Self Care | Payer: 59 | Source: Ambulatory Visit | Attending: Cardiology | Admitting: Cardiology

## 2022-12-01 ENCOUNTER — Ambulatory Visit (HOSPITAL_COMMUNITY): Payer: 59 | Admitting: Certified Registered Nurse Anesthetist

## 2022-12-01 ENCOUNTER — Other Ambulatory Visit: Payer: Self-pay

## 2022-12-01 ENCOUNTER — Encounter (HOSPITAL_COMMUNITY)
Admission: RE | Disposition: A | Discharge status: Home / Self Care | Payer: Self-pay | Source: Ambulatory Visit | Attending: Cardiology

## 2022-12-01 DIAGNOSIS — Z87891 Personal history of nicotine dependence: Secondary | ICD-10-CM | POA: Diagnosis not present

## 2022-12-01 DIAGNOSIS — I480 Paroxysmal atrial fibrillation: Secondary | ICD-10-CM | POA: Diagnosis not present

## 2022-12-01 DIAGNOSIS — Z006 Encounter for examination for normal comparison and control in clinical research program: Secondary | ICD-10-CM

## 2022-12-01 DIAGNOSIS — Z9884 Bariatric surgery status: Secondary | ICD-10-CM | POA: Insufficient documentation

## 2022-12-01 DIAGNOSIS — I10 Essential (primary) hypertension: Secondary | ICD-10-CM | POA: Insufficient documentation

## 2022-12-01 DIAGNOSIS — Z7901 Long term (current) use of anticoagulants: Secondary | ICD-10-CM | POA: Diagnosis not present

## 2022-12-01 DIAGNOSIS — I4819 Other persistent atrial fibrillation: Secondary | ICD-10-CM | POA: Diagnosis not present

## 2022-12-01 DIAGNOSIS — E063 Autoimmune thyroiditis: Secondary | ICD-10-CM | POA: Diagnosis not present

## 2022-12-01 DIAGNOSIS — I4891 Unspecified atrial fibrillation: Secondary | ICD-10-CM | POA: Diagnosis not present

## 2022-12-01 DIAGNOSIS — I110 Hypertensive heart disease with heart failure: Secondary | ICD-10-CM | POA: Diagnosis not present

## 2022-12-01 DIAGNOSIS — I509 Heart failure, unspecified: Secondary | ICD-10-CM | POA: Diagnosis not present

## 2022-12-01 DIAGNOSIS — I77819 Aortic ectasia, unspecified site: Secondary | ICD-10-CM | POA: Insufficient documentation

## 2022-12-01 DIAGNOSIS — E039 Hypothyroidism, unspecified: Secondary | ICD-10-CM | POA: Diagnosis not present

## 2022-12-01 HISTORY — PX: TRANSESOPHAGEAL ECHOCARDIOGRAM (CATH LAB): EP1270

## 2022-12-01 HISTORY — PX: CARDIOVERSION: EP1203

## 2022-12-01 LAB — ECHO TEE

## 2022-12-01 SURGERY — TRANSESOPHAGEAL ECHOCARDIOGRAM (TEE) (CATHLAB)
Anesthesia: General

## 2022-12-01 MED ORDER — PROPOFOL 10 MG/ML IV BOLUS
INTRAVENOUS | Status: DC | PRN
  Administered 2022-12-01: 50 mg via INTRAVENOUS
  Administered 2022-12-01: 30 mg via INTRAVENOUS
  Administered 2022-12-01 (×2): 50 mg via INTRAVENOUS

## 2022-12-01 MED ORDER — PROPOFOL 500 MG/50ML IV EMUL
INTRAVENOUS | Status: DC | PRN
  Administered 2022-12-01: 100 ug/kg/min via INTRAVENOUS

## 2022-12-01 MED ORDER — SODIUM CHLORIDE 0.9 % IV SOLN: INTRAVENOUS | Status: DC

## 2022-12-01 SURGICAL SUPPLY — 1 items: PAD DEFIB RADIO PHYSIO CONN (PAD) ×1 IMPLANT

## 2022-12-01 NOTE — Interval H&P Note (Signed)
History and Physical Interval Note:  12/01/2022 9:27 AM  Megan [name]  has presented today for surgery, with the diagnosis of afib.  The various methods of treatment have been discussed with the patient and family. After consideration of risks, benefits and other options for treatment, the patient has consented to  Procedure(s): TRANSESOPHAGEAL ECHOCARDIOGRAM (N/A) CARDIOVERSION (CATH LAB) (N/A) as a surgical intervention.  The patient's history has been reviewed, patient examined, no change in status, stable for surgery.  I have reviewed the patient's chart and labs.  Questions were answered to the patient's satisfaction.     [name]

## 2022-12-01 NOTE — Anesthesia Postprocedure Evaluation (Addendum)
Anesthesia Post Note  Patient: Megan [name]  Procedure(s) Performed: TRANSESOPHAGEAL ECHOCARDIOGRAM CARDIOVERSION (CATH LAB)     Patient location during evaluation: Other Anesthesia Type: MAC Level of consciousness: awake and alert Pain management: pain level controlled Vital Signs Assessment: post-procedure vital signs reviewed and stable Respiratory status: spontaneous breathing, nonlabored ventilation, respiratory function stable and patient connected to nasal cannula oxygen Cardiovascular status: stable and blood pressure returned to baseline Postop Assessment: no apparent nausea or vomiting Anesthetic complications: no  No notable events documented.  Last Vitals:  Vitals:   12/01/22 1100 12/01/22 1110  BP: 127/72 125/85  Pulse: (!) 46 (!) 50  Resp: 11 11  Temp:    SpO2: 100% 100%    Last Pain:  Vitals:   12/01/22 1038  TempSrc: Temporal  PainSc: 0-No pain                 Bannie Lobban S

## 2022-12-01 NOTE — Discharge Instructions (Signed)
Electrical Cardioversion Electrical cardioversion is the delivery of a jolt of electricity to restore a normal rhythm to the heart. A rhythm that is too fast or is not regular keeps the heart from pumping well. In this procedure, sticky patches or metal paddles are placed on the chest to deliver electricity to the heart from a device. This procedure may be done in an emergency if: There is low or no blood pressure as a result of the heart rhythm. Normal rhythm must be restored as fast as possible to protect the brain and heart from further damage. It may save a life. This may also be a scheduled procedure for irregular or fast heart rhythms that are not immediately life-threatening.  What can I expect after the procedure? Your blood pressure, heart rate, breathing rate, and blood oxygen level will be monitored until you leave the hospital or clinic. Your heart rhythm will be watched to make sure it does not change. You may have some redness on the skin where the shocks were given. Over the counter cortizone cream may be helpful.  Follow these instructions at home: Do not drive for 24 hours if you were given a sedative during your procedure. Take over-the-counter and prescription medicines only as told by your health care provider. Ask your health care provider how to check your pulse. Check it often. Rest for 48 hours after the procedure or as told by your health care provider. Avoid or limit your caffeine use as told by your health care provider. Keep all follow-up visits as told by your health care provider. This is important. Contact a health care provider if: You feel like your heart is beating too quickly or your pulse is not regular. You have a serious muscle cramp that does not go away. Get help right away if: You have discomfort in your chest. You are dizzy or you feel faint. You have trouble breathing or you are short of breath. Your speech is slurred. You have trouble moving an  arm or leg on one side of your body. Your fingers or toes turn cold or blue. Summary Electrical cardioversion is the delivery of a jolt of electricity to restore a normal rhythm to the heart. This procedure may be done right away in an emergency or may be a scheduled procedure if the condition is not an emergency. Generally, this is a safe procedure. After the procedure, check your pulse often as told by your health care provider. This information is not intended to replace advice given to you by your health care provider. Make sure you discuss any questions you have with your health care provider. Document Revised: 08/02/2018 Document Reviewed: 08/02/2018 Elsevier Patient Education  2020 [employer] TEE   YOU HAD AN CARDIAC PROCEDURE TODAY: Refer to the procedure report and other information in the discharge instructions given to you for any specific questions about what was found during the examination. If this information does not answer your questions, please call Dr. Verl Dicker office at [phone] to clarify.   DIET: Your first meal following the procedure should be a light meal and then it is ok to progress to your normal diet. A half-sandwich or bowl of soup is an example of a good first meal. Heavy or fried foods are harder to digest and may make you feel nauseous or bloated. Drink plenty of fluids but you should avoid alcoholic beverages for 24 hours. If you had a esophageal dilation, please see attached instructions for diet.   ACTIVITY:  Your care partner should take you home directly after the procedure. You should plan to take it easy, moving slowly for the rest of the day. You can resume normal activity the day after the procedure however YOU SHOULD NOT DRIVE, use power tools, machinery or perform tasks that involve climbing or major physical exertion for 24 hours (because of the sedation medicines used during the test).   SYMPTOMS TO REPORT IMMEDIATELY: A cardiologist can be  reached at any hour. Please call [phone] for any of the following symptoms:  Vomiting of blood or coffee ground material  New, significant abdominal pain  New, significant chest pain or pain under the shoulder blades  Painful or persistently difficult swallowing  New shortness of breath  Black, tarry-looking or red, bloody stools  FOLLOW UP:  Please also call with any specific questions about appointments or follow up tests.

## 2022-12-01 NOTE — CV Procedure (Signed)
   TRANSESOPHAGEAL ECHOCARDIOGRAM GUIDED DIRECT CURRENT CARDIOVERSION  NAME:  Megan [name]   MRN: [id number] DOB:  May 01, 1965   ADMIT DATE: 12/01/2022  INDICATIONS: Symptomatic atrial fibrillation  PROCEDURE:   Informed consent was obtained prior to the procedure. The risks, benefits and alternatives for the procedure were discussed and the patient comprehended these risks.  Risks include, but are not limited to, cough, sore throat, vomiting, nausea, somnolence, esophageal and stomach trauma or perforation, bleeding, low blood pressure, aspiration, pneumonia, infection, trauma to the teeth and death.    After a procedural time-out, the oropharynx was anesthetized and the patient was sedated by the anesthesia service. The transesophageal probe was inserted in the esophagus and stomach without difficulty and multiple views were obtained. Anesthesia was monitored by Janeann Forehand, CRNA.   COMPLICATIONS:    Complications: No complications Patient tolerated procedure well.  FINDINGS:  No LAA thrombus   CARDIOVERSION:     Indications:  Symptomatic Atrial Fibrillation  Procedure Details:  Once the TEE was complete, the patient had the defibrillator pads placed in the anterior and posterior position. Once an appropriate level of sedation was confirmed, the patient was cardioverted x 1 with 200J of biphasic synchronized energy.  The patient converted to NSR.  There were no apparent complications.  The patient had normal neuro status and respiratory status post procedure with vitals stable as recorded elsewhere.  Adequate airway was maintained throughout and vital signs monitored per protocol.  Epifanio Lesches MD [facility]  [address], [address] [place], [place] [address] [phone]   3:34 PM

## 2022-12-01 NOTE — Research (Signed)
[facility] Cardioversion Informed Consent   Subject Name: Megan [name] [facility]  Subject met inclusion and exclusion criteria.  The informed consent form, study requirements and expectations were reviewed with the subject and questions and concerns were addressed prior to the signing of the consent form.  The subject verbalized understanding of the trial requirements.  The subject agreed to participate in the [facility] Cardioversion trial and signed the informed consent on 18/Nov/2024.  The informed consent was obtained prior to performance of any protocol-specific procedures for the subject.  A copy of the signed informed consent was given to the subject and a copy was placed in the subject's medical record.   Megan [name]

## 2022-12-01 NOTE — Transfer of Care (Signed)
Immediate Anesthesia Transfer of Care Note  Patient: Megan [name]  Procedure(s) Performed: TRANSESOPHAGEAL ECHOCARDIOGRAM CARDIOVERSION (CATH LAB)  Patient Location: Cath Lab  Anesthesia Type:MAC  Level of Consciousness: drowsy and patient cooperative  Airway & Oxygen Therapy: Patient Spontanous Breathing and Patient connected to nasal cannula oxygen  Post-op Assessment: Report given to RN, Post -op Vital signs reviewed and stable, and Patient moving all extremities X 4  Post vital signs: Reviewed and stable  Last Vitals:  Vitals Value Taken Time  BP 102/79   Temp    Pulse 68   Resp 14   SpO2 94     Last Pain:  Vitals:   12/01/22 0933  TempSrc:   PainSc: 0-No pain         Complications: No notable events documented.

## 2022-12-01 NOTE — Anesthesia Preprocedure Evaluation (Signed)
Anesthesia Evaluation  Patient identified by MRN, date of birth, ID band Patient awake    Reviewed: Allergy & Precautions, H&P , NPO status , Patient's Chart, lab work & pertinent test results  Airway Mallampati: II  TM Distance: >3 FB Neck ROM: Full    Dental no notable dental hx.    Pulmonary neg pulmonary ROS, former smoker   Pulmonary exam normal breath sounds clear to auscultation       Cardiovascular hypertension, + Peripheral Vascular Disease and +CHF  Normal cardiovascular exam+ dysrhythmias Atrial Fibrillation  Rhythm:Irregular Rate:Normal  Left ventricular ejection fraction, by estimation, is 40 to 45%. The  left ventricle has mildly decreased function. The left ventricle has no  regional wall motion abnormalities. Left ventricular diastolic parameters  are indeterminate.   2. Right ventricular systolic function is normal. The right ventricular  size is normal. There is normal pulmonary artery systolic pressure.   3. Left atrial size was severely dilated.   4. The mitral valve is normal in structure. Mild mitral valve  regurgitation. No evidence of mitral stenosis.   5. The aortic valve is normal in structure. Aortic valve regurgitation is  not visualized. No aortic stenosis is present.   6. Aneurysm of the ascending aorta, measuring 41 mm.   7. The inferior vena cava is normal in size with greater than 50%  respiratory variability, suggesting right atrial pressure of 3 mmHg.     Neuro/Psych negative neurological ROS  negative psych ROS   GI/Hepatic negative GI ROS, Neg liver ROS,,,  Endo/Other  Hypothyroidism    Renal/GU negative Renal ROS  negative genitourinary   Musculoskeletal negative musculoskeletal ROS (+)    Abdominal   Peds negative pediatric ROS (+)  Hematology negative hematology ROS (+)   Anesthesia Other Findings   Reproductive/Obstetrics negative OB ROS                              Anesthesia Physical Anesthesia Plan  ASA: 3  Anesthesia Plan: General   Post-op Pain Management:    Induction: Intravenous  PONV Risk Score and Plan: Treatment may vary due to age or medical condition  Airway Management Planned: Mask and Nasal Cannula  Additional Equipment:   Intra-op Plan:   Post-operative Plan:   Informed Consent: I have reviewed the patients History and Physical, chart, labs and discussed the procedure including the risks, benefits and alternatives for the proposed anesthesia with the patient or authorized representative who has indicated his/her understanding and acceptance.     Dental advisory given  Plan Discussed with: CRNA and Surgeon  Anesthesia Plan Comments:        Anesthesia Quick Evaluation

## 2022-12-02 ENCOUNTER — Encounter (HOSPITAL_COMMUNITY): Payer: Self-pay | Admitting: Cardiology

## 2022-12-02 ENCOUNTER — Encounter: Payer: 59 | Admitting: Family

## 2022-12-13 ENCOUNTER — Telehealth: Payer: 59 | Admitting: Family Medicine

## 2022-12-13 DIAGNOSIS — H01002 Unspecified blepharitis right lower eyelid: Secondary | ICD-10-CM | POA: Diagnosis not present

## 2022-12-13 MED ORDER — DOXYCYCLINE HYCLATE 100 MG PO TABS: 100.0000 mg | ORAL_TABLET | Freq: Two times a day (BID) | ORAL | 0 refills | Status: AC

## 2022-12-13 NOTE — Patient Instructions (Signed)
Megan [name], thank you for joining Megan Pandy, PA-C for today's virtual visit.  While this provider is not your primary care provider (PCP), if your PCP is located in our provider database this encounter information will be shared with them immediately following your visit.   A [facility] MyChart account gives you access to today's visit and all your visits, tests, and labs performed at [facility] " click here if you don't have a [facility] MyChart account or go to [email or web address]  Consent: (Patient) Megan [name] provided verbal consent for this virtual visit at the beginning of the encounter.  Current Medications:  Current Outpatient Medications:    doxycycline (VIBRA-TABS) 100 MG tablet, Take 1 tablet (100 mg total) by mouth 2 (two) times daily for 7 days., Disp: 14 tablet, Rfl: 0   apixaban (ELIQUIS) 5 MG TABS tablet, Take 1 tablet (5 mg total) by mouth 2 (two) times daily., Disp: 60 tablet, Rfl: 11   azaTHIOprine (IMURAN) 50 MG tablet, Take 50 mg by mouth daily., Disp: , Rfl:    Azelastine HCl 137 MCG/SPRAY SOLN, PLACE 1 SPRAY INTO BOTH NOSTRILS 2 (TWO) TIMES DAILY. USE IN EACH NOSTRIL AS DIRECTED (Patient taking differently: Place 1 spray into both nostrils in the morning and at bedtime.), Disp: 30 mL, Rfl: 5   baclofen (LIORESAL) 10 MG tablet, Take 10 mg by mouth at bedtime., Disp: , Rfl:    CALCIUM PO, Take 1,300 mg by mouth daily. 650 mg each Chewable, Disp: , Rfl:    Multiple Vitamins-Iron (MULTIPLE VITAMIN/IRON PO), Take 1 tablet by mouth daily. Chewable, Disp: , Rfl:    neomycin-polymyxin b-dexamethasone (MAXITROL) 3.5-10000-0.1 OINT, Place 1 Application into both eyes at bedtime., Disp: , Rfl:    oxyCODONE (OXY IR/ROXICODONE) 5 MG immediate release tablet, Take 5 mg by mouth every 6 (six) hours as needed for pain., Disp: , Rfl:    Propylene Glycol-Glycerin (SOOTHE) 0.6-0.6 % SOLN, Place 1 drop into both eyes daily as needed., Disp: , Rfl:    SYNTHROID  137 MCG tablet, TAKE 1 TABLET BY MOUTH DAILY BEFORE BREAKFAST., Disp: 90 tablet, Rfl: 0   Medications ordered in this encounter:  Meds ordered this encounter  Medications   doxycycline (VIBRA-TABS) 100 MG tablet    Sig: Take 1 tablet (100 mg total) by mouth 2 (two) times daily for 7 days.    Dispense:  14 tablet    Refill:  0     *If you need refills on other medications prior to your next appointment, please contact your pharmacy*  Follow-Up: Call back or seek an in-person evaluation if the symptoms worsen or if the condition fails to improve as anticipated.  [facility] [phone]  Other Instructions Blepharitis Blepharitis is swelling of the eyelids. It can cause the eyes to feel dry or gritty. Other symptoms may include: Reddish, scaly skin around the scalp and eyebrows. Eyelids that itch or burn. Fluid that leaks from the eye at night. This causes the eyelashes to stick together in the morning. Eyelashes that fall out. Redness of the eyes. Eyes that are sensitive to light. Follow these instructions at home: Watch for any changes in how your eyes look or feel. Tell your doctor about any changes. Follow these instructions to help with your condition. Keeping clean Wash your hands often with soap and water for at least 20 seconds. Clean your eyes. Wash the edges of your eyelids using eyelid wipes or a small amount of baby  shampoo that has been mixed with warm water (diluted). Do this 2 or more times a day. Wash your face and eyebrows at least once a day. Use a clean towel each time you dry your eyelids. Do not use the towel to clean or dry other areas of your body. Do not share your towel with anyone. General instructions Avoid wearing makeup until you get better. Do not share makeup with anyone. Avoid rubbing your eyes. Use a warm compress on your eyes for 5-10 minutes at a time. Do this 1 or 2 times a day, or as told by your doctor. You can use: A towel  with warm water on it. A heating pad that can be warmed in the microwave. The pad should be very warm but not hot enough to burn the skin. If you were given an antibiotic cream or eye drops, use the medicine as told by your doctor. Do not stop using the medicine even if you feel better. Keep all follow-up visits. Contact a doctor if: Your eyelids feel hot. You have blisters on your eyelids. You have a rash on your eyelids. The swelling does not go away in 2-4 days. The swelling gets worse. Get help right away if: You have pain that gets worse or spreads to other parts of your face. You have redness that gets worse or spreads to other parts of your face. You have changes in how you see (vision). You have pain when you look at lights or things that move. You have a fever. Summary Blepharitis is swelling of the eyelids. Watch for any changes in how your eyes look or feel. Tell your doctor about any changes. Follow home care instructions as told by your doctor. Wash your hands often with soap and water for at least 20 seconds. Avoid wearing makeup. Do not rub your eyes. Use a warm compress, creams, or eye drops as told by your doctor. Let your doctor know if you have changes in how you see, blisters or a rash on your eyelids, or other problems. This information is not intended to replace advice given to you by your health care provider. Make sure you discuss any questions you have with your health care provider. Document Revised: 02/01/2020 Document Reviewed: 02/01/2020 Elsevier Patient Education  2024 Else[employer] Inc.    If you have been instructed to have an in-person evaluation today at a local Urgent Care facility, please use the link below. It will take you to a list of all of our available [facility], including address, phone number and hours of operation. Please do not delay care.  [facility]  If you or a family member do not have a primary care provider,  use the link below to schedule a visit and establish care. When you choose a [facility] primary care physician or advanced practice provider, you gain a long-term partner in health. Find a Primary Care Provider  Learn more about [facility]'s in-office and virtual care options: [facility] Get Care Now

## 2022-12-13 NOTE — Progress Notes (Signed)
Virtual Visit Consent   [name], you are scheduled for a virtual visit with a [facility] provider today. Just as with appointments in the office, your consent must be obtained to participate. Your consent will be active for this visit and any virtual visit you may have with one of our providers in the next 365 days. If you have a MyChart account, a copy of this consent can be sent to you electronically.  As this is a virtual visit, video technology does not allow for your provider to perform a traditional examination. This may limit your provider's ability to fully assess your condition. If your provider identifies any concerns that need to be evaluated in person or the need to arrange testing (such as labs, EKG, etc.), we will make arrangements to do so. Although advances in technology are sophisticated, we cannot ensure that it will always work on either your end or our end. If the connection with a video visit is poor, the visit may have to be switched to a telephone visit. With either a video or telephone visit, we are not always able to ensure that we have a secure connection.  By engaging in this virtual visit, you consent to the provision of healthcare and authorize for your insurance to be billed (if applicable) for the services provided during this visit. Depending on your insurance coverage, you may receive a charge related to this service.  I need to obtain your verbal consent now. Are you willing to proceed with your visit today? Megan [name] has provided verbal consent on 12/13/2022 for a virtual visit (video or telephone). [name], [place]  Date: 12/13/2022 11:05 AM  Virtual Visit via Video Note   I, [name], connected with  Megan [name]  ([id number], Sep 01, 1965) on 12/13/22 at 11:00 AM EST by a video-enabled telemedicine application and verified that I am speaking with the correct person using two identifiers.  Location: Patient: Virtual Visit Location Patient:  Home Provider: Virtual Visit Location Provider: Home Office   I discussed the limitations of evaluation and management by telemedicine and the availability of in person appointments. The patient expressed understanding and agreed to proceed.    History of Present Illness: Megan [name] is a 57 y.o. who identifies as a female who was assigned female at birth, and is being seen today for c/o having an infection in her right eye that is becoming worse.  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  Pt states her eye doctor gives her oral antibiotics, but feels like she cannot wait until Monday to follow up with doctor. Pt states she currently has a neomycin ointment as well as an eye mask.   HPI: HPI  Problems:  Patient Active Problem List   Diagnosis Date Noted   Immunity status testing 05/23/2022   Acute pain of right knee 05/23/2022   Dilated cardiomyopathy (HCC) 05/13/2022   Ascending aortic aneurysm (HCC) 05/13/2022   Flatulence, eructation and gas pain 05/08/2022   Feces contents abnormal 05/08/2022   Colon cancer screening 05/08/2022   Right upper quadrant pain 05/08/2022   SI (sacroiliac) joint dysfunction 05/08/2022   Lumbar spondylosis 05/08/2022   History of operative procedure on lumbosacral spinal structure 05/08/2022   Family history of blood clots 04/22/2022   Paroxysmal atrial fibrillation (HCC) 04/10/2022   Arrhythmia 01/01/2022   Hordeolum externum of left upper eyelid 12/04/2021   Thrombocytopenia (HCC) 11/13/2020   Retina disorder  Joint pain    Hypertension    Hepatitis, autoimmune (HCC)    Hashimoto's disease    Chronic back pain    Blind right eye    Arthritis    Persistent atrial fibrillation (HCC)    Steatosis of liver 05/15/2020   Unspecified cirrhosis of liver (HCC) 05/15/2020   Fatigue 05/04/2020   Palpitations 05/04/2020   Bilateral wrist pain 10/14/2019   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   Polyp of corpus  uteri 01/26/2019   Lumbar radiculopathy 05/04/2018   Chronic venous insufficiency 11/10/2017   Tibialis posterior tendinitis 04/01/2017   Chronic pain of right knee 04/21/2016   Bilateral ankle joint pain 04/21/2016   Obesity 08/28/2014   Asthma with acute exacerbation 07/19/2014   Acute sinusitis 05/15/2013   Microscopic hematuria 12/30/2011   Low back pain 11/25/2011   Urinary incontinence 11/25/2011   Pernicious anemia 07/31/2011   Iron deficiency anemia due to dietary causes 01/08/2011   Anemia, macrocytic 01/08/2011   HEMOCCULT POSITIVE STOOL 10/15/2009   Hematochezia 10/15/2009   CHRONIC PAIN SYNDROME 10/10/2009   BARIATRIC SURGERY STATUS 09/11/2009   RETINOBLASTOMA 08/31/2009   Hypothyroidism 08/31/2009   Hyperglycemia 08/31/2009   Hyperlipidemia 08/31/2009   ANEMIA 08/31/2009   Essential hypertension 08/31/2009   GASTROPARESIS 08/31/2009   Autoimmune hepatitis (HCC) 08/31/2009   Inflammatory arthritis 08/31/2009   OTHER UNSPECIFIED BACK DISORDER 08/31/2009   Fibromyalgia 08/31/2009   ARRHYTHMIA, HX OF 08/31/2009   Knee injury 2006    Allergies:  Allergies  Allergen Reactions   Hydrocodone Anaphylaxis    Tolerates oxycodone   Penicillins Rash    Rash as a child, has not had since childhood, patient states,04/11/19 Throat swells up as well Did it involve swelling of the face/tongue/throat, SOB, or low BP? Yes Did it involve sudden or severe rash/hives, skin peeling, or any reaction on the inside of your mouth or nose? Yes Did you need to seek medical attention at a hospital or doctor's office? Yes When did it last happen? childhood reaction.   If all above answers are "NO", may proceed with cephalosporin use.    Gadolinium Derivatives     Reactions have happened twice, after leaving facility. Pt reports feeling hot in trunk but extremities were icy cold; shivering, vomiting the first time after receiving contrast.  Symptoms lasted 12-18 hours.     Bioflavonoid  Products     Other Reaction(s): Other (See Comments)  Elevated Blood Pressure  Particular brand that she no longer uses.   Chantix [Varenicline] Nausea Only   Cymbalta [Duloxetine Hcl]     headach   Glucosamine Forte [Nutritional Supplements] Other (See Comments)    Elevated Blood Pressure   Metoclopramide     Other Reaction(s): Unknown   Morphine Other (See Comments)    REACTION: irregular heart beat   Codeine Rash   Medications:  Current Outpatient Medications:    doxycycline (VIBRA-TABS) 100 MG tablet, Take 1 tablet (100 mg total) by mouth 2 (two) times daily for 7 days., Disp: 14 tablet, Rfl: 0   apixaban (ELIQUIS) 5 MG TABS tablet, Take 1 tablet (5 mg total) by mouth 2 (two) times daily., Disp: 60 tablet, Rfl: 11   azaTHIOprine (IMURAN) 50 MG tablet, Take 50 mg by mouth daily., Disp: , Rfl:    Azelastine HCl 137 MCG/SPRAY SOLN, PLACE 1 SPRAY INTO BOTH NOSTRILS 2 (TWO) TIMES DAILY. USE IN EACH NOSTRIL AS DIRECTED (Patient taking differently: Place 1 spray into both nostrils in the morning and at  bedtime.), Disp: 30 mL, Rfl: 5   baclofen (LIORESAL) 10 MG tablet, Take 10 mg by mouth at bedtime., Disp: , Rfl:    CALCIUM PO, Take 1,300 mg by mouth daily. 650 mg each Chewable, Disp: , Rfl:    Multiple Vitamins-Iron (MULTIPLE VITAMIN/IRON PO), Take 1 tablet by mouth daily. Chewable, Disp: , Rfl:    neomycin-polymyxin b-dexamethasone (MAXITROL) 3.5-10000-0.1 OINT, Place 1 Application into both eyes at bedtime., Disp: , Rfl:    oxyCODONE (OXY IR/ROXICODONE) 5 MG immediate release tablet, Take 5 mg by mouth every 6 (six) hours as needed for pain., Disp: , Rfl:    Propylene Glycol-Glycerin (SOOTHE) 0.6-0.6 % SOLN, Place 1 drop into both eyes daily as needed., Disp: , Rfl:    SYNTHROID 137 MCG tablet, TAKE 1 TABLET BY MOUTH DAILY BEFORE BREAKFAST., Disp: 90 tablet, Rfl: 0  Observations/Objective: Patient is well-developed, well-nourished in no acute distress.  Resting comfortably at home.   Head is normocephalic, atraumatic. Right lower eyelid edema noted No labored breathing.  Speech is clear and coherent with logical content.  Patient is alert and oriented at baseline.    Assessment and Plan: 1. Blepharitis of right lower eyelid, unspecified type - doxycycline (VIBRA-TABS) 100 MG tablet; Take 1 tablet (100 mg total) by mouth 2 (two) times daily for 7 days.  Dispense: 14 tablet; Refill: 0  -Pt to follow up with ophthalmologist on Monday or urgent care sooner for worsening symptoms.   Follow Up Instructions: I discussed the assessment and treatment plan with the patient. The patient was provided an opportunity to ask questions and all were answered. The patient agreed with the plan and demonstrated an understanding of the instructions.  A copy of instructions were sent to the patient via MyChart unless otherwise noted below.    The patient was advised to call back or seek an in-person evaluation if the symptoms worsen or if the condition fails to improve as anticipated.    [name], PA-C

## 2022-12-18 DIAGNOSIS — M47816 Spondylosis without myelopathy or radiculopathy, lumbar region: Secondary | ICD-10-CM | POA: Diagnosis not present

## 2022-12-18 DIAGNOSIS — G894 Chronic pain syndrome: Secondary | ICD-10-CM | POA: Diagnosis not present

## 2022-12-18 DIAGNOSIS — M47812 Spondylosis without myelopathy or radiculopathy, cervical region: Secondary | ICD-10-CM | POA: Diagnosis not present

## 2022-12-18 DIAGNOSIS — M961 Postlaminectomy syndrome, not elsewhere classified: Secondary | ICD-10-CM | POA: Diagnosis not present

## 2022-12-22 ENCOUNTER — Ambulatory Visit: Payer: 59 | Admitting: Cardiology

## 2022-12-23 ENCOUNTER — Encounter (HOSPITAL_BASED_OUTPATIENT_CLINIC_OR_DEPARTMENT_OTHER): Payer: Self-pay

## 2022-12-23 ENCOUNTER — Ambulatory Visit (HOSPITAL_BASED_OUTPATIENT_CLINIC_OR_DEPARTMENT_OTHER)
Admission: RE | Admit: 2022-12-23 | Discharge: 2022-12-23 | Disposition: A | Discharge status: Home / Self Care | Payer: 59 | Source: Ambulatory Visit | Attending: Family | Admitting: Family

## 2022-12-23 ENCOUNTER — Encounter: Payer: Self-pay | Admitting: Family

## 2022-12-23 ENCOUNTER — Ambulatory Visit (INDEPENDENT_AMBULATORY_CARE_PROVIDER_SITE_OTHER): Payer: 59 | Admitting: Family

## 2022-12-23 VITALS — BP 111/67 | HR 86 | Temp 98.4°F | Resp 16 | Ht 66.0 in | Wt 214.0 lb

## 2022-12-23 DIAGNOSIS — E66811 Obesity, class 1: Secondary | ICD-10-CM | POA: Diagnosis not present

## 2022-12-23 DIAGNOSIS — Z1231 Encounter for screening mammogram for malignant neoplasm of breast: Secondary | ICD-10-CM | POA: Diagnosis not present

## 2022-12-23 DIAGNOSIS — Z23 Encounter for immunization: Secondary | ICD-10-CM | POA: Diagnosis not present

## 2022-12-23 DIAGNOSIS — Z0000 Encounter for general adult medical examination without abnormal findings: Secondary | ICD-10-CM | POA: Insufficient documentation

## 2022-12-23 DIAGNOSIS — I480 Paroxysmal atrial fibrillation: Secondary | ICD-10-CM | POA: Diagnosis not present

## 2022-12-23 DIAGNOSIS — K754 Autoimmune hepatitis: Secondary | ICD-10-CM

## 2022-12-23 DIAGNOSIS — E785 Hyperlipidemia, unspecified: Secondary | ICD-10-CM

## 2022-12-23 DIAGNOSIS — E039 Hypothyroidism, unspecified: Secondary | ICD-10-CM

## 2022-12-23 DIAGNOSIS — Z6834 Body mass index (BMI) 34.0-34.9, adult: Secondary | ICD-10-CM | POA: Diagnosis not present

## 2022-12-23 DIAGNOSIS — I10 Essential (primary) hypertension: Secondary | ICD-10-CM

## 2022-12-23 LAB — LIPID PANEL
Cholesterol: 157 mg/dL (ref 0–200)
HDL: 70.1 mg/dL (ref >39.00)
LDL Cholesterol: 76 mg/dL (ref 0–99)
NonHDL: 86.9
Total CHOL/HDL Ratio: 2
Triglycerides: 54 mg/dL (ref 0.0–149.0)
VLDL: 10.8 mg/dL (ref 0.0–40.0)

## 2022-12-23 LAB — COMPREHENSIVE METABOLIC PANEL
ALT: 21 U/L (ref 0–35)
AST: 29 U/L (ref 0–37)
Albumin: 4.1 g/dL (ref 3.5–5.2)
Alkaline Phosphatase: 91 U/L (ref 39–117)
BUN: 18 mg/dL (ref 6–23)
CO2: 34 meq/L — ABNORMAL HIGH (ref 19–32)
Calcium: 8.9 mg/dL (ref 8.4–10.5)
Chloride: 101 meq/L (ref 96–112)
Creatinine, Ser: 0.54 mg/dL (ref 0.40–1.20)
GFR: 100.54 mL/min (ref >60.00)
Glucose, Bld: 80 mg/dL (ref 70–99)
Potassium: 4.4 meq/L (ref 3.5–5.1)
Sodium: 140 meq/L (ref 135–145)
Total Bilirubin: 0.6 mg/dL (ref 0.2–1.2)
Total Protein: 7 g/dL (ref 6.0–8.3)

## 2022-12-23 LAB — TSH: TSH: 2.09 u[IU]/mL (ref 0.35–5.50)

## 2022-12-23 NOTE — Assessment & Plan Note (Signed)
Wt Readings from Last 3 Encounters:  12/23/22 214 lb (97.1 kg)  12/01/22 212 lb (96.2 kg)  11/19/22 216 lb (98 kg)   Weight stable- monitor.

## 2022-12-23 NOTE — Patient Instructions (Signed)
VISIT SUMMARY:  During today's visit, we discussed your recent cardioversion procedure and the irregular heartbeats and shortness of breath you've been experiencing. We also reviewed your chronic joint pain, your prosthetic eye concerns, and your general health maintenance. We have made plans to address each of these issues and ensure your continued well-being.  YOUR PLAN:  -ATRIAL FIBRILLATION: Atrial fibrillation is an irregular and often rapid heart rate that can lead to poor blood flow. You should continue taking Eliquis as prescribed, and we will plan for an EKG at your next cardiology appointment.  -HYPOTHYROIDISM: Hypothyroidism is a condition where your thyroid gland doesn't produce enough thyroid hormone. We will check your thyroid function with a blood test today to ensure your current medication dosage is appropriate.  -JOINT PAIN: Chronic joint pain can be due to various factors, including weather changes. Continue with your current pain management strategies, including taking baclofen as it has been effective for you.  -EYE PROSTHESIS: Your prosthetic eye has been causing recurrent infections and fitting issues. We will research potential specialists for implant replacement and ensure they are compatible with your insurance.  -GENERAL HEALTH MAINTENANCE: We will administer the influenza vaccine today and schedule a mammogram. The Shingles vaccine will be deferred due to your recent severe reaction to the COVID vaccine. We will also order a cholesterol panel and continue with your current diet and exercise regimen. Your eye exam is scheduled for January, and we plan for a colonoscopy in 2031.  INSTRUCTIONS:  Please follow up in 6 months for a Pap smear. Continue taking your medications as prescribed and attend your scheduled appointments. If you experience any new or worsening symptoms, contact our office immediately.

## 2022-12-23 NOTE — Assessment & Plan Note (Signed)
Lab Results  Component Value Date   CHOL 140 04/19/2014   HDL 45.90 04/19/2014   LDLCALC 78 04/19/2014   TRIG 79.0 04/19/2014   CHOLHDL 3 04/19/2014
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
## 2022-12-23 NOTE — Assessment & Plan Note (Signed)
  General Health Maintenance -Administer influenza vaccine today. -pt wishes to defer Shingles vaccine due to patient's recent severe reaction to COVID vaccine. -flu shot today -Schedule mammogram today. -Defer Pap smear to 27-month follow-up appointment. -Order cholesterol panel. -Continue current diet and exercise regimen. -Plan for colonoscopy in 2031. -Schedule eye exam in January.

## 2022-12-23 NOTE — Assessment & Plan Note (Addendum)
Maintained on eliquis. Rate stable today.  Has follow up with Cardiology.

## 2022-12-23 NOTE — Assessment & Plan Note (Signed)
Lab Results  Component Value Date   TSH 1.31 06/03/2022  Continues synthroid, update tsh.

## 2022-12-23 NOTE — Assessment & Plan Note (Signed)
This is being managed by Hepatology, Roosevelt Locks NP

## 2022-12-23 NOTE — Assessment & Plan Note (Signed)
BP Readings from Last 3 Encounters:  12/23/22 111/67  12/01/22 125/85  11/19/22 118/80   BP stable, not on bp medication.

## 2022-12-26 ENCOUNTER — Telehealth: Payer: Self-pay | Admitting: Family

## 2022-12-26 NOTE — Telephone Encounter (Signed)
-----   Message from Rennis Chris sent at 12/25/2022  4:35 PM EST ----- Hi there,  I would start with an ocularist as that would be the least invasive intervention. I don't really work with any ocularists or know of any that I could even recommend. Sorry I'm not much help...  Karie Chimera, [name], Ph.D. Diseases & Surgery of the Retina and Vitreous [facility] ----- Message ----- From: [name], NP Sent: 12/25/2022   9:32 AM EST To: Rennis Chris, MD  Dear Dr. Vanessa Barbara,  I have a quick ophthalmology question please.  This patient has a prosthetic eye implant that was originally done as a child.  She is interested in having this replaced.  Is this a surgical procedure or is this something that could be addressed by an an Ocularist?  If so, do you have someone you would recommend.  Thank you!  [name] NP

## 2023-01-12 ENCOUNTER — Other Ambulatory Visit: Payer: Self-pay | Admitting: Family

## 2023-01-18 ENCOUNTER — Telehealth: Payer: 59 | Admitting: Physician Assistant

## 2023-01-18 DIAGNOSIS — J019 Acute sinusitis, unspecified: Secondary | ICD-10-CM | POA: Diagnosis not present

## 2023-01-18 MED ORDER — DOXYCYCLINE HYCLATE 100 MG PO TABS: 100.0000 mg | ORAL_TABLET | Freq: Two times a day (BID) | ORAL | 0 refills | Status: DC

## 2023-01-18 MED ORDER — DOXYCYCLINE HYCLATE 100 MG PO TABS: 100.0000 mg | ORAL_TABLET | Freq: Two times a day (BID) | ORAL | 0 refills | Status: AC

## 2023-01-18 NOTE — Progress Notes (Signed)

## 2023-01-18 NOTE — Progress Notes (Signed)
 I have spent 5 minutes in review of e-visit questionnaire, review and updating patient chart, medical decision making and response to patient.   Laure Kidney, PA-C

## 2023-01-27 ENCOUNTER — Ambulatory Visit: Payer: 59 | Admitting: Cardiology

## 2023-02-02 ENCOUNTER — Ambulatory Visit: Payer: 59 | Admitting: Cardiology

## 2023-02-11 DIAGNOSIS — G894 Chronic pain syndrome: Secondary | ICD-10-CM | POA: Diagnosis not present

## 2023-02-11 DIAGNOSIS — M47812 Spondylosis without myelopathy or radiculopathy, cervical region: Secondary | ICD-10-CM | POA: Diagnosis not present

## 2023-02-11 DIAGNOSIS — M47816 Spondylosis without myelopathy or radiculopathy, lumbar region: Secondary | ICD-10-CM | POA: Diagnosis not present

## 2023-02-11 DIAGNOSIS — M961 Postlaminectomy syndrome, not elsewhere classified: Secondary | ICD-10-CM | POA: Diagnosis not present

## 2023-02-13 ENCOUNTER — Ambulatory Visit: Payer: 59 | Attending: Cardiology | Admitting: Cardiology

## 2023-02-13 ENCOUNTER — Encounter: Payer: Self-pay | Admitting: Cardiology

## 2023-02-13 VITALS — BP 120/80 | HR 63 | Ht 67.0 in | Wt 218.0 lb

## 2023-02-13 DIAGNOSIS — I420 Dilated cardiomyopathy: Secondary | ICD-10-CM

## 2023-02-13 DIAGNOSIS — I480 Paroxysmal atrial fibrillation: Secondary | ICD-10-CM

## 2023-02-13 DIAGNOSIS — I10 Essential (primary) hypertension: Secondary | ICD-10-CM | POA: Diagnosis not present

## 2023-02-13 NOTE — Patient Instructions (Addendum)
 Medication Instructions:  Your physician recommends that you continue on your current medications as directed. Please refer to the Current Medication list given to you today.  *If you need a refill on your cardiac medications before your next appointment, please call your pharmacy*   Lab Work: None Ordered If you have labs (blood work) drawn today and your tests are completely normal, you will receive your results only by: MyChart Message (if you have MyChart) OR A paper copy in the mail If you have any lab test that is abnormal or we need to change your treatment, we will call you to review the results.   Testing/Procedures: None Ordered   Follow-Up: At [facility], you and your health needs are our priority.  As part of our continuing mission to provide you with exceptional heart care, we have created designated Provider Care Teams.  These Care Teams include your primary Cardiologist (physician) and Advanced Practice Providers (APPs -  Physician Assistants and Nurse Practitioners) who all work together to provide you with the care you need, when you need it.  We recommend signing up for the patient portal called "MyChart".  Sign up information is provided on this After Visit Summary.  MyChart is used to connect with patients for Virtual Visits (Telemedicine).  Patients are able to view lab/test results, encounter notes, upcoming appointments, etc.  Non-urgent messages can be sent to your provider as well.   To learn more about what you can do with MyChart, go to [email or web address].    Your next appointment:   3 month(s)  The format for your next appointment:   In Person  Provider:   Gypsy Balsam, MD    Other Instructions Appt with Dr. Elberta Fortis

## 2023-02-13 NOTE — Addendum Note (Signed)
Addended by: Baldo Ash D on: 02/13/2023 11:46 AM   Modules accepted: Orders
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
## 2023-02-18 DIAGNOSIS — Z4421 Encounter for fitting and adjustment of artificial right eye: Secondary | ICD-10-CM | POA: Diagnosis not present

## 2023-03-04 ENCOUNTER — Telehealth: Payer: 59 | Admitting: Physician Assistant

## 2023-03-04 DIAGNOSIS — R3989 Other symptoms and signs involving the genitourinary system: Secondary | ICD-10-CM

## 2023-03-04 MED ORDER — NITROFURANTOIN MONOHYD MACRO 100 MG PO CAPS: 100.0000 mg | ORAL_CAPSULE | Freq: Two times a day (BID) | ORAL | 0 refills | Status: DC

## 2023-03-04 NOTE — Progress Notes (Signed)

## 2023-03-07 ENCOUNTER — Encounter (HOSPITAL_BASED_OUTPATIENT_CLINIC_OR_DEPARTMENT_OTHER): Payer: Self-pay | Admitting: *Deleted

## 2023-03-07 ENCOUNTER — Emergency Department (HOSPITAL_BASED_OUTPATIENT_CLINIC_OR_DEPARTMENT_OTHER)
Admission: EM | Admit: 2023-03-07 | Discharge: 2023-03-07 | Disposition: A | Discharge status: Home / Self Care | Payer: 59 | Attending: Emergency Medicine | Admitting: Emergency Medicine

## 2023-03-07 ENCOUNTER — Other Ambulatory Visit: Payer: Self-pay

## 2023-03-07 ENCOUNTER — Emergency Department (HOSPITAL_BASED_OUTPATIENT_CLINIC_OR_DEPARTMENT_OTHER): Payer: 59

## 2023-03-07 DIAGNOSIS — M7918 Myalgia, other site: Secondary | ICD-10-CM | POA: Diagnosis not present

## 2023-03-07 DIAGNOSIS — R300 Dysuria: Secondary | ICD-10-CM | POA: Diagnosis not present

## 2023-03-07 DIAGNOSIS — J45909 Unspecified asthma, uncomplicated: Secondary | ICD-10-CM | POA: Insufficient documentation

## 2023-03-07 DIAGNOSIS — R059 Cough, unspecified: Secondary | ICD-10-CM | POA: Diagnosis not present

## 2023-03-07 DIAGNOSIS — R110 Nausea: Secondary | ICD-10-CM | POA: Insufficient documentation

## 2023-03-07 DIAGNOSIS — Z7901 Long term (current) use of anticoagulants: Secondary | ICD-10-CM | POA: Diagnosis not present

## 2023-03-07 DIAGNOSIS — R0981 Nasal congestion: Secondary | ICD-10-CM | POA: Insufficient documentation

## 2023-03-07 DIAGNOSIS — R051 Acute cough: Secondary | ICD-10-CM | POA: Diagnosis not present

## 2023-03-07 DIAGNOSIS — M791 Myalgia, unspecified site: Secondary | ICD-10-CM

## 2023-03-07 DIAGNOSIS — D696 Thrombocytopenia, unspecified: Secondary | ICD-10-CM | POA: Insufficient documentation

## 2023-03-07 DIAGNOSIS — R161 Splenomegaly, not elsewhere classified: Secondary | ICD-10-CM | POA: Diagnosis not present

## 2023-03-07 DIAGNOSIS — R109 Unspecified abdominal pain: Secondary | ICD-10-CM | POA: Diagnosis not present

## 2023-03-07 LAB — BASIC METABOLIC PANEL
Anion gap: 9 (ref 5–15)
BUN: 16 mg/dL (ref 6–20)
CO2: 26 mmol/L (ref 22–32)
Calcium: 8.8 mg/dL — ABNORMAL LOW (ref 8.9–10.3)
Chloride: 102 mmol/L (ref 98–111)
Creatinine, Ser: 0.61 mg/dL (ref 0.44–1.00)
GFR, Estimated: >60 mL/min (ref >60)
Glucose, Bld: 118 mg/dL — ABNORMAL HIGH (ref 70–99)
Potassium: 4.2 mmol/L (ref 3.5–5.1)
Sodium: 137 mmol/L (ref 135–145)

## 2023-03-07 LAB — HEPATIC FUNCTION PANEL
ALT: 50 U/L — ABNORMAL HIGH (ref 0–44)
AST: 89 U/L — ABNORMAL HIGH (ref 15–41)
Albumin: 3.8 g/dL (ref 3.5–5.0)
Alkaline Phosphatase: 90 U/L (ref 38–126)
Bilirubin, Direct: 0.2 mg/dL (ref 0.0–0.2)
Indirect Bilirubin: 0.7 mg/dL (ref 0.3–0.9)
Total Bilirubin: 0.9 mg/dL (ref 0.0–1.2)
Total Protein: 7.4 g/dL (ref 6.5–8.1)

## 2023-03-07 LAB — CBC
HCT: 42.1 % (ref 36.0–46.0)
Hemoglobin: 14.1 g/dL (ref 12.0–15.0)
MCH: 29.6 pg (ref 26.0–34.0)
MCHC: 33.5 g/dL (ref 30.0–36.0)
MCV: 88.3 fL (ref 80.0–100.0)
Platelets: 113 10*3/uL — ABNORMAL LOW (ref 150–400)
RBC: 4.77 MIL/uL (ref 3.87–5.11)
RDW: 13.5 % (ref 11.5–15.5)
WBC: 4.3 10*3/uL (ref 4.0–10.5)
nRBC: 0 % (ref 0.0–0.2)

## 2023-03-07 LAB — TROPONIN I (HIGH SENSITIVITY): Troponin I (High Sensitivity): 3 ng/L (ref <18)

## 2023-03-07 LAB — URINALYSIS, ROUTINE W REFLEX MICROSCOPIC
Bilirubin Urine: NEGATIVE
Glucose, UA: NEGATIVE mg/dL
Ketones, ur: 15 mg/dL — AB
Leukocytes,Ua: NEGATIVE
Nitrite: NEGATIVE
Protein, ur: NEGATIVE mg/dL
Specific Gravity, Urine: 1.02 (ref 1.005–1.030)
pH: 6 (ref 5.0–8.0)

## 2023-03-07 LAB — RESP PANEL BY RT-PCR (RSV, FLU A&B, COVID)  RVPGX2
Influenza A by PCR: NEGATIVE
Influenza B by PCR: NEGATIVE
Resp Syncytial Virus by PCR: NEGATIVE
SARS Coronavirus 2 by RT PCR: NEGATIVE

## 2023-03-07 LAB — URINALYSIS, MICROSCOPIC (REFLEX)

## 2023-03-07 MED ORDER — ONDANSETRON 4 MG PO TBDP: 4.0000 mg | ORAL_TABLET | Freq: Three times a day (TID) | ORAL | 0 refills | Status: DC | PRN

## 2023-03-07 MED ORDER — POLYETHYLENE GLYCOL 3350 17 G PO PACK: 17.0000 g | PACK | Freq: Every day | ORAL | 0 refills | Status: DC

## 2023-03-07 MED ORDER — SODIUM CHLORIDE 0.9 % IV SOLN
12.5000 mg | Freq: Once | INTRAVENOUS | Status: AC
  Administered 2023-03-07: 12.5 mg via INTRAVENOUS
  Filled 2023-03-07: qty 0.5

## 2023-03-07 MED ORDER — CEFADROXIL 500 MG PO CAPS: 500.0000 mg | ORAL_CAPSULE | Freq: Two times a day (BID) | ORAL | 0 refills | Status: DC

## 2023-03-07 MED ORDER — PROMETHAZINE HCL 25 MG/ML IJ SOLN
INTRAMUSCULAR | Status: AC
  Filled 2023-03-07: qty 1

## 2023-03-07 MED ORDER — CEPHALEXIN 250 MG PO CAPS
500.0000 mg | ORAL_CAPSULE | Freq: Once | ORAL | Status: AC
  Administered 2023-03-07: 500 mg via ORAL
  Filled 2023-03-07: qty 2

## 2023-03-07 MED ORDER — SODIUM CHLORIDE 0.9 % IV BOLUS
500.0000 mL | Freq: Once | INTRAVENOUS | Status: AC
  Administered 2023-03-07: 500 mL via INTRAVENOUS

## 2023-03-07 MED ORDER — ONDANSETRON 4 MG PO TBDP
8.0000 mg | ORAL_TABLET | Freq: Once | ORAL | Status: AC
  Administered 2023-03-07: 8 mg via ORAL
  Filled 2023-03-07: qty 2

## 2023-03-07 NOTE — ED Triage Notes (Signed)
 Pt has been feeling nausea since 3am.  Pt has been having body aches and chills with this as well as severe headache. Pt began having symptoms of UTI with burning and malodorous urine which began Tuesday (she was placed on macrobid after e-visit for this)

## 2023-03-07 NOTE — Discharge Instructions (Addendum)
 As discussed, CT scan did show evidence of large stool burden consistent with constipation.  Recommend use of MiraLAX at home titrated to effect.  Begin with 2-4 capfuls/packets and increase as needed for bowel movements or decrease if you develop diarrhea.  We we will change your antibiotic given concern for possible kidney infection to a medication because the Macrobid will not treat a kidney infection.  Your blood tests otherwise look normal.  Will send you with nausea medicine to use as needed.  Please do not hesitate to return if the worrisome signs and symptoms we discussed become apparent.

## 2023-03-07 NOTE — ED Provider Notes (Signed)
 [name] Provider Note   CSN: [id number] Arrival date & time: 03/07/23  1301     History  Chief Complaint  Patient presents with   Generalized Body Aches    Megan [name] is a 58 y.o. female.  HPI   58 year old female presents emergency department complaints of bodyaches chills, nausea, cough.  Patient reports symptoms present on awakening this morning.  States that she has been having malodorous urine ever since Tuesday.  Had an e-visit at that time and was placed on Macrobid but did not pick it up told on Thursday and began taking it yesterday.  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  Denies any abdominal pain but does states she has some "kidney pain."  Patient also states that she has been having frontal headache.  Noticed more so this morning.  Denies any visual disturbance or baseline, gait abnormality, slurred speech, facial droop, weakness/sensory deficits in upper/lower extremities.  Denies any vomiting, chest pain, shortness of breath.  Has been taking Tylenol for her pain/fever which has been helping.  Past medical history significant for retinoblastoma, and deficiency anemia, chronic pain syndrome, gastroparesis, autoimmune hepatitis, atrial fibrillation on Eliquis, asthma, Hashimoto's, thrombocytopenia, dilated cardiomyopathy, ascending aortic aneurysm, cirrhosis  Home Medications Prior to Admission medications   Medication Sig Start Date End Date Taking? Authorizing Provider  cefadroxil (DURICEF) 500 MG capsule Take 1 capsule (500 mg total) by mouth 2 (two) times daily. 03/07/23  Yes Sherian Maroon A, PA  ondansetron (ZOFRAN-ODT) 4 MG disintegrating tablet Take 1 tablet (4 mg total) by mouth every 8 (eight) hours as needed. 03/07/23  Yes Sherian Maroon A, PA  polyethylene glycol (MIRALAX) 17 g packet Take 17 g by mouth daily. 03/07/23  Yes Sherian Maroon A, PA  apixaban (ELIQUIS) 5 MG TABS tablet Take 1 tablet (5 mg total) by mouth 2 (two) times daily. 09/24/22   Georgeanna Lea, MD  azaTHIOprine (IMURAN) 50 MG tablet Take 50 mg by mouth daily. 07/23/20   [provider]  Azelastine HCl 137 MCG/SPRAY SOLN PLACE 1 SPRAY INTO BOTH NOSTRILS 2 (TWO) TIMES DAILY. USE IN EACH NOSTRIL AS DIRECTED Patient taking differently: Place 1 spray into both nostrils in the morning and at bedtime. 07/19/21   [name], NP  baclofen (LIORESAL) 10 MG tablet Take 10 mg by mouth at bedtime.    [provider]  CALCIUM PO Take 1,300 mg by mouth daily. 650 mg each Chewable    [provider]  Multiple Vitamins-Iron (MULTIPLE VITAMIN/IRON PO) Take 1 tablet by mouth daily. Chewable    [provider]  neomycin-polymyxin b-dexamethasone (MAXITROL) 3.5-10000-0.1 OINT Place 1 Application into both eyes at bedtime.    [provider]  nitrofurantoin, macrocrystal-monohydrate, (MACROBID) 100 MG capsule Take 1 capsule (100 mg total) by mouth 2 (two) times daily. 03/04/23   Margaretann Loveless, PA-C  oxyCODONE (OXY IR/ROXICODONE) 5 MG immediate release tablet Take 5 mg by mouth every 6 (six) hours as needed for pain. 02/28/19   [provider]  Propylene Glycol-Glycerin (SOOTHE) 0.6-0.6 % SOLN Place 1 drop into both eyes daily as needed (dryness).    [provider]  SYNTHROID 137 MCG tablet Take 1 tablet (137 mcg total) by mouth daily before breakfast. 01/12/23   [name], NP      Allergies    Hydrocodone, Penicillins, Gadolinium derivatives, Bioflavonoid products, Chantix [varenicline],  Cymbalta [duloxetine hcl], Glucosamine forte [nutritional supplements], Metoclopramide, Morphine, and Codeine    Review of Systems   Review of Systems  All other systems reviewed and are negative.   Physical Exam Updated Vital Signs BP 113/76 (BP Location: Left Arm)   Pulse (!) 101   Temp 98.3 F  (36.8 C) (Oral)   Resp 16   LMP 04/10/2013 Comment: tubel ligation  SpO2 100%  Physical Exam Vitals and nursing note reviewed.  Constitutional:      General: She is not in acute distress.    Appearance: She is well-developed.  HENT:     Head: Normocephalic and atraumatic.     Nose: Congestion present.     Mouth/Throat:     Pharynx: No oropharyngeal exudate or posterior oropharyngeal erythema.  Eyes:     Conjunctiva/sclera: Conjunctivae normal.  Cardiovascular:     Rate and Rhythm: Normal rate and regular rhythm.     Heart sounds: No murmur heard. Pulmonary:     Effort: Pulmonary effort is normal. No respiratory distress.     Breath sounds: Normal breath sounds. No wheezing, rhonchi or rales.  Abdominal:     Palpations: Abdomen is soft.     Tenderness: There is no abdominal tenderness.     Comments: Possible left-sided CVA tenderness versus paraspinal tenderness in lumbar region.  Musculoskeletal:        General: No swelling.     Cervical back: Neck supple.     Right lower leg: No edema.     Left lower leg: No edema.  Skin:    General: Skin is warm and dry.     Capillary Refill: Capillary refill takes less than 2 seconds.  Neurological:     Mental Status: She is alert.  Psychiatric:        Mood and Affect: Mood normal.     ED Results / Procedures / Treatments   Labs (all labs ordered are listed, but only abnormal results are displayed) Labs Reviewed  URINALYSIS, ROUTINE W REFLEX MICROSCOPIC - Abnormal; Notable for the following components:      Result Value   Hgb urine dipstick SMALL (*)    Ketones, ur 15 (*)    All other components within normal limits  CBC - Abnormal; Notable for the following components:   Platelets 113 (*)    All other components within normal limits  BASIC METABOLIC PANEL - Abnormal; Notable for the following components:   Glucose, Bld 118 (*)    Calcium 8.8 (*)    All other components within normal limits  URINALYSIS, MICROSCOPIC  (REFLEX) - Abnormal; Notable for the following components:   Bacteria, UA RARE (*)    All other components within normal limits  HEPATIC FUNCTION PANEL - Abnormal; Notable for the following components:   AST 89 (*)    ALT 50 (*)    All other components within normal limits  RESP PANEL BY RT-PCR (RSV, FLU A&B, COVID)  [id number]  URINE CULTURE  TROPONIN I (HIGH SENSITIVITY)    EKG None  Radiology CT Renal Stone Study Result Date: 03/07/2023 CLINICAL DATA:  Acute abdominal and flank pain beginning this morning. Chills. Nausea. Dysuria. EXAM: CT ABDOMEN AND PELVIS WITHOUT CONTRAST TECHNIQUE: Multidetector CT imaging of the abdomen and pelvis was performed following the standard protocol without IV contrast. RADIATION DOSE REDUCTION: This exam was performed according to the departmental dose-optimization program which includes automated exposure control, adjustment of the mA and/or kV according to patient size and/or use of  iterative reconstruction technique. COMPARISON:  01/05/2012 FINDINGS: Lower chest: No acute findings. Hepatobiliary: No mass visualized on this unenhanced exam. Capsular nodularity again seen, suspicious for cirrhosis. Prior cholecystectomy. No evidence of biliary obstruction. Pancreas: No mass or inflammatory process visualized on this unenhanced exam. Spleen: Mild splenomegaly, which may be seen with portal venous hypertension. Adrenals/Urinary tract: No evidence of urolithiasis or hydronephrosis. Unremarkable unopacified urinary bladder. Stomach/Bowel: Prior sleeve gastrectomy. No evidence of obstruction, inflammatory process, or abnormal fluid collections. Large stool burden noted throughout the colon. Vascular/Lymphatic: No pathologically enlarged lymph nodes identified. No evidence of abdominal aortic aneurysm. Reproductive:  No mass or other significant abnormality. Other:  None. Musculoskeletal: No suspicious bone lesions identified. Lumbar spine fusion hardware noted.  IMPRESSION: No evidence of urolithiasis, hydronephrosis, or other acute findings. Probable cirrhosis, and mild splenomegaly suspicious for portal venous hypertension. Large stool burden noted; recommend clinical correlation for possible constipation. Electronically Signed   By: Danae Orleans [name]   On: 03/07/2023 16:42    Procedures Procedures    Medications Ordered in ED Medications  promethazine (PHENERGAN) 25 MG/ML injection (  Not Given 03/07/23 1556)  ondansetron (ZOFRAN-ODT) disintegrating tablet 8 mg (8 mg Oral Given 03/07/23 1404)  promethazine (PHENERGAN) 12.5 mg in sodium chloride 0.9 % 50 mL IVPB (0 mg Intravenous Paused 03/07/23 1642)  sodium chloride 0.9 % bolus 500 mL (500 mLs Intravenous New Bag/Given 03/07/23 1644)    ED Course/ Medical Decision Making/ A&P                                 Medical Decision Making Amount and/or Complexity of Data Reviewed Labs: ordered. Radiology: ordered.  Risk Prescription drug management.   This patient presents to the ED for concern of Cough, body aches, nausea, malodorous urine, this involves an extensive number of treatment options, and is a complaint that carries with it a high risk of complications and morbidity.  The differential diagnosis includes COVID, flu, RSV, UTI, pyelonephritis, sepsis, other   Co morbidities that complicate the patient evaluation  See HPI   Additional history obtained:  Additional history obtained from EMR External records from outside source obtained and reviewed including hospital records   Lab Tests:  I Ordered, and personally interpreted labs.  The pertinent results include: No leukocytosis.  No evidence of anemia.  Thrombocytopenia 113.  Mild hypocalcemia of 8.8 but otherwise, electrolytes within normal limits.  No renal dysfunction.  UA with rare bacteria, 15 ketones but otherwise unremarkable.  Viral testing negative.  Slight transaminitis AST of 89, ALT of 50.  Troponin  normal.   Imaging Studies ordered:  I ordered imaging studies including CT renal stone study I independently visualized and interpreted imaging which showed large amount of stool burden.  Cirrhosis with splenomegaly I agree with the radiologist interpretation   Cardiac Monitoring: / EKG:  The patient was maintained on a cardiac monitor.  I personally viewed and interpreted the cardiac monitored which showed an underlying rhythm of: Sinus rhythm   Consultations Obtained:  N/a   Problem List / ED Course / Critical interventions / Medication management  Cough, nausea, chills, body ache, malodorous urine, constipation I ordered medication including Zofran, promethazine, 500 cc normal saline   Reevaluation of the patient after these medicines showed that the patient improved I have reviewed the patients home medicines and have made adjustments as needed   Social Determinants of Health:  Former cigarette use.  Denies illicit drug use.   Test / Admission - Considered:  Cough, nausea, chills, body ache, malodorous urine, constipation Vitals signs within normal range and stable throughout visit. Laboratory/imaging studies significant for: See above 58 year old female presents emergency department with complaints of bodyaches, chills, nausea, cough.  Symptom onset this morning.  Patient additionally with malodorous urine complaints since Tuesday and has recently started Macrobid yesterday.  On exam, lungs clear to auscultation bilaterally; low suspicion for pneumonia.  Patient with possible CVA tenderness on exam; in the setting of current treatment for UTI as well as history of nephrolithiasis, CT imaging obtained patient's abdomen which was negative for any nephrolithiasis.  Given still some concern for potential pyelonephritis despite lack of inflammatory changes around kidney on CT imaging and UA currently with only rare bacteria present, will switch patient's antibiotics to [facility]  and culture urine.  Suspect the patient's urine is being affected by Macrobid therapy.  Patient with negative viral testing.  Suspect the patient's respiratory symptoms are likely secondary to upper respiratory viral illness.  Recommend symptomatic therapy as described in AVS and close follow-up with PCP in the outpatient setting.  Treatment plan discussed at length with patient and she acknowledged understanding was agreeable to said plan.  Patient overall well-appearing, afebrile in no acute distress. Worrisome signs and symptoms were discussed with the patient, and the patient acknowledged understanding to return to the ED if noticed. Patient was stable upon discharge.          Final Clinical Impression(s) / ED Diagnoses Final diagnoses:  Acute cough  Nasal congestion  Myalgia  Nausea    Rx / DC Orders ED Discharge Orders          Ordered    cefadroxil (DURICEF) 500 MG capsule  2 times daily        03/07/23 1648    polyethylene glycol (MIRALAX) 17 g packet  Daily        03/07/23 1648    ondansetron (ZOFRAN-ODT) 4 MG disintegrating tablet  Every 8 hours PRN        03/07/23 1649              Peter Garter, [place] 03/07/23 1713    Alvira Monday, MD 03/08/23 [phone]

## 2023-03-08 LAB — URINE CULTURE: Culture: NO GROWTH

## 2023-03-11 ENCOUNTER — Ambulatory Visit (INDEPENDENT_AMBULATORY_CARE_PROVIDER_SITE_OTHER): Payer: 59 | Admitting: *Deleted

## 2023-03-11 DIAGNOSIS — M47816 Spondylosis without myelopathy or radiculopathy, lumbar region: Secondary | ICD-10-CM | POA: Diagnosis not present

## 2023-03-11 DIAGNOSIS — M47812 Spondylosis without myelopathy or radiculopathy, cervical region: Secondary | ICD-10-CM | POA: Diagnosis not present

## 2023-03-11 DIAGNOSIS — G894 Chronic pain syndrome: Secondary | ICD-10-CM | POA: Diagnosis not present

## 2023-03-11 DIAGNOSIS — M961 Postlaminectomy syndrome, not elsewhere classified: Secondary | ICD-10-CM | POA: Diagnosis not present

## 2023-03-11 DIAGNOSIS — Z0000 Encounter for general adult medical examination without abnormal findings: Secondary | ICD-10-CM

## 2023-03-11 NOTE — Patient Instructions (Signed)
 Ms. Gudger , Thank you for taking time to come for your Medicare Wellness Visit. I appreciate your ongoing commitment to your health goals. Please review the following plan we discussed and let me know if I can assist you in the future.     This is a list of the screening recommended for you and due dates:  Health Maintenance  Topic Date Due   Pneumococcal Vaccination (1 of 2 - PCV) Never done   Zoster (Shingles) Vaccine (1 of 2) Never done   COVID-19 Vaccine (3 - Pfizer risk series) 08/25/2019   Yearly kidney health urinalysis for diabetes  04/22/2027*   Yearly kidney function blood test for diabetes  03/06/2024   Medicare Annual Wellness Visit  03/10/2024   DTaP/Tdap/Td vaccine (2 - Td or Tdap) 07/18/2024   Pap with HPV screening  11/28/2024   Mammogram  12/22/2024   Colon Cancer Screening  10/24/2029   Flu Shot  Completed   Hepatitis C Screening  Completed   HIV Screening  Completed   HPV Vaccine  Aged Out   Complete foot exam   Discontinued   Hemoglobin A1C  Discontinued   Eye exam for diabetics  Discontinued  *Topic was postponed. The date shown is not the original due date.     Next appointment: Follow up in one year for your annual wellness visit.   Preventive Care 40-64 Years, Female Preventive care refers to lifestyle choices and visits with your health care provider that can promote health and wellness. What does preventive care include? A yearly physical exam. This is also called an annual well check. Dental exams once or twice a year. Routine eye exams. Ask your health care provider how often you should have your eyes checked. Personal lifestyle choices, including: Daily care of your teeth and gums. Regular physical activity. Eating a healthy diet. Avoiding tobacco and drug use. Limiting alcohol use. Practicing safe sex. Taking low-dose aspirin daily starting at age 34. Taking vitamin and mineral supplements as recommended by your health care provider. What  happens during an annual well check? The services and screenings done by your health care provider during your annual well check will depend on your age, overall health, lifestyle risk factors, and family history of disease. Counseling  Your health care provider may ask you questions about your: Alcohol use. Tobacco use. Drug use. Emotional well-being. Home and relationship well-being. Sexual activity. Eating habits. Work and work Astron[employer]. Method of birth control. Menstrual cycle. Pregnancy history. Screening  You may have the following tests or measurements: Height, weight, and BMI. Blood pressure. Lipid and cholesterol levels. These may be checked every 5 years, or more frequently if you are over 98 years old. Skin check. Lung cancer screening. You may have this screening every year starting at age 41 if you have a 30-pack-year history of smoking and currently smoke or have quit within the past 15 years. Fecal occult blood test (FOBT) of the stool. You may have this test every year starting at age 56. Flexible sigmoidoscopy or colonoscopy. You may have a sigmoidoscopy every 5 years or a colonoscopy every 10 years starting at age 52. Hepatitis C blood test. Hepatitis B blood test. Sexually transmitted disease (STD) testing. Diabetes screening. This is done by checking your blood sugar (glucose) after you have not eaten for a while (fasting). You may have this done every 1-3 years. Mammogram. This may be done every 1-2 years. Talk to your health care provider about when you should start  having regular mammograms. This may depend on whether you have a family history of breast cancer. BRCA-related cancer screening. This may be done if you have a family history of breast, ovarian, tubal, or peritoneal cancers. Pelvic exam and Pap test. This may be done every 3 years starting at age 4. Starting at age 21, this may be done every 5 years if you have a Pap test in combination with an HPV  test. Bone density scan. This is done to screen for osteoporosis. You may have this scan if you are at high risk for osteoporosis. Discuss your test results, treatment options, and if necessary, the need for more tests with your health care provider. Vaccines  Your health care provider may recommend certain vaccines, such as: Influenza vaccine. This is recommended every year. Tetanus, diphtheria, and acellular pertussis (Tdap, Td) vaccine. You may need a Td booster every 10 years. Zoster vaccine. You may need this after age 55. Pneumococcal 13-valent conjugate (PCV13) vaccine. You may need this if you have certain conditions and were not previously vaccinated. Pneumococcal polysaccharide (PPSV23) vaccine. You may need one or two doses if you smoke cigarettes or if you have certain conditions. Talk to your health care provider about which screenings and vaccines you need and how often you need them. This information is not intended to replace advice given to you by your health care provider. Make sure you discuss any questions you have with your health care provider. Document Released: 01/26/2015 Document Revised: 09/19/2015 Document Reviewed: 10/31/2014 Elsevier Interactive Patient Education  2017 [employer].    Fall Prevention in the Home Falls can cause injuries. They can happen to people of all ages. There are many things you can do to make your home safe and to help prevent falls. What can I do on the outside of my home? Regularly fix the edges of walkways and driveways and fix any cracks. Remove anything that might make you trip as you walk through a door, such as a raised step or threshold. Trim any bushes or trees on the path to your home. Use bright outdoor lighting. Clear any walking paths of anything that might make someone trip, such as rocks or tools. Regularly check to see if handrails are loose or broken. Make sure that both sides of any steps have handrails. Any raised  decks and porches should have guardrails on the edges. Have any leaves, snow, or ice cleared regularly. Use sand or salt on walking paths during winter. Clean up any spills in your garage right away. This includes oil or grease spills. What can I do in the bathroom? Use night lights. Install grab bars by the toilet and in the tub and shower. Do not use towel bars as grab bars. Use non-skid mats or decals in the tub or shower. If you need to sit down in the shower, use a plastic, non-slip stool. Keep the floor dry. Clean up any water that spills on the floor as soon as it happens. Remove soap buildup in the tub or shower regularly. Attach bath mats securely with double-sided non-slip rug tape. Do not have throw rugs and other things on the floor that can make you trip. What can I do in the bedroom? Use night lights. Make sure that you have a light by your bed that is easy to reach. Do not use any sheets or blankets that are too big for your bed. They should not hang down onto the floor. Have a firm chair that  has side arms. You can use this for support while you get dressed. Do not have throw rugs and other things on the floor that can make you trip. What can I do in the kitchen? Clean up any spills right away. Avoid walking on wet floors. Keep items that you use a lot in easy-to-reach places. If you need to reach something above you, use a strong step stool that has a grab bar. Keep electrical cords out of the way. Do not use floor polish or wax that makes floors slippery. If you must use wax, use non-skid floor wax. Do not have throw rugs and other things on the floor that can make you trip. What can I do with my stairs? Do not leave any items on the stairs. Make sure that there are handrails on both sides of the stairs and use them. Fix handrails that are broken or loose. Make sure that handrails are as long as the stairways. Check any carpeting to make sure that it is firmly attached  to the stairs. Fix any carpet that is loose or worn. Avoid having throw rugs at the top or bottom of the stairs. If you do have throw rugs, attach them to the floor with carpet tape. Make sure that you have a light switch at the top of the stairs and the bottom of the stairs. If you do not have them, ask someone to add them for you. What else can I do to help prevent falls? Wear shoes that: Do not have high heels. Have rubber bottoms. Are comfortable and fit you well. Are closed at the toe. Do not wear sandals. If you use a stepladder: Make sure that it is fully opened. Do not climb a closed stepladder. Make sure that both sides of the stepladder are locked into place. Ask someone to hold it for you, if possible. Clearly mark and make sure that you can see: Any grab bars or handrails. First and last steps. Where the edge of each step is. Use tools that help you move around (mobility aids) if they are needed. These include: Canes. Walkers. Scooters. Crutches. Turn on the lights when you go into a dark area. Replace any light bulbs as soon as they burn out. Set up your furniture so you have a clear path. Avoid moving your furniture around. If any of your floors are uneven, fix them. If there are any pets around you, be aware of where they are. Review your medicines with your doctor. Some medicines can make you feel dizzy. This can increase your chance of falling. Ask your doctor what other things that you can do to help prevent falls. This information is not intended to replace advice given to you by your health care provider. Make sure you discuss any questions you have with your health care provider. Document Released: 10/26/2008 Document Revised: 06/07/2015 Document Reviewed: 02/03/2014 Elsevier Interactive Patient Education  2017 [employer].
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
## 2023-03-12 ENCOUNTER — Telehealth: Payer: Self-pay | Admitting: *Deleted

## 2023-03-12 NOTE — Telephone Encounter (Signed)
 Who Is Calling Patient / Member / Family / Caregiver Call Type Triage / Clinical Relationship To Patient Self Return Phone Number [phone] (Primary) Chief Complaint Fever (non-urgent symptom) (greater than THREE MONTHS old) Reason for Call Symptomatic / Request for Health Information Initial Comment Caller states she has a fever. She is being treated for a UTI. She has really bad chills. She states she is also nauseous. GOTO Facility Not Listed [facility] Translation No Nurse Assessment Nurse: Andrey Campanile, RN, Marylene Land Date/Time Lamount Cohen Time): 03/07/2023 8:42:44 AM Confirm and document reason for call. If symptomatic, describe symptoms. ---Caller states she has a fever(no thermometer), felt very hot. . She is being treated for a UTI with Macrobid. Prosthetic eye. Eye infection, doctor called in eye drops and ointment. . Was on Doxyclycline. She has really bad chills. She states she is also nauseated. Symptoms started at 0300. Took Tylenol. Does the patient have any new or worsening symptoms? ---Yes Will a triage be completed? ---Yes Related visit to physician within the last 2 weeks? ---Yes Does the PT have any chronic conditions? (i.e. diabetes, asthma, this includes High risk factors for pregnancy, etc.) ---Yes List chronic conditions. ---CHF, Cirrhosis, Hypothyroidism. Is this a behavioral health or substance abuse call? ---No Guidelines Guideline Title Affirmed Question Affirmed Notes Nurse Date/Time (Eastern Time) Influenza (Flu) - Seasonal Patient sounds very sick or weak to the triager Andrey Campanile, RN, Marylene Land 03/07/2023 8:49:58 AM PLEASE NOTE: All timestamps contained within this report are represented as [place] Standard Time. CONFIDENTIALTY NOTICE: This fax transmission is intended only for the addressee. It contains information that is legally privileged, confidential or otherwise protected from use or disclosure. If you are not the intended recipient, you are  strictly prohibited from reviewing, disclosing, copying using or disseminating any of this information or taking any action in reliance on or regarding this information. If you have received this fax in error, please notify us immediately by telephone so that we can arrange for its return to [place]. Phone: [phone], Toll-Free: [phone], Fax: [phone] [email or web address] [birth year] Page: 2 of 2 CallId: [id number] Disp. Time Lamount Cohen Time) Disposition Final User 03/07/2023 7:53:17 AM Send To RN Personal Thana Farr, RN, Ladona Ridgel 03/07/2023 8:52:34 AM Go to ED Now (or PCP triage) Yes Andrey Campanile, RN, Marylene Land Final Disposition 03/07/2023 8:52:34 AM Go to ED Now (or PCP triage) Yes Andrey Campanile, RN, Marylene Land

## 2023-03-12 NOTE — Telephone Encounter (Signed)
 Pt went to ED on 03/07/23

## 2023-03-23 ENCOUNTER — Ambulatory Visit: Payer: 59 | Admitting: Cardiology

## 2023-03-25 ENCOUNTER — Ambulatory Visit (INDEPENDENT_AMBULATORY_CARE_PROVIDER_SITE_OTHER): Admitting: Physician Assistant

## 2023-03-25 ENCOUNTER — Encounter: Payer: Self-pay | Admitting: Physician Assistant

## 2023-03-25 VITALS — BP 115/75 | HR 91 | Resp 16 | Ht 67.0 in | Wt 215.0 lb

## 2023-03-25 DIAGNOSIS — M545 Low back pain, unspecified: Secondary | ICD-10-CM | POA: Diagnosis not present

## 2023-03-25 DIAGNOSIS — K7460 Unspecified cirrhosis of liver: Secondary | ICD-10-CM

## 2023-03-25 LAB — POCT URINALYSIS DIP (MANUAL ENTRY)
Bilirubin, UA: NEGATIVE
Blood, UA: NEGATIVE
Glucose, UA: NEGATIVE mg/dL
Ketones, POC UA: NEGATIVE mg/dL
Leukocytes, UA: NEGATIVE
Nitrite, UA: NEGATIVE
Protein Ur, POC: NEGATIVE mg/dL
Spec Grav, UA: 1.01 (ref 1.010–1.025)
Urobilinogen, UA: 0.2 U/dL
pH, UA: 6.5 (ref 5.0–8.0)

## 2023-03-25 NOTE — Assessment & Plan Note (Signed)
 Slight increase in lfts, still < 100.  Pt has upcoming appt w/ specialist.

## 2023-03-25 NOTE — Progress Notes (Signed)
 Established patient visit   Patient: Megan [name]   DOB: [birth year]   58 y.o. Female  MRN: [id number] Visit Date: 03/25/2023  Today's healthcare provider: Alfredia Ferguson, PA-C   Cc. ED f/u   Subjective     Pt was seen in the ED 03/07/23 with chills, nausea, backache, cough.  There was suspicion for UTI, pt tx with cefadroxil. Culture was negative.  She had a CT abd/pelvis,  No evidence of urolithiasis, hydronephrosis, or other acute findings.  On labs, lfts were mildly elevated.  Pt reports persistent low back pain, not responding to tylenol, oxycodone. She does take baclofen at night, unsure if this helps her pain. Does not like to take it during the day.   Medications: Outpatient Medications Prior to Visit  Medication Sig   apixaban (ELIQUIS) 5 MG TABS tablet Take 1 tablet (5 mg total) by mouth 2 (two) times daily.   azaTHIOprine (IMURAN) 50 MG tablet Take 50 mg by mouth daily.   Azelastine HCl 137 MCG/SPRAY SOLN PLACE 1 SPRAY INTO BOTH NOSTRILS 2 (TWO) TIMES DAILY. USE IN EACH NOSTRIL AS DIRECTED (Patient taking differently: Place 1 spray into both nostrils in the morning and at bedtime.)   baclofen (LIORESAL) 10 MG tablet Take 10 mg by mouth at bedtime.   CALCIUM PO Take 1,300 mg by mouth daily. 650 mg each Chewable   Multiple Vitamins-Iron (MULTIPLE VITAMIN/IRON PO) Take 1 tablet by mouth daily. Chewable   neomycin-polymyxin b-dexamethasone (MAXITROL) 3.5-10000-0.1 OINT Place 1 Application into both eyes at bedtime.   ondansetron (ZOFRAN-ODT) 4 MG disintegrating tablet Take 1 tablet (4 mg total) by mouth every 8 (eight) hours as needed.   oxyCODONE (OXY IR/ROXICODONE) 5 MG immediate release tablet Take 5 mg by mouth every 6 (six) hours as needed for pain.   polyethylene glycol (MIRALAX) 17 g packet Take 17 g by mouth daily.   Propylene Glycol-Glycerin (SOOTHE) 0.6-0.6 % SOLN Place 1 drop into both eyes daily as needed (dryness).   SYNTHROID 137 MCG tablet Take 1  tablet (137 mcg total) by mouth daily before breakfast.   [DISCONTINUED] cefadroxil (DURICEF) 500 MG capsule Take 1 capsule (500 mg total) by mouth 2 (two) times daily.   No facility-administered medications prior to visit.    Review of Systems  Constitutional:  Negative for fatigue and fever.  Respiratory:  Negative for cough and shortness of breath.   Cardiovascular:  Negative for chest pain and leg swelling.  Gastrointestinal:  Negative for abdominal pain.  Musculoskeletal:  Positive for back pain.  Neurological:  Negative for dizziness and headaches.       Objective    BP 115/75 (BP Location: Left Arm, Patient Position: Sitting, Cuff Size: Large)   Pulse 91   Resp 16   Ht 5\' 7"  (1.702 m)   Wt 215 lb (97.5 kg)   LMP 04/10/2013 Comment: tubel ligation  SpO2 97%   BMI 33.67 kg/m    Physical Exam Constitutional:      General: She is awake.     Appearance: She is well-developed.  HENT:     Head: Normocephalic.  Eyes:     Conjunctiva/sclera: Conjunctivae normal.  Cardiovascular:     Rate and Rhythm: Normal rate and regular rhythm.     Heart sounds: Normal heart sounds.  Pulmonary:     Effort: Pulmonary effort is normal.     Breath sounds: Normal breath sounds.  Musculoskeletal:     Comments: No edema, erythema, ecchymosis  of lumbar spine.   Skin:    General: Skin is warm.  Neurological:     Mental Status: She is alert and oriented to person, place, and time.  Psychiatric:        Attention and Perception: Attention normal.        Mood and Affect: Mood normal.        Speech: Speech normal.        Behavior: Behavior is cooperative.     Results for orders placed or performed in visit on 03/25/23  POCT urinalysis dipstick  Result Value Ref Range   Color, UA yellow yellow   Clarity, UA clear clear   Glucose, UA negative negative mg/dL   Bilirubin, UA negative negative   Ketones, POC UA negative negative mg/dL   Spec Grav, UA 4.098 1.191 - 1.025   Blood, UA  negative negative   pH, UA 6.5 5.0 - 8.0   Protein Ur, POC negative negative mg/dL   Urobilinogen, UA 0.2 0.2 or 1.0 E.U./dL   Nitrite, UA Negative Negative   Leukocytes, UA Negative Negative    Assessment & Plan    Acute bilateral low back pain without sciatica -     POCT urinalysis dipstick -     Ambulatory referral to Neurosurgery  Cirrhosis of liver without ascites, unspecified hepatic cirrhosis type (HCC) Assessment & Plan: Slight increase in lfts, still < 100.  Pt has upcoming appt w/ specialist.    Advised pt her urine culture was negative. She denies any urinary symptoms today, but wonders what is causing the back pain.  Symptoms seem musculoskeletal to me-- suggested lidocaine patch, topical therapy, pt declines as these do not work. Suggested taking additional doses of baclofen-- pt declines as it causes drowsiness.   Recommending, heat, ice, stretching. Pt requesting to be sent back to Dr Lovell Sheehan. Likely does need a referral, last seen 2023, but will place one.  Repeat UA clear  Return if symptoms worsen or fail to improve.       Alfredia Ferguson, PA-C  [facility] [phone] (phone) [phone] (fax)  [facility]

## 2023-03-26 DIAGNOSIS — K7469 Other cirrhosis of liver: Secondary | ICD-10-CM | POA: Diagnosis not present

## 2023-03-26 DIAGNOSIS — K754 Autoimmune hepatitis: Secondary | ICD-10-CM | POA: Diagnosis not present

## 2023-03-31 ENCOUNTER — Other Ambulatory Visit: Payer: Self-pay | Admitting: Family

## 2023-04-07 ENCOUNTER — Other Ambulatory Visit

## 2023-04-07 DIAGNOSIS — M961 Postlaminectomy syndrome, not elsewhere classified: Secondary | ICD-10-CM | POA: Diagnosis not present

## 2023-04-07 DIAGNOSIS — M47816 Spondylosis without myelopathy or radiculopathy, lumbar region: Secondary | ICD-10-CM | POA: Diagnosis not present

## 2023-04-07 DIAGNOSIS — Z006 Encounter for examination for normal comparison and control in clinical research program: Secondary | ICD-10-CM

## 2023-04-07 DIAGNOSIS — G894 Chronic pain syndrome: Secondary | ICD-10-CM | POA: Diagnosis not present

## 2023-04-07 DIAGNOSIS — M47812 Spondylosis without myelopathy or radiculopathy, cervical region: Secondary | ICD-10-CM | POA: Diagnosis not present

## 2023-04-14 ENCOUNTER — Other Ambulatory Visit: Payer: Self-pay | Admitting: Nurse Practitioner

## 2023-04-14 DIAGNOSIS — K7469 Other cirrhosis of liver: Secondary | ICD-10-CM | POA: Diagnosis not present

## 2023-04-14 DIAGNOSIS — K754 Autoimmune hepatitis: Secondary | ICD-10-CM | POA: Diagnosis not present

## 2023-04-14 DIAGNOSIS — R252 Cramp and spasm: Secondary | ICD-10-CM | POA: Diagnosis not present

## 2023-05-03 LAB — GENECONNECT MOLECULAR SCREEN: Genetic Analysis Overall Interpretation: NEGATIVE

## 2023-05-06 DIAGNOSIS — G894 Chronic pain syndrome: Secondary | ICD-10-CM | POA: Diagnosis not present

## 2023-05-06 DIAGNOSIS — M47816 Spondylosis without myelopathy or radiculopathy, lumbar region: Secondary | ICD-10-CM | POA: Diagnosis not present

## 2023-05-06 DIAGNOSIS — M961 Postlaminectomy syndrome, not elsewhere classified: Secondary | ICD-10-CM | POA: Diagnosis not present

## 2023-05-06 DIAGNOSIS — M47812 Spondylosis without myelopathy or radiculopathy, cervical region: Secondary | ICD-10-CM | POA: Diagnosis not present

## 2023-05-10 NOTE — Progress Notes (Unsigned)
 Electrophysiology Office Note:   Date:  05/11/2023  ID:  Megan [name], DOB 03/25/1965, MRN [id number]  Primary Cardiologist: Ralene Burger, MD Primary Heart Failure: None Electrophysiologist: Joevon Holliman Cortland Ding, MD      History of Present Illness:   Megan [name] is a 58 y.o. 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   She had a cardioversion 12/01/2022.  Unfortunately she has gone back into atrial fibrillation.  Since last being seen in our clinic the patient reports increasing fatigue and shortness of breath.  She has no chest pain.  She has been feeling this way since being in atrial fibrillation.  She is quite frustrated with how she has been feeling on her for rhythm control.  She would like to avoid long-term antiarrhythmics.  she denies chest pain, palpitations, dyspnea, PND, orthopnea, nausea, vomiting, dizziness, syncope, edema, weight gain, or early satiety.   Review of systems complete and found to be negative unless listed in HPI.   EP Information / Studies Reviewed:    EKG is not ordered today. EKG from 02/13/23 reviewed which showed atrial fibrillation        Risk Assessment/Calculations:    CHA2DS2-VASc Score = 3   This indicates a 3.2% annual risk of stroke. The patient's score is based upon: CHF History: 1 HTN History: 1 Diabetes History: 0 Stroke History: 0 Vascular Disease History: 0 Age Score: 0 Gender Score: 1           Physical Exam:   VS:  BP 120/88 (BP Location: Right Arm, Patient Position: Sitting, Cuff Size: Large)   Pulse 86   Ht 5\' 7"  (1.702 m)   Wt 214 lb (97.1 kg)   LMP 04/10/2013 Comment: tubel ligation  SpO2 98%   BMI 33.52 kg/m    Wt Readings from Last 3 Encounters:  05/11/23 214 lb (97.1 kg)  03/25/23 215 lb (97.5 kg)  02/13/23 218 lb (98.9 kg)     GEN: Well  nourished, well developed in no acute distress NECK: No JVD; No carotid bruits CARDIAC: Regular rate and rhythm, no murmurs, rubs, gallops RESPIRATORY:  Clear to auscultation without rales, wheezing or rhonchi  ABDOMEN: Soft, non-tender, non-distended EXTREMITIES:  No edema; No deformity   ASSESSMENT AND PLAN:    1.  Persistent atrial fibrillation: She feels poorly in atrial fibrillation.  Due to her chronic liver disease, she would prefer to avoid long-term antiarrhythmic medications.  Due to that, we Megan [name] plan for ablation.  Risks and benefits have been discussed.  She understands the risks and is agreed to the procedure.  Risk, benefits, and alternatives to EP study and radiofrequency/pulse field ablation for afib were also discussed in detail today. These risks include but are not limited to stroke, bleeding, vascular damage, tamponade, perforation, damage to the esophagus, lungs, and other structures, pulmonary vein stenosis, worsening renal function, and death. The patient understands these risk and wishes to proceed.  We Megan [name] therefore proceed with catheter ablation at the next available time.  Carto, ICE, anesthesia are requested for the procedure.  Megan [name] also obtain CT PV protocol prior to the procedure to exclude LAA thrombus and further evaluate atrial anatomy.  2.  Secondary hypercoagulable state: On Eliquis  for atrial fibrillation  3.  Chronic systolic heart failure: Ejection fraction 40 to 45%.  Medical therapy per primary cardiology.  4.  Hypertension: Unclear diagnosis.  She is  not on any blood pressure medications.  Follow up with Afib Clinic as usual post procedure  Signed, Megan Sherburn Cortland Ding, MD

## 2023-05-11 ENCOUNTER — Encounter: Payer: Self-pay | Admitting: Cardiology

## 2023-05-11 ENCOUNTER — Ambulatory Visit: Payer: 59 | Attending: Cardiology | Admitting: Cardiology

## 2023-05-11 VITALS — BP 120/88 | HR 86 | Ht 67.0 in | Wt 214.0 lb

## 2023-05-11 DIAGNOSIS — D6869 Other thrombophilia: Secondary | ICD-10-CM | POA: Diagnosis not present

## 2023-05-11 DIAGNOSIS — I5022 Chronic systolic (congestive) heart failure: Secondary | ICD-10-CM

## 2023-05-11 DIAGNOSIS — Z01812 Encounter for preprocedural laboratory examination: Secondary | ICD-10-CM

## 2023-05-11 DIAGNOSIS — I4819 Other persistent atrial fibrillation: Secondary | ICD-10-CM | POA: Diagnosis not present

## 2023-05-11 NOTE — Patient Instructions (Signed)
 Medication Instructions:  Your physician recommends that you continue on your current medications as directed. Please refer to the Current Medication list given to you today.  *If you need a refill on your cardiac medications before your next appointment, please call your pharmacy*   Lab Work: Pre procedure labs -- we will call you to schedule:  BMP & CBC  If you have a lab test that is abnormal and we need to change your treatment, we will call you to review the results -- otherwise no news is good news.    Testing/Procedures: Your physician has requested that you have cardiac CT 1 month PRIOR to your ablation. Cardiac computed tomography (CT) is a painless test that uses an x-ray machine to take clear, detailed pictures of your heart. We will contact you if the result is abnormal. We will call you/schedule.  Your physician has recommended that you have an ablation. Catheter ablation is a medical procedure used to treat some cardiac arrhythmias (irregular heartbeats). During catheter ablation, a long, thin, flexible tube is put into a blood vessel in your groin (upper thigh), or neck. This tube is called an ablation catheter. It is then guided to your heart through the blood vessel. Radio frequency waves destroy small areas of heart tissue where abnormal heartbeats may cause an arrhythmia to start.   Your ablation is scheduled for 08/11/2023. Please arrive at [facility] at 8:00 am.  We will call/send instructions at a later date.   Follow-Up: At [facility], you and your health needs are our priority.  As part of our continuing mission to provide you with exceptional heart care, we have created designated Provider Care Teams.  These Care Teams include your primary Cardiologist (physician) and Advanced Practice Providers (APPs -  Physician Assistants and Nurse Practitioners) who all work together to provide you with the care you need, when you need it.  Your next appointment:   1  month(s) after your ablation  The format for your next appointment:   In Person  Provider:   AFib clinic   Thank you for choosing [facility]!!   Reece Cane, RN [phone]    Other Instructions   Cardiac Ablation Cardiac ablation is a procedure to destroy (ablate) some heart tissue that is sending bad signals. These bad signals cause problems in heart rhythm. The heart has many areas that make these signals. If there are problems in these areas, they can make the heart beat in a way that is not normal. Destroying some tissues can help make the heart rhythm normal. Tell your doctor about: Any allergies you have. All medicines you are taking. These include vitamins, herbs, eye drops, creams, and over-the-counter medicines. Any problems you or family members have had with medicines that make you fall asleep (anesthetics). Any blood disorders you have. Any surgeries you have had. Any medical conditions you have, such as kidney failure. Whether you are pregnant or may be pregnant. What are the risks? This is a safe procedure. But problems may occur, including: Infection. Bruising and bleeding. Bleeding into the chest. Stroke or blood clots. Damage to nearby areas of your body. Allergies to medicines or dyes. The need for a pacemaker if the normal system is damaged. Failure of the procedure to treat the problem. What happens before the procedure? Medicines Ask your doctor about: Changing or stopping your normal medicines. This is important. Taking aspirin and ibuprofen. Do not take these medicines unless your doctor tells you to take  them. Taking other medicines, vitamins, herbs, and supplements. General instructions Follow instructions from your doctor about what you cannot eat or drink. Plan to have someone take you home from the hospital or clinic. If you will be going home right after the procedure, plan to have someone with you for 24 hours. Ask your doctor  what steps will be taken to prevent infection. What happens during the procedure?  An IV tube will be put into one of your veins. You will be given a medicine to help you relax. The skin on your neck or groin will be numbed. A cut (incision) will be made in your neck or groin. A needle will be put through your cut and into a large vein. A tube (catheter) will be put into the needle. The tube will be moved to your heart. Dye may be put through the tube. This helps your doctor see your heart. Small devices (electrodes) on the tube will send out signals. A type of energy will be used to destroy some heart tissue. The tube will be taken out. Pressure will be held on your cut. This helps stop bleeding. A bandage will be put over your cut. The exact procedure may vary among doctors and hospitals. What happens after the procedure? You will be watched until you leave the hospital or clinic. This includes checking your heart rate, breathing rate, oxygen, and blood pressure. Your cut will be watched for bleeding. You will need to lie still for a few hours. Do not drive for 24 hours or as long as your doctor tells you. Summary Cardiac ablation is a procedure to destroy some heart tissue. This is done to treat heart rhythm problems. Tell your doctor about any medical conditions you may have. Tell him or her about all medicines you are taking to treat them. This is a safe procedure. But problems may occur. These include infection, bruising, bleeding, and damage to nearby areas of your body. Follow what your doctor tells you about food and drink. You may also be told to change or stop some of your medicines. After the procedure, do not drive for 24 hours or as long as your doctor tells you. This information is not intended to replace advice given to you by your health care provider. Make sure you discuss any questions you have with your health care provider. Document Revised: 03/22/2021 Document  Reviewed: 12/02/2018 Elsevier Patient Education  2023 [employer]   Cardiac Ablation, Care After  This sheet gives you information about how to care for yourself after your procedure. Your health care provider may also give you more specific instructions. If you have problems or questions, contact your health care provider. What can I expect after the procedure? After the procedure, it is common to have: Bruising around your puncture site. Tenderness around your puncture site. Skipped heartbeats. If you had an atrial fibrillation ablation, you may have atrial fibrillation during the first several months after your procedure.  Tiredness (fatigue).  Follow these instructions at home: Puncture site care  Follow instructions from your health care provider about how to take care of your puncture site. Make sure you: If present, leave stitches (sutures), skin glue, or adhesive strips in place. These skin closures may need to stay in place for up to 2 weeks. If adhesive strip edges start to loosen and curl up, you may trim the loose edges. Do not remove adhesive strips completely unless your health care provider tells you to do that. If  a large square bandage is present, this may be removed 24 hours after surgery.  Check your puncture site every day for signs of infection. Check for: Redness, swelling, or pain. Fluid or blood. If your puncture site starts to bleed, lie down on your back, apply firm pressure to the area, and contact your health care provider. Warmth. Pus or a bad smell. A pea or small marble sized lump at the site is normal and can take up to three months to resolve.  Driving Do not drive for at least 4 days after your procedure or however long your health care provider recommends. (Do not resume driving if you have previously been instructed not to drive for other health reasons.) Do not drive or use heavy machinery while taking prescription pain medicine. Activity Avoid  activities that take a lot of effort for at least 7 days after your procedure. Do not lift anything that is heavier than 5 lb (4.5 kg) for one week.  No sexual activity for 1 week.  Return to your normal activities as told by your health care provider. Ask your health care provider what activities are safe for you. General instructions Take over-the-counter and prescription medicines only as told by your health care provider. Do not use any products that contain nicotine or tobacco, such as cigarettes and e-cigarettes. If you need help quitting, ask your health care provider. You may shower after 24 hours, but Do not take baths, swim, or use a hot tub for 1 week.  Do not drink alcohol for 24 hours after your procedure. Keep all follow-up visits as told by your health care provider. This is important. Contact a health care provider if: You have redness, mild swelling, or pain around your puncture site. You have fluid or blood coming from your puncture site that stops after applying firm pressure to the area. Your puncture site feels warm to the touch. You have pus or a bad smell coming from your puncture site. You have a fever. You have chest pain or discomfort that spreads to your neck, jaw, or arm. You have chest pain that is worse with lying on your back or taking a deep breath. You are sweating a lot. You feel nauseous. You have a fast or irregular heartbeat. You have shortness of breath. You are dizzy or light-headed and feel the need to lie down. You have pain or numbness in the arm or leg closest to your puncture site. Get help right away if: Your puncture site suddenly swells. Your puncture site is bleeding and the bleeding does not stop after applying firm pressure to the area. These symptoms may represent a serious problem that is an emergency. Do not wait to see if the symptoms will go away. Get medical help right away. Call your local emergency services (911 in the [place] Do not  drive yourself to the hospital. Summary After the procedure, it is normal to have bruising and tenderness at the puncture site in your groin, neck, or forearm. Check your puncture site every day for signs of infection. Get help right away if your puncture site is bleeding and the bleeding does not stop after applying firm pressure to the area. This is a medical emergency. This information is not intended to replace advice given to you by your health care provider. Make sure you discuss any questions you have with your health care provider.

## 2023-05-21 ENCOUNTER — Ambulatory Visit
Admission: RE | Admit: 2023-05-21 | Discharge: 2023-05-21 | Disposition: A | Discharge status: Home / Self Care | Source: Ambulatory Visit | Attending: Nurse Practitioner | Admitting: Nurse Practitioner

## 2023-05-21 DIAGNOSIS — R7309 Other abnormal glucose: Secondary | ICD-10-CM | POA: Diagnosis not present

## 2023-05-21 DIAGNOSIS — H25812 Combined forms of age-related cataract, left eye: Secondary | ICD-10-CM | POA: Diagnosis not present

## 2023-05-21 DIAGNOSIS — H0011 Chalazion right upper eyelid: Secondary | ICD-10-CM | POA: Diagnosis not present

## 2023-05-21 DIAGNOSIS — H0100A Unspecified blepharitis right eye, upper and lower eyelids: Secondary | ICD-10-CM | POA: Diagnosis not present

## 2023-05-21 DIAGNOSIS — K7469 Other cirrhosis of liver: Secondary | ICD-10-CM

## 2023-05-21 DIAGNOSIS — H35362 Drusen (degenerative) of macula, left eye: Secondary | ICD-10-CM | POA: Diagnosis not present

## 2023-05-21 DIAGNOSIS — K7460 Unspecified cirrhosis of liver: Secondary | ICD-10-CM | POA: Diagnosis not present

## 2023-05-21 LAB — HM DIABETES EYE EXAM

## 2023-06-03 DIAGNOSIS — M47816 Spondylosis without myelopathy or radiculopathy, lumbar region: Secondary | ICD-10-CM | POA: Diagnosis not present

## 2023-06-03 DIAGNOSIS — M961 Postlaminectomy syndrome, not elsewhere classified: Secondary | ICD-10-CM | POA: Diagnosis not present

## 2023-06-03 DIAGNOSIS — G894 Chronic pain syndrome: Secondary | ICD-10-CM | POA: Diagnosis not present

## 2023-06-03 DIAGNOSIS — M47812 Spondylosis without myelopathy or radiculopathy, cervical region: Secondary | ICD-10-CM | POA: Diagnosis not present

## 2023-06-04 ENCOUNTER — Encounter: Payer: Self-pay | Admitting: Family

## 2023-06-04 DIAGNOSIS — J329 Chronic sinusitis, unspecified: Secondary | ICD-10-CM

## 2023-06-05 MED ORDER — AZITHROMYCIN 250 MG PO TABS: ORAL_TABLET | ORAL | 0 refills | Status: AC

## 2023-06-05 NOTE — Telephone Encounter (Signed)
 Please see the MyChart message reply(ies) for my assessment and plan.  The patient gave consent for this Medical Advice Message and is aware that it may result in a bill to their insurance company as well as the possibility that this may result in a co-payment or deductible. They are an established patient, but are not seeking medical advice exclusively about a problem treated during an in person or video visit in the last 7 days. I did not recommend an in person or video visit within 7 days of my reply.  I spent a total of 5 minutes cumulative time within 7 days through [employer] [name], NP

## 2023-06-12 ENCOUNTER — Encounter: Payer: Self-pay | Admitting: Cardiology

## 2023-06-12 ENCOUNTER — Encounter: Payer: Self-pay | Admitting: Family Medicine

## 2023-06-12 ENCOUNTER — Ambulatory Visit (INDEPENDENT_AMBULATORY_CARE_PROVIDER_SITE_OTHER): Admitting: Family Medicine

## 2023-06-12 VITALS — BP 124/80 | HR 103 | Temp 98.0°F | Resp 16 | Ht 67.0 in | Wt 206.2 lb

## 2023-06-12 DIAGNOSIS — R509 Fever, unspecified: Secondary | ICD-10-CM | POA: Diagnosis not present

## 2023-06-12 MED ORDER — DOXYCYCLINE HYCLATE 100 MG PO TABS: 100.0000 mg | ORAL_TABLET | Freq: Two times a day (BID) | ORAL | 0 refills | Status: AC

## 2023-06-12 NOTE — Patient Instructions (Addendum)
 Give us  5-6 business days to get the results of your labs back.   Continue to push fluids, practice good hand hygiene, and cover your mouth if you cough.  If you start having fevers, shaking or shortness of breath, seek immediate care.  Let us  know if you need anything.
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
## 2023-06-15 ENCOUNTER — Other Ambulatory Visit: Payer: Self-pay

## 2023-06-15 ENCOUNTER — Ambulatory Visit: Payer: Self-pay | Admitting: Family Medicine

## 2023-06-15 ENCOUNTER — Encounter: Payer: Self-pay | Admitting: *Deleted

## 2023-06-15 DIAGNOSIS — I4819 Other persistent atrial fibrillation: Secondary | ICD-10-CM

## 2023-06-15 DIAGNOSIS — Z01812 Encounter for preprocedural laboratory examination: Secondary | ICD-10-CM

## 2023-06-15 LAB — PLASMODIUM SP. PCR: Plasmodium Sp. PCR: NEGATIVE

## 2023-06-16 LAB — QUANTIFERON-TB GOLD PLUS
Mitogen-NIL: 2.71 [IU]/mL
NIL: 0.03 [IU]/mL
QuantiFERON-TB Gold Plus: NEGATIVE
TB1-NIL: 0 [IU]/mL
TB2-NIL: 0.01 [IU]/mL

## 2023-06-16 LAB — MALARIA SMEAR

## 2023-07-02 DIAGNOSIS — M47812 Spondylosis without myelopathy or radiculopathy, cervical region: Secondary | ICD-10-CM | POA: Diagnosis not present

## 2023-07-02 DIAGNOSIS — G894 Chronic pain syndrome: Secondary | ICD-10-CM | POA: Diagnosis not present

## 2023-07-02 DIAGNOSIS — M47816 Spondylosis without myelopathy or radiculopathy, lumbar region: Secondary | ICD-10-CM | POA: Diagnosis not present

## 2023-07-02 DIAGNOSIS — M961 Postlaminectomy syndrome, not elsewhere classified: Secondary | ICD-10-CM | POA: Diagnosis not present

## 2023-07-13 DIAGNOSIS — H0100B Unspecified blepharitis left eye, upper and lower eyelids: Secondary | ICD-10-CM | POA: Diagnosis not present

## 2023-07-13 DIAGNOSIS — B880 Other acariasis: Secondary | ICD-10-CM | POA: Diagnosis not present

## 2023-07-13 DIAGNOSIS — H0100A Unspecified blepharitis right eye, upper and lower eyelids: Secondary | ICD-10-CM | POA: Diagnosis not present

## 2023-07-21 ENCOUNTER — Ambulatory Visit (HOSPITAL_COMMUNITY)
Admission: RE | Admit: 2023-07-21 | Discharge: 2023-07-21 | Disposition: A | Discharge status: Home / Self Care | Source: Ambulatory Visit | Attending: Cardiology

## 2023-07-21 ENCOUNTER — Encounter: Payer: Self-pay | Admitting: Cardiology

## 2023-07-21 DIAGNOSIS — I4819 Other persistent atrial fibrillation: Secondary | ICD-10-CM

## 2023-07-21 DIAGNOSIS — Z01812 Encounter for preprocedural laboratory examination: Secondary | ICD-10-CM | POA: Diagnosis not present

## 2023-07-21 LAB — CBC

## 2023-07-21 MED ORDER — IOHEXOL 350 MG/ML SOLN
100.0000 mL | Freq: Once | INTRAVENOUS | Status: AC | PRN
  Administered 2023-07-21: 100 mL via INTRAVENOUS

## 2023-07-22 ENCOUNTER — Ambulatory Visit: Payer: Self-pay

## 2023-07-22 LAB — BASIC METABOLIC PANEL WITH GFR
BUN/Creatinine Ratio: 42 — ABNORMAL HIGH (ref 12–28)
BUN: 23 mg/dL (ref 8–27)
CO2: 24 mmol/L (ref 20–29)
Calcium: 8.6 mg/dL — ABNORMAL LOW (ref 8.7–10.3)
Chloride: 102 mmol/L (ref 96–106)
Creatinine, Ser: 0.55 mg/dL — ABNORMAL LOW (ref 0.57–1.00)
Glucose: 79 mg/dL (ref 70–99)
Potassium: 4.5 mmol/L (ref 3.5–5.2)
Sodium: 139 mmol/L (ref 134–144)
eGFR: 105 mL/min/1.73 (ref >59)

## 2023-07-22 LAB — CBC
Hematocrit: 38.2 (ref 34.0–46.6)
Hemoglobin: 12.7 g/dL (ref 11.1–15.9)
MCH: 30 pg (ref 26.6–33.0)
MCHC: 33.2 g/dL (ref 31.5–35.7)
MCV: 90 fL (ref 79–97)
Platelets: 120 x10E3/uL — AB (ref 150–450)
RBC: 4.23 x10E6/uL (ref 3.77–5.28)
RDW: 13.6 (ref 11.7–15.4)
WBC: 3.3 x10E3/uL — AB (ref 3.4–10.8)

## 2023-07-28 DIAGNOSIS — M47812 Spondylosis without myelopathy or radiculopathy, cervical region: Secondary | ICD-10-CM | POA: Diagnosis not present

## 2023-07-28 DIAGNOSIS — M961 Postlaminectomy syndrome, not elsewhere classified: Secondary | ICD-10-CM | POA: Diagnosis not present

## 2023-07-28 DIAGNOSIS — G894 Chronic pain syndrome: Secondary | ICD-10-CM | POA: Diagnosis not present

## 2023-07-28 DIAGNOSIS — M47816 Spondylosis without myelopathy or radiculopathy, lumbar region: Secondary | ICD-10-CM | POA: Diagnosis not present

## 2023-08-03 ENCOUNTER — Telehealth: Payer: Self-pay | Admitting: Cardiology

## 2023-08-03 NOTE — Telephone Encounter (Signed)
 Patient's is requesting to speak with a nurse. Patient has an upcoming procedure on 08/11/23 and would like to know which medications she can or cannot take. Please advise.

## 2023-08-04 ENCOUNTER — Telehealth (HOSPITAL_COMMUNITY): Payer: Self-pay

## 2023-08-04 NOTE — Telephone Encounter (Signed)
 Patient returned call to discuss upcoming procedure.   CT: completed.  Labs: completed.   Any recent signs of acute illness or been started on antibiotics? No Any new medications started? No Any medications to hold? No Any missed doses of blood thinner? No Advised patient to continue taking ANTICOAGULANT: Eliquis  (Apixaban ) twice daily without missing any doses.  Medication instructions:  On the morning of your procedure DO NOT take any medication., including Eliquis  or the procedure may be rescheduled. Nothing to eat or drink after midnight prior to your procedure.  Confirmed patient is scheduled for Atrial Fibrillation Ablation on Tuesday, July 29 with Dr. Soyla Norton. Instructed patient to arrive at the Main Entrance A at [facility]: [address] [place], [place] [address] and check in at Admitting at 8:00 AM.  Patient is scheduled to stay overnight and confirmed that she has someone to take her home the following day.  Patient had concerns on if she needed to bring any medications from home. Reviewed medication list and advised there should be no reason to bring any of her oral medications because the pharmacy should have available but she can bring her eye drops, just in case. Confirmed this information with [facility] pharmacist, Zander.  Patient verbalized understanding to all instructions provided and agreed to proceed with procedure.

## 2023-08-04 NOTE — Telephone Encounter (Signed)
 Attempted to reach patient to discuss upcoming procedure. Patient requested to call me back. Contact information given.

## 2023-08-05 ENCOUNTER — Encounter: Payer: Self-pay | Admitting: Cardiology

## 2023-08-10 NOTE — Pre-Procedure Instructions (Signed)
 Attempted to call patient regarding procedure instructions.  Left voicemail on the following items: Arrival time 0800 Nothing to eat or drink after midnight No meds AM of procedure Responsible person to drive you home and stay with you for 24 hrs if you go home.  Have you missed any doses of anti-coagulant Eliquis - should be taken it twice a day, if you have missed any doses please let us  know.  Don't take dose morning of procedure.

## 2023-08-11 ENCOUNTER — Other Ambulatory Visit: Payer: Self-pay

## 2023-08-11 ENCOUNTER — Ambulatory Visit (HOSPITAL_COMMUNITY)

## 2023-08-11 ENCOUNTER — Encounter (HOSPITAL_COMMUNITY): Payer: Self-pay | Admitting: Cardiology

## 2023-08-11 ENCOUNTER — Ambulatory Visit (HOSPITAL_COMMUNITY): Admitting: Certified Registered Nurse Anesthetist

## 2023-08-11 ENCOUNTER — Ambulatory Visit (HOSPITAL_COMMUNITY)
Admission: RE | Disposition: A | Discharge status: Home / Self Care | Payer: Self-pay | Source: Ambulatory Visit | Attending: Cardiology

## 2023-08-11 ENCOUNTER — Ambulatory Visit (HOSPITAL_COMMUNITY)
Admission: RE | Admit: 2023-08-11 | Discharge: 2023-08-12 | Disposition: A | Discharge status: Home / Self Care | Source: Ambulatory Visit | Attending: Cardiology | Admitting: Cardiology

## 2023-08-11 DIAGNOSIS — I4891 Unspecified atrial fibrillation: Secondary | ICD-10-CM

## 2023-08-11 DIAGNOSIS — I700 Atherosclerosis of aorta: Secondary | ICD-10-CM | POA: Diagnosis not present

## 2023-08-11 DIAGNOSIS — I6389 Other cerebral infarction: Secondary | ICD-10-CM | POA: Diagnosis not present

## 2023-08-11 DIAGNOSIS — Z79899 Other long term (current) drug therapy: Secondary | ICD-10-CM | POA: Insufficient documentation

## 2023-08-11 DIAGNOSIS — I509 Heart failure, unspecified: Secondary | ICD-10-CM

## 2023-08-11 DIAGNOSIS — Z6831 Body mass index (BMI) 31.0-31.9, adult: Secondary | ICD-10-CM | POA: Insufficient documentation

## 2023-08-11 DIAGNOSIS — K118 Other diseases of salivary glands: Secondary | ICD-10-CM | POA: Insufficient documentation

## 2023-08-11 DIAGNOSIS — R2681 Unsteadiness on feet: Secondary | ICD-10-CM | POA: Diagnosis not present

## 2023-08-11 DIAGNOSIS — R29702 NIHSS score 2: Secondary | ICD-10-CM | POA: Diagnosis not present

## 2023-08-11 DIAGNOSIS — K754 Autoimmune hepatitis: Secondary | ICD-10-CM | POA: Diagnosis not present

## 2023-08-11 DIAGNOSIS — I110 Hypertensive heart disease with heart failure: Secondary | ICD-10-CM | POA: Insufficient documentation

## 2023-08-11 DIAGNOSIS — G459 Transient cerebral ischemic attack, unspecified: Secondary | ICD-10-CM | POA: Diagnosis not present

## 2023-08-11 DIAGNOSIS — I771 Stricture of artery: Secondary | ICD-10-CM | POA: Diagnosis not present

## 2023-08-11 DIAGNOSIS — E063 Autoimmune thyroiditis: Secondary | ICD-10-CM | POA: Insufficient documentation

## 2023-08-11 DIAGNOSIS — Z87891 Personal history of nicotine dependence: Secondary | ICD-10-CM

## 2023-08-11 DIAGNOSIS — K7460 Unspecified cirrhosis of liver: Secondary | ICD-10-CM | POA: Diagnosis not present

## 2023-08-11 DIAGNOSIS — I4819 Other persistent atrial fibrillation: Secondary | ICD-10-CM | POA: Diagnosis not present

## 2023-08-11 DIAGNOSIS — K760 Fatty (change of) liver, not elsewhere classified: Secondary | ICD-10-CM | POA: Insufficient documentation

## 2023-08-11 DIAGNOSIS — E669 Obesity, unspecified: Secondary | ICD-10-CM | POA: Insufficient documentation

## 2023-08-11 DIAGNOSIS — R29818 Other symptoms and signs involving the nervous system: Secondary | ICD-10-CM | POA: Diagnosis not present

## 2023-08-11 HISTORY — PX: ATRIAL FIBRILLATION ABLATION: EP1191

## 2023-08-11 LAB — HEMOGLOBIN A1C
Hgb A1c MFr Bld: 4.6 % — ABNORMAL LOW (ref 4.8–5.6)
Mean Plasma Glucose: 85.32 mg/dL

## 2023-08-11 LAB — GLUCOSE, CAPILLARY: Glucose-Capillary: 100 mg/dL — ABNORMAL HIGH (ref 70–99)

## 2023-08-11 LAB — POCT ACTIVATED CLOTTING TIME: Activated Clotting Time: 268 s

## 2023-08-11 MED ORDER — STROKE: EARLY STAGES OF RECOVERY BOOK
Freq: Once | Status: DC
  Filled 2023-08-11: qty 1

## 2023-08-11 MED ORDER — PROPOFOL 500 MG/50ML IV EMUL
INTRAVENOUS | Status: DC | PRN
  Administered 2023-08-11: 150 ug/kg/min via INTRAVENOUS

## 2023-08-11 MED ORDER — ATROPINE SULFATE 1 MG/10ML IJ SOSY
PREFILLED_SYRINGE | INTRAMUSCULAR | Status: DC | PRN
  Administered 2023-08-11: 1 mg via INTRAVENOUS

## 2023-08-11 MED ORDER — ACETAMINOPHEN 325 MG PO TABS
ORAL_TABLET | ORAL | Status: AC
  Filled 2023-08-11: qty 2

## 2023-08-11 MED ORDER — SODIUM CHLORIDE 0.9 % IV SOLN: 250.0000 mL | INTRAVENOUS | Status: DC | PRN

## 2023-08-11 MED ORDER — PHENYLEPHRINE 80 MCG/ML (10ML) SYRINGE FOR IV PUSH (FOR BLOOD PRESSURE SUPPORT)
PREFILLED_SYRINGE | INTRAVENOUS | Status: DC | PRN
  Administered 2023-08-11: 160 ug via INTRAVENOUS

## 2023-08-11 MED ORDER — ACETAMINOPHEN 325 MG PO TABS
650.0000 mg | ORAL_TABLET | Freq: Four times a day (QID) | ORAL | Status: DC | PRN
  Administered 2023-08-11 – 2023-08-12 (×3): 650 mg via ORAL
  Filled 2023-08-11 (×3): qty 2

## 2023-08-11 MED ORDER — LEVOTHYROXINE SODIUM 137 MCG PO TABS
137.0000 ug | ORAL_TABLET | Freq: Every day | ORAL | Status: DC
  Administered 2023-08-12: 137 ug via ORAL
  Filled 2023-08-11: qty 1

## 2023-08-11 MED ORDER — PROPOFOL 10 MG/ML IV BOLUS
INTRAVENOUS | Status: DC | PRN
Start: 2023-08-11 — End: 2023-08-11
  Administered 2023-08-11: 140 mg via INTRAVENOUS

## 2023-08-11 MED ORDER — COLCHICINE 0.6 MG PO TABS
0.6000 mg | ORAL_TABLET | Freq: Once | ORAL | Status: DC
  Filled 2023-08-11: qty 1

## 2023-08-11 MED ORDER — OXYCODONE HCL 5 MG PO TABS
5.0000 mg | ORAL_TABLET | Freq: Four times a day (QID) | ORAL | Status: DC | PRN
  Administered 2023-08-12 (×2): 5 mg via ORAL
  Filled 2023-08-11 (×2): qty 1

## 2023-08-11 MED ORDER — KETOROLAC TROMETHAMINE 15 MG/ML IJ SOLN
15.0000 mg | Freq: Once | INTRAMUSCULAR | Status: AC
  Administered 2023-08-11: 15 mg via INTRAVENOUS
  Filled 2023-08-11: qty 1

## 2023-08-11 MED ORDER — OXYCODONE HCL 5 MG PO TABS
ORAL_TABLET | ORAL | Status: AC
  Filled 2023-08-11: qty 1

## 2023-08-11 MED ORDER — POLYETHYLENE GLYCOL 3350 17 G PO PACK
17.0000 g | PACK | Freq: Every day | ORAL | Status: DC
  Administered 2023-08-11: 17 g via ORAL
  Filled 2023-08-11 (×2): qty 1

## 2023-08-11 MED ORDER — HEPARIN SODIUM (PORCINE) 1000 UNIT/ML IJ SOLN
INTRAMUSCULAR | Status: DC | PRN
  Administered 2023-08-11: 14000 [IU] via INTRAVENOUS
  Administered 2023-08-11: 7000 [IU] via INTRAVENOUS

## 2023-08-11 MED ORDER — HEPARIN (PORCINE) IN NACL 1000-0.9 UT/500ML-% IV SOLN
INTRAVENOUS | Status: DC | PRN
  Administered 2023-08-11 (×3): 500 mL

## 2023-08-11 MED ORDER — ROCURONIUM BROMIDE 10 MG/ML (PF) SYRINGE
PREFILLED_SYRINGE | INTRAVENOUS | Status: DC | PRN
  Administered 2023-08-11: 50 mg via INTRAVENOUS
  Administered 2023-08-11: 20 mg via INTRAVENOUS
  Administered 2023-08-11: 30 mg via INTRAVENOUS

## 2023-08-11 MED ORDER — PHENYLEPHRINE HCL-NACL 20-0.9 MG/250ML-% IV SOLN
INTRAVENOUS | Status: DC | PRN
  Administered 2023-08-11: 35 ug/min via INTRAVENOUS

## 2023-08-11 MED ORDER — APIXABAN 5 MG PO TABS
5.0000 mg | ORAL_TABLET | Freq: Two times a day (BID) | ORAL | Status: DC
  Administered 2023-08-11 – 2023-08-12 (×2): 5 mg via ORAL
  Filled 2023-08-11 (×3): qty 1

## 2023-08-11 MED ORDER — SODIUM CHLORIDE 0.9% FLUSH
3.0000 mL | Freq: Two times a day (BID) | INTRAVENOUS | Status: DC
  Administered 2023-08-11 (×2): 3 mL via INTRAVENOUS

## 2023-08-11 MED ORDER — IOHEXOL 350 MG/ML SOLN
75.0000 mL | Freq: Once | INTRAVENOUS | Status: AC | PRN
  Administered 2023-08-11: 75 mL via INTRAVENOUS

## 2023-08-11 MED ORDER — FENTANYL CITRATE (PF) 250 MCG/5ML IJ SOLN
INTRAMUSCULAR | Status: DC | PRN
  Administered 2023-08-11 (×2): 50 ug via INTRAVENOUS

## 2023-08-11 MED ORDER — ONDANSETRON HCL 4 MG/2ML IJ SOLN
INTRAMUSCULAR | Status: DC | PRN
  Administered 2023-08-11: 4 mg via INTRAVENOUS

## 2023-08-11 MED ORDER — NEOMYCIN-POLYMYXIN-DEXAMETH 3.5-10000-0.1 OP OINT
1.0000 | TOPICAL_OINTMENT | Freq: Every day | OPHTHALMIC | Status: DC
  Filled 2023-08-11: qty 3.5

## 2023-08-11 MED ORDER — SODIUM CHLORIDE 0.9% FLUSH: 3.0000 mL | INTRAVENOUS | Status: DC | PRN

## 2023-08-11 MED ORDER — AZATHIOPRINE 50 MG PO TABS
50.0000 mg | ORAL_TABLET | Freq: Every day | ORAL | Status: DC
Start: 2023-08-11 — End: 2023-08-12
  Administered 2023-08-11 – 2023-08-12 (×2): 50 mg via ORAL
  Filled 2023-08-11 (×2): qty 1

## 2023-08-11 MED ORDER — SUGAMMADEX SODIUM 200 MG/2ML IV SOLN
INTRAVENOUS | Status: DC | PRN
  Administered 2023-08-11: 200 mg via INTRAVENOUS

## 2023-08-11 MED ORDER — ONDANSETRON HCL 4 MG/2ML IJ SOLN: 4.0000 mg | Freq: Four times a day (QID) | INTRAMUSCULAR | Status: DC | PRN

## 2023-08-11 MED ORDER — SODIUM CHLORIDE 0.9 % IV SOLN: INTRAVENOUS | Status: DC

## 2023-08-11 MED ORDER — FENTANYL CITRATE (PF) 100 MCG/2ML IJ SOLN
INTRAMUSCULAR | Status: AC
  Filled 2023-08-11: qty 2

## 2023-08-11 MED ORDER — LACTATED RINGERS IV SOLN: INTRAVENOUS | Status: AC

## 2023-08-11 MED ORDER — BACLOFEN 5 MG HALF TABLET
5.0000 mg | ORAL_TABLET | Freq: Every day | ORAL | Status: DC
  Administered 2023-08-11: 5 mg via ORAL
  Filled 2023-08-11: qty 1

## 2023-08-11 MED ORDER — OXYCODONE HCL 5 MG PO TABS
5.0000 mg | ORAL_TABLET | Freq: Four times a day (QID) | ORAL | Status: DC
  Administered 2023-08-11: 5 mg via ORAL

## 2023-08-11 MED ORDER — DEXAMETHASONE SODIUM PHOSPHATE 10 MG/ML IJ SOLN
INTRAMUSCULAR | Status: DC | PRN
  Administered 2023-08-11: 10 mg via INTRAVENOUS
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
## 2023-08-11 NOTE — H&P (Signed)
  Electrophysiology Office Note:   Date:  08/11/2023  ID:  Megan [name], DOB 1965/11/28, MRN [id number]  Primary Cardiologist: Lamar Fitch, MD Primary Heart Failure: None Electrophysiologist: Cyani Kallstrom [name], MD      History of Present Illness:   Megan [name] is a 58 y.o. 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   She had a cardioversion 12/01/2022.  Unfortunately she has gone back into atrial fibrillation.  Today, denies symptoms of palpitations, chest pain, dyspnea, orthopnea, PND, lower extremity edema, claudication, dizziness, presyncope, syncope, bleeding, or neurologic sequela. The patient is tolerating medications without difficulties. Plan ablation today.   EP Information / Studies Reviewed:    EKG is not ordered today. EKG from 02/13/23 reviewed which showed atrial fibrillation        Risk Assessment/Calculations:    CHA2DS2-VASc Score = 3   This indicates a 3.2% annual risk of stroke. The patient's score is based upon: CHF History: 1 HTN History: 1 Diabetes History: 0 Stroke History: 0 Vascular Disease History: 0 Age Score: 0 Gender Score: 1           Physical Exam:   VS:  BP 111/88   Pulse 68   Temp 98 F (36.7 C) (Oral)   Resp 16   Ht 5' 7 (1.702 m)   Wt 90.7 kg   LMP 04/10/2013 Comment: tubel ligation  SpO2 97%   BMI 31.32 kg/m    Wt Readings from Last 3 Encounters:  08/11/23 90.7 kg  06/12/23 93.5 kg  05/11/23 97.1 kg    GEN: No acute distress.   Neck: No JVD Cardiac: RRR, no murmurs, rubs, or gallops.  Respiratory: normal BS bilaterally. GI: Soft, nontender, non-distended  MS: No edema; No deformity. Neuro:  Nonfocal  Skin: warm and dry Psych: Normal affect    ASSESSMENT AND PLAN:    1.  Persistent atrial fibrillation: Megan [name] has presented today for  surgery, with the diagnosis of AF.  The various methods of treatment have been discussed with the patient and family. After consideration of risks, benefits and other options for treatment, the patient has consented to  Procedure(s): Catheter ablation as a surgical intervention .  Risks include but not limited to complete heart block, stroke, esophageal damage, nerve damage, bleeding, vascular damage, tamponade, perforation, MI, and death. The patient's history has been reviewed, patient examined, no change in status, stable for surgery.  I have reviewed the patient's chart and labs.  Questions were answered to the patient's satisfaction.    Megan Rothlisberger Norton, MD 08/11/2023 8:48 AM

## 2023-08-11 NOTE — Evaluation (Signed)
 Physical Therapy Evaluation Patient Details Name: Megan [name] MRN: [id number] DOB: June 18, 1965 Today's Date: 08/11/2023  History of Present Illness  Pt is a 58 y.o. F who presents 08/11/2023 with catheter ablation for atrial fibrillation. Post op, left hand numbness and grip weakness noted. Code stroke was called. CTH negative for bleed. 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  Clinical Impression  PTA, pt lives with her 5 and 102 y.o. children and works as a Runner, broadcasting/film/video for [employer]. Pt reports altered sensation and temperature in left fingertips, although she is intact to light touch. Pt with slowed and slightly impaired coordination with LUE. Pt reports headache and mild dizziness. BP pre mobility 100/72 (81), post mobility 99/74 (82) - pt reports this is low for her. Pt ambulating 200 ft with CGA; needs single UE support on rail for balance. Suspect this may hopefully resolve by tomorrow, but will follow up.       If plan is discharge home, recommend the following: Assistance with cooking/housework;Assist for transportation   Can travel by private Interior and spatial designer (vs none pending progress)  Recommendations for Other Services       Functional Status Assessment Patient has had a recent decline in their functional status and demonstrates the ability to make significant improvements in function in a reasonable and predictable amount of time.     Precautions / Restrictions Precautions Precautions: Fall Restrictions Weight Bearing Restrictions Per Provider Order: No      Mobility  Bed Mobility Overal bed mobility: Independent                  Transfers Overall transfer level: Independent Equipment used: None                    Ambulation/Gait Ambulation/Gait assistance: Contact guard assist Gait Distance (Feet): 200  Feet Assistive device: None Gait Pattern/deviations: Step-through pattern, Decreased stride length, Narrow base of support Gait velocity: decreased     General Gait Details: Unsteadiness without external support  Stairs            Wheelchair Mobility     Tilt Bed    Modified Rankin (Stroke Patients Only) Modified Rankin (Stroke Patients Only) Pre-Morbid Rankin Score: No symptoms Modified Rankin: Moderately severe disability     Balance Overall balance assessment: Mild deficits observed, not formally tested                                           Pertinent Vitals/Pain Pain Assessment Pain Assessment: Faces Faces Pain Scale: Hurts little more Pain Location: headache Pain Descriptors / Indicators: Headache Pain Intervention(s): Monitored during session    Home Living Family/patient expects to be discharged to:: Private residence Living Arrangements: Children (5 and 10 y.o.) Available Help at Discharge: Other (Comment) (pt reports no family alive) Type of Home: House (duplex) Home Access: Level entry       Home Layout: One level Home Equipment: Cane - quad      Prior Function Prior Level of Function : Independent/Modified Independent             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[employer], 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       Extremity/Trunk Assessment   Upper Extremity Assessment Upper Extremity Assessment: Right hand dominant;LUE deficits/detail  LUE Deficits / Details: Slower finger to nose LUE Sensation: WNL LUE Coordination: decreased fine motor;decreased gross motor    Lower Extremity Assessment Lower Extremity Assessment: Overall WFL for tasks assessed    Cervical / Trunk Assessment Cervical / Trunk Assessment: Normal  Communication   Communication Communication: No apparent difficulties    Cognition Arousal: Alert Behavior During Therapy: WFL for tasks assessed/performed   PT - Cognitive impairments: No apparent  impairments                         Following commands: Intact       Cueing Cueing Techniques: Verbal cues     General Comments      Exercises     Assessment/Plan    PT Assessment Patient needs continued PT services  PT Problem List Decreased balance;Decreased mobility       PT Treatment Interventions Functional mobility training;Therapeutic activities;DME instruction;Therapeutic exercise;Gait training;Balance training;Patient/family education    PT Goals (Current goals can be found in the Care Plan section)  Acute Rehab PT Goals Patient Stated Goal: back to baseline PT Goal Formulation: With patient Time For Goal Achievement: 08/25/23 Potential to Achieve Goals: Good    Frequency Min 2X/week     Co-evaluation               AM-PAC PT 6 Clicks Mobility  Outcome Measure Help needed turning from your back to your side while in a flat bed without using bedrails?: None Help needed moving from lying on your back to sitting on the side of a flat bed without using bedrails?: None Help needed moving to and from a bed to a chair (including a wheelchair)?: None Help needed standing up from a chair using your arms (e.g., wheelchair or bedside chair)?: None Help needed to walk in hospital room?: A Little Help needed climbing 3-5 steps with a railing? : A Little 6 Click Score: 22    End of Session Equipment Utilized During Treatment: Gait belt Activity Tolerance: Patient tolerated treatment well Patient left: in chair;with call bell/phone within reach Nurse Communication: Mobility status PT Visit Diagnosis: Unsteadiness on feet (R26.81)    Time: 8469-8447 PT Time Calculation (min) (ACUTE ONLY): 22 min   Charges:   PT Evaluation $PT Eval Low Complexity: 1 Low   PT General Charges $$ ACUTE PT VISIT: 1 Visit         Aleck Daring, PT, DPT Acute Rehabilitation Services Office [phone]   [name] 08/11/2023, 4:40 PM

## 2023-08-11 NOTE — Anesthesia Postprocedure Evaluation (Signed)
 Anesthesia Post Note  Patient: Megan [name]  Procedure(s) Performed: ATRIAL FIBRILLATION ABLATION     Patient location during evaluation: PACU Anesthesia Type: General Level of consciousness: awake and alert Pain management: pain level controlled Vital Signs Assessment: post-procedure vital signs reviewed and stable Respiratory status: spontaneous breathing, nonlabored ventilation, respiratory function stable and patient connected to nasal cannula oxygen Cardiovascular status: blood pressure returned to baseline and stable Postop Assessment: no apparent nausea or vomiting Anesthetic complications: no   There were no known notable events for this encounter.  Last Vitals:  Vitals:   08/11/23 1136 08/11/23 1145  BP: 102/77 115/77  Pulse: 60 61  Resp: 15 12  Temp:    SpO2: 100% 96%    Last Pain:  Vitals:   08/11/23 1125  TempSrc:   PainSc: 7                  Rome Ade

## 2023-08-11 NOTE — Progress Notes (Signed)
 SLP Cancellation Note  Patient Details Name: CHARLI HALLE MRN: [id number] DOB: 31-Jan-1965   Cancelled treatment:       Reason Eval/Treat Not Completed: SLP screened. CTH negative and PT denies acute SLP needs. Will sign off but please re-consult if new needs arise.    Damien Blumenthal, M.A., CCC-SLP Speech Language Pathology, Acute Rehabilitation Services  Secure Chat preferred [phone]  08/11/2023, 4:05 PM

## 2023-08-11 NOTE — Anesthesia Procedure Notes (Signed)
 Procedure Name: Intubation Date/Time: 08/11/2023 9:30 AM  Performed by: Zelphia Norleen HERO, CRNAPre-anesthesia Checklist: Patient identified, Emergency Drugs available, Suction available and Patient being monitored Patient Re-evaluated:Patient Re-evaluated prior to induction Oxygen Delivery Method: Circle system utilized Preoxygenation: Pre-oxygenation with 100% oxygen Induction Type: IV induction Ventilation: Mask ventilation without difficulty Laryngoscope Size: Glidescope and 3 Grade View: Grade I Tube type: Oral Tube size: 7.0 mm Number of attempts: 1 Airway Equipment and Method: Stylet and Oral airway Placement Confirmation: ETT inserted through vocal cords under direct vision, positive ETCO2 and breath sounds checked- equal and bilateral Secured at: 21 cm Tube secured with: Tape Dental Injury: Teeth and Oropharynx as per pre-operative assessment

## 2023-08-11 NOTE — Code Documentation (Signed)
 Megan [name] is a 58 yr old female with H/O AF, cirrhosis, s/p cardiac ablation. She was in post op area when she began to feel numb in left arm. Code Stroke activated by Cath Lab staff. She did receive Heparin  intra procedurally.    Stroke team to bedside upon activation. CBG, BP WNL. NIHSS 1, Pt with sensory loss in left arm. The following imaging was obtained: CT, CTA. Per Dr. Vanessa, CT was negative for hemorrhage, and CTA was negative for LVO. Pt returned to Cath Lab recovery area. Bedside handoff with RN Keene complete.  Care Plan: NIHSS and BP q 2 x 12, then q 4.  NPO until swallow evaluation  Richardson Said RN Stroke Response [phone]

## 2023-08-11 NOTE — Transfer of Care (Signed)
 Immediate Anesthesia Transfer of Care Note  Patient: Megan [name]  Procedure(s) Performed: ATRIAL FIBRILLATION ABLATION  Patient Location: PACU  Anesthesia Type:General  Level of Consciousness: awake  Airway & Oxygen Therapy: Patient Spontanous Breathing and Patient connected to nasal cannula oxygen  Post-op Assessment: Report given to RN and Post -op Vital signs reviewed and stable  Post vital signs: Reviewed and stable  Last Vitals:  Vitals Value Taken Time  BP 107/70 08/11/23 10:55  Temp    Pulse 57 08/11/23 10:58  Resp 14 08/11/23 10:58  SpO2 100 % 08/11/23 10:58  Vitals shown include unfiled device data.  Last Pain:  Vitals:   08/11/23 0759  TempSrc: Oral         Complications: There were no known notable events for this encounter.
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
## 2023-08-12 ENCOUNTER — Encounter (HOSPITAL_COMMUNITY): Payer: Self-pay | Admitting: Cardiology

## 2023-08-12 ENCOUNTER — Ambulatory Visit (HOSPITAL_COMMUNITY)

## 2023-08-12 DIAGNOSIS — Z7901 Long term (current) use of anticoagulants: Secondary | ICD-10-CM | POA: Diagnosis not present

## 2023-08-12 DIAGNOSIS — K754 Autoimmune hepatitis: Secondary | ICD-10-CM | POA: Diagnosis not present

## 2023-08-12 DIAGNOSIS — Z8679 Personal history of other diseases of the circulatory system: Secondary | ICD-10-CM | POA: Diagnosis not present

## 2023-08-12 DIAGNOSIS — K7460 Unspecified cirrhosis of liver: Secondary | ICD-10-CM | POA: Diagnosis not present

## 2023-08-12 DIAGNOSIS — I4819 Other persistent atrial fibrillation: Secondary | ICD-10-CM | POA: Diagnosis not present

## 2023-08-12 DIAGNOSIS — Z79899 Other long term (current) drug therapy: Secondary | ICD-10-CM | POA: Diagnosis not present

## 2023-08-12 DIAGNOSIS — Z87891 Personal history of nicotine dependence: Secondary | ICD-10-CM | POA: Diagnosis not present

## 2023-08-12 DIAGNOSIS — R2681 Unsteadiness on feet: Secondary | ICD-10-CM | POA: Diagnosis not present

## 2023-08-12 DIAGNOSIS — K760 Fatty (change of) liver, not elsewhere classified: Secondary | ICD-10-CM | POA: Diagnosis not present

## 2023-08-12 DIAGNOSIS — I700 Atherosclerosis of aorta: Secondary | ICD-10-CM | POA: Diagnosis not present

## 2023-08-12 DIAGNOSIS — G459 Transient cerebral ischemic attack, unspecified: Secondary | ICD-10-CM

## 2023-08-12 DIAGNOSIS — E063 Autoimmune thyroiditis: Secondary | ICD-10-CM | POA: Diagnosis not present

## 2023-08-12 DIAGNOSIS — I110 Hypertensive heart disease with heart failure: Secondary | ICD-10-CM | POA: Diagnosis not present

## 2023-08-12 DIAGNOSIS — K118 Other diseases of salivary glands: Secondary | ICD-10-CM | POA: Diagnosis not present

## 2023-08-12 DIAGNOSIS — I509 Heart failure, unspecified: Secondary | ICD-10-CM | POA: Diagnosis not present

## 2023-08-12 LAB — LIPID PANEL
Cholesterol: 137 mg/dL (ref 0–200)
HDL: 61 mg/dL (ref >40)
LDL Cholesterol: 67 mg/dL (ref 0–99)
Total CHOL/HDL Ratio: 2.2 ratio
Triglycerides: 46 mg/dL (ref <150)
VLDL: 9 mg/dL (ref 0–40)

## 2023-08-12 MED FILL — Fentanyl Citrate Preservative Free (PF) Inj 100 MCG/2ML: INTRAMUSCULAR | Qty: 2 | Status: AC

## 2023-08-12 NOTE — Discharge Summary (Addendum)
 ELECTROPHYSIOLOGY PROCEDURE DISCHARGE SUMMARY    Patient ID: Megan [name],  MRN: [id number], DOB/AGE: 05/14/1965 58 y.o.  Admit date: 08/11/2023 Discharge date: 08/12/2023  Primary Care Physician: Daryl Setter, NP  Primary Cardiologist: Lamar Fitch, MD  Electrophysiologist: Dr. Inocencio   Primary Discharge Diagnosis:  Atrial Fibrillation  Secondary Discharge Diagnosis:  none  Procedures This Admission:  1.  Electrophysiology study and Pulse Field catheter ablation of Atrial Fibrillation on 08/11/2023 by Dr. Inocencio .  This study demonstrated   1. Comprehensive electrophysiologic study. 2. Coronary sinus pacing and recording. 3. Three-dimensional mapping of atrial fibrillation with additional mapping and ablation of a second discrete focus 4. Ablation of atrial fibrillation with additional mapping and ablation of a second discrete focus 5. Intracardiac echocardiography. 6. Transseptal puncture of an intact septum. 7. Cardioversion.        Brief HPI: Megan [name] is a 58 y.o. female with a history of Atrial Fibrillation.  Risks, benefits, and alternatives to catheter ablation of Atrial Fibrillation were reviewed with the patient who wished to proceed.   The patient has been on uninterrupted anticoagulation for more than 3 weeks and did not require TEE.  Hospital Course:  The patient was admitted and underwent EPS/RFCA of Atrial Fibrillation with details as outlined above.  In recovery, she had L hand numbness so a Code Stroke was called. Head CT negative. Brain MRI with incidentally found parotid mass. They were monitored on telemetry overnight which demonstrated sinus rhythm with brief atrial tach.  Groin was without complication on the day of discharge.  The patient was examined and considered to be stable for discharge.  Wound care and restrictions were reviewed with the patient.  The patient Megan [name] be seen back by Afib Clinic in 4 weeks and Afib Clinic in 12 weeks  for post ablation follow up.   Physical Exam: Vitals:   08/11/23 1954 08/11/23 2354 08/12/23 0449 08/12/23 0800  BP: (!) 84/49 (!) 88/52 (!) 89/53 93/60  Pulse: (!) 54 (!) 52 (!) 54 (!) 51  Resp: 18 18 16 18   Temp: 98.2 F (36.8 C) 97.9 F (36.6 C) 98.2 F (36.8 C) 98.2 F (36.8 C)  TempSrc: Oral Oral Oral Oral  SpO2: 100% 96% 97%   Weight:      Height:        GEN- NAD. A&O x 3.  HEENT: Normocephalic, atraumatic Lungs- CTAB, Normal effort.  Heart- RRR, No M/G/R.  GI- Soft, NT, ND.  Extremities- No clubbing, cyanosis, or edema;  Skin- warm and dry, no rash or lesion, left chest without hematoma/ecchymosis  Discharge Medications:  Allergies as of 08/12/2023       Reactions   Hydrocodone Anaphylaxis   Tolerates oxycodone    Penicillins Rash   Rash as a child, has not had since childhood, patient states,04/11/19 Throat swells up as well Did it involve swelling of the face/tongue/throat, SOB, or low BP? Yes Did it involve sudden or severe rash/hives, skin peeling, or any reaction on the inside of your mouth or nose? Yes Did you need to seek medical attention at a hospital or doctor's office? Yes When did it last happen? childhood reaction.   If all above answers are NO, may proceed with cephalosporin use.   Gadolinium Derivatives    Reactions have happened twice, after leaving facility. Pt reports feeling hot in trunk but extremities were icy cold; shivering, vomiting the first time after receiving contrast.  Symptoms lasted 12-18 hours.     Bioflavonoid  Products    Elevated Blood Pressure  Particular brand that she no longer uses.   Chantix [varenicline] Nausea Only   Cymbalta  [duloxetine  Hcl]    headach   Glucosamine Forte [nutritional Supplements] Other (See Comments)   Elevated Blood Pressure   Metoclopramide     Created Lactation    Morphine Other (See Comments)   REACTION: irregular heart beat   Codeine Rash        Medication List     TAKE these  medications    acetaminophen  325 MG tablet Commonly known as: TYLENOL  Take 650 mg by mouth every 6 (six) hours as needed for moderate pain (pain score 4-6).   apixaban  5 MG Tabs tablet Commonly known as: ELIQUIS  Take 1 tablet (5 mg total) by mouth 2 (two) times daily.   azaTHIOprine  50 MG tablet Commonly known as: IMURAN  Take 50 mg by mouth daily.   Azelastine  HCl 137 MCG/SPRAY Soln PLACE 1 SPRAY INTO BOTH NOSTRILS 2 (TWO) TIMES DAILY. USE IN EACH NOSTRIL AS DIRECTED   baclofen  10 MG tablet Commonly known as: LIORESAL  Take 5 mg by mouth at bedtime.   CALCIUM PO Take 1,300 mg by mouth daily. 650 mg each Chewable   MULTIPLE VITAMIN/IRON PO Take 1 tablet by mouth daily. Chewable   neomycin -polymyxin b-dexamethasone  3.5-10000-0.1 Oint Commonly known as: MAXITROL  Place 1 Application into both eyes at bedtime.   ondansetron  4 MG disintegrating tablet Commonly known as: ZOFRAN -ODT Take 1 tablet (4 mg total) by mouth every 8 (eight) hours as needed.   oxyCODONE  5 MG immediate release tablet Commonly known as: Oxy IR/ROXICODONE  Take 5 mg by mouth every 6 (six) hours.   polyethylene glycol 17 g packet Commonly known as: MiraLax  Take 17 g by mouth daily.   PRESCRIPTION MEDICATION Place 1 drop into the right eye 3 (three) times daily as needed. Sil-Ophtho Lubricant Eye drop   Synthroid  137 MCG tablet Generic drug: levothyroxine  TAKE 1 TABLET BY MOUTH DAILY BEFORE BREAKFAST.        Disposition: Home with usual follow up as in AVS  Duration of Discharge Encounter:  APP time: 10 minutes  Signed, Megan Riddle, NP  08/12/2023 8:36 AM   I have seen and examined this patient with Megan [name].  Agree with above, note added to reflect my findings.  Patient presented to the hospital for atrial fibrillation ablation.  She remains in sinus rhythm with short runs of atrial tachycardia.  She had left hand numbness.  Evaluation by neurology with CT scan and MRI showed no  evidence of CVA.  Left hand numbness has improved.  She is feeling well today.  Megan [name] plan for discharge with follow-up in clinic.  GEN: No acute distress.   Neck: No JVD Cardiac: RRR, no murmurs, rubs, or gallops.  Respiratory: normal BS bases bilaterally. GI: Soft, nontender, non-distended  MS: No edema; No deformity. Neuro:  Nonfocal  Skin: warm and dry Psych: Normal affect   MD time at discharge: 22 minutes  Megan [name] M. Vernica Wachtel MD 08/12/2023 9:05 AM

## 2023-08-12 NOTE — Progress Notes (Addendum)
 STROKE TEAM PROGRESS NOTE   INTERIM HISTORY/SUBJECTIVE Patient had transient numbness in the left hand following elective cardioversion yesterday which has resolved.  MRI is negative for acute stroke.  CT angiogram shows no large vessel stenosis or occlusion.  LDL cholesterol 67 mg percent.  Hemoglobin A1c is 4.7. Concern for TIA yesterday with numbness of the left hand. Now fully resolved.  Recommend outpatient echocardiogram. Plan to discharge today. She is on eliquis  and will have follow up in the afib clinic.   OBJECTIVE  CBC    Component Value Date/Time   WBC 3.3 (L) 07/21/2023 0855   WBC 4.3 03/07/2023 1322   RBC 4.23 07/21/2023 0855   RBC 4.77 03/07/2023 1322   HGB 12.7 07/21/2023 0855   HGB 13.7 07/03/2014 1406   HGB 13.7 06/10/2007 1100   HCT 38.2 07/21/2023 0855   HCT 40.2 07/03/2014 1406   HCT 41.1 06/10/2007 1100   PLT 120 (L) 07/21/2023 0855   MCV 90 07/21/2023 0855   MCV 85 07/03/2014 1406   MCV 80.8 (L) 06/10/2007 1100   MCH 30.0 07/21/2023 0855   MCH 29.6 03/07/2023 1322   MCHC 33.2 07/21/2023 0855   MCHC 33.5 03/07/2023 1322   RDW 13.6 07/21/2023 0855   RDW 13.4 07/03/2014 1406   RDW 17.4 (H) 06/10/2007 1100   LYMPHSABS 0.9 06/02/2020 0051   LYMPHSABS 1.3 07/03/2014 1406   LYMPHSABS 2.0 06/10/2007 1100   MONOABS 0.7 06/02/2020 0051   MONOABS 0.5 06/10/2007 1100   EOSABS 0.1 06/02/2020 0051   EOSABS 0.3 07/03/2014 1406   BASOSABS 0.0 06/02/2020 0051   BASOSABS 0.0 07/03/2014 1406   BASOSABS 0.0 06/10/2007 1100    BMET    Component Value Date/Time   NA 139 07/21/2023 0855   K 4.5 07/21/2023 0855   CL 102 07/21/2023 0855   CO2 24 07/21/2023 0855   GLUCOSE 79 07/21/2023 0855   GLUCOSE 118 (H) 03/07/2023 1322   BUN 23 07/21/2023 0855   CREATININE 0.55 (L) 07/21/2023 0855   CREATININE 0.59 05/04/2020 1537   CALCIUM 8.6 (L) 07/21/2023 0855   EGFR 105 07/21/2023 0855   GFRNONAA >60 03/07/2023 1322    IMAGING past 24 hours MR BRAIN WO  CONTRAST Result Date: 08/11/2023 CLINICAL DATA:  Mental status change, unknown cause EXAM: MRI HEAD WITHOUT CONTRAST TECHNIQUE: Multiplanar, multiecho pulse sequences of the brain and surrounding structures were obtained without intravenous contrast. COMPARISON:  CT head earlier today. FINDINGS: Brain: No acute infarction, acute hemorrhage, hydrocephalus, extra-axial collection or mass lesion. Small focus of susceptibility artifact in the right frontal lobe and right cerebellum, compatible with prior microhemorrhages. Vascular: Normal flow voids. Skull and upper cervical spine: Normal marrow signal. Sinuses/Orbits: Right globe prosthesis.  No acute orbital findings. Other: Approximately 1.8 cm right parotid mass IMPRESSION: 1. No evidence of acute intracranial abnormality. 2. 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 Recommend ENT consultation. Electronically Signed   By: Gilmore GORMAN Molt [name]   On: 08/11/2023 20:49   CT ANGIO HEAD NECK W WO CM (CODE STROKE) Result Date: 08/11/2023 CLINICAL DATA:  Provided history: Neuro deficit, acute, stroke suspected. Post cardiac ablation. EXAM: CT ANGIOGRAPHY HEAD AND NECK WITH AND WITHOUT CONTRAST TECHNIQUE: Multidetector CT imaging of the head and neck was performed using the standard protocol during bolus administration of intravenous contrast. Multiplanar CT image reconstructions and MIPs were obtained to evaluate the vascular anatomy. Carotid stenosis measurements (when applicable) are obtained utilizing NASCET criteria, using the distal  internal carotid diameter as the denominator. RADIATION DOSE REDUCTION: This exam was performed according to the departmental dose-optimization program which includes automated exposure control, adjustment of the mA and/or kV according to patient size and/or use of iterative reconstruction technique. CONTRAST:  75mL OMNIPAQUE  IOHEXOL  350 MG/ML SOLN COMPARISON:  Noncontrast head CT performed  earlier today 08/11/2023 FINDINGS: CTA NECK FINDINGS Aortic arch: Standard aortic branching. Mild atherosclerotic plaque within the aortic arch. Streak/beam hardening artifact arising from a dense contrast bolus partially obscures the right subclavian artery. Within this limitation, there is no appreciable hemodynamically significant innominate or proximal subclavian artery stenosis. Right carotid system: CCA and ICA patent within the neck without stenosis or significant atherosclerotic disease. Tortuosity of the cervical ICA. Left carotid system: CCA and ICA patent within the neck without stenosis or significant atherosclerotic disease. Tortuosity of the cervical ICA. Vertebral arteries: Venous reflux of contrast partially obscures the left vertebral artery V1 segment. Within this limitation, the vertebral arteries are patent within the neck without stenosis or significant atherosclerotic disease. The right vertebral artery is dominant. Skeleton: The patient is edentulous. Nonspecific reversal of the expected cervical lordosis. Slight grade 1 anterolisthesis at C2-C3. Spondylosis at the cervical and visualized upper thoracic levels. No acute fracture or aggressive osseous lesion. Other neck: No neck mass or cervical lymphadenopathy. Upper chest: No consolidation within the imaged lung apices. Review of the MIP images confirms the above findings CTA HEAD FINDINGS Anterior circulation: The intracranial internal carotid arteries are patent. The M1 middle cerebral arteries are patent. No M2 proximal branch occlusion or high-grade proximal stenosis. The anterior cerebral arteries are patent. No intracranial aneurysm is identified. Posterior circulation: The intracranial vertebral arteries are patent. The basilar artery is patent. The posterior cerebral arteries are patent. A left posterior communicating artery is present. The right posterior communicating artery is diminutive or absent. Venous sinuses: Within the  limitations of contrast timing, no convincing thrombus. Anatomic variants: As described. Review of the MIP images confirms the above findings No emergent large vessel occlusion identified. These results were communicated to Dr. Vanessa at 1:40 pmon 7/29/2025by text page via the [facility] messaging system. IMPRESSION: CTA neck: 1. Venous reflux of contrast partially obscures the left vertebral artery V1 segment. Within this limitation, the common carotid, internal carotid and vertebral arteries are patent within neck without stenosis or significant atherosclerotic disease. No evidence of dissection. 2. Aortic Atherosclerosis (ICD10-I70.0). CTA head: No proximal intracranial large vessel occlusion or high-grade proximal arterial stenosis identified. Electronically Signed   By: Rockey Childs D.O.   On: 08/11/2023 13:41   CT HEAD CODE STROKE WO CONTRAST Result Date: 08/11/2023 CLINICAL DATA:  Code stroke.  Neuro deficit, concern for stroke. EXAM: CT HEAD WITHOUT CONTRAST TECHNIQUE: Contiguous axial images were obtained from the base of the skull through the vertex without intravenous contrast. RADIATION DOSE REDUCTION: This exam was performed according to the departmental dose-optimization program which includes automated exposure control, adjustment of the mA and/or kV according to patient size and/or use of iterative reconstruction technique. COMPARISON:  None Available. FINDINGS: Brain: No acute intracranial hemorrhage. No CT evidence of acute infarct. Focal hypoattenuation in the anterior left frontal lobe which may reflect artifact versus focus of encephalomalacia in the setting of prior trauma. No edema, mass effect, or midline shift. The basilar cisterns are patent. Ventricles: The ventricles are normal. Vascular: No hyperdense vessel or unexpected calcification. Skull: No acute or aggressive finding. Orbits: Right ocular prosthesis.  The left orbit is unremarkable. Sinuses: Mucosal thickening throughout  the  ethmoid sinuses. Additional mild mucosal thickening in the partially visualized maxillary sinuses. Other: Mastoid air cells are clear. ASPECTS [facility] Stroke Program Early CT Score) - Ganglionic level infarction (caudate, lentiform nuclei, internal capsule, insula, M1-M3 cortex): 7 - Supraganglionic infarction (M4-M6 cortex): 3 Total score (0-10 with 10 being normal): 10 IMPRESSION: 1. No CT evidence of acute intracranial abnormality. 2. Hypoattenuation in the anterior left frontal lobe, artifact versus sequelae of prior trauma. 3. ASPECTS is 10 These results were communicated to Dr. Vanessa at 1:10 pm on 08/11/2023 by text page via the [facility] messaging system. Electronically Signed   By: Donnice Mania [name]   On: 08/11/2023 13:10   EP STUDY Result Date: 08/11/2023 SURGEON:  Soyla Norton, MD PREPROCEDURE DIAGNOSES: 1. Persistent atrial fibrillation. POSTPROCEDURE DIAGNOSES: 1. Persistent atrial fibrillation. PROCEDURES: 1. Comprehensive electrophysiologic study. 2. Coronary sinus pacing and recording. 3. Three-dimensional mapping of atrial fibrillation with additional mapping and ablation of a second discrete focus 4. Ablation of atrial fibrillation with additional mapping and ablation of a second discrete focus 5. Intracardiac echocardiography. 6. Transseptal puncture of an intact septum. 7. Cardioversion INTRODUCTION:  BEYA TIPPS is a 58 y.o. female with a history of persistent atrial fibrillation who now presents for EP study and radiofrequency ablation.  The patient reports initially being diagnosed with atrial fibrillation after presenting with symptomatic palpitations and fatgiue. The patient reports increasing frequency and duration of atrial fibrillation since that time.  The patient has failed medical therapy.  The patient therefore presents today for catheter ablation of atrial fibrillation. DESCRIPTION OF PROCEDURE:  Informed written consent was obtained, and the patient was brought to the  electrophysiology lab in a fasting state.  The patient was adequately sedated with intravenous medications as outlined in the anesthesia report.  The patient's left and right groins were prepped and draped in the usual sterile fashion by the EP lab staff.  Using a percutaneous Seldinger technique, 1 8-French hemostasis sheaths were placed in the right femoral vein, and one 7 [place] and one 9-French hemostasis sheaths were placed into the left common femoral vein. The right femoral vein sheath site was preclosed using an Abbott Perclose and closed at the end of the case. A Biosense Webster Penta ray catheter was advanced into the atrium to make a right atrial map. Direct ultrasound guidance is used for right and left femoral veins with normal vessel patency. Ultrasound images are captured and stored in the patient's chart. Using ultrasound guidance, the Brockenbrough needle and wire were visualized entering the vessel. Catheter Placement:  A 7-French Biosense Webster Decapolar coronary sinus catheter was introduced through the right common femoral vein and advanced into the coronary sinus for recording and pacing from this location.  Initial Measurements: The patient presented to the electrophysiology lab in atrial fibrillation. her  QRS measured 105 msec and a QT interval of 342 msec. Intracardiac Echocardiography: An 8-French Biosense Webster AcuNav intracardiac echocardiography catheter was introduced through the right common femoral vein and advanced into the right atrium. Intracardiac echocardiography was performed of the left atrium, and a three-dimensional anatomical rendering of the left atrium was performed using CARTO sound technology.  The patient was noted to have a moderate sized left atrium.  The interatrial septum was prominent but not aneurysmal. All 4 pulmonary veins were visualized and noted to have separate ostia.  The pulmonary veins were moderate in size.  The left atrial appendage was visualized  and did not reveal thrombus.   There was no  evidence of pulmonary vein stenosis. Transseptal Puncture: The right common femoral vein sheath was exchanged for one Faradrive sheath and transseptal access was achieved in a standard fashion using a Bayless pigtail wire under fluoroscopy with intracardiac echocardiography confirmation of the transseptal puncture.  Once transseptal access had been achieved, heparin  was administered intravenously and intra- arterially in order to maintain an ACT of greater than 350 seconds throughout the procedure. 3D Mapping and Ablation:  A  Biosense Webster Octarray catheter was advanced into the left atrium through the Faradrive sheath.  The Octarray mapping catheter was introduced through the transseptal sheath and positioned over the mouth of all 4 pulmonary veins.  Three-dimensional electroanatomical mapping was performed using CARTO technology.  This demonstrated electrical activity within all four pulmonary veins at baseline.  The [facility] catheter was then placed into the left atrium.  The patient underwent successful sequential electrical isolation of all four pulmonary veins using pulsed field energy with 50 total pulses delivered.  Entrance and exit block were confirmed. Due to persistence of atrial fibrillation and risk of left atrial flutter, ablation using PFA was performed across the posterior wall. The Farawave catheter was removed from the body and the pentarray catheter was introduced into the left atrium. 3D mapping was performed which confirmed electrical isolation of the pulmonary veins and posterior wall. Cardioversion: The patient was then cardioverted to sinus rhythm with a single synchronized 200-J biphasic shock with cardioversion electrodes in the anterior-posterior thoracic configuration. He maintained sinus rhythm initially but then had recurence of afib with catheter manipulation within the left atrial, requiring repeat cardioversion with 200 J biphasic.   He remained in sinus rhythm thereafter. Measurements Following Ablation: In sinus rhythm with RR interval was 1081 msec, with PR 172 msec, QRS 105 msec, and QT 385 msec.  Following ablation the AH interval measured 122 msec with an HV interval of 61 msec. Ventricular pacing was performed, which revealed VA dissociation when pacing at 600 msec. Rapid atrial pacing was performed, which revealed an AV Wenckebach cycle length of 330 msec.  Electroisolation was then again confirmed in all four pulmonary veins.  Pacing was performed along the ablation line which confirmed entrance and exit block. The procedure was therefore considered completed.  40 mg protamine was given for heparin  reversal. All catheters were removed, and the sheaths were aspirated and flushed.  A Perclose device was used for the right femoral vein sheath.  A Mynx closure device was used for the left femoral vein sheaths for hemostasis. The patient was transferred to the recovery area. Intracardiac echocardiogram revealed no pericardial effusion.  There were no early apparent complications. CONCLUSIONS: Atrial fibrillation upon presentation.  2. Successful electrical isolation and anatomical encircling of all four pulmonary veins with pulse field ablation. 3. Ablation of posterior wall with pulse field ablation 4. No early apparent complications. Will Martin Camnitz,MD 10:44 AM 08/11/2023    Vitals:   08/11/23 1954 08/11/23 2354 08/12/23 0449 08/12/23 0800  BP: (!) 84/49 (!) 88/52 (!) 89/53 93/60  Pulse: (!) 54 (!) 52 (!) 54 (!) 51  Resp: 18 18 16 18   Temp: 98.2 F (36.8 C) 97.9 F (36.6 C) 98.2 F (36.8 C) 98.2 F (36.8 C)  TempSrc: Oral Oral Oral Oral  SpO2: 100% 96% 97%   Weight:      Height:         PHYSICAL EXAM General:  Alert, well-nourished, well-developed patient in no acute distress Psych:  Mood and affect appropriate for situation CV: Regular  rate and rhythm on monitor Respiratory:  Regular, unlabored respirations on  room air GI: Abdomen soft and nontender   NEURO:  Mental Status: AA&Ox3, patient is able to give clear and coherent history Speech/Language: speech is without dysarthria or aphasia.  Naming, repetition, fluency, and comprehension intact.  Cranial Nerves:  II: PERRL. Visual fields full in left eye. Right eye prosthetic  III, IV, VI: EOMI. Eyelids elevate symmetrically.  V: Sensation is intact to light touch and symmetrical to face.  VII: Face is symmetrical resting and smiling VIII: hearing intact to voice. IX, X: Palate elevates symmetrically. Phonation is normal.  KP:Dynloizm shrug 5/5. XII: tongue is midline without fasciculations. Motor: 5/5 strength to all muscle groups tested.  Tone: is normal and bulk is normal Sensation- Intact to light touch bilaterally. Extinction absent to light touch to DSS.   Coordination: FTN intact bilaterally, HKS: no ataxia in BLE.No drift.  Gait- deferred  Most Recent NIH 0     ASSESSMENT/PLAN  Ms. STINA GANE is a 58 y.o. 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  In recovery, she had L hand numbness so a Code Stroke was called. Head CT negative. Brain MRI with incidentally found parotid mass. They were monitored on telemetry overnight which demonstrated sinus rhythm with brief atrial tach.   Right hemispheric TIA: Likely cardioembolic Code Stroke CT head No acute abnormality.  CTA head & neck No LVO MRI  no acute intracranial abnormality, 1.8 cm right parotid mass  2D Echo EF 40-45% LDL 67 HgbA1c 4.6 VTE prophylaxis - Eliquis   Eliquis  (apixaban ) daily prior to admission, now on Eliquis  (apixaban ) daily  Therapy recommendations:  No follow up needed  Disposition:  Home   Atrial fibrillation Home Meds: Eliquis  S/p ablation on 7/29 Follow  with afib clinic   Other Stroke Risk Factors Hypertension- no home medications  Obesity, Body mass index is 33.38 kg/m., BMI >/= 30 associated with increased stroke risk, recommend weight loss, diet and exercise as appropriate   Other Active Problems Hashimotos Fatty liver due to autoimmune hepatitis Cirrhosis  Hospital day # 0  Patient seen and examined by NP/APP with MD. MD to update note as needed.   Jorene Last, DNP, FNP-BC [facility] Pager: [phone]  I have personally obtained history,examined this patient, reviewed notes, independently viewed imaging studies, participated in medical decision making and plan of care.ROS completed by me personally and pertinent positives fully documented  I have made any additions or clarifications directly to the above note. Agree with note above.  Patient developed transient left hand numbness likely a TIA in the setting of elective cardioversion for her A-fib.  MRI is negative for acute stroke and CT angiogram shows no large vessel stenosis or occlusion.  Lipid profile and A1c is satisfactorily controlled.  Continue Eliquis  for secondary stroke prevention maintain aggressive risk factor modification.  Follow-up as an outpatient stroke clinic in 2 months.  I personally spent a total of 35 minutes in the care of the patient today including getting/reviewing separately obtained history, performing a medically appropriate exam/evaluation, counseling and educating, placing orders, referring and communicating with other health care professionals, documenting clinical information in the EHR, independently interpreting results, and coordinating care.         Eather Popp, MD Medical Director [facility] Stroke Center Pager: [phone] 08/12/2023 3:38 PM  To contact Stroke Continuity provider, please refer to [email or web address]. After hours, contact General Neurology

## 2023-08-12 NOTE — TOC CM/SW Note (Signed)
 Transition of Care [facility]) - Inpatient Brief Assessment   Patient Details  Name: Megan [name] MRN: [id number] Date of Birth: 1965/07/29  [facility] of Care [facility]) CM/SW Contact:    Sudie Erminio Deems, RN Phone Number: 08/12/2023, 10:06 AM   Clinical Narrative: Patient presented for ablation. PTA patient was from home with family support. Case Manager discussed with patient regarding recommendations for outpatient PT and the patient declined services. Patient states she has had extensive rehab in the past and she feels that she does not need at this time. No further needs identified.   Transition of Care Asessment: Insurance and Status: Insurance coverage has been reviewed Patient has primary care physician: Yes Home environment has been reviewed: reviewed Prior level of function:: independent Prior/Current Home Services: No current home services Social Drivers of Health Review: SDOH reviewed no interventions necessary Readmission risk has been reviewed: Yes Transition of care needs: no transition of care needs at this time

## 2023-08-12 NOTE — Progress Notes (Signed)
 Physical Therapy Treatment Patient Details Name: Megan [name] MRN: [id number] DOB: May 15, 1965 Today's Date: 08/12/2023   History of Present Illness Pt is a 58 y.o. F who presents 08/11/2023 with catheter ablation for atrial fibrillation. Post op, left hand numbness and grip weakness noted. Code stroke was called. CTH negative for bleed. 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    PT Comments  Pt slowly progressing. Currently pt reports she is close to her baseline. She states that her balance has been poor since she started getting joint surgeries and would like to defer PT at this time though outpatient physical therapy for balance is highly recommended in order to decrease risk for falls. Pt educated on how to speak to her PCP about OPPT if she decides to change her mind. Currently pt is Ind for bed mobility and sit to stand, supervision for gait due to impaired balance. Will continue to follow in acute care hospital setting as able and appropriate.     If plan is discharge home, recommend the following: Assistance with cooking/housework;Assist for transportation     Equipment Recommendations  None recommended by PT       Precautions / Restrictions Precautions Precautions: Fall Restrictions Weight Bearing Restrictions Per Provider Order: No     Mobility  Bed Mobility Overal bed mobility: Independent      Transfers Overall transfer level: Independent Equipment used: None      Ambulation/Gait Ambulation/Gait assistance: Supervision Gait Distance (Feet): 400 Feet Assistive device: None Gait Pattern/deviations: Step-through pattern, Decreased stride length, Narrow base of support Gait velocity: decreased Gait velocity interpretation: 1.31 - 2.62 ft/sec, indicative of limited community ambulator   General Gait Details: Unsteadiness without intermittent  external support. Pt states this is her baseline   Stairs Stairs:  (not applicable pt has no stairs at home)           Modified Rankin (Stroke Patients Only) Modified Rankin (Stroke Patients Only) Pre-Morbid Rankin Score: No symptoms Modified Rankin: Moderate disability     Balance Overall balance assessment: Mild deficits observed, not formally tested      Communication Communication Communication: No apparent difficulties  Cognition Arousal: Alert Behavior During Therapy: WFL for tasks assessed/performed   PT - Cognitive impairments: No apparent impairments         Following commands: Intact      Cueing Cueing Techniques: Verbal cues     General Comments General comments (skin integrity, edema, etc.): Discussed with pt talking to her PCP about out patient physical therapy for her balance. Pt states that she will do that if she feels like she needs to. BP 96/50 MAP 64 initially, after gait 104/67 and MAP 78      Pertinent Vitals/Pain Pain Assessment Pain Assessment: Faces Faces Pain Scale: Hurts a little bit Pain Location: headache Pain Descriptors / Indicators: Headache Pain Intervention(s): Monitored during session     PT Goals (current goals can now be found in the care plan section) Acute Rehab PT Goals Patient Stated Goal: back to baseline PT Goal Formulation: With patient Time For Goal Achievement: 08/25/23 Potential to Achieve Goals: Good Progress towards PT goals: Progressing toward goals    Frequency    Min 2X/week      PT Plan  Continue with current POC        AM-PAC PT 6 Clicks Mobility   Outcome Measure  Help needed turning from your back to your side  while in a flat bed without using bedrails?: None Help needed moving from lying on your back to sitting on the side of a flat bed without using bedrails?: None Help needed moving to and from a bed to a chair (including a wheelchair)?: None Help needed standing up from a chair  using your arms (e.g., wheelchair or bedside chair)?: None Help needed to walk in hospital room?: A Little Help needed climbing 3-5 steps with a railing? : A Little 6 Click Score: 22    End of Session Equipment Utilized During Treatment: Gait belt Activity Tolerance: Patient tolerated treatment well Patient left: with call bell/phone within reach;in bed Nurse Communication: Mobility status PT Visit Diagnosis: Unsteadiness on feet (R26.81)     Time: 9149-9098 PT Time Calculation (min) (ACUTE ONLY): 11 min  Charges:    $Therapeutic Activity: 8-22 mins PT General Charges $$ ACUTE PT VISIT: 1 Visit                     Dorothyann Maier, DPT, CLT  Acute Rehabilitation Services Office: [phone] (Secure chat preferred)    Dorothyann VEAR Maier 08/12/2023, 9:17 AM

## 2023-08-12 NOTE — Discharge Instructions (Signed)

## 2023-08-12 NOTE — Plan of Care (Signed)
  Problem: Education: Goal: Knowledge of General Education information will improve Description: Including pain rating scale, medication(s)/side effects and non-pharmacologic comfort measures Outcome: Adequate for Discharge   Problem: Health Behavior/Discharge Planning: Goal: Ability to manage health-related needs will improve Outcome: Adequate for Discharge   Problem: Clinical Measurements: Goal: Ability to maintain clinical measurements within normal limits will improve Outcome: Adequate for Discharge Goal: Will remain free from infection Outcome: Adequate for Discharge Goal: Diagnostic test results will improve Outcome: Adequate for Discharge Goal: Respiratory complications will improve Outcome: Adequate for Discharge Goal: Cardiovascular complication will be avoided Outcome: Adequate for Discharge   Problem: Activity: Goal: Risk for activity intolerance will decrease Outcome: Adequate for Discharge   Problem: Nutrition: Goal: Adequate nutrition will be maintained Outcome: Adequate for Discharge   Problem: Coping: Goal: Level of anxiety will decrease Outcome: Adequate for Discharge   Problem: Elimination: Goal: Will not experience complications related to bowel motility Outcome: Adequate for Discharge Goal: Will not experience complications related to urinary retention Outcome: Adequate for Discharge   Problem: Pain Managment: Goal: General experience of comfort will improve and/or be controlled Outcome: Adequate for Discharge   Problem: Safety: Goal: Ability to remain free from injury will improve Outcome: Adequate for Discharge   Problem: Skin Integrity: Goal: Risk for impaired skin integrity will decrease Outcome: Adequate for Discharge   Problem: Education: Goal: Understanding of disease, treatment, and recovery process will improve Outcome: Adequate for Discharge   Problem: Activity: Goal: Ability to return to baseline activity level will  improve Outcome: Adequate for Discharge   Problem: Cardiac: Goal: Ability to maintain adequate cardiovascular perfusion will improve Outcome: Adequate for Discharge Goal: Vascular access site(s) Level 0-1 will be maintained Outcome: Adequate for Discharge   Problem: Health Behavior/ Discharge Planning: Goal: Ability to safely manage health related needs after discharge Outcome: Adequate for Discharge   Problem: Education: Goal: Knowledge of disease or condition will improve Outcome: Adequate for Discharge Goal: Knowledge of secondary prevention will improve (MUST DOCUMENT ALL) Outcome: Adequate for Discharge Goal: Knowledge of patient specific risk factors will improve (DELETE if not current risk factor) Outcome: Adequate for Discharge   Problem: Ischemic Stroke/TIA Tissue Perfusion: Goal: Complications of ischemic stroke/TIA will be minimized Outcome: Adequate for Discharge   Problem: Coping: Goal: Will verbalize positive feelings about self Outcome: Adequate for Discharge Goal: Will identify appropriate support needs Outcome: Adequate for Discharge   Problem: Health Behavior/Discharge Planning: Goal: Ability to manage health-related needs will improve Outcome: Adequate for Discharge Goal: Goals will be collaboratively established with patient/family Outcome: Adequate for Discharge   Problem: Self-Care: Goal: Ability to participate in self-care as condition permits will improve Outcome: Adequate for Discharge Goal: Verbalization of feelings and concerns over difficulty with self-care will improve Outcome: Adequate for Discharge Goal: Ability to communicate needs accurately will improve Outcome: Adequate for Discharge   Problem: Nutrition: Goal: Risk of aspiration will decrease Outcome: Adequate for Discharge Goal: Dietary intake will improve Outcome: Adequate for Discharge   Problem: Acute Rehab PT Goals(only PT should resolve) Goal: Pt Will Ambulate Outcome:  Adequate for Discharge

## 2023-08-13 ENCOUNTER — Encounter (INDEPENDENT_AMBULATORY_CARE_PROVIDER_SITE_OTHER): Payer: Self-pay

## 2023-08-13 ENCOUNTER — Encounter: Payer: Self-pay | Admitting: Cardiology

## 2023-08-14 NOTE — Telephone Encounter (Signed)
 Followed up with pt, advised to follow up with PCP per Dr. Inocencio. Pt agreeable to plan.

## 2023-08-14 NOTE — Telephone Encounter (Signed)
 Called pt in regards to my chart concern.   Reports looks pale and this is not normal for her.  Sister expressed pt color looks fine.  Pt reports feels heart speeding up off and on.  Has a persistent HA since AF ablation on 08/11/23.  Wants to know if needs lab work or an echo.   Pt denies noticeable bleeding.  Reports drinks about 3 16 oz bottles of water daily along with about 2 protein shakes.  Does not check BP at home.  Reports feels a little tired. HA mainly to back of neck is relieved with Tylenol .  Advised pt of ED precautions will send to MD and RN to f/u.

## 2023-08-21 ENCOUNTER — Ambulatory Visit: Payer: Self-pay | Admitting: *Deleted

## 2023-08-21 NOTE — Telephone Encounter (Signed)
 Copied from CRM [id number]. Topic: Clinical - Red Word Triage >> Aug 21, 2023  8:41 AM Franky GRADE wrote: Red Word that prompted transfer to Nurse Triage: Patient is experiencing fatigue, color has not returned, headaches post cardiac ablation that was done about a week and a half ago. Reason for Disposition  [1] MODERATE headache (e.g., interferes with normal activities) AND [2] present > 24 hours AND [3] unexplained  (Exceptions: Pain medicines not tried, typical migraine, or headache part of viral illness.)  Answer Assessment - Initial Assessment Questions 1. LOCATION: Where does it hurt?      I have a headace 2. ONSET: When did the headache start? (e.g., minutes, hours, days)      I've had a headache since having a post cardiac ablation.   They keep coming back after the Tylenol  wears off.    My color is pale compared to my usual. I called the cardiologist.   They told me to contact my PCP. I'm surprised they referred me to my PCP.   I would think the dr that did the procedure would take care of this. I'm feeling tired.   3. PATTERN: Does the pain come and go, or has it been constant since it started?     Constant headache      My skin color an paleness concern.    No hematoma at the site where I had the procedure.   I also have a liver illness.    4. SEVERITY: How bad is the pain? and What does it keep you from doing?  (e.g., Scale 1-10; mild, moderate, or severe)     I'm having headaches since the procedures.  I do have erratic heart beats which they told me would happen.    I've been in A Fib for so long.    I can feel it.   It does jump out of rhythm when I'm resting.    I can feel it.   I get a little short of breath when it happens then I get back in regular rhythm.   5. RECURRENT SYMPTOM: Have you ever had headaches before? If Yes, ask: When was the last time? and What happened that time?      No 6. CAUSE: What do you think is causing the headache?     I'm not sure. 7.  MIGRAINE: Have you been diagnosed with migraine headaches? If Yes, ask: Is this headache similar?      Never  8. HEAD INJURY: Has there been any recent injury to your head?      No 9. OTHER SYMPTOMS: Do you have any other symptoms? (e.g., fever, stiff neck, eye pain, sore throat, cold symptoms)     See above 10. PREGNANCY: Is there any chance you are pregnant? When was your last menstrual period?       N/A due to age  Protocols used: Headache-A-AH Pt was offered a sooner appt but she only wanted to see Eleanor Ponto, NP.   She knows what all is going on with me.  I made her an appt for hospital follow up.        FYI Only or Action Required?: FYI only for provider.  Patient was last seen in primary care on 06/12/2023 by Frann Mabel Mt, DO.  Called Nurse Triage reporting Headache. Been having a headache since having a cardiac ablation done for A. Fib on 08/11/2023.  Taking Tylenol  but when it wears off the headache comes back.  She  is also c/o being pale and fatigued since the ablation.   She called her cardiologist who did the ablation and was instructed to contact her PCP.  Pt requesting to have blood work done.    Symptoms began several weeks ago.After the ablation is when these symptoms began.    She can feel when she goes in and out of A. Fib.   They told her she would continue to do this for a while after the ablation.  I go right back in a regular rhythm quickly when it happens.   I will go to the ED if my symptoms become worse.    Interventions attempted: OTC medications: Tylenol   When it wears off the headache returns.  Symptoms are: gradually worsening. The paleness and fatigue and the headaches.  Triage Disposition: See PCP Within 2 Weeks  Patient/caregiver understands and will follow disposition?: Yes

## 2023-08-26 ENCOUNTER — Ambulatory Visit (INDEPENDENT_AMBULATORY_CARE_PROVIDER_SITE_OTHER): Admitting: Family

## 2023-08-26 VITALS — BP 110/69 | HR 54 | Temp 98.0°F | Resp 16 | Ht 66.0 in | Wt 212.0 lb

## 2023-08-26 DIAGNOSIS — Z85840 Personal history of malignant neoplasm of eye: Secondary | ICD-10-CM

## 2023-08-26 DIAGNOSIS — Z6834 Body mass index (BMI) 34.0-34.9, adult: Secondary | ICD-10-CM

## 2023-08-26 DIAGNOSIS — I480 Paroxysmal atrial fibrillation: Secondary | ICD-10-CM

## 2023-08-26 DIAGNOSIS — E66811 Obesity, class 1: Secondary | ICD-10-CM

## 2023-08-26 DIAGNOSIS — I152 Hypertension secondary to endocrine disorders: Secondary | ICD-10-CM

## 2023-08-26 DIAGNOSIS — E039 Hypothyroidism, unspecified: Secondary | ICD-10-CM

## 2023-08-26 DIAGNOSIS — D696 Thrombocytopenia, unspecified: Secondary | ICD-10-CM | POA: Diagnosis not present

## 2023-08-26 DIAGNOSIS — K118 Other diseases of salivary glands: Secondary | ICD-10-CM | POA: Diagnosis not present

## 2023-08-26 DIAGNOSIS — R519 Headache, unspecified: Secondary | ICD-10-CM | POA: Diagnosis not present

## 2023-08-26 DIAGNOSIS — M0580 Other rheumatoid arthritis with rheumatoid factor of unspecified site: Secondary | ICD-10-CM | POA: Insufficient documentation

## 2023-08-26 DIAGNOSIS — E1159 Type 2 diabetes mellitus with other circulatory complications: Secondary | ICD-10-CM | POA: Diagnosis not present

## 2023-08-26 DIAGNOSIS — G459 Transient cerebral ischemic attack, unspecified: Secondary | ICD-10-CM | POA: Insufficient documentation

## 2023-08-26 NOTE — Assessment & Plan Note (Signed)
 I think this is due to musculoskeletal pain at the base of her C Spine posteriorly. Monitor for now.

## 2023-08-26 NOTE — Assessment & Plan Note (Signed)
 Slow rate atrial fibrillation, continue with apixaban as tolerated.

## 2023-08-26 NOTE — Assessment & Plan Note (Signed)
 Update TSH, continue synthroid .

## 2023-08-26 NOTE — Assessment & Plan Note (Signed)
 This has been evaluated by Rheumatology- Dr. Jeannetta.

## 2023-08-26 NOTE — Assessment & Plan Note (Signed)
 New finding, ENT evaluation pending.

## 2023-08-26 NOTE — Assessment & Plan Note (Signed)
 Already on eliquis . Cholesterol at goal without statin of LDL <70. Lab Results  Component Value Date   CHOL 137 08/12/2023   HDL 61 08/12/2023   LDLCALC 67 08/12/2023   TRIG 46 08/12/2023   CHOLHDL 2.2 08/12/2023

## 2023-08-26 NOTE — Patient Instructions (Addendum)
 VISIT SUMMARY:  Today, we discussed your recurring headaches, neck pain, and other health concerns including your parotid gland mass, atrial fibrillation, and cirrhosis. We reviewed your symptoms and planned further evaluations and monitoring.  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 Your history of smoking increases the risk of this being malignant. -You will be referred to an ENT specialist for further evaluation and a possible biopsy.  ATRIAL FIBRILLATION, STATUS POST ABLATION: You have been experiencing less frequent and shorter episodes of irregular heart rhythm since your ablation procedure. -We will continue to monitor your symptoms of atrial fibrillation. -Your heart rhythm will be evaluated during your examination.  TRANSIENT ISCHEMIC ATTACK (TIA): You had a suspected TIA after your ablation, but there was no permanent damage seen on imaging. Your cholesterol levels are within the target range. -We will keep monitoring your cholesterol levels. -You should consult with your cardiologist for ongoing cholesterol management.  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 Your current platelet count is 120,000, and there is no immediate bleeding risk. -A complete blood count (CBC) will be ordered to monitor your platelet count.  HEADACHE AND POSTERIOR NECK PAIN, LIKELY MUSCULOSKELETAL: Your headaches and neck pain are likely due to musculoskeletal issues, and there were no new findings on your imaging. -We will consider musculoskeletal causes for your pain. -Your neck will be evaluated for any musculoskeletal tenderness.  EPISTAXIS: You have occasional nosebleeds that resolve on their own and are not currently a significant concern.

## 2023-08-26 NOTE — Assessment & Plan Note (Signed)
 Continues to work on weight loss

## 2023-08-26 NOTE — Assessment & Plan Note (Signed)
 As a child, s/p removal of diseased eye.
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
## 2023-08-27 ENCOUNTER — Encounter: Payer: Self-pay | Admitting: Family

## 2023-08-27 ENCOUNTER — Ambulatory Visit: Payer: Self-pay | Admitting: Family

## 2023-08-27 DIAGNOSIS — E039 Hypothyroidism, unspecified: Secondary | ICD-10-CM

## 2023-08-27 LAB — CBC WITH DIFFERENTIAL/PLATELET
Basophils Absolute: 0 K/uL (ref 0.0–0.1)
Basophils Relative: 1 % (ref 0.0–3.0)
Eosinophils Absolute: 0.2 K/uL (ref 0.0–0.7)
Eosinophils Relative: 5.5 % — ABNORMAL HIGH (ref 0.0–5.0)
HCT: 35.4 % — ABNORMAL LOW (ref 36.0–46.0)
Hemoglobin: 12 g/dL (ref 12.0–15.0)
Lymphocytes Relative: 21 % (ref 12.0–46.0)
Lymphs Abs: 0.8 K/uL (ref 0.7–4.0)
MCHC: 34 g/dL (ref 30.0–36.0)
MCV: 87.5 fl (ref 78.0–100.0)
Monocytes Absolute: 0.3 K/uL (ref 0.1–1.0)
Monocytes Relative: 7 % (ref 3.0–12.0)
Neutro Abs: 2.6 K/uL (ref 1.4–7.7)
Neutrophils Relative %: 65.5 % (ref 43.0–77.0)
Platelets: 124 K/uL — ABNORMAL LOW (ref 150.0–400.0)
RBC: 4.04 Mil/uL (ref 3.87–5.11)
RDW: 14.7 % (ref 11.5–15.5)
WBC: 4 K/uL (ref 4.0–10.5)

## 2023-08-27 LAB — BASIC METABOLIC PANEL WITH GFR
BUN: 21 mg/dL (ref 6–23)
CO2: 33 meq/L — ABNORMAL HIGH (ref 19–32)
Calcium: 8.4 mg/dL (ref 8.4–10.5)
Chloride: 100 meq/L (ref 96–112)
Creatinine, Ser: 0.49 mg/dL (ref 0.40–1.20)
GFR: 102.44 mL/min (ref >60.00)
Glucose, Bld: 76 mg/dL (ref 70–99)
Potassium: 4.2 meq/L (ref 3.5–5.1)
Sodium: 138 meq/L (ref 135–145)

## 2023-08-27 LAB — TSH: TSH: 7.38 u[IU]/mL — ABNORMAL HIGH (ref 0.35–5.50)

## 2023-08-27 MED ORDER — LEVOTHYROXINE SODIUM 150 MCG PO TABS: 150.0000 ug | ORAL_TABLET | Freq: Every day | ORAL | 0 refills | Status: DC

## 2023-08-27 NOTE — Telephone Encounter (Signed)
 Platelets are stable.   Synthroid  needs to be increased to 150 mcg. Repeat TSH in 6 weeks.

## 2023-09-01 DIAGNOSIS — M47812 Spondylosis without myelopathy or radiculopathy, cervical region: Secondary | ICD-10-CM | POA: Diagnosis not present

## 2023-09-01 DIAGNOSIS — M47816 Spondylosis without myelopathy or radiculopathy, lumbar region: Secondary | ICD-10-CM | POA: Diagnosis not present

## 2023-09-01 DIAGNOSIS — G894 Chronic pain syndrome: Secondary | ICD-10-CM | POA: Diagnosis not present

## 2023-09-01 DIAGNOSIS — M961 Postlaminectomy syndrome, not elsewhere classified: Secondary | ICD-10-CM | POA: Diagnosis not present

## 2023-09-08 ENCOUNTER — Encounter (HOSPITAL_COMMUNITY): Payer: Self-pay | Admitting: Internal Medicine

## 2023-09-08 ENCOUNTER — Ambulatory Visit (HOSPITAL_COMMUNITY)
Admission: RE | Admit: 2023-09-08 | Discharge: 2023-09-08 | Disposition: A | Discharge status: Home / Self Care | Source: Ambulatory Visit | Attending: Internal Medicine | Admitting: Internal Medicine

## 2023-09-08 ENCOUNTER — Ambulatory Visit (HOSPITAL_COMMUNITY): Admitting: Internal Medicine

## 2023-09-08 VITALS — BP 108/60 | HR 64 | Ht 66.0 in | Wt 209.4 lb

## 2023-09-08 DIAGNOSIS — I480 Paroxysmal atrial fibrillation: Secondary | ICD-10-CM

## 2023-09-08 DIAGNOSIS — I4819 Other persistent atrial fibrillation: Secondary | ICD-10-CM

## 2023-09-08 DIAGNOSIS — D6869 Other thrombophilia: Secondary | ICD-10-CM | POA: Diagnosis not present

## 2023-09-08 NOTE — Progress Notes (Addendum)
 Primary Care Physician: Daryl Setter, NP Primary Cardiologist: Lamar Fitch, MD Electrophysiologist: Will [name], MD     Referring Physician: Dr. Norton Teressa Megan [name] Clayborn is a 58 y.o. female with a history of obesity post bariatric surgery, HFrEF, HTN, fatty liver due to autoimmune hepatitis, Hashimoto's thyroiditis, cirrhosis, and atrial fibrillation who presents for consultation in the [facility]. Patient is on Eliquis  5 mg BID for a CHADS2VASC score of 3.  On evaluation today, patient is currently in NSR. S/p Afib ablation on 08/11/23 by Dr. Norton. Discharged on 7/30 due to code stroke called after procedure due to left hand numbness. 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 Appt with ENT in two days. Head CT negative. Dr. Rosemarie noted likely TIA. No episodes of Afib since ablation. No chest pain or SOB. Leg sites healed without issue. No missed doses of anticoagulant.  Today, she denies symptoms of orthopnea, PND, lower extremity edema, dizziness, presyncope, syncope, snoring, daytime somnolence, bleeding, or neurologic sequela. The patient is tolerating medications without difficulties and is otherwise without complaint today.    she has a BMI of Body mass index is 33.8 kg/m.SABRA Filed Weights   09/08/23 1535  Weight: 95 kg    Current Outpatient Medications  Medication Sig Dispense Refill   acetaminophen  (TYLENOL ) 325 MG tablet Take 650 mg by mouth every 6 (six) hours as needed for moderate pain (pain score 4-6).     apixaban  (ELIQUIS ) 5 MG TABS tablet Take 1 tablet (5 mg total) by mouth 2 (two) times daily. 60 tablet 11   azaTHIOprine  (IMURAN ) 50 MG tablet Take 50 mg by mouth daily.     baclofen  (LIORESAL ) 10 MG tablet Take 5 mg by mouth at bedtime.     CALCIUM PO Take 1,300 mg by mouth daily. 650 mg each Chewable     levothyroxine  (SYNTHROID ) 150 MCG tablet Take 1 tablet (150 mcg total) by mouth daily. 90 tablet 0   Multiple  Vitamins-Iron (MULTIPLE VITAMIN/IRON PO) Take 1 tablet by mouth daily. Chewable     neomycin -polymyxin b-dexamethasone  (MAXITROL ) 3.5-10000-0.1 OINT Place 1 Application into both eyes at bedtime.     oxyCODONE  (OXY IR/ROXICODONE ) 5 MG immediate release tablet Take 5 mg by mouth every 6 (six) hours.     PRESCRIPTION MEDICATION Place 1 drop into the right eye 3 (three) times daily as needed. Sil-Ophtho Lubricant Eye drop     No current facility-administered medications for this encounter.    Atrial Fibrillation Management history:  Previous antiarrhythmic drugs: none Previous cardioversions: none Previous ablations: 08/11/23 Anticoagulation history: Eliquis    ROS- All systems are reviewed and negative except as per the HPI above.  Physical Exam: BP 108/60   Pulse 64   Ht 5' 6 (1.676 m)   Wt 95 kg   LMP 04/10/2013 Comment: tubel ligation  BMI 33.80 kg/m   GEN: Well nourished, well developed in no acute distress NECK: No JVD; No carotid bruits CARDIAC: Regular rate and rhythm, no murmurs, rubs, gallops RESPIRATORY:  Clear to auscultation without rales, wheezing or rhonchi  ABDOMEN: Soft, non-tender, non-distended EXTREMITIES:  No edema; No deformity   EKG today demonstrates  Vent. rate 64 BPM PR interval 178 ms QRS duration 102 ms QT/QTcB 410/422 ms P-R-T axes 49 34 47 Normal sinus rhythm Cannot rule out Anterior infarct , age undetermined Abnormal ECG When compared with ECG of 12-Aug-2023 05:31, Premature atrial complexes are no longer Present  Echo 05/01/22 demonstrated  1. Left ventricular ejection fraction, by estimation, is 40 to 45%. The  left ventricle has mildly decreased function. The left ventricle has no  regional wall motion abnormalities. Left ventricular diastolic parameters  are indeterminate.   2. Right ventricular systolic function is normal. The right ventricular  size is normal. There is normal pulmonary artery systolic pressure.   3. Left atrial  size was severely dilated.   4. The mitral valve is normal in structure. Mild mitral valve  regurgitation. No evidence of mitral stenosis.   5. The aortic valve is normal in structure. Aortic valve regurgitation is  not visualized. No aortic stenosis is present.   6. Aneurysm of the ascending aorta, measuring 41 mm.   7. The inferior vena cava is normal in size with greater than 50%  respiratory variability, suggesting right atrial pressure of 3 mmHg.    ASSESSMENT & PLAN CHA2DS2-VASc Score = 3  The patient's score is based upon: CHF History: 1 HTN History: 1 Diabetes History: 0 Stroke History: 0 Vascular Disease History: 0 Age Score: 0 Gender Score: 1       ASSESSMENT AND PLAN: Persistent Atrial Fibrillation (ICD10:  I48.19) The patient's CHA2DS2-VASc score is 3, indicating a 3.2% annual risk of stroke.   S/p Afib ablation on 08/11/23 by Dr. Inocencio.  Patient is currently in NSR. Due to likely TIA post ablation, this would increase risk score to total of 5.   Secondary Hypercoagulable State (ICD10:  D68.69) The patient is at significant risk for stroke/thromboembolism based upon her CHA2DS2-VASc Score of 3.  Continue Apixaban  (Eliquis ).  Continue Eliquis     Follow up with EP as scheduled.    Terra Pac, PA-C  [facility] [address] [place], [place] [address] [phone]

## 2023-09-10 ENCOUNTER — Other Ambulatory Visit (INDEPENDENT_AMBULATORY_CARE_PROVIDER_SITE_OTHER): Payer: Self-pay | Admitting: Otolaryngology

## 2023-09-10 ENCOUNTER — Encounter (INDEPENDENT_AMBULATORY_CARE_PROVIDER_SITE_OTHER): Payer: Self-pay | Admitting: Otolaryngology

## 2023-09-10 ENCOUNTER — Ambulatory Visit (INDEPENDENT_AMBULATORY_CARE_PROVIDER_SITE_OTHER): Admitting: Otolaryngology

## 2023-09-10 VITALS — BP 110/73 | HR 76

## 2023-09-10 DIAGNOSIS — E042 Nontoxic multinodular goiter: Secondary | ICD-10-CM | POA: Diagnosis not present

## 2023-09-10 DIAGNOSIS — K118 Other diseases of salivary glands: Secondary | ICD-10-CM | POA: Diagnosis not present

## 2023-09-10 DIAGNOSIS — D37039 Neoplasm of uncertain behavior of the major salivary glands, unspecified: Secondary | ICD-10-CM
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
## 2023-09-12 ENCOUNTER — Encounter: Payer: Self-pay | Admitting: Cardiology

## 2023-09-12 ENCOUNTER — Other Ambulatory Visit: Payer: Self-pay | Admitting: Cardiology

## 2023-09-15 ENCOUNTER — Encounter (HOSPITAL_COMMUNITY): Payer: Self-pay

## 2023-09-15 NOTE — Telephone Encounter (Signed)
 Prescription refill request for Eliquis  received. Indication:afib Last office visit:8/25 Scr:0.49  8/25 Age: 58 Weight:95  kg  Prescription refilled

## 2023-09-15 NOTE — Telephone Encounter (Signed)
 Ronalee Sharper, RN    09/15/23  6:53 AM Note Prescription refill request for Eliquis  received. Indication:afib Last office visit:8/25 Scr:0.49  8/25 Age: 57 Weight:95  kg   Prescription refilled

## 2023-09-15 NOTE — Progress Notes (Signed)
 Hughes Simmonds, MD  Hendry Speas Yes OK to proceed. No AC hold recommended for thyroid  Bx.  JM, MD       Previous Messages    ----- Message ----- From: Jordin Dambrosio Sent: 09/15/2023  11:17 AM EDT To: Simmonds Hughes, MD; Eleanor Mighty Subject: RE: US  FNA BIOPSY SALIVARY GLAND PAROTID GLA*  Patient is on Eliquis  and is not able to get off of it . Is she ok to continue for this procedure ? ----- Message ----- From: Hughes Simmonds, MD Sent: 09/14/2023   7:15 AM EDT To: Eleanor Mighty Subject: RE: US  FNA BIOPSY SALIVARY GLAND PAROTID GLA*  PROCEDURE / BIOPSY REVIEW Date: 09/14/23  Requested Biopsy site: R parotid Reason for request: Mass Imaging review: Best seen on MR Head  Decision: Approved Imaging modality to perform: Ultrasound Schedule with: No sedation / Local anesthetic Schedule for: Any VIR  Additional comments: @Schedulers . R parotid mass FNA, not core. Local.  Please contact me with questions, concerns, or if issue pertaining to this request arise.  Simmonds Hughes, MD [facility] ----- Message ----- From: Kawena Lyday Sent: 09/10/2023   4:25 PM EDT To: Lorimer Tiberio; Ir Procedure Requests Subject: US  FNA BIOPSY SALIVARY GLAND PAROTID GLAND E*  Procedure : US  FNA BIOPSY SALIVARY GLAND PAROTID GLAND EA ADDT'L LESION  Reason : right parotid mass Dx: Parotid mass [K11.8 (ICD-10-CM)]    History : MR brain w/o , CT angio head neck w/wo  Provider : Okey Burns, MD  Contact: [phone]   ---------- US  thyroid  ordered but not yet scheduled - is that needed first ?

## 2023-09-15 NOTE — Progress Notes (Signed)
 Hughes Simmonds, MD  Michaelene Setter PROCEDURE / BIOPSY REVIEW Date: 09/14/23  Requested Biopsy site: R parotid Reason for request: Mass Imaging review: Best seen on MR Head  Decision: Approved Imaging modality to perform: Ultrasound Schedule with: No sedation / Local anesthetic Schedule for: Any VIR  Additional comments: @Schedulers . R parotid mass FNA, not core. Local.  Please contact me with questions, concerns, or if issue pertaining to this request arise.  Simmonds Hughes, MD [facility]       Previous Messages    ----- Message ----- From: Tashawn Laswell Sent: 09/10/2023   4:25 PM EDT To: Pasty Manninen; Ir Procedure Requests Subject: US  FNA BIOPSY SALIVARY GLAND PAROTID GLAND E*  Procedure : US  FNA BIOPSY SALIVARY GLAND PAROTID GLAND EA ADDT'L LESION  Reason : right parotid mass Dx: Parotid mass [K11.8 (ICD-10-CM)]    History : MR brain w/o , CT angio head neck w/wo

## 2023-09-26 ENCOUNTER — Other Ambulatory Visit: Payer: Self-pay | Admitting: Family

## 2023-09-26 DIAGNOSIS — E039 Hypothyroidism, unspecified: Secondary | ICD-10-CM

## 2023-09-27 MED ORDER — SYNTHROID 150 MCG PO TABS: 150.0000 ug | ORAL_TABLET | Freq: Every day | ORAL | 0 refills | Status: DC

## 2023-09-29 DIAGNOSIS — M47816 Spondylosis without myelopathy or radiculopathy, lumbar region: Secondary | ICD-10-CM | POA: Diagnosis not present

## 2023-09-29 DIAGNOSIS — M961 Postlaminectomy syndrome, not elsewhere classified: Secondary | ICD-10-CM | POA: Diagnosis not present

## 2023-09-29 DIAGNOSIS — Z79891 Long term (current) use of opiate analgesic: Secondary | ICD-10-CM | POA: Diagnosis not present

## 2023-09-29 DIAGNOSIS — M47812 Spondylosis without myelopathy or radiculopathy, cervical region: Secondary | ICD-10-CM | POA: Diagnosis not present

## 2023-09-29 DIAGNOSIS — G894 Chronic pain syndrome: Secondary | ICD-10-CM | POA: Diagnosis not present

## 2023-10-14 ENCOUNTER — Ambulatory Visit (HOSPITAL_COMMUNITY)
Admission: RE | Admit: 2023-10-14 | Discharge: 2023-10-14 | Disposition: A | Discharge status: Home / Self Care | Source: Ambulatory Visit

## 2023-10-14 ENCOUNTER — Other Ambulatory Visit (HOSPITAL_COMMUNITY): Payer: Self-pay | Admitting: Otolaryngology

## 2023-10-14 ENCOUNTER — Other Ambulatory Visit: Payer: Self-pay | Admitting: Nurse Practitioner

## 2023-10-14 ENCOUNTER — Ambulatory Visit (HOSPITAL_COMMUNITY)
Admission: RE | Admit: 2023-10-14 | Discharge: 2023-10-14 | Disposition: A | Discharge status: Home / Self Care | Source: Ambulatory Visit | Attending: Otolaryngology | Admitting: Otolaryngology

## 2023-10-14 DIAGNOSIS — K118 Other diseases of salivary glands: Secondary | ICD-10-CM

## 2023-10-14 DIAGNOSIS — D849 Immunodeficiency, unspecified: Secondary | ICD-10-CM | POA: Diagnosis not present

## 2023-10-14 DIAGNOSIS — K7581 Nonalcoholic steatohepatitis (NASH): Secondary | ICD-10-CM | POA: Diagnosis not present

## 2023-10-14 DIAGNOSIS — K754 Autoimmune hepatitis: Secondary | ICD-10-CM

## 2023-10-14 DIAGNOSIS — E042 Nontoxic multinodular goiter: Secondary | ICD-10-CM | POA: Diagnosis not present

## 2023-10-14 DIAGNOSIS — D110 Benign neoplasm of parotid gland: Secondary | ICD-10-CM | POA: Insufficient documentation

## 2023-10-14 DIAGNOSIS — R634 Abnormal weight loss: Secondary | ICD-10-CM | POA: Diagnosis not present

## 2023-10-14 DIAGNOSIS — K7469 Other cirrhosis of liver: Secondary | ICD-10-CM | POA: Diagnosis not present

## 2023-10-14 DIAGNOSIS — D490 Neoplasm of unspecified behavior of digestive system: Secondary | ICD-10-CM | POA: Diagnosis not present

## 2023-10-14 MED ORDER — LIDOCAINE HCL 1 % IJ SOLN
INTRAMUSCULAR | Status: AC
Start: 2023-10-14 — End: 2023-10-14
  Filled 2023-10-14: qty 20

## 2023-10-14 NOTE — Procedures (Signed)
 Vascular and Interventional Radiology Procedure Note  Patient: Megan [name] [facility] DOB: 1965-10-02 Medical Record Number: [id number] Note Date/Time: 10/14/23 1:08 PM   Performing Physician: Thom Hall, MD Assistant(s): None  Diagnosis: Incidental R parotid mass on CT   Procedure: RIGHT PAROTID MASS FINE NEEDLE ASPIRATION BIOPSY  Anesthesia: Local Anesthetic Complications: None Estimated Blood Loss: Minimal Specimens: Sent for Cytology  Findings:  Successful Ultrasound-guided biopsy of a R parotid mass . A total of 5 samples were obtained. Hemostasis of the tract was achieved using Manual Pressure.  Plan: Bed rest for 0 hours.  See detailed procedure note with images in PACS. The patient tolerated the procedure well without incident or complication and was returned to Recovery in stable condition.    Thom Hall, MD [facility] Radiology   Pager. [phone] Clinic. [phone]

## 2023-10-15 ENCOUNTER — Ambulatory Visit: Admitting: Cardiology

## 2023-10-15 ENCOUNTER — Ambulatory Visit (INDEPENDENT_AMBULATORY_CARE_PROVIDER_SITE_OTHER): Payer: Self-pay

## 2023-10-15 NOTE — Telephone Encounter (Signed)
Spoke to patient regarding results. Patient understood.

## 2023-10-16 ENCOUNTER — Telehealth (INDEPENDENT_AMBULATORY_CARE_PROVIDER_SITE_OTHER): Payer: Self-pay

## 2023-10-16 ENCOUNTER — Ambulatory Visit (INDEPENDENT_AMBULATORY_CARE_PROVIDER_SITE_OTHER): Payer: Self-pay

## 2023-10-16 LAB — CYTOLOGY - NON PAP

## 2023-10-16 NOTE — Telephone Encounter (Signed)
 Spoke to patient regarding results. Patient understood. She said that it has been hurting since the biopsy when she chews and everything. Please advise.

## 2023-10-16 NOTE — Telephone Encounter (Signed)
 Pt called asking about results to the biopsy done this week. And wanted a phone call back about the results.

## 2023-10-19 ENCOUNTER — Ambulatory Visit: Admitting: Medical

## 2023-10-19 ENCOUNTER — Encounter (INDEPENDENT_AMBULATORY_CARE_PROVIDER_SITE_OTHER): Payer: Self-pay | Admitting: Otolaryngology

## 2023-10-19 ENCOUNTER — Encounter (INDEPENDENT_AMBULATORY_CARE_PROVIDER_SITE_OTHER): Payer: Self-pay

## 2023-10-19 ENCOUNTER — Ambulatory Visit (INDEPENDENT_AMBULATORY_CARE_PROVIDER_SITE_OTHER): Admitting: Otolaryngology

## 2023-10-19 ENCOUNTER — Telehealth: Payer: Self-pay

## 2023-10-19 VITALS — BP 97/65

## 2023-10-19 DIAGNOSIS — R0981 Nasal congestion: Secondary | ICD-10-CM | POA: Diagnosis not present

## 2023-10-19 DIAGNOSIS — Z87891 Personal history of nicotine dependence: Secondary | ICD-10-CM | POA: Diagnosis not present

## 2023-10-19 DIAGNOSIS — J310 Chronic rhinitis: Secondary | ICD-10-CM

## 2023-10-19 DIAGNOSIS — K118 Other diseases of salivary glands: Secondary | ICD-10-CM

## 2023-10-19 DIAGNOSIS — J3489 Other specified disorders of nose and nasal sinuses: Secondary | ICD-10-CM

## 2023-10-19 MED ORDER — MUPIROCIN CALCIUM 2 % EX CREA: 1.0000 | TOPICAL_CREAM | Freq: Two times a day (BID) | CUTANEOUS | 0 refills | Status: DC

## 2023-10-19 MED ORDER — SALINE SPRAY 0.65 % NA SOLN: 1.0000 | NASAL | 5 refills | Status: AC | PRN
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
## 2023-10-19 NOTE — Telephone Encounter (Signed)
   Pre-operative Risk Assessment    Patient Name: Megan [name]  DOB: 11-12-1965 MRN: [id number]   Date of last office visit: 09/08/23 [name] PA-C ([facility]), 05/11/23 WILL CAMNITZ, MD Date of next office visit: 11/09/23 [name] PA-C ([facility]), 12/28/23 LAMAR FITCH, MD   Request for Surgical Clearance    Procedure:  PAROTIDECTOMY  Date of Surgery:  Clearance TBD                                Surgeon:  DR OKEY Socks Group or Practice Name:  [facility] Phone number:  [phone] Fax number:  [phone]   Type of Clearance Requested:   - Medical  - Pharmacy:  Hold Apixaban  (Eliquis )     Type of Anesthesia:  General    Additional requests/questions:    SignedLucie DELENA Ku   10/19/2023, 5:48 PM

## 2023-10-21 NOTE — Telephone Encounter (Signed)
 Pharmacy please advise on holding Eliquis  prior to  PAROTIDECTOMY  scheduled for TBD. Last labs 08/26/2023. Thank you.

## 2023-10-26 NOTE — Telephone Encounter (Signed)
 Patient with diagnosis of afib on Eliquis  for anticoagulation.    Procedure: PAROTIDECTOMY  Date of procedure: 01/04/24   CHA2DS2-VASc Score = 5   This indicates a 7.2% annual risk of stroke. The patient's score is based upon: CHF History: 1 HTN History: 1 Diabetes History: 0 Stroke History: 2 Vascular Disease History: 0 Age Score: 0 Gender Score: 1      CrCl 106 ml/min Platelet count 124  S/p Afib ablation on 08/11/23, patient will be > 90 days out at time of procedure. Likely TIA post ablation (08/12/23). She will be about 5 months out from TIA at time of planned procedure.   Patient has not had an Afib/aflutter ablation in the last 3 months, DCCV within the last 4 weeks or a watchman implanted in the last 45 days   Guidelines say stroke/TIA within the last 3-6 months is high risk. Our policy says 6 months. Patient will be about 5 post post TIA when procedure is planned. With confirm with Dr. Barb if ok to hold 2 days and if procedure date can remain or needs to be pushed out.   **This guidance is not considered finalized until pre-operative APP has relayed final recommendations.**

## 2023-10-26 NOTE — Telephone Encounter (Signed)
 I will fax these notes to surgeon office to review the notes from DR. Camnitz.   Will ask the requesting office to please reply to our preop team as to if the procedure is urgent. Please see all notes.

## 2023-10-27 NOTE — Telephone Encounter (Signed)
 I will forward notes to preop APP to review if any further notes needed.

## 2023-10-30 ENCOUNTER — Encounter (HOSPITAL_COMMUNITY): Payer: Self-pay | Admitting: *Deleted

## 2023-11-09 ENCOUNTER — Ambulatory Visit (HOSPITAL_COMMUNITY): Admitting: Internal Medicine

## 2023-11-09 ENCOUNTER — Ambulatory Visit (HOSPITAL_COMMUNITY)
Admission: RE | Admit: 2023-11-09 | Discharge: 2023-11-09 | Disposition: A | Discharge status: Home / Self Care | Source: Ambulatory Visit | Attending: Internal Medicine | Admitting: Internal Medicine

## 2023-11-09 ENCOUNTER — Other Ambulatory Visit (HOSPITAL_BASED_OUTPATIENT_CLINIC_OR_DEPARTMENT_OTHER): Payer: Self-pay | Admitting: Family

## 2023-11-09 ENCOUNTER — Encounter (HOSPITAL_COMMUNITY): Payer: Self-pay | Admitting: Internal Medicine

## 2023-11-09 VITALS — BP 122/92 | HR 100 | Ht 66.0 in | Wt 207.6 lb

## 2023-11-09 DIAGNOSIS — Z1231 Encounter for screening mammogram for malignant neoplasm of breast: Secondary | ICD-10-CM

## 2023-11-09 DIAGNOSIS — I4819 Other persistent atrial fibrillation: Secondary | ICD-10-CM | POA: Diagnosis not present

## 2023-11-09 DIAGNOSIS — I480 Paroxysmal atrial fibrillation: Secondary | ICD-10-CM | POA: Diagnosis not present

## 2023-11-09 DIAGNOSIS — D6869 Other thrombophilia: Secondary | ICD-10-CM

## 2023-11-09 DIAGNOSIS — M47816 Spondylosis without myelopathy or radiculopathy, lumbar region: Secondary | ICD-10-CM | POA: Diagnosis not present

## 2023-11-09 DIAGNOSIS — G894 Chronic pain syndrome: Secondary | ICD-10-CM | POA: Diagnosis not present

## 2023-11-09 DIAGNOSIS — M961 Postlaminectomy syndrome, not elsewhere classified: Secondary | ICD-10-CM | POA: Diagnosis not present

## 2023-11-09 DIAGNOSIS — M47812 Spondylosis without myelopathy or radiculopathy, cervical region: Secondary | ICD-10-CM | POA: Diagnosis not present

## 2023-11-09 NOTE — Progress Notes (Signed)
 Primary Care Physician: Daryl Setter, NP Primary Cardiologist: Lamar Fitch, MD Electrophysiologist: Will [name], MD     Referring Physician: Dr. Norton Teressa VEAR Megan [name] is a 58 y.o. 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. Patient is on Eliquis  5 mg BID for a CHADS2VASC score of 3. S/p Afib ablation on 08/11/23 by Dr. Norton. Discharged on 7/30 due to code stroke called after procedure due to left hand numbness. 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 Appt with ENT in two days. Head CT negative. Dr. Rosemarie noted likely TIA. Patient scheduled 04/13/24 for excision of parotid gland.  On follow up 11/09/23, patient is currently in Afib. 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 She notes BP at home is ~98/60. No missed doses of Eliquis .   Today, she denies symptoms of orthopnea, PND, lower extremity edema, dizziness, presyncope, syncope, snoring, daytime somnolence, bleeding, or neurologic sequela. The patient is tolerating medications without difficulties and is otherwise without complaint today.    she has a BMI of Body mass index is 33.51 kg/m.SABRA Filed Weights   11/09/23 1538  Weight: 94.2 kg    Current Outpatient Medications  Medication Sig Dispense Refill   acetaminophen  (TYLENOL ) 325 MG tablet Take 650 mg by mouth every 6 (six) hours as needed for moderate pain (pain score 4-6).     azaTHIOprine  (IMURAN ) 50 MG tablet Take 50 mg by mouth daily.     baclofen  (LIORESAL ) 10 MG tablet Take 5 mg by mouth at bedtime.     CALCIUM PO Take 1,300 mg by mouth daily. 650 mg each Chewable     ELIQUIS  5 MG TABS tablet TAKE 1 TABLET BY MOUTH TWICE A DAY 60 tablet 11   Multiple Vitamins-Iron (MULTIPLE VITAMIN/IRON PO) Take 1 tablet  by mouth daily. Chewable     mupirocin  cream (BACTROBAN ) 2 % Apply 1 Application topically 2 (two) times daily. 15 g 0   neomycin -polymyxin b-dexamethasone  (MAXITROL ) 3.5-10000-0.1 OINT Place 1 Application into both eyes at bedtime.     oxyCODONE  (OXY IR/ROXICODONE ) 5 MG immediate release tablet Take 5 mg by mouth every 6 (six) hours.     PRESCRIPTION MEDICATION Place 1 drop into the right eye 3 (three) times daily as needed. Sil-Ophtho Lubricant Eye drop     sodium chloride  (OCEAN) 0.65 % SOLN nasal spray Place 1 spray into both nostrils as needed. 30 mL 5   SYNTHROID  150 MCG tablet Take 1 tablet (150 mcg total) by mouth daily before breakfast. 90 tablet 0   No current facility-administered medications for this encounter.    Atrial Fibrillation Management history:  Previous antiarrhythmic drugs: none Previous cardioversions: none Previous ablations: 08/11/23 Anticoagulation history: Eliquis    ROS- All systems are reviewed and negative except as per the HPI above.  Physical Exam: BP (!) 122/92   Pulse 100   Ht 5' 6 (1.676 m)   Wt 94.2 kg   LMP 04/10/2013 Comment: tubel ligation  BMI 33.51 kg/m   GEN- The patient is well appearing, alert and oriented x 3 today.   Neck - no JVD or carotid bruit noted Lungs- Clear to ausculation bilaterally, normal work of breathing Heart- Irregular rate and rhythm, no murmurs, rubs or gallops, PMI not laterally displaced Extremities- no clubbing, cyanosis,  or edema Skin - no rash or ecchymosis noted   EKG today demonstrates  Vent. rate 100 BPM PR interval * ms QRS duration 96 ms QT/QTcB 328/423 ms P-R-T axes * 42 26 Atrial fibrillation Cannot rule out Anterior infarct (cited on or before 08-Sep-2023) Abnormal ECG When compared with ECG of 08-Sep-2023 15:37, Atrial fibrillation has replaced Sinus rhythm  Echo 05/01/22 demonstrated  1. Left ventricular ejection fraction, by estimation, is 40 to 45%. The  left ventricle has mildly  decreased function. The left ventricle has no  regional wall motion abnormalities. Left ventricular diastolic parameters  are indeterminate.   2. Right ventricular systolic function is normal. The right ventricular  size is normal. There is normal pulmonary artery systolic pressure.   3. Left atrial size was severely dilated.   4. The mitral valve is normal in structure. Mild mitral valve  regurgitation. No evidence of mitral stenosis.   5. The aortic valve is normal in structure. Aortic valve regurgitation is  not visualized. No aortic stenosis is present.   6. Aneurysm of the ascending aorta, measuring 41 mm.   7. The inferior vena cava is normal in size with greater than 50%  respiratory variability, suggesting right atrial pressure of 3 mmHg.    ASSESSMENT & PLAN CHA2DS2-VASc Score = 5  The patient's score is based upon: CHF History: 1 HTN History: 1 Diabetes History: 0 Stroke History: 2 Vascular Disease History: 0 Age Score: 0 Gender Score: 1       ASSESSMENT AND PLAN: Persistent Atrial Fibrillation (ICD10:  I48.19) The patient's CHA2DS2-VASc score is 5, indicating a 7.2% annual risk of stroke.   S/p Afib ablation on 08/11/23 by Dr. Inocencio.  Patient is currently in Afib. We discussed the procedure cardioversion to try to convert to NSR. We discussed the risks vs benefits of this procedure and how ultimately we cannot predict whether a patient will have early return of arrhythmia post procedure. After discussion, the patient wishes to proceed with cardioversion. Labs drawn today. She wishes to cause the least amount of disruption to her grandchildren she is caring for and to this effect wants the cardioversion to be done right before Thanksgiving. We discussed rhythm control but patient declines Tikosyn and amiodarone. If patient has early return of Afib, she may need to discuss long term plan of care with Dr. Inocencio.   Informed Consent   Shared Decision Making/Informed  Consent The risks (stroke, cardiac arrhythmias rarely resulting in the need for a temporary or permanent pacemaker, skin irritation or burns and complications associated with conscious sedation including aspiration, arrhythmia, respiratory failure and death), benefits (restoration of normal sinus rhythm) and alternatives of a direct current cardioversion were explained in detail to Ms. Moosman and she agrees to proceed.      Secondary Hypercoagulable State (ICD10:  D68.69) The patient is at significant risk for stroke/thromboembolism based upon her CHA2DS2-VASc Score of 5.  Continue Apixaban  (Eliquis ).  Continue Eliquis  without interruption.     Follow up as scheduled with Dr. Krasowski.    Terra Pac, PA-C  Afib Clinic [facility] [address] [place], [place] [address] [phone]

## 2023-11-09 NOTE — Patient Instructions (Addendum)
 Cardioversion scheduled for: 12/08/24 Wednesday 6:30 am    - Arrive at the [facility] ([address])  and check in with ADMITTING at 6:30 am    - Do not eat or drink anything after midnight the night prior to your procedure.   - Take all your morning medication (except diabetic medications) with a sip of water prior to arrival.  - Do NOT miss any doses of your blood thinner - if you should miss a dose or take a dose more than 4 hours late -- please notify our office immediately.  - You will not be able to drive home after your procedure. Please ensure you have a responsible adult to drive you home. You will need someone with you for 24 hours post procedure.     - Expect to be in the procedural area approximately 2 hours.   - If you feel as if you go back into normal rhythm prior to scheduled cardioversion, please notify our office immediately.   If your procedure is canceled in the cardioversion suite you will be charged a cancellation fee.

## 2023-11-10 ENCOUNTER — Ambulatory Visit (HOSPITAL_COMMUNITY): Payer: Self-pay | Admitting: Internal Medicine

## 2023-11-10 LAB — BASIC METABOLIC PANEL WITH GFR
BUN/Creatinine Ratio: 32 — ABNORMAL HIGH (ref 12–28)
BUN: 19 mg/dL (ref 8–27)
CO2: 27 mmol/L (ref 20–29)
Calcium: 8.9 mg/dL (ref 8.7–10.3)
Chloride: 104 mmol/L (ref 96–106)
Creatinine, Ser: 0.59 mg/dL (ref 0.57–1.00)
Glucose: 83 mg/dL (ref 70–99)
Potassium: 3.9 mmol/L (ref 3.5–5.2)
Sodium: 142 mmol/L (ref 134–144)
eGFR: 103 mL/min/1.73 (ref >59)

## 2023-11-10 LAB — CBC
Hematocrit: 37.2 % (ref 34.0–46.6)
Hemoglobin: 12.2 g/dL (ref 11.1–15.9)
MCH: 29 pg (ref 26.6–33.0)
MCHC: 32.8 g/dL (ref 31.5–35.7)
MCV: 88 fL (ref 79–97)
Platelets: 134 x10E3/uL — ABNORMAL LOW (ref 150–450)
RBC: 4.21 x10E6/uL (ref 3.77–5.28)
RDW: 13 % (ref 11.7–15.4)
WBC: 3.7 x10E3/uL (ref 3.4–10.8)

## 2023-11-16 ENCOUNTER — Other Ambulatory Visit

## 2023-11-18 ENCOUNTER — Encounter: Payer: Self-pay | Admitting: Family

## 2023-11-20 NOTE — Telephone Encounter (Signed)
 TSH already in chart.

## 2023-11-20 NOTE — Telephone Encounter (Unsigned)
 Copied from CRM [id number]. Topic: Clinical - Request for Lab/Test Order >> Nov 20, 2023 11:05 AM China J wrote: Reason for CRM: TSH order needs to be placed for the patient as she has scheduled her lab for this Monday as instructed by Melissa.

## 2023-11-21 ENCOUNTER — Other Ambulatory Visit: Payer: Self-pay | Admitting: Family

## 2023-11-21 DIAGNOSIS — E039 Hypothyroidism, unspecified: Secondary | ICD-10-CM

## 2023-11-23 ENCOUNTER — Other Ambulatory Visit

## 2023-11-23 ENCOUNTER — Telehealth: Payer: Self-pay | Admitting: Family

## 2023-11-23 DIAGNOSIS — E039 Hypothyroidism, unspecified: Secondary | ICD-10-CM

## 2023-11-23 DIAGNOSIS — Z1231 Encounter for screening mammogram for malignant neoplasm of breast: Secondary | ICD-10-CM

## 2023-11-23 LAB — TSH: TSH: 4.64 u[IU]/mL (ref 0.35–5.50)

## 2023-11-23 NOTE — Telephone Encounter (Signed)
 pt states she needs her synthroid  150 mcg refilled to the [facility] on [address] in [place], [place].

## 2023-11-23 NOTE — Telephone Encounter (Signed)
 Refill was sent

## 2023-11-24 ENCOUNTER — Ambulatory Visit: Payer: Self-pay | Admitting: Family

## 2023-12-01 ENCOUNTER — Telehealth: Payer: Self-pay | Admitting: Family

## 2023-12-01 NOTE — Telephone Encounter (Signed)
 See mychart.

## 2023-12-02 ENCOUNTER — Ambulatory Visit: Admitting: Family

## 2023-12-06 ENCOUNTER — Encounter: Payer: Self-pay | Admitting: Cardiology

## 2023-12-08 ENCOUNTER — Telehealth: Payer: Self-pay | Admitting: Cardiology

## 2023-12-08 NOTE — Telephone Encounter (Signed)
 Canceled Cardioversion per pt request

## 2023-12-08 NOTE — Telephone Encounter (Signed)
 Patient stated she is still not well and has a respiratory illness.  Patient stated she wants to postpone her procedure.

## 2023-12-09 ENCOUNTER — Encounter (HOSPITAL_COMMUNITY): Admission: RE | Payer: Self-pay | Source: Home / Self Care

## 2023-12-09 ENCOUNTER — Ambulatory Visit (HOSPITAL_COMMUNITY): Admission: RE | Admit: 2023-12-09 | Source: Home / Self Care

## 2023-12-09 SURGERY — CARDIOVERSION (CATH LAB)
Anesthesia: General

## 2023-12-09 NOTE — Telephone Encounter (Signed)
 See above note, patient cancelled procedure.

## 2023-12-28 ENCOUNTER — Encounter: Payer: Self-pay | Admitting: Cardiology

## 2023-12-28 ENCOUNTER — Encounter: Payer: Self-pay | Admitting: *Deleted

## 2023-12-28 ENCOUNTER — Ambulatory Visit: Attending: Cardiology | Admitting: Cardiology

## 2023-12-28 ENCOUNTER — Ambulatory Visit

## 2023-12-28 VITALS — BP 120/90 | HR 87 | Ht 66.0 in | Wt 209.0 lb

## 2023-12-28 DIAGNOSIS — I10 Essential (primary) hypertension: Secondary | ICD-10-CM

## 2023-12-28 DIAGNOSIS — K118 Other diseases of salivary glands: Secondary | ICD-10-CM

## 2023-12-28 DIAGNOSIS — D849 Immunodeficiency, unspecified: Secondary | ICD-10-CM | POA: Diagnosis not present

## 2023-12-28 DIAGNOSIS — I480 Paroxysmal atrial fibrillation: Secondary | ICD-10-CM | POA: Diagnosis not present

## 2023-12-28 DIAGNOSIS — G459 Transient cerebral ischemic attack, unspecified: Secondary | ICD-10-CM | POA: Diagnosis not present
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
## 2023-12-28 NOTE — Patient Instructions (Signed)
Medication Instructions:  ?Your physician recommends that you continue on your current medications as directed. Please refer to the Current Medication list given to you today.  ?*If you need a refill on your cardiac medications before your next appointment, please call your pharmacy* ? ? ?Lab Work: ?None Ordered ?If you have labs (blood work) drawn today and your tests are completely normal, you will receive your results only by: ?MyChart Message (if you have MyChart) OR ?A paper copy in the mail ?If you have any lab test that is abnormal or we need to change your treatment, we will call you to review the results. ? ? ?Testing/Procedures: ? ?WHY IS MY DOCTOR PRESCRIBING ZIO? ?The Zio system is proven and trusted by physicians to detect and diagnose irregular heart rhythms -- and has been prescribed to hundreds of thousands of patients. ? ?The FDA has cleared the Zio system to monitor for many different kinds of irregular heart rhythms. In a study, physicians were able to reach a diagnosis 90% of the time with the Zio system1. ? ?You can wear the Zio monitor -- a small, discreet, comfortable patch -- during your normal day-to-day activity, including while you sleep, shower, and exercise, while it records every single heartbeat for analysis. ? ?1Barrett, P., et al. Comparison of 24 Hour Holter Monitoring Versus 14 Day Novel Adhesive Patch Electrocardiographic Monitoring. [place], 2014. ? ?ZIO VS. HOLTER MONITORING ?The Zio monitor can be comfortably worn for up to 14 days. Holter monitors can be worn for 24 to 48 hours, limiting the time to record any irregular heart rhythms you may have. Zio is able to capture data for the 51% of patients who have their first symptom-triggered arrhythmia after 48 hours.1 ? ?LIVE WITHOUT RESTRICTIONS ?The Zio ambulatory cardiac monitor is a small, unobtrusive, and water-resistant patch--you might even forget you?re wearing it. The Zio monitor records and stores  every beat of your heart, whether you're sleeping, working out, or showering.   ? ? ?Follow-Up: ?At [facility], you and your health needs are our priority.  As part of our continuing mission to provide you with exceptional heart care, we have created designated Provider Care Teams.  These Care Teams include your primary Cardiologist (physician) and Advanced Practice Providers (APPs -  Physician Assistants and Nurse Practitioners) who all work together to provide you with the care you need, when you need it. ? ?We recommend signing up for the patient portal called "MyChart".  Sign up information is provided on this After Visit Summary.  MyChart is used to connect with patients for Virtual Visits (Telemedicine).  Patients are able to view lab/test results, encounter notes, upcoming appointments, etc.  Non-urgent messages can be sent to your provider as well.   ?To learn more about what you can do with MyChart, go to [email or web address].   ? ?Your next appointment:   ?6 week(s) ? ?The format for your next appointment:   ?In Person ? ?Provider:   ?Jenne Campus, MD  ? ? ?Other Instructions ?NA  ?

## 2023-12-28 NOTE — Addendum Note (Signed)
 Addended by: ARLOA PLANAS D on: 12/28/2023 05:09 PM   Modules accepted: Orders

## 2024-01-04 ENCOUNTER — Encounter: Payer: Self-pay | Admitting: Family

## 2024-01-04 ENCOUNTER — Ambulatory Visit (HOSPITAL_BASED_OUTPATIENT_CLINIC_OR_DEPARTMENT_OTHER)
Admission: RE | Admit: 2024-01-04 | Discharge: 2024-01-04 | Disposition: A | Discharge status: Home / Self Care | Source: Ambulatory Visit | Attending: Family | Admitting: Family

## 2024-01-04 DIAGNOSIS — Z1231 Encounter for screening mammogram for malignant neoplasm of breast: Secondary | ICD-10-CM | POA: Insufficient documentation

## 2024-01-04 MED ORDER — CEPHALEXIN 500 MG PO CAPS: 500.0000 mg | ORAL_CAPSULE | Freq: Two times a day (BID) | ORAL | 0 refills | Status: AC

## 2024-01-05 ENCOUNTER — Ambulatory Visit (INDEPENDENT_AMBULATORY_CARE_PROVIDER_SITE_OTHER): Admitting: Family

## 2024-01-05 ENCOUNTER — Encounter: Payer: Self-pay | Admitting: Hematology & Oncology

## 2024-01-05 VITALS — BP 94/68 | HR 80 | Resp 16 | Ht 66.0 in | Wt 208.0 lb

## 2024-01-05 DIAGNOSIS — K754 Autoimmune hepatitis: Secondary | ICD-10-CM | POA: Diagnosis not present

## 2024-01-05 DIAGNOSIS — E039 Hypothyroidism, unspecified: Secondary | ICD-10-CM | POA: Diagnosis not present

## 2024-01-05 DIAGNOSIS — R202 Paresthesia of skin: Secondary | ICD-10-CM | POA: Diagnosis not present

## 2024-01-05 DIAGNOSIS — N3001 Acute cystitis with hematuria: Secondary | ICD-10-CM | POA: Insufficient documentation

## 2024-01-05 DIAGNOSIS — Z23 Encounter for immunization: Secondary | ICD-10-CM | POA: Diagnosis not present

## 2024-01-05 DIAGNOSIS — R319 Hematuria, unspecified: Secondary | ICD-10-CM | POA: Diagnosis not present

## 2024-01-05 DIAGNOSIS — M7989 Other specified soft tissue disorders: Secondary | ICD-10-CM

## 2024-01-05 DIAGNOSIS — I4891 Unspecified atrial fibrillation: Secondary | ICD-10-CM

## 2024-01-05 LAB — POC URINALSYSI DIPSTICK (AUTOMATED)
Bilirubin, UA: NEGATIVE
Glucose, UA: NEGATIVE
Ketones, UA: NEGATIVE
Leukocytes, UA: NEGATIVE
Nitrite, UA: NEGATIVE
Protein, UA: NEGATIVE
Spec Grav, UA: 1.01
Urobilinogen, UA: 0.2 U/dL
pH, UA: 6.5

## 2024-01-05 LAB — VITAMIN B12: Vitamin B-12: 264 pg/mL (ref 211–911)

## 2024-01-05 LAB — TSH: TSH: 5.16 u[IU]/mL (ref 0.35–5.50)

## 2024-01-05 NOTE — Assessment & Plan Note (Signed)
 She is followed by [name] NP.

## 2024-01-05 NOTE — Assessment & Plan Note (Addendum)
" °  Recent visible hematuria likely from irritation. Suspected partially treated UTI. - Sent urine culture. - Ordered repeat urinalysis in one month to ensure resolution of microscopic hematuria.  - Complete current course of keflex .  "

## 2024-01-05 NOTE — Assessment & Plan Note (Addendum)
" °  Persistent atrial fibrillation with previously ineffective cardioversion and ablation. Continues Eliquis . Considering pacemaker due to recurrent hospitalizations. - Continue current heart monitor per cardiology - Plans to discuss potential pacemaker placement with cardiologist.  "
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
## 2024-01-05 NOTE — Patient Instructions (Signed)
" °  VISIT SUMMARY: Today, you were seen for blood in your urine and concerns about your heart health. We discussed your recent symptoms, including visible blood in your urine, fatigue, shortness of breath, tingling in your hands, and foot swelling. We reviewed your current treatments and made plans for further evaluation and management of your conditions.  YOUR PLAN: -ACUTE CYSTITIS WITH HEMATURIA: Acute cystitis is a bladder infection that can cause blood in the urine. You likely have a partially treated urinary tract infection (UTI). We have sent your urine for culture and ordered a repeat urinalysis in one month. Please complete your current course of antibiotics.  -PAROXYSMAL ATRIAL FIBRILLATION: Paroxysmal atrial fibrillation is an irregular and often rapid heart rate that can cause poor blood flow. Your previous treatments have not been successful, so we are considering a pacemaker or defibrillator. Continue wearing your heart monitor and discuss these options with your cardiologist.  -FOOT SWELLING: You have persistent swelling in the bottom of your foot. We have referred you to a podiatrist for further evaluation.  -PARESTHESIA OF HANDS: Paresthesia refers to tingling or numbness in the hands. This may be related to carpal tunnel syndrome or other neurological issues. We have checked your B12 level and will consider carpal tunnel syndrome as a potential cause.  -HYPOTHYROIDISM: Hypothyroidism is a condition where your thyroid  gland does not produce enough hormones. We have checked your thyroid  function tests to monitor this condition.  -GENERAL HEALTH MAINTENANCE: We discussed flu and pneumonia vaccinations. You received a flu shot today and decided to wait on the pneumonia shot.  INSTRUCTIONS: Please follow up with a repeat urinalysis in one month. Continue wearing your heart monitor and discuss the potential for a pacemaker or defibrillator with your cardiologist. Complete your current  course of antibiotics. Follow up with a podiatrist for your foot swelling. We will review your B12 levels and thyroid  function tests once the results are available.                       "

## 2024-01-05 NOTE — Assessment & Plan Note (Signed)
 Continues synthroid, update TSH.

## 2024-01-06 LAB — URINE CULTURE
MICRO NUMBER:: 17391837
Result:: NO GROWTH
SPECIMEN QUALITY:: ADEQUATE

## 2024-01-08 ENCOUNTER — Ambulatory Visit: Payer: Self-pay | Admitting: Family

## 2024-01-08 NOTE — Telephone Encounter (Signed)
 Urine culture is negative for infection. She can finish up her antibiotics though.  B12 is low. Please begin b12 1000mcg IM weekly x 4 then once monthly.  Thyroid  testing is normal.

## 2024-01-11 ENCOUNTER — Encounter: Payer: Self-pay | Admitting: Hematology & Oncology

## 2024-01-11 ENCOUNTER — Ambulatory Visit
Admission: RE | Admit: 2024-01-11 | Discharge: 2024-01-11 | Disposition: A | Source: Ambulatory Visit | Attending: Nurse Practitioner

## 2024-01-11 DIAGNOSIS — K7581 Nonalcoholic steatohepatitis (NASH): Secondary | ICD-10-CM

## 2024-01-11 DIAGNOSIS — K7469 Other cirrhosis of liver: Secondary | ICD-10-CM

## 2024-01-11 DIAGNOSIS — K754 Autoimmune hepatitis: Secondary | ICD-10-CM

## 2024-01-21 ENCOUNTER — Encounter (INDEPENDENT_AMBULATORY_CARE_PROVIDER_SITE_OTHER): Admitting: Otolaryngology

## 2024-01-21 ENCOUNTER — Telehealth: Payer: Self-pay | Admitting: Cardiology

## 2024-01-21 NOTE — Telephone Encounter (Signed)
Calling with abnormal results. Please advise  

## 2024-01-21 NOTE — Telephone Encounter (Signed)
 Damien with I rhythm called to report pt wore monitor 12/15-12/28 AF burden 100% episode of rapid rate 190 bpm for 60 sec Strip #7 pg 25 6 runs VT \ Report online Will forward to EP triage to f/u.

## 2024-01-22 ENCOUNTER — Ambulatory Visit: Payer: Self-pay | Admitting: Cardiology

## 2024-01-22 DIAGNOSIS — I4891 Unspecified atrial fibrillation: Secondary | ICD-10-CM

## 2024-01-22 DIAGNOSIS — I480 Paroxysmal atrial fibrillation: Secondary | ICD-10-CM

## 2024-01-22 MED ORDER — METOPROLOL SUCCINATE ER 25 MG PO TB24: 25.0000 mg | ORAL_TABLET | Freq: Every day | ORAL | 3 refills | Status: AC

## 2024-02-01 ENCOUNTER — Encounter: Payer: Self-pay | Admitting: Hematology & Oncology

## 2024-02-02 ENCOUNTER — Encounter: Payer: Self-pay | Admitting: Family

## 2024-02-02 DIAGNOSIS — G894 Chronic pain syndrome: Secondary | ICD-10-CM

## 2024-02-02 DIAGNOSIS — I480 Paroxysmal atrial fibrillation: Secondary | ICD-10-CM

## 2024-02-02 DIAGNOSIS — K754 Autoimmune hepatitis: Secondary | ICD-10-CM

## 2024-02-08 ENCOUNTER — Ambulatory Visit (HOSPITAL_BASED_OUTPATIENT_CLINIC_OR_DEPARTMENT_OTHER)

## 2024-02-08 ENCOUNTER — Other Ambulatory Visit

## 2024-02-09 ENCOUNTER — Encounter: Payer: Self-pay | Admitting: Cardiology

## 2024-02-09 ENCOUNTER — Telehealth: Payer: Self-pay | Admitting: Cardiology

## 2024-02-09 NOTE — Telephone Encounter (Signed)
 I got her rescheduled. CB

## 2024-02-09 NOTE — Telephone Encounter (Signed)
 Pt can't make appt 1/28 due to weather. Pt only wants to see Dr. MARLA. She refused to reschedule to April due to needing to be seen to make a decision about a medication. Please advise.

## 2024-02-10 ENCOUNTER — Ambulatory Visit: Admitting: Cardiology

## 2024-02-18 ENCOUNTER — Other Ambulatory Visit: Payer: Self-pay | Admitting: Family

## 2024-02-18 DIAGNOSIS — E039 Hypothyroidism, unspecified: Secondary | ICD-10-CM

## 2024-03-07 ENCOUNTER — Ambulatory Visit (HOSPITAL_BASED_OUTPATIENT_CLINIC_OR_DEPARTMENT_OTHER)

## 2024-03-07 ENCOUNTER — Other Ambulatory Visit

## 2024-03-10 ENCOUNTER — Ambulatory Visit: Admitting: Cardiology

## 2024-03-22 ENCOUNTER — Ambulatory Visit (INDEPENDENT_AMBULATORY_CARE_PROVIDER_SITE_OTHER): Admitting: Otolaryngology

## 2024-04-13 ENCOUNTER — Ambulatory Visit (HOSPITAL_COMMUNITY): Admit: 2024-04-13 | Admitting: Otolaryngology

## 2024-04-13 SURGERY — EXCISION, PAROTID GLAND
Anesthesia: General | Laterality: Right

## 2024-04-19 ENCOUNTER — Encounter (INDEPENDENT_AMBULATORY_CARE_PROVIDER_SITE_OTHER): Admitting: Otolaryngology

## 2024-04-27 ENCOUNTER — Encounter (INDEPENDENT_AMBULATORY_CARE_PROVIDER_SITE_OTHER): Admitting: Otolaryngology

## 2024-07-06 ENCOUNTER — Ambulatory Visit: Admitting: Family
